# Patient Record
Sex: Male | Born: 1955
Health system: Southern US, Community
[De-identification: ages and names within clinical notes are randomized; demographics above are authoritative.]

## PROBLEM LIST (undated history)

## (undated) DIAGNOSIS — E039 Hypothyroidism, unspecified: Secondary | ICD-10-CM

## (undated) DIAGNOSIS — I499 Cardiac arrhythmia, unspecified: Secondary | ICD-10-CM

## (undated) DIAGNOSIS — R0600 Dyspnea, unspecified: Secondary | ICD-10-CM

## (undated) DIAGNOSIS — C61 Malignant neoplasm of prostate: Secondary | ICD-10-CM

## (undated) HISTORY — DX: Cardiac arrhythmia, unspecified: I49.9

## (undated) HISTORY — PX: LIGAMENT REPAIR: SHX5444

---

## 2000-01-19 ENCOUNTER — Encounter: Admission: RE | Admit: 2000-01-19 | Discharge: 2000-01-19 | Payer: Self-pay | Admitting: Sports Medicine

## 2000-01-22 ENCOUNTER — Encounter: Admission: RE | Admit: 2000-01-22 | Discharge: 2000-01-22 | Payer: Self-pay | Admitting: Family Medicine

## 2006-12-02 ENCOUNTER — Emergency Department (HOSPITAL_COMMUNITY): Admission: EM | Admit: 2006-12-02 | Discharge: 2006-12-02 | Payer: Self-pay | Admitting: Family Medicine

## 2008-07-02 ENCOUNTER — Telehealth: Payer: Self-pay | Admitting: Internal Medicine

## 2010-09-21 ENCOUNTER — Encounter: Payer: Self-pay | Admitting: Emergency Medicine

## 2010-09-30 NOTE — Progress Notes (Signed)
Summary: sleep meds.  Phone Note Call from Patient   Caller: Patient Call For: Dr. Kirtland Bouchard Summary of Call: Pt. wants Rx for sleep called to [facility] [address]......Marland KitchenPain management will not prescribe sleep meds. [phone] Initial call taken by: Lynann Beaver CMA,  July 02, 2008 4:41 PM  Follow-up for Phone Call        Is he our patient? Follow-up by: Gordy Savers  MD,  July 02, 2008 5:24 PM      Appended Document: sleep meds. LMTCB, does not appear to be an active pt, no records found for pt in EMR, no paper chart found either.

## 2014-02-19 ENCOUNTER — Emergency Department (HOSPITAL_COMMUNITY)
Admission: EM | Admit: 2014-02-19 | Discharge: 2014-02-20 | Disposition: A | Discharge status: Home / Self Care | Payer: No Typology Code available for payment source | Attending: Emergency Medicine | Admitting: Emergency Medicine

## 2014-02-19 ENCOUNTER — Encounter (HOSPITAL_COMMUNITY): Payer: Self-pay | Admitting: Emergency Medicine

## 2014-02-19 DIAGNOSIS — S4980XA Other specified injuries of shoulder and upper arm, unspecified arm, initial encounter: Secondary | ICD-10-CM | POA: Insufficient documentation

## 2014-02-19 DIAGNOSIS — S46909A Unspecified injury of unspecified muscle, fascia and tendon at shoulder and upper arm level, unspecified arm, initial encounter: Secondary | ICD-10-CM | POA: Insufficient documentation

## 2014-02-19 DIAGNOSIS — M25511 Pain in right shoulder: Secondary | ICD-10-CM

## 2014-02-19 DIAGNOSIS — Y9389 Activity, other specified: Secondary | ICD-10-CM | POA: Insufficient documentation

## 2014-02-19 DIAGNOSIS — Y92410 Unspecified street and highway as the place of occurrence of the external cause: Secondary | ICD-10-CM | POA: Insufficient documentation

## 2014-02-19 DIAGNOSIS — R52 Pain, unspecified: Secondary | ICD-10-CM | POA: Insufficient documentation

## 2014-02-19 MED ORDER — NAPROXEN 250 MG PO TABS
500.0000 mg | ORAL_TABLET | Freq: Once | ORAL | Status: AC
  Administered 2014-02-20: 500 mg via ORAL
  Filled 2014-02-19: qty 2

## 2014-02-19 MED ORDER — DIAZEPAM 5 MG PO TABS
5.0000 mg | ORAL_TABLET | Freq: Once | ORAL | Status: AC
  Administered 2014-02-20: 5 mg via ORAL
  Filled 2014-02-19: qty 1
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
## 2014-02-20 ENCOUNTER — Emergency Department (HOSPITAL_COMMUNITY): Payer: No Typology Code available for payment source

## 2014-02-20 MED ORDER — DIAZEPAM 5 MG PO TABS: 5.0000 mg | ORAL_TABLET | Freq: Two times a day (BID) | ORAL | Status: DC

## 2014-02-20 MED ORDER — NAPROXEN 500 MG PO TABS: 500.0000 mg | ORAL_TABLET | Freq: Two times a day (BID) | ORAL | Status: DC
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
## 2014-02-20 NOTE — ED Notes (Signed)
Declined W/C at D/C and was escorted to lobby by RN. 

## 2014-02-22 NOTE — ED Provider Notes (Signed)
Medical screening examination/treatment/procedure(s) were performed by non-physician practitioner and as supervising physician I was immediately available for consultation/collaboration.   EKG Interpretation None        Sharyon Cable, MD 02/22/14 [phone]

## 2019-02-19 ENCOUNTER — Other Ambulatory Visit: Payer: Self-pay

## 2019-02-19 ENCOUNTER — Inpatient Hospital Stay (HOSPITAL_COMMUNITY)
Admission: EM | Admit: 2019-02-19 | Discharge: 2019-02-23 | DRG: 029 | Disposition: A | Discharge status: Home / Self Care | Payer: Self-pay | Attending: Neurosurgery | Admitting: Neurosurgery

## 2019-02-19 ENCOUNTER — Inpatient Hospital Stay (HOSPITAL_COMMUNITY): Payer: Self-pay

## 2019-02-19 ENCOUNTER — Emergency Department (HOSPITAL_COMMUNITY): Payer: Self-pay

## 2019-02-19 ENCOUNTER — Encounter (HOSPITAL_COMMUNITY): Payer: Self-pay | Admitting: Emergency Medicine

## 2019-02-19 DIAGNOSIS — R338 Other retention of urine: Secondary | ICD-10-CM | POA: Diagnosis present

## 2019-02-19 DIAGNOSIS — I10 Essential (primary) hypertension: Secondary | ICD-10-CM | POA: Diagnosis present

## 2019-02-19 DIAGNOSIS — Z8042 Family history of malignant neoplasm of prostate: Secondary | ICD-10-CM

## 2019-02-19 DIAGNOSIS — N3289 Other specified disorders of bladder: Secondary | ICD-10-CM | POA: Diagnosis present

## 2019-02-19 DIAGNOSIS — C787 Secondary malignant neoplasm of liver and intrahepatic bile duct: Secondary | ICD-10-CM | POA: Diagnosis present

## 2019-02-19 DIAGNOSIS — R270 Ataxia, unspecified: Secondary | ICD-10-CM | POA: Diagnosis present

## 2019-02-19 DIAGNOSIS — M4802 Spinal stenosis, cervical region: Secondary | ICD-10-CM | POA: Diagnosis present

## 2019-02-19 DIAGNOSIS — R269 Unspecified abnormalities of gait and mobility: Secondary | ICD-10-CM | POA: Diagnosis present

## 2019-02-19 DIAGNOSIS — N189 Chronic kidney disease, unspecified: Secondary | ICD-10-CM

## 2019-02-19 DIAGNOSIS — C7951 Secondary malignant neoplasm of bone: Secondary | ICD-10-CM | POA: Diagnosis present

## 2019-02-19 DIAGNOSIS — N1330 Unspecified hydronephrosis: Secondary | ICD-10-CM | POA: Diagnosis present

## 2019-02-19 DIAGNOSIS — Z1159 Encounter for screening for other viral diseases: Secondary | ICD-10-CM

## 2019-02-19 DIAGNOSIS — D630 Anemia in neoplastic disease: Secondary | ICD-10-CM | POA: Diagnosis present

## 2019-02-19 DIAGNOSIS — Z79899 Other long term (current) drug therapy: Secondary | ICD-10-CM

## 2019-02-19 DIAGNOSIS — C7802 Secondary malignant neoplasm of left lung: Secondary | ICD-10-CM | POA: Diagnosis present

## 2019-02-19 DIAGNOSIS — C61 Malignant neoplasm of prostate: Secondary | ICD-10-CM | POA: Diagnosis present

## 2019-02-19 DIAGNOSIS — D72829 Elevated white blood cell count, unspecified: Secondary | ICD-10-CM | POA: Diagnosis present

## 2019-02-19 DIAGNOSIS — D649 Anemia, unspecified: Secondary | ICD-10-CM | POA: Diagnosis present

## 2019-02-19 DIAGNOSIS — N179 Acute kidney failure, unspecified: Secondary | ICD-10-CM | POA: Diagnosis present

## 2019-02-19 DIAGNOSIS — G893 Neoplasm related pain (acute) (chronic): Secondary | ICD-10-CM | POA: Diagnosis present

## 2019-02-19 DIAGNOSIS — Z419 Encounter for procedure for purposes other than remedying health state, unspecified: Secondary | ICD-10-CM

## 2019-02-19 DIAGNOSIS — G959 Disease of spinal cord, unspecified: Principal | ICD-10-CM | POA: Diagnosis present

## 2019-02-19 HISTORY — DX: Malignant neoplasm of prostate: C61

## 2019-02-19 LAB — DIFFERENTIAL
Abs Immature Granulocytes: 0.02 10*3/uL (ref 0.00–0.07)
Basophils Absolute: 0.1 10*3/uL (ref 0.0–0.1)
Basophils Relative: 1 %
Eosinophils Absolute: 0.4 10*3/uL (ref 0.0–0.5)
Eosinophils Relative: 6 %
Immature Granulocytes: 0 %
Lymphocytes Relative: 22 %
Lymphs Abs: 1.3 10*3/uL (ref 0.7–4.0)
Monocytes Absolute: 0.8 10*3/uL (ref 0.1–1.0)
Monocytes Relative: 13 %
Neutro Abs: 3.4 10*3/uL (ref 1.7–7.7)
Neutrophils Relative %: 58 %

## 2019-02-19 LAB — APTT: aPTT: 31 s (ref 24–36)

## 2019-02-19 LAB — CBC
HCT: 37 % — ABNORMAL LOW (ref 39.0–52.0)
Hemoglobin: 11.7 g/dL — ABNORMAL LOW (ref 13.0–17.0)
MCH: 29.3 pg (ref 26.0–34.0)
MCHC: 31.6 g/dL (ref 30.0–36.0)
MCV: 92.5 fL (ref 80.0–100.0)
Platelets: 222 10*3/uL (ref 150–400)
RBC: 4 MIL/uL — ABNORMAL LOW (ref 4.22–5.81)
RDW: 13.4 % (ref 11.5–15.5)
WBC: 5.9 10*3/uL (ref 4.0–10.5)
nRBC: 0 % (ref 0.0–0.2)

## 2019-02-19 LAB — COMPREHENSIVE METABOLIC PANEL WITH GFR
ALT: 23 U/L (ref 0–44)
AST: 30 U/L (ref 15–41)
Albumin: 3.4 g/dL — ABNORMAL LOW (ref 3.5–5.0)
Alkaline Phosphatase: 455 U/L — ABNORMAL HIGH (ref 38–126)
Anion gap: 9 (ref 5–15)
BUN: 19 mg/dL (ref 8–23)
CO2: 24 mmol/L (ref 22–32)
Calcium: 8.7 mg/dL — ABNORMAL LOW (ref 8.9–10.3)
Chloride: 106 mmol/L (ref 98–111)
Creatinine, Ser: 1.84 mg/dL — ABNORMAL HIGH (ref 0.61–1.24)
GFR calc Af Amer: 45 mL/min — ABNORMAL LOW
GFR calc non Af Amer: 38 mL/min — ABNORMAL LOW
Glucose, Bld: 174 mg/dL — ABNORMAL HIGH (ref 70–99)
Potassium: 3.8 mmol/L (ref 3.5–5.1)
Sodium: 139 mmol/L (ref 135–145)
Total Bilirubin: 0.9 mg/dL (ref 0.3–1.2)
Total Protein: 6.5 g/dL (ref 6.5–8.1)

## 2019-02-19 LAB — SEDIMENTATION RATE: Sed Rate: 32 mm/hr — ABNORMAL HIGH (ref 0–16)

## 2019-02-19 LAB — I-STAT CHEM 8, ED
BUN: 21 mg/dL (ref 8–23)
Calcium, Ion: 1.13 mmol/L — ABNORMAL LOW (ref 1.15–1.40)
Chloride: 106 mmol/L (ref 98–111)
Creatinine, Ser: 1.7 mg/dL — ABNORMAL HIGH (ref 0.61–1.24)
Glucose, Bld: 172 mg/dL — ABNORMAL HIGH (ref 70–99)
HCT: 38 % — ABNORMAL LOW (ref 39.0–52.0)
Hemoglobin: 12.9 g/dL — ABNORMAL LOW (ref 13.0–17.0)
Potassium: 3.8 mmol/L (ref 3.5–5.1)
Sodium: 140 mmol/L (ref 135–145)
TCO2: 27 mmol/L (ref 22–32)

## 2019-02-19 LAB — SARS CORONAVIRUS 2 BY RT PCR (HOSPITAL ORDER, PERFORMED IN ~~LOC~~ HOSPITAL LAB): SARS Coronavirus 2: NEGATIVE

## 2019-02-19 LAB — C-REACTIVE PROTEIN: CRP: 0.9 mg/dL (ref <1.0)

## 2019-02-19 LAB — PSA: Prostatic Specific Antigen: 270 ng/mL — ABNORMAL HIGH (ref 0.00–4.00)

## 2019-02-19 LAB — CBG MONITORING, ED: Glucose-Capillary: 76 mg/dL (ref 70–99)

## 2019-02-19 LAB — PROTIME-INR
INR: 1.2 (ref 0.8–1.2)
Prothrombin Time: 14.9 s (ref 11.4–15.2)

## 2019-02-19 MED ORDER — HYDROCODONE-ACETAMINOPHEN 5-325 MG PO TABS
1.0000 | ORAL_TABLET | ORAL | Status: DC | PRN
  Administered 2019-02-19 – 2019-02-20 (×2): 2 via ORAL
  Filled 2019-02-19: qty 2

## 2019-02-19 MED ORDER — LORAZEPAM 2 MG/ML IJ SOLN
1.0000 mg | Freq: Once | INTRAMUSCULAR | Status: AC
  Administered 2019-02-19: 1 mg via INTRAVENOUS
  Filled 2019-02-19: qty 1

## 2019-02-19 MED ORDER — SODIUM CHLORIDE 0.9 % IV SOLN: 250.0000 mL | INTRAVENOUS | Status: DC | PRN

## 2019-02-19 MED ORDER — ONDANSETRON HCL 4 MG/2ML IJ SOLN: 4.0000 mg | Freq: Four times a day (QID) | INTRAMUSCULAR | Status: DC | PRN

## 2019-02-19 MED ORDER — DEXAMETHASONE SODIUM PHOSPHATE 10 MG/ML IJ SOLN
10.0000 mg | Freq: Four times a day (QID) | INTRAMUSCULAR | Status: DC
  Administered 2019-02-19 – 2019-02-21 (×5): 10 mg via INTRAVENOUS
  Filled 2019-02-19 (×5): qty 1

## 2019-02-19 MED ORDER — SODIUM CHLORIDE 0.9% FLUSH
3.0000 mL | Freq: Two times a day (BID) | INTRAVENOUS | Status: DC
  Administered 2019-02-19: 3 mL via INTRAVENOUS

## 2019-02-19 MED ORDER — ACETAMINOPHEN 650 MG RE SUPP: 650.0000 mg | Freq: Four times a day (QID) | RECTAL | Status: DC | PRN

## 2019-02-19 MED ORDER — SODIUM CHLORIDE 0.9% FLUSH
3.0000 mL | Freq: Once | INTRAVENOUS | Status: AC
  Administered 2019-02-19: 3 mL via INTRAVENOUS

## 2019-02-19 MED ORDER — ACETAMINOPHEN 325 MG PO TABS: 650.0000 mg | ORAL_TABLET | Freq: Four times a day (QID) | ORAL | Status: DC | PRN

## 2019-02-19 MED ORDER — ONDANSETRON HCL 4 MG PO TABS: 4.0000 mg | ORAL_TABLET | Freq: Four times a day (QID) | ORAL | Status: DC | PRN

## 2019-02-19 MED ORDER — SODIUM CHLORIDE 0.9% FLUSH: 3.0000 mL | INTRAVENOUS | Status: DC | PRN

## 2019-02-19 MED ORDER — SODIUM CHLORIDE 0.9 % IV SOLN
INTRAVENOUS | Status: DC
  Administered 2019-02-19 – 2019-02-22 (×4): via INTRAVENOUS

## 2019-02-19 MED ORDER — LORAZEPAM 2 MG/ML IJ SOLN
1.0000 mg | Freq: Once | INTRAMUSCULAR | Status: AC
  Administered 2019-02-19: 10:00:00 1 mg via INTRAVENOUS
  Filled 2019-02-19: qty 1
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
## 2019-02-19 NOTE — ED Triage Notes (Signed)
Ativan 1mg  given per request of Pt. On arrival to Pt bed to give ativan the Pt responded the bar is open fill her up.

## 2019-02-19 NOTE — ED Triage Notes (Signed)
PT reported he needed to pee and sat down on the floor.

## 2019-02-19 NOTE — ED Triage Notes (Signed)
PT found sitting on floor . Pt A/O x4  . Pt assisted into bed.

## 2019-02-19 NOTE — ED Triage Notes (Signed)
PT sleeping soundly and name called multiple times. Pt informed he was going for MRI. Pt reported he needed more medicine for MRI. Pt reminded he was sound asleep and needed his name called for MRI.

## 2019-02-19 NOTE — Progress Notes (Signed)
Attempted to get pt for MRI, pt needs meds for claustrophobia. Rn to call when pt is ready.

## 2019-02-19 NOTE — ED Notes (Signed)
Kelsey RN 3W notified of patient refusing MRI without additional sedation meds. Dr Trenton Gammon paged and patient transported to 3W

## 2019-02-19 NOTE — H&P (Signed)
Chris Morales is an 63 y.o. male.   Chief Complaint: Neck pain and weakness HPI: 63 year old male presents with a 10-day-day history of neck pain.  The symptoms began without any precipitating event.  The patient notes that 3 days ago he began having electrical sensations shooting into his upper extremities left greater than right.  He has had some progressive weakness in his left upper extremity.  He has had difficulty with gait instability.  He has no bowel or bladder dysfunction.  He has no history of malignancy.  He has no other ongoing major medical problems.  History reviewed. No pertinent past medical history.  History reviewed. No pertinent surgical history.  No family history on file. Social History:  reports that he has never smoked. He has never used smokeless tobacco. He reports current alcohol use. He reports that he does not use drugs.  Allergies: No Known Allergies  (Not in a hospital admission)   Results for orders placed or performed during the hospital encounter of 02/19/19 (from the past 48 hour(s))  Protime-INR     Status: None   Collection Time: 02/19/19  7:12 AM  Result Value Ref Range   Prothrombin Time 14.9 11.4 - 15.2 seconds   INR 1.2 0.8 - 1.2    Comment: (NOTE) INR goal varies based on device and disease states. Performed at [facility], [place] [address], [place], [place] [address]   APTT     Status: None   Collection Time: 02/19/19  7:12 AM  Result Value Ref Range   aPTT 31 24 - 36 seconds    Comment: Performed at [facility] [address], [place], [place] [address]  CBC     Status: Abnormal   Collection Time: 02/19/19  7:12 AM  Result Value Ref Range   WBC 5.9 4.0 - 10.5 K/uL   RBC 4.00 (L) 4.22 - 5.81 MIL/uL   Hemoglobin 11.7 (L) 13.0 - 17.0 g/dL   HCT 37.0 (L) 39.0 - 52.0 %   MCV 92.5 80.0 - 100.0 fL   MCH 29.3 26.0 - 34.0 pg   MCHC 31.6 30.0 - 36.0 g/dL   RDW 13.4 11.5 - 15.5 %   Platelets 222 150 - 400 K/uL   nRBC 0.0 0.0 -  0.2 %    Comment: Performed at [facility], [place] [address] [place], [place] [address]  Differential     Status: None   Collection Time: 02/19/19  7:12 AM  Result Value Ref Range   Neutrophils Relative % 58 %   Neutro Abs 3.4 1.7 - 7.7 K/uL   Lymphocytes Relative 22 %   Lymphs Abs 1.3 0.7 - 4.0 K/uL   Monocytes Relative 13 %   Monocytes Absolute 0.8 0.1 - 1.0 K/uL   Eosinophils Relative 6 %   Eosinophils Absolute 0.4 0.0 - 0.5 K/uL   Basophils Relative 1 %   Basophils Absolute 0.1 0.0 - 0.1 K/uL   Immature Granulocytes 0 %   Abs Immature Granulocytes 0.02 0.00 - 0.07 K/uL    Comment: Performed at [facility], [place] [address] [place],  [address]  Comprehensive metabolic panel     Status: Abnormal   Collection Time: 02/19/19  7:12 AM  Result Value Ref Range   Sodium 139 135 - 145 mmol/L   Potassium 3.8 3.5 - 5.1 mmol/L   Chloride 106 98 - 111 mmol/L   CO2 24 22 - 32 mmol/L   Glucose,  Bld 174 (H) 70 - 99 mg/dL   BUN 19 8 - 23 mg/dL   Creatinine, Ser 1.84 (H) 0.61 - 1.24 mg/dL   Calcium 8.7 (L) 8.9 - 10.3 mg/dL   Total Protein 6.5 6.5 - 8.1 g/dL   Albumin 3.4 (L) 3.5 - 5.0 g/dL   AST 30 15 - 41 U/L   ALT 23 0 - 44 U/L   Alkaline Phosphatase 455 (H) 38 - 126 U/L   Total Bilirubin 0.9 0.3 - 1.2 mg/dL   GFR calc non Af Amer 38 (L) >60 mL/min   GFR calc Af Amer 45 (L) >60 mL/min   Anion gap 9 5 - 15    Comment: Performed at [facility] [address], [place], [place] [address]  I-stat chem 8, ED     Status: Abnormal   Collection Time: 02/19/19  7:21 AM  Result Value Ref Range   Sodium 140 135 - 145 mmol/L   Potassium 3.8 3.5 - 5.1 mmol/L   Chloride 106 98 - 111 mmol/L   BUN 21 8 - 23 mg/dL   Creatinine, Ser 1.70 (H) 0.61 - 1.24 mg/dL   Glucose, Bld 172 (H) 70 - 99 mg/dL   Calcium, Ion 1.13 (L) 1.15 - 1.40 mmol/L   TCO2 27 22 - 32 mmol/L   Hemoglobin 12.9 (L) 13.0 - 17.0 g/dL   HCT 38.0 (L) 39.0 - 52.0 %  CBG monitoring, ED     Status: None    Collection Time: 02/19/19 12:06 PM  Result Value Ref Range   Glucose-Capillary 76 70 - 99 mg/dL   Ct Head Wo Contrast  Result Date: 02/19/2019 CLINICAL DATA:  63 year old and possible stroke. EXAM: CT HEAD WITHOUT CONTRAST TECHNIQUE: Contiguous axial images were obtained from the base of the skull through the vertex without intravenous contrast. COMPARISON:  06/18/2014 FINDINGS: Brain: No evidence of acute infarction, hemorrhage, hydrocephalus, extra-axial collection or mass lesion/mass effect. Vascular: No hyperdense vessel or unexpected calcification. Skull: Normal. Negative for fracture or focal lesion. Sinuses/Orbits: Mucosal disease in the right maxillary sinus which is partially imaged. Other: None IMPRESSION: No acute intracranial abnormality. Right maxillary sinus disease. Electronically Signed   By: Markus Daft M.D.   On: 02/19/2019 08:24   Mr Brain Wo Contrast (neuro Protocol)  Result Date: 02/19/2019 CLINICAL DATA:  Focal neuro deficit greater than 6 hours. Suspect stroke. EXAM: MRI HEAD WITHOUT CONTRAST TECHNIQUE: Multiplanar, multiecho pulse sequences of the brain and surrounding structures were obtained without intravenous contrast. COMPARISON:  CT head 02/19/2019 FINDINGS: Brain: Image quality degraded by motion. Negative for acute or chronic infarction. Ventricle size normal. Negative for mass or edema. Negative for hemorrhage or fluid collection. Vascular: Normal arterial flow voids Skull and upper cervical spine: Normal skull. Abnormal bone marrow throughout the C4 vertebral body. C2 and C3 vertebral bodies normal. Sinuses/Orbits: Mucosal edema paranasal sinuses.  Normal orbit Other: None IMPRESSION: Image quality degraded by motion Negative MRI of the brain without contrast Diffusely abnormal bone marrow at C4.  Possible tumor involvement. Electronically Signed   By: Franchot Gallo M.D.   On: 02/19/2019 11:06   Mr Cervical Spine Wo Contrast  Result Date: 02/19/2019 CLINICAL DATA:   Neck pain. Radiculopathy with arm pain and weakness. EXAM: MRI CERVICAL SPINE WITHOUT CONTRAST TECHNIQUE: Multiplanar, multisequence MR imaging of the cervical spine was performed. No intravenous contrast was administered. COMPARISON:  None. FINDINGS: Alignment: Image quality degraded by significant motion. Normal alignment.  Cervical kyphosis. Vertebrae: Negative for  fracture. Diffuse bone marrow abnormality in the cervical spine. The vertebral bodies show low signal on T1 and T2 from C4 through T2 with similar changes in the posterior elements. There is also similar change in the posterior elements but not vertebral body of C3. Cord: Cord evaluation limited by motion however there is possible cord edema throughout the cervical spine from approximately C4 through C6. Posterior Fossa, vertebral arteries, paraspinal tissues: Negative Disc levels: C2-3: Negative C3-4: Mild spinal stenosis. Disc degeneration and diffuse uncinate spurring. Moderate foraminal narrowing bilaterally C4-5: Moderate spinal stenosis. Disc degeneration and spurring. Moderate foraminal stenosis bilaterally C5-6: Disc degeneration and spondylosis. Moderate left foraminal stenosis and mild-to-moderate spinal stenosis C6-7: Disc degeneration and spondylosis. Mild spinal stenosis and mild foraminal stenosis bilaterally C7-T1: Moderate disc degeneration and spurring. Moderate foraminal stenosis bilaterally. Mild spinal stenosis. IMPRESSION: 1. Image quality degraded by significant motion. 2. 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 3. Cord evaluation limited due to motion however there is suggestion of cord edema throughout much of the cervical spine from C4 through C6. This could be due to myelopathy 4. Diffusely abnormal bone marrow throughout the vertebral bodies and posterior elements from C4-T2. While this pattern could be seen due to advanced disc  degeneration, further evaluation for infiltrating neoplastic process such is lymphoma or metastatic disease is warranted. 5. Given the motion degraded study, consider follow-up CT cervical spine. Also, would suggest a follow-up MRI of the cervical spine without with contrast, possibly with sedation, in the near future when the patient is able to hold still. 6. These results were called by telephone at the time of interpretation on 02/19/2019 at 11:20 am to Dr. Charlesetta Shanks , who verbally acknowledged these results. Electronically Signed   By: Franchot Gallo M.D.   On: 02/19/2019 11:22    Pertinent items noted in HPI and remainder of comprehensive ROS otherwise negative.  Blood pressure (!) 151/97, pulse 72, temperature 98.4 F (36.9 C), temperature source Oral, resp. rate 11, height 6\' 1"  (1.854 m), weight 73.9 kg, SpO2 98 %.  The patient is awake and alert.  His affect is somewhat flat and he seems somewhat dissociated from what is going on.  His speech is fluent.  His cranial nerve function is normal bilaterally.  He has significant weakness in both upper extremities left greater than right.  His grip strength is 4-/5 on the left his intrinsic strength is 4-/5 on the left.  He has some proximal weakness involving his deltoid muscle group and biceps muscle group on the left.  He has weakness in both lower extremities with some spasticity.  He has very poor heel shin testing bilaterally.  Sensory examination reveals decreased sensation pinprick and light touch diffusely in both upper extremities around the C5 level.  Reflexes are brisk.  He has Hoffmann's responses in his hands bilaterally.  Toes are upgoing to plantar stimulation.  Examination head ears eyes nose throat is unremarked.  Chest and abdomen are benign appearing.  Extremities are free from injury or deformity. Assessment/Plan Patient has evidence of rather profound cervical myelopathy.  MRI scanning is somewhat motion degraded but the patient  does have evidence of very severe disc degeneration at C3-4, C4-5, C5-6, C6-7 and C7-T1.  There is severe multifactorial stenosis at C3-4, C4-5 and C5-6.  There is an area of high signal in the posterior epidural spaceBehind the body of C5 which is poorly characterized on noncontrasted imaging.  Question possible epidural hematoma versus vascular abnormality versus abscess.  Patient requires further work-up to better characterize what is going on within his cervical spine.  He needs to have an MRI scan with contrast as well as a CT scan of his cervical spine.  We will check inflammatory markers to evaluate for possible epidural abscess/osteomyelitis as there is also signal abnormality within his vertebral bodies.  Mallie Mussel A Savreen Gebhardt 02/19/2019, 1:06 PM

## 2019-02-19 NOTE — ED Triage Notes (Signed)
Pt reports a "crick" in his neck for the last week or more. Pt reports he was moving his head and neck around, lost his balance and caught himself, and then felt a shock and a tingling type pain. Pt reports this pain occurs as well when he is coughing. Pt reports the tingling pain occurs intermittently over his entire body and different times.

## 2019-02-19 NOTE — Progress Notes (Signed)
Patient arrived to 3w. A&O x4. Skin intact with abrasion on back of neck. Tele has been placed. Patient oriented to room with call bell in reach. Patient very unsteady on his feet, bed alarm turned on. Nurse will continue to monitor. [place]

## 2019-02-19 NOTE — ED Provider Notes (Addendum)
[facility] EMERGENCY DEPARTMENT Provider Note   CSN: [id number] Arrival date & time: 02/19/19  9675    History   Chief Complaint No chief complaint on file.   HPI Chris Morales is a 63 y.o. male.     HPI Reports that 1 week ago he woke up and thought he had a crick in his neck on the left side.  The area was somewhat stiff and uncomfortable but at onset he did not have weakness or numbness into his extremities.  He waited several days and then tried some ibuprofen.  About 3 days ago he started to experience a electrical-like sensation that was coming in his arms.  He reports it would happen in both arms but seem to be more so in the left.  Since then he has also begun to have weakness in the left hand.  He reports he cannot grip is strongly.  Also he is noted that there seems to be a slight incoordination or increased awareness of needing to coordinate his gait.  He reports he has been up and walking around without difficulty but it has felt a little bit more incoordinated.  He never had a headache in association with any of these episodes or symptoms.  No blurred vision or double vision.  No periods of confusion.  No periods of difficulty with speech.  No prior history of stroke or spine dysfunction.  No history of spine surgery or known disc herniations.  He reports otherwise he has been well without fevers chills cough or other constitutional type symptoms.  Patient does not smoke.  No drug or alcohol use. History reviewed. No pertinent past medical history.  Patient Active Problem List   Diagnosis Date Noted  . Cervical myelopathy ([place]) 02/19/2019    History reviewed. No pertinent surgical history.      Home Medications    Prior to Admission medications   Medication Sig Start Date End Date Taking? Authorizing Provider  diazepam (VALIUM) 5 MG tablet Take 1 tablet (5 mg total) by mouth 2 (two) times daily. 02/20/14   Cleatrice Burke, PA-C  naproxen (NAPROSYN)  500 MG tablet Take 1 tablet (500 mg total) by mouth 2 (two) times daily with a meal. 02/20/14   Cleatrice Burke, PA-C    Family History No family history on file.  Social History Social History   Tobacco Use  . Smoking status: Never Smoker  . Smokeless tobacco: Never Used  Substance Use Topics  . Alcohol use: Yes  . Drug use: No     Allergies   Patient has no known allergies.   Review of Systems Review of Systems 10 Systems reviewed and are negative for acute change except as noted in the HPI.   Physical Exam Updated Vital Signs BP (!) 151/97   Pulse 72   Temp 98.4 F (36.9 C) (Oral)   Resp 11   Ht 6\' 1"  (1.854 m)   Wt 73.9 kg   SpO2 98%   BMI 21.51 kg/m   Physical Exam Constitutional:      Appearance: Normal appearance. He is well-developed.     Comments: Mental status is clear.  Clinically well in appearance.  Thin but well-nourished and well-developed.  HENT:     Head: Normocephalic and atraumatic.     Nose: Nose normal.     Mouth/Throat:     Mouth: Mucous membranes are moist.     Pharynx: Oropharynx is clear.  Eyes:     Extraocular Movements:  Extraocular movements intact.     Pupils: Pupils are equal, round, and reactive to light.  Neck:     Musculoskeletal: Neck supple.  Cardiovascular:     Rate and Rhythm: Normal rate.     Heart sounds: Normal heart sounds.     Comments: Occasional ectopy.  On monitor consistent with sinus arrhythmia. Pulmonary:     Effort: Pulmonary effort is normal.     Breath sounds: Normal breath sounds.  Abdominal:     General: Bowel sounds are normal. There is no distension.     Palpations: Abdomen is soft.     Tenderness: There is no abdominal tenderness.  Musculoskeletal: Normal range of motion.        General: No swelling or tenderness.     Right lower leg: No edema.     Left lower leg: No edema.  Skin:    General: Skin is warm and dry.  Neurological:     Mental Status: He is alert and oriented to person, place,  and time.     GCS: GCS eye subscore is 4. GCS verbal subscore is 5. GCS motor subscore is 6.     Coordination: Coordination normal.     Comments: Patient has decreased grip strength on the left 3\5 relative to the right 5\5.  Cranial nerves II through XII intact.  Lower extremity strength for elevation and holding against resistance 5\5.  Station intact light touch x4 extremities.  After completion of MRI and results, ambulation attempted.  Patient has severely ataxic gait does not appear steady for any independent ambulation.  Psychiatric:        Mood and Affect: Mood normal.      ED Treatments / Results  Labs (all labs ordered are listed, but only abnormal results are displayed) Labs Reviewed  CBC - Abnormal; Notable for the following components:      Result Value   RBC 4.00 (*)    Hemoglobin 11.7 (*)    HCT 37.0 (*)    All other components within normal limits  COMPREHENSIVE METABOLIC PANEL - Abnormal; Notable for the following components:   Glucose, Bld 174 (*)    Creatinine, Ser 1.84 (*)    Calcium 8.7 (*)    Albumin 3.4 (*)    Alkaline Phosphatase 455 (*)    GFR calc non Af Amer 38 (*)    GFR calc Af Amer 45 (*)    All other components within normal limits  I-STAT CHEM 8, ED - Abnormal; Notable for the following components:   Creatinine, Ser 1.70 (*)    Glucose, Bld 172 (*)    Calcium, Ion 1.13 (*)    Hemoglobin 12.9 (*)    HCT 38.0 (*)    All other components within normal limits  PROTIME-INR  APTT  DIFFERENTIAL  HIV ANTIBODY (ROUTINE TESTING W REFLEX)  SEDIMENTATION RATE  C-REACTIVE PROTEIN  CBG MONITORING, ED  CBG MONITORING, ED    EKG EKG Interpretation  Date/Time:  Sunday February 19 2019 07:07:42 EDT Ventricular Rate:  85 PR Interval:    QRS Duration: 100 QT Interval:  393 QTC Calculation: 462 R Axis:   59 Text Interpretation:  Unknown rhythm, irregular rate Minimal ST elevation, anterior leads Baseline wander in lead(s) V6 sinus arythmia, no ischemic  chnages, no old comparison Confirmed by Charlesetta Shanks [phone]) on 02/19/2019 10:51:13 AM   Radiology Ct Head Wo Contrast  Result Date: 02/19/2019 CLINICAL DATA:  63 year old and possible stroke. EXAM: CT HEAD WITHOUT CONTRAST TECHNIQUE:  Contiguous axial images were obtained from the base of the skull through the vertex without intravenous contrast. COMPARISON:  06/18/2014 FINDINGS: Brain: No evidence of acute infarction, hemorrhage, hydrocephalus, extra-axial collection or mass lesion/mass effect. Vascular: No hyperdense vessel or unexpected calcification. Skull: Normal. Negative for fracture or focal lesion. Sinuses/Orbits: Mucosal disease in the right maxillary sinus which is partially imaged. Other: None IMPRESSION: No acute intracranial abnormality. Right maxillary sinus disease. Electronically Signed   By: Markus Daft M.D.   On: 02/19/2019 08:24   Mr Brain Wo Contrast (neuro Protocol)  Result Date: 02/19/2019 CLINICAL DATA:  Focal neuro deficit greater than 6 hours. Suspect stroke. EXAM: MRI HEAD WITHOUT CONTRAST TECHNIQUE: Multiplanar, multiecho pulse sequences of the brain and surrounding structures were obtained without intravenous contrast. COMPARISON:  CT head 02/19/2019 FINDINGS: Brain: Image quality degraded by motion. Negative for acute or chronic infarction. Ventricle size normal. Negative for mass or edema. Negative for hemorrhage or fluid collection. Vascular: Normal arterial flow voids Skull and upper cervical spine: Normal skull. Abnormal bone marrow throughout the C4 vertebral body. C2 and C3 vertebral bodies normal. Sinuses/Orbits: Mucosal edema paranasal sinuses.  Normal orbit Other: None IMPRESSION: Image quality degraded by motion Negative MRI of the brain without contrast Diffusely abnormal bone marrow at C4.  Possible tumor involvement. Electronically Signed   By: Franchot Gallo M.D.   On: 02/19/2019 11:06   Mr Cervical Spine Wo Contrast  Addendum Date: 02/19/2019   ADDENDUM  REPORT: 02/19/2019 13:07 ADDENDUM: After further review and discussion with Dr. Trenton Gammon, there appears to be a posterior epidural process at the C5 bilaterally. This appears to be contributing to spinal stenosis. This has increased signal on T1 and decreased signal intensity on T2. Possible hemorrhage or abscess. MRI cervical spine without with contrast preferably with sedation would be helpful for further evaluation. Also consider CT cervical spine. Electronically Signed   By: Franchot Gallo M.D.   On: 02/19/2019 13:07   Result Date: 02/19/2019 CLINICAL DATA:  Neck pain. Radiculopathy with arm pain and weakness. EXAM: MRI CERVICAL SPINE WITHOUT CONTRAST TECHNIQUE: Multiplanar, multisequence MR imaging of the cervical spine was performed. No intravenous contrast was administered. COMPARISON:  None. FINDINGS: Alignment: Image quality degraded by significant motion. Normal alignment.  Cervical kyphosis. Vertebrae: Negative for fracture. Diffuse bone marrow abnormality in the cervical spine. The vertebral bodies show low signal on T1 and T2 from C4 through T2 with similar changes in the posterior elements. There is also similar change in the posterior elements but not vertebral body of C3. Cord: Cord evaluation limited by motion however there is possible cord edema throughout the cervical spine from approximately C4 through C6. Posterior Fossa, vertebral arteries, paraspinal tissues: Negative Disc levels: C2-3: Negative C3-4: Mild spinal stenosis. Disc degeneration and diffuse uncinate spurring. Moderate foraminal narrowing bilaterally C4-5: Moderate spinal stenosis. Disc degeneration and spurring. Moderate foraminal stenosis bilaterally C5-6: Disc degeneration and spondylosis. Moderate left foraminal stenosis and mild-to-moderate spinal stenosis C6-7: Disc degeneration and spondylosis. Mild spinal stenosis and mild foraminal stenosis bilaterally C7-T1: Moderate disc degeneration and spurring. Moderate foraminal  stenosis bilaterally. Mild spinal stenosis. IMPRESSION: 1. Image quality degraded by significant motion. 2. 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 3. Cord evaluation limited due to motion however there is suggestion of cord edema throughout much of the cervical spine from C4 through C6. This could be due to myelopathy 4. Diffusely abnormal bone marrow throughout the vertebral bodies and  posterior elements from C4-T2. While this pattern could be seen due to advanced disc degeneration, further evaluation for infiltrating neoplastic process such is lymphoma or metastatic disease is warranted. 5. Given the motion degraded study, consider follow-up CT cervical spine. Also, would suggest a follow-up MRI of the cervical spine without with contrast, possibly with sedation, in the near future when the patient is able to hold still. 6. These results were called by telephone at the time of interpretation on 02/19/2019 at 11:20 am to Dr. Charlesetta Shanks , who verbally acknowledged these results. Electronically Signed: By: Franchot Gallo M.D. On: 02/19/2019 11:22    Procedures Procedures (including critical care time)  Medications Ordered in ED Medications  sodium chloride flush (NS) 0.9 % injection 3 mL (has no administration in time range)  sodium chloride flush (NS) 0.9 % injection 3 mL (has no administration in time range)  sodium chloride flush (NS) 0.9 % injection 3 mL (has no administration in time range)  0.9 %  sodium chloride infusion (has no administration in time range)  acetaminophen (TYLENOL) tablet 650 mg (has no administration in time range)    Or  acetaminophen (TYLENOL) suppository 650 mg (has no administration in time range)  HYDROcodone-acetaminophen (NORCO/VICODIN) 5-325 MG per tablet 1-2 tablet (has no administration in time range)  ondansetron (ZOFRAN) tablet 4 mg (has no administration in time range)     Or  ondansetron (ZOFRAN) injection 4 mg (has no administration in time range)  dexamethasone (DECADRON) injection 10 mg (has no administration in time range)  sodium chloride flush (NS) 0.9 % injection 3 mL (3 mLs Intravenous Given 02/19/19 0828)  LORazepam (ATIVAN) injection 1 mg (1 mg Intravenous Given 02/19/19 0827)  LORazepam (ATIVAN) injection 1 mg (1 mg Intravenous Given 02/19/19 1011)     Initial Impression / Assessment and Plan / ED Course  I have reviewed the triage vital signs and the nursing notes.  Pertinent labs & imaging results that were available during my care of the patient were reviewed by me and considered in my medical decision making (see chart for details).  Clinical Course as of Feb 18 1345  Sun Feb 19, 2019  1212 Consult: Reviewed with Dr. Trenton Gammon neurosurgery will assess in the emergency department.   [MP]  1345 Consult:Dr. Hijazi for medical consultant   [MP]    Clinical Course User Index [MP] Charlesetta Shanks, MD      Patient describes symptoms as being completely new and with onset within the past week.  He is denying weight loss or other incremental symptoms.  Patient is significantly ataxic and MRI shows multiple levels of bone signal abnormality in the cervical spine and per radiology possible cord anomaly and stenosis.  Will consult neurosurgery and plan for admission.  Patient is gait is not stable for independent ambulation.  Dr. Trenton Gammon has evaluated the patient and plans to admit.  Will need further diagnostic evaluation to further clarify etiology of symptoms.  Patient gave history only of acute symptoms as listed in HPI.  He gave a negative review of systems.  At this time however there is significant concern for possible indolent occult conditions.  Dr. Trenton Gammon requests consultation to medical service he will be primary admission.  Final Clinical Impressions(s) / ED Diagnoses   Final diagnoses:  Ataxia  Neurologic gait disorder    ED Discharge  Orders    None       Charlesetta Shanks, MD 02/19/19 1328    Charlesetta Shanks, MD 02/19/19  1346  

## 2019-02-19 NOTE — ED Notes (Signed)
Patient transported to MRI 

## 2019-02-19 NOTE — ED Notes (Signed)
ED TO INPATIENT HANDOFF REPORT  ED Nurse Name and Phone #:   S Name/Age/Gender Chris Morales 63 y.o. male Room/Bed: 037C/037C  Code Status   Code Status: Full Code  Home/SNF/Other Home Patient oriented to: self, place, time and situation Is this baseline? Yes   Triage Complete: Triage complete  Chief Complaint Numbness on left side  Triage Note Pt reports a "crick" in his neck for the last week or more. Pt reports he was moving his head and neck around, lost his balance and caught himself, and then felt a shock and a tingling type pain. Pt reports this pain occurs as well when he is coughing. Pt reports the tingling pain occurs intermittently over his entire body and different times.   PT sleeping soundly and name called multiple times. Pt informed he was going for MRI. Pt reported he needed more medicine for MRI. Pt reminded he was sound asleep and needed his name called for MRI.   Ativan 1mg  given per request of Pt. On arrival to Pt bed to give ativan the Pt responded the bar is open fill her up.  PT found sitting on floor . Pt A/O x4  . Pt assisted into bed.   PT reported he needed to pee and sat down on the floor.   Allergies No Known Allergies  Level of Care/Admitting Diagnosis ED Disposition    ED Disposition Condition [facility]: [place] [100100]  Level of Care: Med-Surg [16]  Covid Evaluation: Screening Protocol (No Symptoms)  Diagnosis: Cervical myelopathy [facility]) [[id number]]  Admitting Physician: [place]  Attending Physician: Earnie Larsson [phone]  Estimated length of stay: 3 - 4 days  Certification:: I certify this patient will need inpatient services for at least 2 midnights  Bed request comments: 3w  PT Class (Do Not Modify): Inpatient [101]  PT Acc Code (Do Not Modify): Private [1]       B Medical/Surgery History History reviewed. No pertinent past medical history. History reviewed. No pertinent  surgical history.   A IV Location/Drains/Wounds Patient Lines/Drains/Airways Status   Active Line/Drains/Airways    Name:   Placement date:   Placement time:   Site:   Days:   Peripheral IV 02/19/19 Left Antecubital   02/19/19    0659    Antecubital   less than 1          Intake/Output Last 24 hours No intake or output data in the 24 hours ending 02/19/19 1608  Labs/Imaging Results for orders placed or performed during the hospital encounter of 02/19/19 (from the past 48 hour(s))  Protime-INR     Status: None   Collection Time: 02/19/19  7:12 AM  Result Value Ref Range   Prothrombin Time 14.9 11.4 - 15.2 seconds   INR 1.2 0.8 - 1.2    Comment: (NOTE) INR goal varies based on device and disease states. Performed at [facility], [place] [address] [place], [place] [address]   APTT     Status: None   Collection Time: 02/19/19  7:12 AM  Result Value Ref Range   aPTT 31 24 - 36 seconds    Comment: Performed at [facility] [address], [place] [address]  CBC     Status: Abnormal   Collection Time: 02/19/19  7:12 AM  Result Value Ref Range   WBC 5.9 4.0 - 10.5 K/uL   RBC 4.00 (L) 4.22 - 5.81 MIL/uL  Hemoglobin 11.7 (L) 13.0 - 17.0 g/dL   HCT 37.0 (L) 39.0 - 52.0 %   MCV 92.5 80.0 - 100.0 fL   MCH 29.3 26.0 - 34.0 pg   MCHC 31.6 30.0 - 36.0 g/dL   RDW 13.4 11.5 - 15.5 %   Platelets 222 150 - 400 K/uL   nRBC 0.0 0.0 - 0.2 %    Comment: Performed at [facility] [address] [place], [place] [address]  Differential     Status: None   Collection Time: 02/19/19  7:12 AM  Result Value Ref Range   Neutrophils Relative % 58 %   Neutro Abs 3.4 1.7 - 7.7 K/uL   Lymphocytes Relative 22 %   Lymphs Abs 1.3 0.7 - 4.0 K/uL   Monocytes Relative 13 %   Monocytes Absolute 0.8 0.1 - 1.0 K/uL   Eosinophils Relative 6 %   Eosinophils Absolute 0.4 0.0 - 0.5 K/uL   Basophils Relative 1 %   Basophils Absolute 0.1 0.0 - 0.1 K/uL   Immature Granulocytes 0  %   Abs Immature Granulocytes 0.02 0.00 - 0.07 K/uL    Comment: Performed at [facility], [place] [address], [place], [place] [address]  Comprehensive metabolic panel     Status: Abnormal   Collection Time: 02/19/19  7:12 AM  Result Value Ref Range   Sodium 139 135 - 145 mmol/L   Potassium 3.8 3.5 - 5.1 mmol/L   Chloride 106 98 - 111 mmol/L   CO2 24 22 - 32 mmol/L   Glucose, Bld 174 (H) 70 - 99 mg/dL   BUN 19 8 - 23 mg/dL   Creatinine, Ser 1.84 (H) 0.61 - 1.24 mg/dL   Calcium 8.7 (L) 8.9 - 10.3 mg/dL   Total Protein 6.5 6.5 - 8.1 g/dL   Albumin 3.4 (L) 3.5 - 5.0 g/dL   AST 30 15 - 41 U/L   ALT 23 0 - 44 U/L   Alkaline Phosphatase 455 (H) 38 - 126 U/L   Total Bilirubin 0.9 0.3 - 1.2 mg/dL   GFR calc non Af Amer 38 (L) >60 mL/min   GFR calc Af Amer 45 (L) >60 mL/min   Anion gap 9 5 - 15    Comment: Performed at [facility] [address] [place], [place] [address]  I-stat chem 8, ED     Status: Abnormal   Collection Time: 02/19/19  7:21 AM  Result Value Ref Range   Sodium 140 135 - 145 mmol/L   Potassium 3.8 3.5 - 5.1 mmol/L   Chloride 106 98 - 111 mmol/L   BUN 21 8 - 23 mg/dL   Creatinine, Ser 1.70 (H) 0.61 - 1.24 mg/dL   Glucose, Bld 172 (H) 70 - 99 mg/dL   Calcium, Ion 1.13 (L) 1.15 - 1.40 mmol/L   TCO2 27 22 - 32 mmol/L   Hemoglobin 12.9 (L) 13.0 - 17.0 g/dL   HCT 38.0 (L) 39.0 - 52.0 %  CBG monitoring, ED     Status: None   Collection Time: 02/19/19 12:06 PM  Result Value Ref Range   Glucose-Capillary 76 70 - 99 mg/dL   Dg Chest 2 View  Result Date: 02/19/2019 CLINICAL DATA:  Chest pain. EXAM: CHEST - 2 VIEW COMPARISON:  None. FINDINGS: The cardiac silhouette, mediastinal and hilar contours are within normal limits. The lungs are clear of acute process. No infiltrates, edema or effusions. Rounded density in the left lower chest or  could reflect a nipple shadow. A follow-up PA chest film with nipple markers may be helpful to exclude a pulmonary nodule. The  bony thorax is intact. IMPRESSION: 1. No acute cardiopulmonary findings. 2. Nodular density in the left lower lobe could reflect a nipple shadow or overlapping bony and vascular markings. A repeat PA chest film with nipple markers may be helpful when able, for further evaluation. Electronically Signed   By: Marijo Sanes M.D.   On: 02/19/2019 14:45   Ct Head Wo Contrast  Result Date: 02/19/2019 CLINICAL DATA:  63 year old and possible stroke. EXAM: CT HEAD WITHOUT CONTRAST TECHNIQUE: Contiguous axial images were obtained from the base of the skull through the vertex without intravenous contrast. COMPARISON:  06/18/2014 FINDINGS: Brain: No evidence of acute infarction, hemorrhage, hydrocephalus, extra-axial collection or mass lesion/mass effect. Vascular: No hyperdense vessel or unexpected calcification. Skull: Normal. Negative for fracture or focal lesion. Sinuses/Orbits: Mucosal disease in the right maxillary sinus which is partially imaged. Other: None IMPRESSION: No acute intracranial abnormality. Right maxillary sinus disease. Electronically Signed   By: Markus Daft M.D.   On: 02/19/2019 08:24   Ct Cervical Spine Wo Contrast  Result Date: 02/19/2019 CLINICAL DATA:  C-spine trauma. Myelopathy.  Abnormal MRI. EXAM: CT CERVICAL SPINE WITHOUT CONTRAST TECHNIQUE: Multidetector CT imaging of the cervical spine was performed without intravenous contrast. Multiplanar CT image reconstructions were also generated. COMPARISON:  MRI cervical spine 02/19/2019 FINDINGS: Alignment: Reversal of the normal cervical lordotic curve. Skull base and vertebrae: Abnormal sclerosis of the cervical vertebrae, C4 through C7, and the visualized thoracic vertebrae, T1 and T2, also involving the facets and other posterior elements, slight remodeling with loss of height particularly at C5 and C6. Concern for metastatic disease. Soft tissues and spinal canal: The spinal canal is poorly visualized on this exam. However, correlating with  MRI, and given the appearance of the vertebral bodies, posterior epidural tumor at C5 is a differential concern. Disc levels: Near complete loss of interspace height from C4 through C7-T1. This results in varying degrees of foraminal narrowing. Upper chest: Negative. Other: None. IMPRESSION: 1. Abnormal sclerosis of the cervical vertebrae, and visualized upper thoracic vertebrae, with slight remodeling and loss of height particularly at C5 and C6. Concern for metastatic disease, specifically prostate cancer in a male. Less common considerations would include transitional cell carcinoma, mucinous adenocarcinomas, or lymphoma. 2. The spinal canal is poorly visualized on this exam, but given the appearance of the vertebral bodies, posterior epidural tumor at C5 is a differential consideration, along with hemorrhage or infection. 3. Consider MRI with contrast for further evaluation, to evaluate the posterior epidural space. As the noncontrast study was considerably degraded, consider premedication with pain medication or anxiolytics. Electronically Signed   By: Staci Righter M.D.   On: 02/19/2019 15:25   Mr Brain Wo Contrast (neuro Protocol)  Result Date: 02/19/2019 CLINICAL DATA:  Focal neuro deficit greater than 6 hours. Suspect stroke. EXAM: MRI HEAD WITHOUT CONTRAST TECHNIQUE: Multiplanar, multiecho pulse sequences of the brain and surrounding structures were obtained without intravenous contrast. COMPARISON:  CT head 02/19/2019 FINDINGS: Brain: Image quality degraded by motion. Negative for acute or chronic infarction. Ventricle size normal. Negative for mass or edema. Negative for hemorrhage or fluid collection. Vascular: Normal arterial flow voids Skull and upper cervical spine: Normal skull. Abnormal bone marrow throughout the C4 vertebral body. C2 and C3 vertebral bodies normal. Sinuses/Orbits: Mucosal edema paranasal sinuses.  Normal orbit Other: None IMPRESSION: Image quality degraded by motion Negative  MRI of the brain without contrast Diffusely abnormal bone marrow at C4.  Possible tumor involvement. Electronically Signed   By: Franchot Gallo M.D.   On: 02/19/2019 11:06   Mr Cervical Spine Wo Contrast  Addendum Date: 02/19/2019   ADDENDUM REPORT: 02/19/2019 13:07 ADDENDUM: After further review and discussion with Dr. Trenton Gammon, there appears to be a posterior epidural process at the C5 bilaterally. This appears to be contributing to spinal stenosis. This has increased signal on T1 and decreased signal intensity on T2. Possible hemorrhage or abscess. MRI cervical spine without with contrast preferably with sedation would be helpful for further evaluation. Also consider CT cervical spine. Electronically Signed   By: Franchot Gallo M.D.   On: 02/19/2019 13:07   Result Date: 02/19/2019 CLINICAL DATA:  Neck pain. Radiculopathy with arm pain and weakness. EXAM: MRI CERVICAL SPINE WITHOUT CONTRAST TECHNIQUE: Multiplanar, multisequence MR imaging of the cervical spine was performed. No intravenous contrast was administered. COMPARISON:  None. FINDINGS: Alignment: Image quality degraded by significant motion. Normal alignment.  Cervical kyphosis. Vertebrae: Negative for fracture. Diffuse bone marrow abnormality in the cervical spine. The vertebral bodies show low signal on T1 and T2 from C4 through T2 with similar changes in the posterior elements. There is also similar change in the posterior elements but not vertebral body of C3. Cord: Cord evaluation limited by motion however there is possible cord edema throughout the cervical spine from approximately C4 through C6. Posterior Fossa, vertebral arteries, paraspinal tissues: Negative Disc levels: C2-3: Negative C3-4: Mild spinal stenosis. Disc degeneration and diffuse uncinate spurring. Moderate foraminal narrowing bilaterally C4-5: Moderate spinal stenosis. Disc degeneration and spurring. Moderate foraminal stenosis bilaterally C5-6: Disc degeneration and spondylosis.  Moderate left foraminal stenosis and mild-to-moderate spinal stenosis C6-7: Disc degeneration and spondylosis. Mild spinal stenosis and mild foraminal stenosis bilaterally C7-T1: Moderate disc degeneration and spurring. Moderate foraminal stenosis bilaterally. Mild spinal stenosis. IMPRESSION: 1. Image quality degraded by significant motion. 2. 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 3. Cord evaluation limited due to motion however there is suggestion of cord edema throughout much of the cervical spine from C4 through C6. This could be due to myelopathy 4. Diffusely abnormal bone marrow throughout the vertebral bodies and posterior elements from C4-T2. While this pattern could be seen due to advanced disc degeneration, further evaluation for infiltrating neoplastic process such is lymphoma or metastatic disease is warranted. 5. Given the motion degraded study, consider follow-up CT cervical spine. Also, would suggest a follow-up MRI of the cervical spine without with contrast, possibly with sedation, in the near future when the patient is able to hold still. 6. These results were called by telephone at the time of interpretation on 02/19/2019 at 11:20 am to Dr. Charlesetta Shanks , who verbally acknowledged these results. Electronically Signed: By: Franchot Gallo M.D. On: 02/19/2019 11:22    Pending Labs Unresulted Labs (From admission, onward)    Start     Ordered   02/19/19 1324  Sedimentation rate  Once,   STAT     02/19/19 1327   02/19/19 1324  C-reactive protein  Once,   STAT     02/19/19 1327   02/19/19 1319  HIV antibody (Routine Testing)  Once,   STAT     02/19/19 1327   Signed and Held  Immunoglobulins, QN, A/E/G/M  Once,   R     Signed and Held   Signed and Held  Protein  electrophoresis, serum  Once,   R     Signed and Held   Signed and Held  Basic metabolic panel  Tomorrow morning,   R     Signed and  Held   Signed and Held  PSA  Once,   R     Signed and Held          Vitals/Pain Today's Vitals   02/19/19 1345 02/19/19 1500 02/19/19 1520 02/19/19 1604  BP: (!) 148/88 (!) 156/102 (!) 151/91 120/68  Pulse: 61 74  (!) 51  Resp:      Temp:      TempSrc:      SpO2: 100% 100%  96%  Weight:      Height:      PainSc:        Isolation Precautions No active isolations  Medications Medications  sodium chloride flush (NS) 0.9 % injection 3 mL (3 mLs Intravenous Not Given 02/19/19 1531)  sodium chloride flush (NS) 0.9 % injection 3 mL (3 mLs Intravenous Not Given 02/19/19 1531)  sodium chloride flush (NS) 0.9 % injection 3 mL (has no administration in time range)  0.9 %  sodium chloride infusion (has no administration in time range)  acetaminophen (TYLENOL) tablet 650 mg (has no administration in time range)    Or  acetaminophen (TYLENOL) suppository 650 mg (has no administration in time range)  HYDROcodone-acetaminophen (NORCO/VICODIN) 5-325 MG per tablet 1-2 tablet (has no administration in time range)  ondansetron (ZOFRAN) tablet 4 mg (has no administration in time range)    Or  ondansetron (ZOFRAN) injection 4 mg (has no administration in time range)  dexamethasone (DECADRON) injection 10 mg (10 mg Intravenous Given 02/19/19 1517)  sodium chloride flush (NS) 0.9 % injection 3 mL (3 mLs Intravenous Given 02/19/19 0828)  LORazepam (ATIVAN) injection 1 mg (1 mg Intravenous Given 02/19/19 0827)  LORazepam (ATIVAN) injection 1 mg (1 mg Intravenous Given 02/19/19 1011)    Mobility walks with person assist High fall risk   Focused Assessments Neuro Assessment Handoff:  Swallow screen pass? Yes    NIH Stroke Scale ( + Modified Stroke Scale Criteria)  Interval: Initial Level of Consciousness (1a.)   : Alert, keenly responsive LOC Questions (1b. )   +: Answers both questions correctly LOC Commands (1c. )   + : Performs both tasks correctly Best Gaze (2. )  +: Normal Visual (3. )   +: No visual loss Facial Palsy (4. )    : Normal symmetrical movements Motor Arm, Left (5a. )   +: Drift Motor Arm, Right (5b. )   +: No drift Motor Leg, Left (6a. )   +: No drift Motor Leg, Right (6b. )   +: No drift Limb Ataxia (7. ): Absent Sensory (8. )   +: Normal, no sensory loss Best Language (9. )   +: No aphasia Dysarthria (10. ): Normal Extinction/Inattention (11.)   +: No Abnormality Modified SS Total  +: 1 Complete NIHSS TOTAL: 1 Last date known well: 02/12/19 Last time known well: 0800 Neuro Assessment: Exceptions to WDL Neuro Checks:   Initial (02/19/19 0709)  Last Documented NIHSS Modified Score: 1 (02/19/19 1601) Has TPA been given? No If patient is a Neuro Trauma and patient is going to OR before floor call report to [facility] [place] nurse: [phone] or [phone]     R Recommendations: See Admitting Provider Note  Report given to:   Additional Notes:

## 2019-02-19 NOTE — H&P (Addendum)
[facility]                                                                                    Patient Demographics  Larnell Granlund, is a 63 y.o. male  CSN: [id number]  MRN: [id number]  DOB - 19-May-1956  Admit Date - 02/19/2019  Outpatient Primary MD for the patient is Patient, No Pcp Per   With no significant past medical history   in for   Upper extremity weakness  HPI  Phuong Moffatt  is a 63 y.o. male, no past medical history presenting today with progressive weakness in his upper extremities left more than right and gait instability , his grips have become weaker.  Patient resides in [place] and works as a Pharmacist, community.  No new headaches, visual problems, speech problems no urinary or stool incontinence.  His history started around 1 week ago after he wake up and felt a crick in his neck with electrical-like sensations coming in his arms. Patient denies any chest pain, fever, chills, nausea or vomiting Work-up in the emergency room revealed profound cervical myelopathy especially at the level of C5 with increased T1 and decrease T2 signal intensity suggestive of possible hemorrhage or abscess. His creatinine was elevated. Patient's only complaint is some dysuria and he thinks he might have some prostate problems.  His CBC was benign Case discussed with Dr. [name] from oncology and Dr. Malen Gauze from neurology.    Review of Systems    In addition to the HPI above, No Fever-chills, No Headache, No changes with Vision or hearing, No problems swallowing food or Liquids, No Chest pain, Cough or Shortness of Breath, No Abdominal pain, No Nausea or Vommitting, Bowel movements are regular, No Blood in stool or Urine, No new skin rashes or bruises, No new joints pains-aches,  No recent weight gain or loss, No polyuria, polydypsia or polyphagia, No significant Mental Stressors.  A full 10 point Review of Systems was done, except as stated above, all other Review of  Systems were negative.   Social History Social History   Tobacco Use  . Smoking status: Never Smoker  . Smokeless tobacco: Never Used  Substance Use Topics  . Alcohol use: Yes     Family History Significant for pancreatic cancer  Prior to Admission medications   Medication Sig Start Date End Date Taking? Authorizing Provider  diazepam (VALIUM) 5 MG tablet Take 1 tablet (5 mg total) by mouth 2 (two) times daily. 02/20/14   Cleatrice Burke, PA-C  naproxen (NAPROSYN) 500 MG tablet Take 1 tablet (500 mg total) by mouth 2 (two) times daily with a meal. 02/20/14   Cleatrice Burke, PA-C    No Known Allergies  Physical Exam  Vitals  Blood pressure (!) 151/97, pulse 72, temperature 98.4 F (36.9 C), temperature source Oral, resp. rate 11, height 6' 1"  (1.854 m), weight 73.9 kg, SpO2 98 %.   General appearance, well-developed male in no acute distress, very pleasant HEENT no jaundice or pallor, no facial deviation oral thrush Neck supple, no neck vein distention Chest clear and resonant  Heart irregular rhythm, normal S1-S2 Abdomen soft, nontender, bowel sounds present Extremities  no clubbing cyanosis or edema Neuro upper extremity weakness noted left more than right            Normal sensations Skin: No rashes or ulcers  Data Review  CBC Recent Labs  Lab 02/19/19 0712 02/19/19 0721  WBC 5.9  --   HGB 11.7* 12.9*  HCT 37.0* 38.0*  PLT 222  --   MCV 92.5  --   MCH 29.3  --   MCHC 31.6  --   RDW 13.4  --   LYMPHSABS 1.3  --   MONOABS 0.8  --   EOSABS 0.4  --   BASOSABS 0.1  --    ------------------------------------------------------------------------------------------------------------------  Chemistries  Recent Labs  Lab 02/19/19 0712 02/19/19 0721  NA 139 140  K 3.8 3.8  CL 106 106  CO2 24  --   GLUCOSE 174* 172*  BUN 19 21  CREATININE 1.84* 1.70*  CALCIUM 8.7*  --   AST 30  --   ALT 23  --   ALKPHOS 455*  --   BILITOT 0.9  --     ------------------------------------------------------------------------------------------------------------------ estimated creatinine clearance is 47.1 mL/min (A) (by C-G formula based on SCr of 1.7 mg/dL (H)). ------------------------------------------------------------------------------------------------------------------ No results for input(s): TSH, T4TOTAL, T3FREE, THYROIDAB in the last 72 hours.  Invalid input(s): FREET3   Coagulation profile Recent Labs  Lab 02/19/19 0712  INR 1.2   ------------------------------------------------------------------------------------------------------------------- No results for input(s): DDIMER in the last 72 hours. -------------------------------------------------------------------------------------------------------------------  Cardiac Enzymes No results for input(s): CKMB, TROPONINI, MYOGLOBIN in the last 168 hours.  Invalid input(s): CK ------------------------------------------------------------------------------------------------------------------ Invalid input(s): POCBNP   ---------------------------------------------------------------------------------------------------------------  Urinalysis No results found for: COLORURINE, APPEARANCEUR, LABSPEC, PHURINE, GLUCOSEU, HGBUR, BILIRUBINUR, KETONESUR, PROTEINUR, UROBILINOGEN, NITRITE, LEUKOCYTESUR  ----------------------------------------------------------------------------------------------------------------   Imaging results:   Ct Head Wo Contrast  Result Date: 02/19/2019 CLINICAL DATA:  62 year old and possible stroke. EXAM: CT HEAD WITHOUT CONTRAST TECHNIQUE: Contiguous axial images were obtained from the base of the skull through the vertex without intravenous contrast. COMPARISON:  06/18/2014 FINDINGS: Brain: No evidence of acute infarction, hemorrhage, hydrocephalus, extra-axial collection or mass lesion/mass effect. Vascular: No hyperdense vessel or unexpected  calcification. Skull: Normal. Negative for fracture or focal lesion. Sinuses/Orbits: Mucosal disease in the right maxillary sinus which is partially imaged. Other: None IMPRESSION: No acute intracranial abnormality. Right maxillary sinus disease. Electronically Signed   By: Markus Daft M.D.   On: 02/19/2019 08:24   Mr Brain Wo Contrast (neuro Protocol)  Result Date: 02/19/2019 CLINICAL DATA:  Focal neuro deficit greater than 6 hours. Suspect stroke. EXAM: MRI HEAD WITHOUT CONTRAST TECHNIQUE: Multiplanar, multiecho pulse sequences of the brain and surrounding structures were obtained without intravenous contrast. COMPARISON:  CT head 02/19/2019 FINDINGS: Brain: Image quality degraded by motion. Negative for acute or chronic infarction. Ventricle size normal. Negative for mass or edema. Negative for hemorrhage or fluid collection. Vascular: Normal arterial flow voids Skull and upper cervical spine: Normal skull. Abnormal bone marrow throughout the C4 vertebral body. C2 and C3 vertebral bodies normal. Sinuses/Orbits: Mucosal edema paranasal sinuses.  Normal orbit Other: None IMPRESSION: Image quality degraded by motion Negative MRI of the brain without contrast Diffusely abnormal bone marrow at C4.  Possible tumor involvement. Electronically Signed   By: Franchot Gallo M.D.   On: 02/19/2019 11:06   Mr Cervical Spine Wo Contrast  Addendum Date: 02/19/2019   ADDENDUM REPORT: 02/19/2019 13:07 ADDENDUM: After further review and discussion with Dr. Trenton Gammon, there appears to be a posterior epidural process at the  C5 bilaterally. This appears to be contributing to spinal stenosis. This has increased signal on T1 and decreased signal intensity on T2. Possible hemorrhage or abscess. MRI cervical spine without with contrast preferably with sedation would be helpful for further evaluation. Also consider CT cervical spine. Electronically Signed   By: Franchot Gallo M.D.   On: 02/19/2019 13:07   Result Date:  02/19/2019 CLINICAL DATA:  Neck pain. Radiculopathy with arm pain and weakness. EXAM: MRI CERVICAL SPINE WITHOUT CONTRAST TECHNIQUE: Multiplanar, multisequence MR imaging of the cervical spine was performed. No intravenous contrast was administered. COMPARISON:  None. FINDINGS: Alignment: Image quality degraded by significant motion. Normal alignment.  Cervical kyphosis. Vertebrae: Negative for fracture. Diffuse bone marrow abnormality in the cervical spine. The vertebral bodies show low signal on T1 and T2 from C4 through T2 with similar changes in the posterior elements. There is also similar change in the posterior elements but not vertebral body of C3. Cord: Cord evaluation limited by motion however there is possible cord edema throughout the cervical spine from approximately C4 through C6. Posterior Fossa, vertebral arteries, paraspinal tissues: Negative Disc levels: C2-3: Negative C3-4: Mild spinal stenosis. Disc degeneration and diffuse uncinate spurring. Moderate foraminal narrowing bilaterally C4-5: Moderate spinal stenosis. Disc degeneration and spurring. Moderate foraminal stenosis bilaterally C5-6: Disc degeneration and spondylosis. Moderate left foraminal stenosis and mild-to-moderate spinal stenosis C6-7: Disc degeneration and spondylosis. Mild spinal stenosis and mild foraminal stenosis bilaterally C7-T1: Moderate disc degeneration and spurring. Moderate foraminal stenosis bilaterally. Mild spinal stenosis. IMPRESSION: 1. Image quality degraded by significant motion. 2. 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 3. Cord evaluation limited due to motion however there is suggestion of cord edema throughout much of the cervical spine from C4 through C6. This could be due to myelopathy 4. Diffusely abnormal bone marrow throughout the vertebral bodies and posterior elements from C4-T2. While this pattern could be seen  due to advanced disc degeneration, further evaluation for infiltrating neoplastic process such is lymphoma or metastatic disease is warranted. 5. Given the motion degraded study, consider follow-up CT cervical spine. Also, would suggest a follow-up MRI of the cervical spine without with contrast, possibly with sedation, in the near future when the patient is able to hold still. 6. These results were called by telephone at the time of interpretation on 02/19/2019 at 11:20 am to Dr. Charlesetta Shanks , who verbally acknowledged these results. Electronically Signed: By: Franchot Gallo M.D. 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  Acute renal failure stage III IV fluids  Plan Discussed case informally with neurology(Dr. Malen Gauze) and oncology(Dr. Osker Mason) Awaiting discussion with Dr. Annette Stable , CT of abdomen and pelvis will be done to rule out multiple myeloma or any other solid tumor Quantitative Ig MRI cervical spine with contrast CT spine We will give IV fluids Consider neurology consult in a.m. Check PSA  DVT Prophylaxis SCDs  AM Labs Ordered, also please review Full Orders    Code Status full  Disposition Plan: Home  Time spent in minutes : 72 minutes  Condition GUARDED  I can be contacted at [phone]  [email or web address] @

## 2019-02-19 NOTE — ED Notes (Signed)
Nurse starting IV and drawing labs. 

## 2019-02-20 ENCOUNTER — Encounter (HOSPITAL_COMMUNITY): Payer: Self-pay

## 2019-02-20 ENCOUNTER — Inpatient Hospital Stay (HOSPITAL_COMMUNITY): Payer: Self-pay | Admitting: Anesthesiology

## 2019-02-20 ENCOUNTER — Inpatient Hospital Stay (HOSPITAL_COMMUNITY): Payer: Self-pay

## 2019-02-20 ENCOUNTER — Encounter (HOSPITAL_COMMUNITY)
Admission: EM | Disposition: A | Discharge status: Home / Self Care | Payer: Self-pay | Source: Home / Self Care | Attending: Neurosurgery

## 2019-02-20 DIAGNOSIS — D649 Anemia, unspecified: Secondary | ICD-10-CM

## 2019-02-20 DIAGNOSIS — C61 Malignant neoplasm of prostate: Secondary | ICD-10-CM | POA: Diagnosis present

## 2019-02-20 DIAGNOSIS — N179 Acute kidney failure, unspecified: Secondary | ICD-10-CM | POA: Diagnosis present

## 2019-02-20 DIAGNOSIS — N1330 Unspecified hydronephrosis: Secondary | ICD-10-CM

## 2019-02-20 HISTORY — PX: LAMINECTOMY: SHX219

## 2019-02-20 LAB — RETICULOCYTES
Immature Retic Fract: 8.2 % (ref 2.3–15.9)
RBC.: 4.4 MIL/uL (ref 4.22–5.81)
Retic Count, Absolute: 57.1 10*3/uL (ref 19.0–186.0)
Retic Ct Pct: 1.3 % (ref 0.4–3.1)

## 2019-02-20 LAB — TYPE AND SCREEN
ABO/RH(D): O POS
Antibody Screen: NEGATIVE

## 2019-02-20 LAB — ABO/RH: ABO/RH(D): O POS

## 2019-02-20 LAB — BASIC METABOLIC PANEL
Anion gap: 10 (ref 5–15)
BUN: 20 mg/dL (ref 8–23)
CO2: 22 mmol/L (ref 22–32)
Calcium: 8.6 mg/dL — ABNORMAL LOW (ref 8.9–10.3)
Chloride: 106 mmol/L (ref 98–111)
Creatinine, Ser: 1.57 mg/dL — ABNORMAL HIGH (ref 0.61–1.24)
GFR calc Af Amer: 54 mL/min — ABNORMAL LOW (ref >60)
GFR calc non Af Amer: 47 mL/min — ABNORMAL LOW (ref >60)
Glucose, Bld: 152 mg/dL — ABNORMAL HIGH (ref 70–99)
Potassium: 4.3 mmol/L (ref 3.5–5.1)
Sodium: 138 mmol/L (ref 135–145)

## 2019-02-20 LAB — IRON AND TIBC
Iron: 60 ug/dL (ref 45–182)
Saturation Ratios: 24 % (ref 17.9–39.5)
TIBC: 248 ug/dL — ABNORMAL LOW (ref 250–450)
UIBC: 188 ug/dL

## 2019-02-20 LAB — FERRITIN: Ferritin: 590 ng/mL — ABNORMAL HIGH (ref 24–336)

## 2019-02-20 LAB — VITAMIN B12: Vitamin B-12: 595 pg/mL (ref 180–914)

## 2019-02-20 LAB — HIV ANTIBODY (ROUTINE TESTING W REFLEX): HIV Screen 4th Generation wRfx: NONREACTIVE

## 2019-02-20 LAB — FOLATE: Folate: 12.2 ng/mL (ref >5.9)

## 2019-02-20 LAB — SURGICAL PCR SCREEN
MRSA, PCR: POSITIVE — AB
Staphylococcus aureus: POSITIVE — AB

## 2019-02-20 SURGERY — CERVICAL LAMINECTOMY FOR TUMOR
Anesthesia: General | Site: Spine Cervical | Laterality: Bilateral

## 2019-02-20 MED ORDER — POLYETHYLENE GLYCOL 3350 17 G PO PACK: 17.0000 g | PACK | Freq: Every day | ORAL | Status: DC | PRN

## 2019-02-20 MED ORDER — FENTANYL CITRATE (PF) 250 MCG/5ML IJ SOLN
INTRAMUSCULAR | Status: DC | PRN
  Administered 2019-02-20: 50 ug via INTRAVENOUS
  Administered 2019-02-20: 100 ug via INTRAVENOUS
  Administered 2019-02-20: 50 ug via INTRAVENOUS

## 2019-02-20 MED ORDER — SODIUM CHLORIDE 0.9 % IV SOLN
INTRAVENOUS | Status: DC | PRN
  Administered 2019-02-20: 500 mL

## 2019-02-20 MED ORDER — ONDANSETRON HCL 4 MG/2ML IJ SOLN
INTRAMUSCULAR | Status: AC
  Filled 2019-02-20: qty 2

## 2019-02-20 MED ORDER — FLEET ENEMA 7-19 GM/118ML RE ENEM: 1.0000 | ENEMA | Freq: Once | RECTAL | Status: DC | PRN

## 2019-02-20 MED ORDER — LIDOCAINE 2% (20 MG/ML) 5 ML SYRINGE
INTRAMUSCULAR | Status: DC | PRN
  Administered 2019-02-20: 100 mg via INTRAVENOUS

## 2019-02-20 MED ORDER — PHENYLEPHRINE 40 MCG/ML (10ML) SYRINGE FOR IV PUSH (FOR BLOOD PRESSURE SUPPORT)
PREFILLED_SYRINGE | INTRAVENOUS | Status: DC | PRN
  Administered 2019-02-20: 120 ug via INTRAVENOUS

## 2019-02-20 MED ORDER — PROMETHAZINE HCL 25 MG/ML IJ SOLN: 6.2500 mg | INTRAMUSCULAR | Status: DC | PRN

## 2019-02-20 MED ORDER — LACTATED RINGERS IV SOLN
INTRAVENOUS | Status: DC
  Administered 2019-02-20: 14:00:00 via INTRAVENOUS

## 2019-02-20 MED ORDER — HYDROMORPHONE HCL 1 MG/ML IJ SOLN: 1.0000 mg | INTRAMUSCULAR | Status: DC | PRN

## 2019-02-20 MED ORDER — LACTATED RINGERS IV SOLN
INTRAVENOUS | Status: DC | PRN
  Administered 2019-02-20: 14:00:00 via INTRAVENOUS

## 2019-02-20 MED ORDER — SUCCINYLCHOLINE CHLORIDE 200 MG/10ML IV SOSY
PREFILLED_SYRINGE | INTRAVENOUS | Status: AC
  Filled 2019-02-20: qty 10

## 2019-02-20 MED ORDER — THROMBIN 5000 UNITS EX SOLR
OROMUCOSAL | Status: DC | PRN
  Administered 2019-02-20: 5 mL via TOPICAL

## 2019-02-20 MED ORDER — SODIUM CHLORIDE 0.9% FLUSH: 3.0000 mL | INTRAVENOUS | Status: DC | PRN

## 2019-02-20 MED ORDER — THROMBIN 20000 UNITS EX SOLR
CUTANEOUS | Status: DC | PRN
  Administered 2019-02-20: 20 mL

## 2019-02-20 MED ORDER — PROPOFOL 10 MG/ML IV BOLUS
INTRAVENOUS | Status: AC
  Filled 2019-02-20: qty 20

## 2019-02-20 MED ORDER — CEFAZOLIN SODIUM-DEXTROSE 1-4 GM/50ML-% IV SOLN
1.0000 g | Freq: Three times a day (TID) | INTRAVENOUS | Status: AC
  Administered 2019-02-21: 1 g via INTRAVENOUS
  Filled 2019-02-20 (×2): qty 50

## 2019-02-20 MED ORDER — ROCURONIUM BROMIDE 10 MG/ML (PF) SYRINGE
PREFILLED_SYRINGE | INTRAVENOUS | Status: DC | PRN
  Administered 2019-02-20 (×3): 50 mg via INTRAVENOUS

## 2019-02-20 MED ORDER — GLYCOPYRROLATE PF 0.2 MG/ML IJ SOSY
PREFILLED_SYRINGE | INTRAMUSCULAR | Status: AC
  Filled 2019-02-20: qty 2

## 2019-02-20 MED ORDER — THROMBIN 20000 UNITS EX SOLR
CUTANEOUS | Status: AC
  Filled 2019-02-20: qty 20000

## 2019-02-20 MED ORDER — HYDROCODONE-ACETAMINOPHEN 5-325 MG PO TABS
ORAL_TABLET | ORAL | Status: AC
  Filled 2019-02-20: qty 2

## 2019-02-20 MED ORDER — PROPOFOL 10 MG/ML IV BOLUS
INTRAVENOUS | Status: DC | PRN
  Administered 2019-02-20: 160 mg via INTRAVENOUS
  Administered 2019-02-20: 40 mg via INTRAVENOUS

## 2019-02-20 MED ORDER — PHENYLEPHRINE HCL (PRESSORS) 10 MG/ML IV SOLN
INTRAVENOUS | Status: AC
  Filled 2019-02-20: qty 1

## 2019-02-20 MED ORDER — GADOBUTROL 1 MMOL/ML IV SOLN
7.0000 mL | Freq: Once | INTRAVENOUS | Status: AC | PRN
  Administered 2019-02-20: 7 mL via INTRAVENOUS

## 2019-02-20 MED ORDER — MIDAZOLAM HCL 2 MG/2ML IJ SOLN
INTRAMUSCULAR | Status: AC
  Filled 2019-02-20: qty 2

## 2019-02-20 MED ORDER — FENTANYL CITRATE (PF) 250 MCG/5ML IJ SOLN
INTRAMUSCULAR | Status: AC
  Filled 2019-02-20: qty 5

## 2019-02-20 MED ORDER — HYDRALAZINE HCL 20 MG/ML IJ SOLN
10.0000 mg | Freq: Four times a day (QID) | INTRAMUSCULAR | Status: DC | PRN
  Administered 2019-02-20: 10 mg via INTRAVENOUS

## 2019-02-20 MED ORDER — LORAZEPAM 2 MG/ML IJ SOLN
1.0000 mg | Freq: Once | INTRAMUSCULAR | Status: DC
  Filled 2019-02-20: qty 1

## 2019-02-20 MED ORDER — HYDROMORPHONE HCL 1 MG/ML IJ SOLN: 0.2500 mg | INTRAMUSCULAR | Status: DC | PRN

## 2019-02-20 MED ORDER — LIDOCAINE 2% (20 MG/ML) 5 ML SYRINGE
INTRAMUSCULAR | Status: AC
  Filled 2019-02-20: qty 5

## 2019-02-20 MED ORDER — BACITRACIN ZINC 500 UNIT/GM EX OINT
TOPICAL_OINTMENT | CUTANEOUS | Status: AC
  Filled 2019-02-20: qty 28.35

## 2019-02-20 MED ORDER — MENTHOL 3 MG MT LOZG: 1.0000 | LOZENGE | OROMUCOSAL | Status: DC | PRN

## 2019-02-20 MED ORDER — BACITRACIN ZINC 500 UNIT/GM EX OINT
TOPICAL_OINTMENT | CUTANEOUS | Status: DC | PRN
  Administered 2019-02-20: 1 via TOPICAL

## 2019-02-20 MED ORDER — SODIUM CHLORIDE 0.9 % IV SOLN: 250.0000 mL | INTRAVENOUS | Status: DC

## 2019-02-20 MED ORDER — DIAZEPAM 5 MG PO TABS: 5.0000 mg | ORAL_TABLET | Freq: Four times a day (QID) | ORAL | Status: DC | PRN

## 2019-02-20 MED ORDER — MIDAZOLAM HCL 5 MG/5ML IJ SOLN
INTRAMUSCULAR | Status: DC | PRN
  Administered 2019-02-20: 2 mg via INTRAVENOUS

## 2019-02-20 MED ORDER — DEXAMETHASONE SODIUM PHOSPHATE 10 MG/ML IJ SOLN
INTRAMUSCULAR | Status: AC
  Filled 2019-02-20: qty 1

## 2019-02-20 MED ORDER — SUGAMMADEX SODIUM 200 MG/2ML IV SOLN
INTRAVENOUS | Status: DC | PRN
  Administered 2019-02-20: 200 mg via INTRAVENOUS
  Administered 2019-02-20: 100 mg via INTRAVENOUS

## 2019-02-20 MED ORDER — SUCCINYLCHOLINE CHLORIDE 200 MG/10ML IV SOSY
PREFILLED_SYRINGE | INTRAVENOUS | Status: DC | PRN
  Administered 2019-02-20: 100 mg via INTRAVENOUS

## 2019-02-20 MED ORDER — SODIUM CHLORIDE 0.9% FLUSH
3.0000 mL | Freq: Two times a day (BID) | INTRAVENOUS | Status: DC
  Administered 2019-02-20 – 2019-02-23 (×5): 3 mL via INTRAVENOUS

## 2019-02-20 MED ORDER — BUPIVACAINE HCL (PF) 0.25 % IJ SOLN
INTRAMUSCULAR | Status: DC | PRN
  Administered 2019-02-20: 20 mL

## 2019-02-20 MED ORDER — ONDANSETRON HCL 4 MG/2ML IJ SOLN
INTRAMUSCULAR | Status: DC | PRN
  Administered 2019-02-20: 4 mg via INTRAVENOUS

## 2019-02-20 MED ORDER — TAMSULOSIN HCL 0.4 MG PO CAPS
0.4000 mg | ORAL_CAPSULE | Freq: Every day | ORAL | Status: DC
  Administered 2019-02-21 – 2019-02-23 (×3): 0.4 mg via ORAL
  Filled 2019-02-20 (×3): qty 1

## 2019-02-20 MED ORDER — HYDRALAZINE HCL 20 MG/ML IJ SOLN
INTRAMUSCULAR | Status: AC
  Filled 2019-02-20: qty 1

## 2019-02-20 MED ORDER — LORAZEPAM 2 MG/ML IJ SOLN
2.0000 mg | Freq: Once | INTRAMUSCULAR | Status: AC
  Administered 2019-02-20: 2 mg via INTRAVENOUS
  Filled 2019-02-20: qty 1

## 2019-02-20 MED ORDER — OXYCODONE HCL 5 MG PO TABS: 10.0000 mg | ORAL_TABLET | ORAL | Status: DC | PRN

## 2019-02-20 MED ORDER — PHENOL 1.4 % MT LIQD: 1.0000 | OROMUCOSAL | Status: DC | PRN

## 2019-02-20 MED ORDER — BISACODYL 10 MG RE SUPP: 10.0000 mg | Freq: Every day | RECTAL | Status: DC | PRN

## 2019-02-20 MED ORDER — THROMBIN (RECOMBINANT) 20000 UNITS EX SOLR
CUTANEOUS | Status: AC
  Filled 2019-02-20: qty 20000

## 2019-02-20 MED ORDER — ROCURONIUM BROMIDE 10 MG/ML (PF) SYRINGE
PREFILLED_SYRINGE | INTRAVENOUS | Status: AC
  Filled 2019-02-20: qty 10

## 2019-02-20 MED ORDER — BUPIVACAINE HCL (PF) 0.25 % IJ SOLN
INTRAMUSCULAR | Status: AC
  Filled 2019-02-20: qty 30

## 2019-02-20 MED ORDER — THROMBIN 5000 UNITS EX SOLR
CUTANEOUS | Status: AC
  Filled 2019-02-20: qty 5000

## 2019-02-20 MED ORDER — CEFAZOLIN SODIUM-DEXTROSE 2-4 GM/100ML-% IV SOLN
2.0000 g | INTRAVENOUS | Status: AC
  Administered 2019-02-20: 2 g via INTRAVENOUS
  Filled 2019-02-20 (×2): qty 100

## 2019-02-20 MED ORDER — SODIUM CHLORIDE 0.9 % IV SOLN
INTRAVENOUS | Status: DC | PRN
  Administered 2019-02-20: 40 ug/min via INTRAVENOUS

## 2019-02-20 SURGICAL SUPPLY — 67 items
ADH SKN CLS APL DERMABOND .7 (GAUZE/BANDAGES/DRESSINGS) ×1
APL SKNCLS STERI-STRIP NONHPOA (GAUZE/BANDAGES/DRESSINGS) ×1
BAG DECANTER FOR FLEXI CONT (MISCELLANEOUS) ×2 IMPLANT
BENZOIN TINCTURE PRP APPL 2/3 (GAUZE/BANDAGES/DRESSINGS) ×2 IMPLANT
BLADE CLIPPER SURG (BLADE) IMPLANT
BLADE SURG 11 STRL SS (BLADE) IMPLANT
BUR CUTTER 7.0 ROUND (BURR) ×1 IMPLANT
BUR MATCHSTICK NEURO 3.0 LAGG (BURR) ×1 IMPLANT
CANISTER SUCT 3000ML PPV (MISCELLANEOUS) ×2 IMPLANT
CARTRIDGE OIL MAESTRO DRILL (MISCELLANEOUS) ×1 IMPLANT
COVER WAND RF STERILE (DRAPES) ×2 IMPLANT
DERMABOND ADVANCED (GAUZE/BANDAGES/DRESSINGS) ×1
DERMABOND ADVANCED .7 DNX12 (GAUZE/BANDAGES/DRESSINGS) ×1 IMPLANT
DIFFUSER DRILL AIR PNEUMATIC (MISCELLANEOUS) ×2 IMPLANT
DRAPE LAPAROTOMY 100X72 PEDS (DRAPES) IMPLANT
DRAPE LAPAROTOMY 100X72X124 (DRAPES) ×2 IMPLANT
DRAPE MICROSCOPE LEICA (MISCELLANEOUS) ×2 IMPLANT
DRAPE ORTHO SPLIT 77X108 STRL (DRAPES)
DRAPE POUCH INSTRU U-SHP 10X18 (DRAPES) ×2 IMPLANT
DRAPE SURG 17X23 STRL (DRAPES) ×4 IMPLANT
DRAPE SURG ORHT 6 SPLT 77X108 (DRAPES) IMPLANT
DRSG OPSITE POSTOP 4X6 (GAUZE/BANDAGES/DRESSINGS) ×1 IMPLANT
ELECT REM PT RETURN 9FT ADLT (ELECTROSURGICAL) ×2
ELECTRODE REM PT RTRN 9FT ADLT (ELECTROSURGICAL) ×1 IMPLANT
EVACUATOR 1/8 PVC DRAIN (DRAIN) ×1 IMPLANT
GAUZE 4X4 16PLY RFD (DISPOSABLE) IMPLANT
GAUZE SPONGE 4X4 12PLY STRL (GAUZE/BANDAGES/DRESSINGS) ×2 IMPLANT
GLOVE ECLIPSE 9.0 STRL (GLOVE) ×2 IMPLANT
GLOVE EXAM NITRILE XL STR (GLOVE) IMPLANT
GOWN STRL REUS W/ TWL LRG LVL3 (GOWN DISPOSABLE) IMPLANT
GOWN STRL REUS W/ TWL XL LVL3 (GOWN DISPOSABLE) IMPLANT
GOWN STRL REUS W/TWL 2XL LVL3 (GOWN DISPOSABLE) IMPLANT
GOWN STRL REUS W/TWL LRG LVL3 (GOWN DISPOSABLE)
GOWN STRL REUS W/TWL XL LVL3 (GOWN DISPOSABLE)
HEMOSTAT POWDER SURGIFOAM 1G (HEMOSTASIS) ×1 IMPLANT
HEMOSTAT SURGICEL 2X14 (HEMOSTASIS) IMPLANT
KIT BASIN OR (CUSTOM PROCEDURE TRAY) ×2 IMPLANT
KIT TURNOVER KIT B (KITS) ×2 IMPLANT
NDL HYPO 25X1 1.5 SAFETY (NEEDLE) ×1 IMPLANT
NDL SPNL 20GX3.5 QUINCKE YW (NEEDLE) IMPLANT
NEEDLE HYPO 22GX1.5 SAFETY (NEEDLE) ×1 IMPLANT
NEEDLE HYPO 25X1 1.5 SAFETY (NEEDLE) ×2 IMPLANT
NEEDLE SPNL 20GX3.5 QUINCKE YW (NEEDLE) IMPLANT
NS IRRIG 1000ML POUR BTL (IV SOLUTION) ×2 IMPLANT
OIL CARTRIDGE MAESTRO DRILL (MISCELLANEOUS) ×2
PACK LAMINECTOMY NEURO (CUSTOM PROCEDURE TRAY) ×2 IMPLANT
PATTIES SURGICAL .5 X.5 (GAUZE/BANDAGES/DRESSINGS) IMPLANT
PATTIES SURGICAL .5 X3 (DISPOSABLE) ×1 IMPLANT
RUBBERBAND STERILE (MISCELLANEOUS) ×4 IMPLANT
SPONGE LAP 4X18 RFD (DISPOSABLE) IMPLANT
SPONGE SURGIFOAM ABS GEL 100 (HEMOSTASIS) ×1 IMPLANT
STAPLER VISISTAT 35W (STAPLE) ×2 IMPLANT
STRIP CLOSURE SKIN 1/2X4 (GAUZE/BANDAGES/DRESSINGS) ×2 IMPLANT
SUT BONE WAX W31G (SUTURE) IMPLANT
SUT ETHILON 4 0 PS 2 18 (SUTURE) ×2 IMPLANT
SUT NURALON 4 0 TR CR/8 (SUTURE) IMPLANT
SUT PROLENE 6 0 BV (SUTURE) IMPLANT
SUT VIC AB 0 CT1 18XCR BRD8 (SUTURE) IMPLANT
SUT VIC AB 0 CT1 8-18 (SUTURE)
SUT VIC AB 2-0 CT1 18 (SUTURE) ×2 IMPLANT
SUT VIC AB 3-0 SH 8-18 (SUTURE) IMPLANT
SUT VICRYL 4-0 PS2 18IN ABS (SUTURE) IMPLANT
TOWEL GREEN STERILE (TOWEL DISPOSABLE) ×2 IMPLANT
TOWEL GREEN STERILE FF (TOWEL DISPOSABLE) ×2 IMPLANT
TRAY FOLEY MTR SLVR 16FR STAT (SET/KITS/TRAYS/PACK) IMPLANT
TUBE CONNECTING 12X1/4 (SUCTIONS) IMPLANT
WATER STERILE IRR 1000ML POUR (IV SOLUTION) ×2 IMPLANT

## 2019-02-20 NOTE — Progress Notes (Signed)
Patient off the floor for surgery.

## 2019-02-20 NOTE — Progress Notes (Addendum)
Patient off the floor for test. 

## 2019-02-20 NOTE — Progress Notes (Signed)
[facility]                                                                              Patient Demographics  Chris Morales, is a 63 y.o. male, DOB - Jan 09, 1956, FGH:[id number]  Admit date - 02/19/2019   Admitting Physician Chris Larsson, MD  Outpatient Primary MD for the patient is Chris Morales  Outpatient specialists:   LOS - 1  days   Medical records reviewed and are as summarized below:    No chief complaint on file.      Brief summary   Patient is a 63 year old male with no significant past medical history who resides in [place] and works as a Pharmacist, community resented with progressive weakness in his upper extremity left more than right, electrical sensations in his arms.  Patient reported that his symptoms started a week ago after he woke up and felt a "crick" in his cervical spine, felt a difficulty with gait.  No bowel or bladder dysfunction although he felt that he had increased urinary urgency, frequency and felt it could be due to his prostate. COVID-19 test negative  Assessment & Plan    Principal Problem:   Cervical myelopathy (Chris Morales) with progressive weakness in his arms, tingling, left worse than right -CT cervical spine showed abnormal sclerosis in the cervical vertebrae and upper thoracic vertebrae particularly at C5-C6, concern for metastatic disease specially prostate CA, lymphoma or other carcinomas.  Given appearance of the vertebral bodies, posterior epidural tumor at C5 is also differential with hemorrhage or infection. -MRI C-spine pending, started on IV Decadron, discussed with Dr. Annette Morales this morning -Continue IV Decadron   Active Problems:   Prostate CA Chris Morales): New diagnosis -PSA 270, Morales patient history of prostate cancer in his brother.  Patient was having symptoms of urinary frequency and urgency and attributed probably due to possibly having BPH -Given patient is going for MRI of the C-spine, will also obtain thoracic and lumbar  spine to rule out metastatic disease in the spine -Discussed with urology, Chris Morales, will follow-up today, will likely need Firmagon (androgen blocker) versus orchiectomy    AKI (acute kidney injury) ([place]) -Unclear baseline creatinine -Renal ultrasound shows bilateral hydronephrosis, distended bladder, will place Foley catheter -Continue IV fluid hydration    Anemia: Likely due to underlying malignancy, normocytic -Obtain anemia panel, - Morales patient no hematuria, hematochezia or melena.  Hyperglycemia -No known history of diabetes mellitus -Fasting glucose 152, obtain hemoglobin A1c  Hypertension -Elevated BP readings, patient reports no prior history of hypertension -BP improving, will continue IV hydralazine as needed with parameters   Code Status: Full CODE STATUS DVT Prophylaxis:  SCD's Family Communication: Discussed in detail with the patient, all imaging results, lab results explained to the patient    Disposition Plan: Needs extensive inpatient work-up for acute cervical myelopathy, new diagnosis of prostate CA, acute kidney injury with hydronephrosis  Time Spent in minutes   45 minutes  Procedures:  None  Consultants:   Neurosurgery Urology, Chris Morales  Antimicrobials:   Anti-infectives (From admission, onward)   None  Medications  Scheduled Meds:  dexamethasone  10 mg Intravenous Q6H   LORazepam  1 mg Intravenous Once   LORazepam  2 mg Intravenous Once   Continuous Infusions:  sodium chloride 75 mL/hr at 02/19/19 2048   PRN Meds:.acetaminophen **OR** acetaminophen, HYDROcodone-acetaminophen, ondansetron **OR** ondansetron (ZOFRAN) IV      Subjective:   [name] was seen and examined today.  Weakness in the upper extremities still persisting, left worse than right, feels tingling in the arms.  Patient denies dizziness, chest pain, shortness of breath, abdominal pain, N/V/D/C.  No fevers.  BP readings elevated  overnight  Objective:   Vitals:   02/19/19 1932 02/19/19 2336 02/20/19 0347 02/20/19 0851  BP: (!) 174/94 (!) 131/91 (!) 178/96 136/70  Pulse: 79 65 87 78  Resp: 15 14 15 16   Temp: (!) 97.4 F (36.3 C) 98.1 F (36.7 C) 97.9 F (36.6 C) 98.3 F (36.8 C)  TempSrc: Oral Oral Oral Axillary  SpO2: 99% 100% 100% 100%  Weight:      Height:        Intake/Output Summary (Last 24 hours) at 02/20/2019 1028 Last data filed at 02/20/2019 0556 Gross Morales 24 hour  Intake 814.14 ml  Output 750 ml  Net 64.14 ml     Wt Readings from Last 3 Encounters:  02/19/19 73.9 kg     Exam  General: Alert and oriented x 3, NAD, pleasant and cooperative  Eyes:   HEENT:  Atraumatic, normocephalic, normal oropharynx  Cardiovascular: S1 S2 auscultated, Regular rate and rhythm.  Respiratory: Clear to auscultation bilaterally, no wheezing, rales or rhonchi  Gastrointestinal: Soft, nontender, nondistended, + bowel sounds  Ext: no pedal edema bilaterally  Neuro: B/l upper extremity weakness, left grip worse than right, B/L LE 5/5  Musculoskeletal: No digital cyanosis, clubbing  Skin: No rashes  Psych: Normal affect and demeanor, alert and oriented x3    Data Reviewed:  I have personally reviewed following labs and imaging studies  Micro Results Recent Results (from the past 240 hour(s))  SARS Coronavirus 2 (CEPHEID - Performed in [facility]), Hosp Order     Status: None   Collection Time: 02/19/19  4:21 PM   Specimen: Nasopharyngeal Swab  Result Value Ref Range Status   SARS Coronavirus 2 NEGATIVE NEGATIVE Final    Comment: (NOTE) If result is NEGATIVE SARS-CoV-2 target nucleic acids are NOT DETECTED. The SARS-CoV-2 RNA is generally detectable in upper and lower  respiratory specimens during the acute phase of infection. The lowest  concentration of SARS-CoV-2 viral copies this assay can detect is 250  copies / mL. A negative result does not preclude SARS-CoV-2  infection  and should not be used as the sole basis for treatment or other  patient management decisions.  A negative result may occur with  improper specimen collection / handling, submission of specimen other  than nasopharyngeal swab, presence of viral mutation(s) within the  areas targeted by this assay, and inadequate number of viral copies  (<250 copies / mL). A negative result must be combined with clinical  observations, patient history, and epidemiological information. If result is POSITIVE SARS-CoV-2 target nucleic acids are DETECTED. The SARS-CoV-2 RNA is generally detectable in upper and lower  respiratory specimens dur ing the acute phase of infection.  Positive  results are indicative of active infection with SARS-CoV-2.  Clinical  correlation with patient history and other diagnostic information is  necessary to determine patient infection status.  Positive results do  not rule out bacterial infection or co-infection with other viruses. If result is PRESUMPTIVE POSTIVE SARS-CoV-2 nucleic acids MAY BE PRESENT.   A presumptive positive result was obtained on the submitted specimen  and confirmed on repeat testing.  While 2019 novel coronavirus  (SARS-CoV-2) nucleic acids may be present in the submitted sample  additional confirmatory testing may be necessary for epidemiological  and / or clinical management purposes  to differentiate between  SARS-CoV-2 and other Sarbecovirus currently known to infect humans.  If clinically indicated additional testing with an alternate test  methodology [phone]) is advised. The SARS-CoV-2 RNA is generally  detectable in upper and lower respiratory sp ecimens during the acute  phase of infection. The expected result is Negative. Fact Sheet for Patients:  StrictlyIdeas.no Fact Sheet for Healthcare Providers: BankingDealers.co.za This test is not yet approved or cleared by the [place]  FDA and has been authorized for detection and/or diagnosis of SARS-CoV-2 by FDA under an Emergency Use Authorization (EUA).  This EUA will remain in effect (meaning this test can be used) for the duration of the COVID-19 declaration under Section 564(b)(1) of the Act, 21 U.S.C. section 360bbb-3(b)(1), unless the authorization is terminated or revoked sooner. Performed at [facility], [place] [address], [place], [place] [address]     Radiology Reports Ct Abdomen Pelvis Wo Contrast  Result Date: 02/19/2019 CLINICAL DATA:  Sclerotic lesions in cervical spine. Evaluate for multiple myeloma or solid organ malignancy. EXAM: CT ABDOMEN AND PELVIS WITHOUT CONTRAST TECHNIQUE: Multidetector CT imaging of the abdomen and pelvis was performed following the standard protocol without IV contrast. COMPARISON:  None. FINDINGS: Lower chest: 7 mm subpleural pulmonary nodule left lower lobe, image 6 series 4. No acute airspace disease. No pleural fluid. No focal airspace disease. No pleural fluid. Hepatobiliary: Evaluation for focal lesion is limited in the absence of IV contrast. Allowing for this, no focal lesion is visualized. Multiple calcified granuloma. Gallbladder physiologically distended, no calcified stone. No biliary dilatation. Pancreas: Evaluation is limited by absence of IV contrast and paucity of intra-abdominal fat. No evidence of focal pancreatic abnormality. No ductal dilatation or inflammation. Spleen: Spleen is normal to small in size spanning 8.1 cm. Multiple calcified granuloma. Adrenals/Urinary Tract: No evidence of adrenal nodule, adrenal glands poorly visualized. Bilateral hydroureteronephrosis. Ureters are dilated to the bladder insertion. No obvious renal mass on noncontrast exam. The urinary bladder is distended measuring 11.2 x 9.8 x 15.1 cm (volume = 870 cm^3). Mild bladder wall thickening. No obvious bladder mass on noncontrast exam. Stomach/Bowel: Administered enteric contrast reaches  the mid distal small bowel. No obstruction. Stomach physiologically distended without obvious gastric mass. No bowel wall thickening or inflammatory change. Normal appendix. Moderate stool burden throughout the colon. No obvious colonic mass. No colonic wall thickening. Vascular/Lymphatic: Suspected retroperitoneal adenopathy extending from the level of the left renal vein to the iliac bifurcation. Largest node in the left periaortic station measuring 19 mm short axis, image 28 series 3. Lack of IV contrast and paucity of intra-abdominal fat limits assessment. There is left internal and external iliac adenopathy, largest node in the left internal iliac chain measuring 2.3 cm, image 64. Right external iliac/pelvic sidewall adenopathy at 2.2 cm, slightly increased nodal density. Small bilateral inguinal nodes. Reproductive: Prostate gland spans 4.4 x 4.9 cm. Soft tissue prominence of the right seminal vesicle. Other: Minimal fat in the left inguinal canal. No ascites or free air. Musculoskeletal: Sclerotic lesions throughout the included thoracic and lumbar spine. Lesions in  every lumbar vertebral body, largest involving L5. Sclerotic lesion and right posterior tenth rib. Multiple sclerotic lesions in the pelvis. IMPRESSION: 1. Findings suspicious for primary prostate malignancy. Bilateral pelvic and retroperitoneal adenopathy. Multifocal sclerotic osseous lesions. Prominent prostate gland with distended urinary bladder and bilateral hydronephrosis. 2. A 7 mm left lower lobe pulmonary nodule is nonspecific, but metastatic disease is considered. 3. Sequela of prior granulomatous disease with splenic and hepatic calcifications. Electronically Signed   By: Keith Rake M.D.   On: 02/19/2019 22:53   Dg Chest 2 View  Result Date: 02/19/2019 CLINICAL DATA:  Chest pain. EXAM: CHEST - 2 VIEW COMPARISON:  None. FINDINGS: The cardiac silhouette, mediastinal and hilar contours are within normal limits. The lungs are  clear of acute process. No infiltrates, edema or effusions. Rounded density in the left lower chest or could reflect a nipple shadow. A follow-up PA chest film with nipple markers may be helpful to exclude a pulmonary nodule. The bony thorax is intact. IMPRESSION: 1. No acute cardiopulmonary findings. 2. Nodular density in the left lower lobe could reflect a nipple shadow or overlapping bony and vascular markings. A repeat PA chest film with nipple markers may be helpful when able, for further evaluation. Electronically Signed   By: Marijo Sanes M.D.   On: 02/19/2019 14:45   Ct Head Wo Contrast  Result Date: 02/19/2019 CLINICAL DATA:  63 year old and possible stroke. EXAM: CT HEAD WITHOUT CONTRAST TECHNIQUE: Contiguous axial images were obtained from the base of the skull through the vertex without intravenous contrast. COMPARISON:  06/18/2014 FINDINGS: Brain: No evidence of acute infarction, hemorrhage, hydrocephalus, extra-axial collection or mass lesion/mass effect. Vascular: No hyperdense vessel or unexpected calcification. Skull: Normal. Negative for fracture or focal lesion. Sinuses/Orbits: Mucosal disease in the right maxillary sinus which is partially imaged. Other: None IMPRESSION: No acute intracranial abnormality. Right maxillary sinus disease. Electronically Signed   By: Markus Daft M.D.   On: 02/19/2019 08:24   Ct Cervical Spine Wo Contrast  Result Date: 02/19/2019 CLINICAL DATA:  C-spine trauma. Myelopathy.  Abnormal MRI. EXAM: CT CERVICAL SPINE WITHOUT CONTRAST TECHNIQUE: Multidetector CT imaging of the cervical spine was performed without intravenous contrast. Multiplanar CT image reconstructions were also generated. COMPARISON:  MRI cervical spine 02/19/2019 FINDINGS: Alignment: Reversal of the normal cervical lordotic curve. Skull base and vertebrae: Abnormal sclerosis of the cervical vertebrae, C4 through C7, and the visualized thoracic vertebrae, T1 and T2, also involving the facets and  other posterior elements, slight remodeling with loss of height particularly at C5 and C6. Concern for metastatic disease. Soft tissues and spinal canal: The spinal canal is poorly visualized on this exam. However, correlating with MRI, and given the appearance of the vertebral bodies, posterior epidural tumor at C5 is a differential concern. Disc levels: Near complete loss of interspace height from C4 through C7-T1. This results in varying degrees of foraminal narrowing. Upper chest: Negative. Other: None. IMPRESSION: 1. Abnormal sclerosis of the cervical vertebrae, and visualized upper thoracic vertebrae, with slight remodeling and loss of height particularly at C5 and C6. Concern for metastatic disease, specifically prostate cancer in a male. Less common considerations would include transitional cell carcinoma, mucinous adenocarcinomas, or lymphoma. 2. The spinal canal is poorly visualized on this exam, but given the appearance of the vertebral bodies, posterior epidural tumor at C5 is a differential consideration, along with hemorrhage or infection. 3. Consider MRI with contrast for further evaluation, to evaluate the posterior epidural space. As the noncontrast study was considerably  degraded, consider premedication with pain medication or anxiolytics. Electronically Signed   By: Staci Righter M.D.   On: 02/19/2019 15:25   Mr Brain Wo Contrast (neuro Protocol)  Result Date: 02/19/2019 CLINICAL DATA:  Focal neuro deficit greater than 6 hours. Suspect stroke. EXAM: MRI HEAD WITHOUT CONTRAST TECHNIQUE: Multiplanar, multiecho pulse sequences of the brain and surrounding structures were obtained without intravenous contrast. COMPARISON:  CT head 02/19/2019 FINDINGS: Brain: Image quality degraded by motion. Negative for acute or chronic infarction. Ventricle size normal. Negative for mass or edema. Negative for hemorrhage or fluid collection. Vascular: Normal arterial flow voids Skull and upper cervical spine:  Normal skull. Abnormal bone marrow throughout the C4 vertebral body. C2 and C3 vertebral bodies normal. Sinuses/Orbits: Mucosal edema paranasal sinuses.  Normal orbit Other: None IMPRESSION: Image quality degraded by motion Negative MRI of the brain without contrast Diffusely abnormal bone marrow at C4.  Possible tumor involvement. Electronically Signed   By: Franchot Gallo M.D.   On: 02/19/2019 11:06   Mr Cervical Spine Wo Contrast  Addendum Date: 02/19/2019   ADDENDUM REPORT: 02/19/2019 13:07 ADDENDUM: After further review and discussion with Dr. Trenton Gammon, there appears to be a posterior epidural process at the C5 bilaterally. This appears to be contributing to spinal stenosis. This has increased signal on T1 and decreased signal intensity on T2. Possible hemorrhage or abscess. MRI cervical spine without with contrast preferably with sedation would be helpful for further evaluation. Also consider CT cervical spine. Electronically Signed   By: Franchot Gallo M.D.   On: 02/19/2019 13:07   Result Date: 02/19/2019 CLINICAL DATA:  Neck pain. Radiculopathy with arm pain and weakness. EXAM: MRI CERVICAL SPINE WITHOUT CONTRAST TECHNIQUE: Multiplanar, multisequence MR imaging of the cervical spine was performed. No intravenous contrast was administered. COMPARISON:  None. FINDINGS: Alignment: Image quality degraded by significant motion. Normal alignment.  Cervical kyphosis. Vertebrae: Negative for fracture. Diffuse bone marrow abnormality in the cervical spine. The vertebral bodies show low signal on T1 and T2 from C4 through T2 with similar changes in the posterior elements. There is also similar change in the posterior elements but not vertebral body of C3. Cord: Cord evaluation limited by motion however there is possible cord edema throughout the cervical spine from approximately C4 through C6. Posterior Fossa, vertebral arteries, paraspinal tissues: Negative Disc levels: C2-3: Negative C3-4: Mild spinal stenosis.  Disc degeneration and diffuse uncinate spurring. Moderate foraminal narrowing bilaterally C4-5: Moderate spinal stenosis. Disc degeneration and spurring. Moderate foraminal stenosis bilaterally C5-6: Disc degeneration and spondylosis. Moderate left foraminal stenosis and mild-to-moderate spinal stenosis C6-7: Disc degeneration and spondylosis. Mild spinal stenosis and mild foraminal stenosis bilaterally C7-T1: Moderate disc degeneration and spurring. Moderate foraminal stenosis bilaterally. Mild spinal stenosis. IMPRESSION: 1. Image quality degraded by significant motion. 2. 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 3. Cord evaluation limited due to motion however there is suggestion of cord edema throughout much of the cervical spine from C4 through C6. This could be due to myelopathy 4. Diffusely abnormal bone marrow throughout the vertebral bodies and posterior elements from C4-T2. While this pattern could be seen due to advanced disc degeneration, further evaluation for infiltrating neoplastic process such is lymphoma or metastatic disease is warranted. 5. Given the motion degraded study, consider follow-up CT cervical spine. Also, would suggest a follow-up MRI of the cervical spine without with contrast, possibly with sedation, in the near future when the patient is able  to hold still. 6. These results were called by telephone at the time of interpretation on 02/19/2019 at 11:20 am to Dr. Charlesetta Shanks , who verbally acknowledged these results. Electronically Signed: By: Franchot Gallo M.D. On: 02/19/2019 11:22   US Renal  Result Date: 02/20/2019 CLINICAL DATA:  63 year old male with hydronephrosis. EXAM: RENAL / URINARY TRACT ULTRASOUND COMPLETE COMPARISON:  CT Abdomen and Pelvis 02/19/2019. FINDINGS: Right Kidney: Renal measurements: 11.6 x 4.9 x 5.9 centimeters = volume: 175 mL. Moderate hydronephrosis appears  Morales since yesterday. Right hydroureter measures 10 millimeters (image 17). The right kidney appears mildly echogenic on image 2. No right renal mass. Left Kidney: Renal measurements: 10.6 x 6.7 x 5.4 centimeters = volume: 202 mL. Moderate hydronephrosis is Morales since yesterday. Left hydroureter partially visible on image 30. Left renal echogenicity appears similar to that on the right. No left renal mass. Bladder: Dilated with evidence of small bladder diverticula (image 36) and trabeculated appearance of the bladder. Estimated bladder volume 751 milliliters. Unchanged postvoid bladder volume. The right ureteral jet is detected with Doppler. There is a small 9-10 millimeter polypoid but nonvascular lesion located dependently within the bladder on images 42 and 43. Other findings: Irregular contour of the prostate which measures 7.3 x 4.8 x 4.8 centimeters (image 44). IMPRESSION: 1. Dilated urinary bladder (751 mL) with no postvoid emptying and Morales moderate bilateral hydronephrosis and hydroureter since yesterday. 2. Small 10 mm bladder polyp or dependent tumefactive debris (image 42). Small bladder diverticula also noted. Electronically Signed   By: Genevie Ann M.D.   On: 02/20/2019 09:51    Lab Data:  CBC: Recent Labs  Lab 02/19/19 0712 02/19/19 0721  WBC 5.9  --   NEUTROABS 3.4  --   HGB 11.7* 12.9*  HCT 37.0* 38.0*  MCV 92.5  --   PLT 222  --    Basic Metabolic Panel: Recent Labs  Lab 02/19/19 0712 02/19/19 0721 02/20/19 0657  NA 139 140 138  K 3.8 3.8 4.3  CL 106 106 106  CO2 24  --  22  GLUCOSE 174* 172* 152*  BUN 19 21 20   CREATININE 1.84* 1.70* 1.57*  CALCIUM 8.7*  --  8.6*   GFR: Estimated Creatinine Clearance: 51 mL/min (A) (by C-G formula based on SCr of 1.57 mg/dL (H)). Liver Function Tests: Recent Labs  Lab 02/19/19 0712  AST 30  ALT 23  ALKPHOS 455*  BILITOT 0.9  PROT 6.5  ALBUMIN 3.4*   No results for input(s): LIPASE, AMYLASE in the last 168 hours. No  results for input(s): AMMONIA in the last 168 hours. Coagulation Profile: Recent Labs  Lab 02/19/19 0712  INR 1.2   Cardiac Enzymes: No results for input(s): CKTOTAL, CKMB, CKMBINDEX, TROPONINI in the last 168 hours. BNP (last 3 results) No results for input(s): PROBNP in the last 8760 hours. HbA1C: No results for input(s): HGBA1C in the last 72 hours. CBG: Recent Labs  Lab 02/19/19 1206  GLUCAP 76   Lipid Profile: No results for input(s): CHOL, HDL, LDLCALC, TRIG, CHOLHDL, LDLDIRECT in the last 72 hours. Thyroid Function Tests: No results for input(s): TSH, T4TOTAL, FREET4, T3FREE, THYROIDAB in the last 72 hours. Anemia Panel: No results for input(s): VITAMINB12, FOLATE, FERRITIN, TIBC, IRON, RETICCTPCT in the last 72 hours. Urine analysis: No results found for: COLORURINE, APPEARANCEUR, LABSPEC, PHURINE, GLUCOSEU, HGBUR, BILIRUBINUR, KETONESUR, PROTEINUR, UROBILINOGEN, NITRITE, LEUKOCYTESUR   Luna Audia M.D. [facility] 02/20/2019, 10:28 AM  Pager: [phone] Between 7am to 7pm -  call Pager - [phone]  After 7pm go to [email or web address] - password [email or web address]  Call night coverage person covering after 7pm

## 2019-02-20 NOTE — Brief Op Note (Signed)
02/20/2019  4:04 PM  PATIENT:  Chris Morales  63 y.o. male  PRE-OPERATIVE DIAGNOSIS:  Cervical epidural tumor with myelopathy  POST-OPERATIVE DIAGNOSIS:  Cervical epidural tumor with myelopathy  PROCEDURE:  Procedure(s) with comments: CERVICAL LAMINECTOMY FOR TUMOR (Bilateral) - CERVICAL LAMINECTOMY FOR TUMOR  SURGEON:  Surgeon(s) and Role:    Earnie Larsson, MD - Primary  PHYSICIAN ASSISTANT:   ASSISTANTSMearl Latin   ANESTHESIA:   general  EBL:  75 mL   BLOOD ADMINISTERED:none  DRAINS: (med) Hemovact drain(s) in the epidural space with  Suction Open   LOCAL MEDICATIONS USED:  MARCAINE     SPECIMEN:  Source of Specimen:  C5 epidural space and Excision  DISPOSITION OF SPECIMEN:  PATHOLOGY  COUNTS:  YES  TOURNIQUET:  * No tourniquets in log *  DICTATION: .Dragon Dictation  PLAN OF CARE: Admit to inpatient   PATIENT DISPOSITION:  PACU - hemodynamically stable.   Delay start of Pharmacological VTE agent (>24hrs) due to surgical blood loss or risk of bleeding: yes

## 2019-02-20 NOTE — Progress Notes (Signed)
Patient with widely metastatic prostate carcinoma.  Continues to have bilateral upper extremity sensory loss and weakness with bilateral lower extremity spastic weakness.  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  I discussed situation with the patient.  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  I discussed the possibility moving forward with emergent C4, C5, C6 laminectomy and resection of epidural tumor.  I discussed the risks involved with surgery including but not limited to the risk of anesthesia, bleeding, infection, CSF leak, nerve root injury, spinal cord injury, later instability, continued pain and non-benefit.  I have also discussed the need for further treatment whether it be hormonal and/or radiation treatment.  The patient has been given the opportunity ask questions.  He appears to understand.  He wishes to proceed with surgery.

## 2019-02-20 NOTE — Consult Note (Signed)
Subjective: CC: Left arm weakness.  Hx: Chris Morales is a 63 yo AAM who I was asked to see in consultation by Dr. Charlesetta Shanks for metastatic prostate cancer.  Chris Morales was admitted with left arm weakness and was found to have widely metastatic cancer with a PSA of 270 and a family history of prostate cancer in his brother.   He has been seen by Dr. 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   The CT showed extensive bone and lymph node disease and bladder distention with bilateral hydro.   The patient reports some progressive LUTS with a reduced stream over the last few months but has otherwise been well until he felt a crick in his neck prior to this admission with progressive weakness in his left arm.   He ran a marathon in March and has no significant prior medical history.   ROS:  Review of Systems  Constitutional: Positive for weight loss (he has lost to his goal weight over the last few months).  Genitourinary: Positive for frequency.  Neurological: Positive for focal weakness.  All other systems reviewed and are negative.   No Known Allergies  History reviewed. No pertinent past medical history.  History reviewed. No pertinent surgical history.  Social History   Socioeconomic History  . Marital status: Divorced    Spouse name: Not on file  . Number of children: Not on file  . Years of education: Not on file  . Highest education level: Not on file  Occupational History  . Not on file  Social Needs  . Financial resource strain: Not on file  . Food insecurity    Worry: Not on file    Inability: Not on file  . Transportation needs    Medical: Not on file    Non-medical: Not on file  Tobacco Use  . Smoking status: Never Smoker  . Smokeless tobacco: Never Used  Substance and Sexual Activity  . Alcohol use: Yes  . Drug use: No  . Sexual activity: Not Currently  Lifestyle  . Physical activity    Days per week: Not on file    Minutes per  session: Not on file  . Stress: Not on file  Relationships  . Social Herbalist on phone: Not on file    Gets together: Not on file    Attends religious service: Not on file    Active member of club or organization: Not on file    Attends meetings of clubs or organizations: Not on file    Relationship status: Not on file  . Intimate partner violence    Fear of current or ex partner: Not on file    Emotionally abused: Not on file    Physically abused: Not on file    Forced sexual activity: Not on file  Other Topics Concern  . Not on file  Social History Narrative  . Not on file    History reviewed. No pertinent family history.  Anti-infectives: Anti-infectives (From admission, onward)   Start     Dose/Rate Route Frequency Ordered Stop   02/20/19 1515  bacitracin 50,000 Units in sodium chloride 0.9 % 500 mL irrigation  Status:  Discontinued       As needed 02/20/19 1515 02/20/19 1623   02/20/19 1300  ceFAZolin (ANCEF) IVPB 2g/100 mL premix     2 g 200 mL/hr over 30 Minutes Intravenous 30 min pre-op 02/20/19 1247 02/20/19 1452  Current Facility-Administered Medications  Medication Dose Route Frequency Provider Last Rate Last Dose  . 0.9 %  sodium chloride infusion   Intravenous Continuous Earnie Larsson, MD 75 mL/hr at 02/20/19 1638    . [MAR Hold] acetaminophen (TYLENOL) tablet 650 mg  650 mg Oral Q6H PRN Earnie Larsson, MD       Or  . Doug Sou Hold] acetaminophen (TYLENOL) suppository 650 mg  650 mg Rectal Q6H PRN Earnie Larsson, MD      . Doug Sou Hold] dexamethasone (DECADRON) injection 10 mg  10 mg Intravenous Q6H Earnie Larsson, MD   10 mg at 02/20/19 0544  . hydrALAZINE (APRESOLINE) 20 MG/ML injection           . [MAR Hold] hydrALAZINE (APRESOLINE) injection 10 mg  10 mg Intravenous Q6H PRN Earnie Larsson, MD   10 mg at 02/20/19 1637  . [MAR Hold] HYDROcodone-acetaminophen (NORCO/VICODIN) 5-325 MG per tablet 1-2 tablet  1-2 tablet Oral Q4H PRN Earnie Larsson, MD   2 tablet at  02/20/19 1654  . HYDROcodone-acetaminophen (NORCO/VICODIN) 5-325 MG per tablet           . HYDROmorphone (DILAUDID) injection 0.25-0.5 mg  0.25-0.5 mg Intravenous Q5 min PRN Myrtie Soman, MD      . lactated ringers infusion   Intravenous Continuous Annye Asa, MD      . Doug Sou Hold] LORazepam (ATIVAN) injection 1 mg  1 mg Intravenous Once Lovey Newcomer T, NP      . Doug Sou Hold] ondansetron [facility]) tablet 4 mg  4 mg Oral Q6H PRN Earnie Larsson, MD       Or  . Doug Sou Hold] ondansetron [facility]) injection 4 mg  4 mg Intravenous Q6H PRN Earnie Larsson, MD      . promethazine (PHENERGAN) injection 6.25-12.5 mg  6.25-12.5 mg Intravenous Q15 min PRN Myrtie Soman, MD      . Doug Sou Hold] tamsulosin [facility]) capsule 0.4 mg  0.4 mg Oral Daily Rai, Ripudeep K, MD         Objective: Vital signs in last 24 hours: Temp:  [97.4 F (36.3 C)-98.3 F (36.8 C)] 97.6 F (36.4 C) (06/22 1618) Pulse Rate:  [65-92] 92 (06/22 1647) Resp:  [14-21] 20 (06/22 1647) BP: (131-178)/(75-106) 135/75 (06/22 1647) SpO2:  [99 %-100 %] 100 % (06/22 1647) Arterial Line BP: (177-195)/(76-95) 177/76 (06/22 1647)  Intake/Output from previous day: 06/21 0701 - 06/22 0700 In: 814.1 [P.O.:240; I.V.:574.1] Out: 750 [Urine:750] Intake/Output this shift: Total I/O In: 1000 [I.V.:1000] Out: 1275 [Urine:1200; Blood:75]   Physical Exam Vitals signs reviewed.  Constitutional:      Appearance: Normal appearance. He is normal weight.  HENT:     Head: Normocephalic and atraumatic.  Neck:     Musculoskeletal: Normal range of motion and neck supple.  Cardiovascular:     Rate and Rhythm: Normal rate and regular rhythm.     Heart sounds: Normal heart sounds.  Pulmonary:     Effort: Pulmonary effort is normal. No respiratory distress.     Breath sounds: Normal breath sounds.  Abdominal:     General: Abdomen is flat. Bowel sounds are normal.     Palpations: Abdomen is soft. There is mass (suprapubic consistent with a distended  bladder. ).  Genitourinary:    Penis: Normal.      Scrotum/Testes: Normal.     Comments: A/P normal with NST.  No rectal mass. Prostate is 2+ firm with multiple nodules and possible induration of the right SV. Musculoskeletal:  General: No swelling or tenderness.     Comments: FROM except in the left arm which is very weak.   Neurological:     Mental Status: He is alert and oriented to person, place, and time.     Motor: Weakness (LUE) present.  Psychiatric:        Mood and Affect: Mood normal.        Behavior: Behavior normal.     Lab Results:  Recent Labs    02/19/19 0712 02/19/19 0721  WBC 5.9  --   HGB 11.7* 12.9*  HCT 37.0* 38.0*  PLT 222  --    BMET Recent Labs    02/19/19 0712 02/19/19 0721 02/20/19 0657  NA 139 140 138  K 3.8 3.8 4.3  CL 106 106 106  CO2 24  --  22  GLUCOSE 174* 172* 152*  BUN 19 21 20   CREATININE 1.84* 1.70* 1.57*  CALCIUM 8.7*  --  8.6*   PT/INR Recent Labs    02/19/19 0712  LABPROT 14.9  INR 1.2   ABG No results for input(s): PHART, HCO3 in the last 72 hours.  Invalid input(s): PCO2, PO2  Studies/Results: Ct Abdomen Pelvis Wo Contrast  Result Date: 02/19/2019 CLINICAL DATA:  Sclerotic lesions in cervical spine. Evaluate for multiple myeloma or solid organ malignancy. EXAM: CT ABDOMEN AND PELVIS WITHOUT CONTRAST TECHNIQUE: Multidetector CT imaging of the abdomen and pelvis was performed following the standard protocol without IV contrast. COMPARISON:  None. FINDINGS: Lower chest: 7 mm subpleural pulmonary nodule left lower lobe, image 6 series 4. No acute airspace disease. No pleural fluid. No focal airspace disease. No pleural fluid. Hepatobiliary: Evaluation for focal lesion is limited in the absence of IV contrast. Allowing for this, no focal lesion is visualized. Multiple calcified granuloma. Gallbladder physiologically distended, no calcified stone. No biliary dilatation. Pancreas: Evaluation is limited by absence of IV  contrast and paucity of intra-abdominal fat. No evidence of focal pancreatic abnormality. No ductal dilatation or inflammation. Spleen: Spleen is normal to small in size spanning 8.1 cm. Multiple calcified granuloma. Adrenals/Urinary Tract: No evidence of adrenal nodule, adrenal glands poorly visualized. Bilateral hydroureteronephrosis. Ureters are dilated to the bladder insertion. No obvious renal mass on noncontrast exam. The urinary bladder is distended measuring 11.2 x 9.8 x 15.1 cm (volume = 870 cm^3). Mild bladder wall thickening. No obvious bladder mass on noncontrast exam. Stomach/Bowel: Administered enteric contrast reaches the mid distal small bowel. No obstruction. Stomach physiologically distended without obvious gastric mass. No bowel wall thickening or inflammatory change. Normal appendix. Moderate stool burden throughout the colon. No obvious colonic mass. No colonic wall thickening. Vascular/Lymphatic: Suspected retroperitoneal adenopathy extending from the level of the left renal vein to the iliac bifurcation. Largest node in the left periaortic station measuring 19 mm short axis, image 28 series 3. Lack of IV contrast and paucity of intra-abdominal fat limits assessment. There is left internal and external iliac adenopathy, largest node in the left internal iliac chain measuring 2.3 cm, image 64. Right external iliac/pelvic sidewall adenopathy at 2.2 cm, slightly increased nodal density. Small bilateral inguinal nodes. Reproductive: Prostate gland spans 4.4 x 4.9 cm. Soft tissue prominence of the right seminal vesicle. Other: Minimal fat in the left inguinal canal. No ascites or free air. Musculoskeletal: Sclerotic lesions throughout the included thoracic and lumbar spine. Lesions in every lumbar vertebral body, largest involving L5. Sclerotic lesion and right posterior tenth rib. Multiple sclerotic lesions in the pelvis. IMPRESSION: 1. Findings suspicious for  primary prostate malignancy. Bilateral  pelvic and retroperitoneal adenopathy. Multifocal sclerotic osseous lesions. Prominent prostate gland with distended urinary bladder and bilateral hydronephrosis. 2. A 7 mm left lower lobe pulmonary nodule is nonspecific, but metastatic disease is considered. 3. Sequela of prior granulomatous disease with splenic and hepatic calcifications. Electronically Signed   By: Keith Rake M.D.   On: 02/19/2019 22:53   Dg Chest 2 View  Result Date: 02/19/2019 CLINICAL DATA:  Chest pain. EXAM: CHEST - 2 VIEW COMPARISON:  None. FINDINGS: The cardiac silhouette, mediastinal and hilar contours are within normal limits. The lungs are clear of acute process. No infiltrates, edema or effusions. Rounded density in the left lower chest or could reflect a nipple shadow. A follow-up PA chest film with nipple markers may be helpful to exclude a pulmonary nodule. The bony thorax is intact. IMPRESSION: 1. No acute cardiopulmonary findings. 2. Nodular density in the left lower lobe could reflect a nipple shadow or overlapping bony and vascular markings. A repeat PA chest film with nipple markers may be helpful when able, for further evaluation. Electronically Signed   By: Marijo Sanes M.D.   On: 02/19/2019 14:45   Dg Cervical Spine 1 View  Result Date: 02/20/2019 CLINICAL DATA:  Epidural tumor. EXAM: DG CERVICAL SPINE - 1 VIEW COMPARISON:  Cervical MRI dated 02/20/2019 FINDINGS: Lateral view of the cervical spine demonstrates an instrument at the C4-5 level posteriorly. This is the level of the previously demonstrated epidural tumor. Note is made of abnormal sclerosis and patchy lucency in the vertebra at C4, C5, C6, and C7 consistent with the tumor demonstrated on the prior exam. IMPRESSION: Localization instrument at C4-5. Electronically Signed   By: Lorriane Shire M.D.   On: 02/20/2019 16:51   Ct Head Wo Contrast  Result Date: 02/19/2019 CLINICAL DATA:  63 year old and possible stroke. EXAM: CT HEAD WITHOUT CONTRAST  TECHNIQUE: Contiguous axial images were obtained from the base of the skull through the vertex without intravenous contrast. COMPARISON:  06/18/2014 FINDINGS: Brain: No evidence of acute infarction, hemorrhage, hydrocephalus, extra-axial collection or mass lesion/mass effect. Vascular: No hyperdense vessel or unexpected calcification. Skull: Normal. Negative for fracture or focal lesion. Sinuses/Orbits: Mucosal disease in the right maxillary sinus which is partially imaged. Other: None IMPRESSION: No acute intracranial abnormality. Right maxillary sinus disease. Electronically Signed   By: Markus Daft M.D.   On: 02/19/2019 08:24   Ct Cervical Spine Wo Contrast  Result Date: 02/19/2019 CLINICAL DATA:  C-spine trauma. Myelopathy.  Abnormal MRI. EXAM: CT CERVICAL SPINE WITHOUT CONTRAST TECHNIQUE: Multidetector CT imaging of the cervical spine was performed without intravenous contrast. Multiplanar CT image reconstructions were also generated. COMPARISON:  MRI cervical spine 02/19/2019 FINDINGS: Alignment: Reversal of the normal cervical lordotic curve. Skull base and vertebrae: Abnormal sclerosis of the cervical vertebrae, C4 through C7, and the visualized thoracic vertebrae, T1 and T2, also involving the facets and other posterior elements, slight remodeling with loss of height particularly at C5 and C6. Concern for metastatic disease. Soft tissues and spinal canal: The spinal canal is poorly visualized on this exam. However, correlating with MRI, and given the appearance of the vertebral bodies, posterior epidural tumor at C5 is a differential concern. Disc levels: Near complete loss of interspace height from C4 through C7-T1. This results in varying degrees of foraminal narrowing. Upper chest: Negative. Other: None. IMPRESSION: 1. Abnormal sclerosis of the cervical vertebrae, and visualized upper thoracic vertebrae, with slight remodeling and loss of height particularly at C5 and  C6. Concern for metastatic  disease, specifically prostate cancer in a male. Less common considerations would include transitional cell carcinoma, mucinous adenocarcinomas, or lymphoma. 2. The spinal canal is poorly visualized on this exam, but given the appearance of the vertebral bodies, posterior epidural tumor at C5 is a differential consideration, along with hemorrhage or infection. 3. Consider MRI with contrast for further evaluation, to evaluate the posterior epidural space. As the noncontrast study was considerably degraded, consider premedication with pain medication or anxiolytics. Electronically Signed   By: Staci Righter M.D.   On: 02/19/2019 15:25   Mr Brain Wo Contrast (neuro Protocol)  Result Date: 02/19/2019 CLINICAL DATA:  Focal neuro deficit greater than 6 hours. Suspect stroke. EXAM: MRI HEAD WITHOUT CONTRAST TECHNIQUE: Multiplanar, multiecho pulse sequences of the brain and surrounding structures were obtained without intravenous contrast. COMPARISON:  CT head 02/19/2019 FINDINGS: Brain: Image quality degraded by motion. Negative for acute or chronic infarction. Ventricle size normal. Negative for mass or edema. Negative for hemorrhage or fluid collection. Vascular: Normal arterial flow voids Skull and upper cervical spine: Normal skull. Abnormal bone marrow throughout the C4 vertebral body. C2 and C3 vertebral bodies normal. Sinuses/Orbits: Mucosal edema paranasal sinuses.  Normal orbit Other: None IMPRESSION: Image quality degraded by motion Negative MRI of the brain without contrast Diffusely abnormal bone marrow at C4.  Possible tumor involvement. Electronically Signed   By: Franchot Gallo M.D.   On: 02/19/2019 11:06   Mr Cervical Spine Wo Contrast  Addendum Date: 02/19/2019   ADDENDUM REPORT: 02/19/2019 13:07 ADDENDUM: After further review and discussion with Dr. Trenton Gammon, there appears to be a posterior epidural process at the C5 bilaterally. This appears to be contributing to spinal stenosis. This has increased  signal on T1 and decreased signal intensity on T2. Possible hemorrhage or abscess. MRI cervical spine without with contrast preferably with sedation would be helpful for further evaluation. Also consider CT cervical spine. Electronically Signed   By: Franchot Gallo M.D.   On: 02/19/2019 13:07   Result Date: 02/19/2019 CLINICAL DATA:  Neck pain. Radiculopathy with arm pain and weakness. EXAM: MRI CERVICAL SPINE WITHOUT CONTRAST TECHNIQUE: Multiplanar, multisequence MR imaging of the cervical spine was performed. No intravenous contrast was administered. COMPARISON:  None. FINDINGS: Alignment: Image quality degraded by significant motion. Normal alignment.  Cervical kyphosis. Vertebrae: Negative for fracture. Diffuse bone marrow abnormality in the cervical spine. The vertebral bodies show low signal on T1 and T2 from C4 through T2 with similar changes in the posterior elements. There is also similar change in the posterior elements but not vertebral body of C3. Cord: Cord evaluation limited by motion however there is possible cord edema throughout the cervical spine from approximately C4 through C6. Posterior Fossa, vertebral arteries, paraspinal tissues: Negative Disc levels: C2-3: Negative C3-4: Mild spinal stenosis. Disc degeneration and diffuse uncinate spurring. Moderate foraminal narrowing bilaterally C4-5: Moderate spinal stenosis. Disc degeneration and spurring. Moderate foraminal stenosis bilaterally C5-6: Disc degeneration and spondylosis. Moderate left foraminal stenosis and mild-to-moderate spinal stenosis C6-7: Disc degeneration and spondylosis. Mild spinal stenosis and mild foraminal stenosis bilaterally C7-T1: Moderate disc degeneration and spurring. Moderate foraminal stenosis bilaterally. Mild spinal stenosis. IMPRESSION: 1. Image quality degraded by significant motion. 2. Extensive disc degeneration and spondylosis throughout the cervical spine causing spinal and foraminal stenosis at multiple  levels. 3. Cord evaluation limited due to motion however there is suggestion of cord edema throughout much of the cervical spine from C4 through C6. This could be due to  myelopathy 4. Diffusely abnormal bone marrow throughout the vertebral bodies and posterior elements from C4-T2. While this pattern could be seen due to advanced disc degeneration, further evaluation for infiltrating neoplastic process such is lymphoma or metastatic disease is warranted. 5. Given the motion degraded study, consider follow-up CT cervical spine. Also, would suggest a follow-up MRI of the cervical spine without with contrast, possibly with sedation, in the near future when the patient is able to hold still. 6. These results were called by telephone at the time of interpretation on 02/19/2019 at 11:20 am to Dr. Charlesetta Shanks , who verbally acknowledged these results. Electronically Signed: By: Franchot Gallo M.D. On: 02/19/2019 11:22   Mr Cervical Spine W Contrast  Result Date: 02/20/2019 CLINICAL DATA:  Myelopathy symptoms. EXAM: MRI CERVICAL SPINE WITH CONTRAST TECHNIQUE: Multiplanar, multisequence MR imaging of the cervical spine was performed following the administration of intravenous contrast. COMPARISON:  Noncontrast MRI the cervical spine 02/19/2019. MRI thoracic and lumbar spine described separately. FINDINGS: Alignment: Straightening of the normal cervical lordosis. Vertebrae: Diffusely abnormal C4 through C7 cervical vertebrae, including posterior elements consistent with metastatic disease. Cord: Significant cord compression maximal at C5, primarily due to bulky epidural tumor. There is a focal deposit in the midline, eccentric to the LEFT, cross-section 8 x 9 x 12 mm. The mass demonstrates T1 shortening precontrast, along with moderate enhancement after administration of gadolinium. Lateral extension of tumor inside the lamina toward the LEFT foramen. Significant narrowing the spinal canal, as narrow as 4 mm. Abnormal  dural enhancement, primarily dorsal extends above and below the focal deposit. Posterior Fossa, vertebral arteries, paraspinal tissues: Unremarkable posterior fossa. Disc levels: Unchanged from yesterday's CT. IMPRESSION: Widespread osseous metastatic disease C4 through C7. Additional metastatic disease described separately. Enhancing epidural tumor maximal at the C5 vertebral segment dorsally, eccentric to the LEFT. Significant spinal stenosis and cord compression. 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 Precontrast T1 shortening raises the question of coexistent recent hemorrhage. Electronically Signed   By: Staci Righter M.D.   On: 02/20/2019 12:47   Mr Thoracic Spine W Wo Contrast  Result Date: 02/20/2019 CLINICAL DATA:  Continued surveillance of suspected metastatic disease. Myelopathy. Neck pain. EXAM: MRI THORACIC WITHOUT AND WITH CONTRAST TECHNIQUE: Multiplanar and multiecho pulse sequences of the thoracic spine were obtained without and with intravenous contrast. CONTRAST:  Gadavist 7 mL. COMPARISON:  MRI cervical spine and lumbar spine reported separately. FINDINGS: MRI THORACIC SPINE FINDINGS Alignment:  Physiologic. Vertebrae: Widespread metastatic disease throughout all visualized thoracic vertebrae, with the greatest involvement in T1, T2, T3, T4, T7, and T11. No pathologic compression fracture. Cord:  Normal signal and morphology.  No epidural tumor. Paraspinal and other soft tissues: No significant paravertebral tumor. Disc levels: No compressive disc protrusion or spinal stenosis. IMPRESSION: Widespread metastatic disease throughout all visualized thoracic vertebrae. No pathologic compression fracture. No epidural tumor. Electronically Signed   By: Staci Righter M.D.   On: 02/20/2019 12:54   Mr Lumbar Spine W Wo Contrast  Result Date: 02/20/2019 CLINICAL DATA:  Continued surveillance suspected prostate cancer with metastatic disease. EXAM: MRI LUMBAR  SPINE WITHOUT AND WITH CONTRAST TECHNIQUE: Multiplanar and multiecho pulse sequences of the lumbar spine were obtained without and with intravenous contrast. CONTRAST:  Gadavist 7 mL. COMPARISON:  MRI cervical and thoracic spine reported separately. Correlate with CT abdomen and pelvis from 02/19/2019. FINDINGS: Segmentation:  Standard. Alignment:  Anatomic Vertebrae: Widespread metastatic disease throughout all visualized lumbar vertebrae. No pathologic fracture.  Abnormal foci of signal abnormality representing metastatic disease involve the sacrum and both iliac bones. Conus medullaris and cauda equina: Conus extends to the L1 level. Conus and cauda equina appear normal. No epidural tumor. Paraspinal and other soft tissues: Marked bladder distension due to outlet obstruction, prostatomegaly. BILATERAL hydronephrosis. Retroperitoneal adenopathy. Disc levels: No compressive disc protrusion or spinal stenosis. IMPRESSION: Widespread metastatic disease throughout all visualized lumbar vertebrae. No pathologic fracture. No epidural tumor. Prostatomegaly, BILATERAL hydronephrosis, and retroperitoneal adenopathy, sequelae of prostate cancer with regional metastases. Electronically Signed   By: Staci Righter M.D.   On: 02/20/2019 13:02   US Renal  Result Date: 02/20/2019 CLINICAL DATA:  63 year old male with hydronephrosis. EXAM: RENAL / URINARY TRACT ULTRASOUND COMPLETE COMPARISON:  CT Abdomen and Pelvis 02/19/2019. FINDINGS: Right Kidney: Renal measurements: 11.6 x 4.9 x 5.9 centimeters = volume: 175 mL. Moderate hydronephrosis appears stable since yesterday. Right hydroureter measures 10 millimeters (image 17). The right kidney appears mildly echogenic on image 2. No right renal mass. Left Kidney: Renal measurements: 10.6 x 6.7 x 5.4 centimeters = volume: 202 mL. Moderate hydronephrosis is stable since yesterday. Left hydroureter partially visible on image 30. Left renal echogenicity appears similar to that on the  right. No left renal mass. Bladder: Dilated with evidence of small bladder diverticula (image 36) and trabeculated appearance of the bladder. Estimated bladder volume 751 milliliters. Unchanged postvoid bladder volume. The right ureteral jet is detected with Doppler. There is a small 9-10 millimeter polypoid but nonvascular lesion located dependently within the bladder on images 42 and 43. Other findings: Irregular contour of the prostate which measures 7.3 x 4.8 x 4.8 centimeters (image 44). IMPRESSION: 1. Dilated urinary bladder (751 mL) with no postvoid emptying and stable moderate bilateral hydronephrosis and hydroureter since yesterday. 2. Small 10 mm bladder polyp or dependent tumefactive debris (image 42). Small bladder diverticula also noted. Electronically Signed   By: Genevie Ann M.D.   On: 02/20/2019 09:51   I discussed his case with Dr. Johnney Killian and Dr. Annette Stable.  I have reviewed his CT imaging and reports and his MRI reports.  I have reviewed his labs.   Assessment: 1. 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   He is going to need ADT ASAP and I discussed orchiectomy and Firmagon.  He had questions about sperm preservation but we don't have local access for that and his disease state doesn't warrant delaying therapy.  Also his life expectancy will be severely impacted by this diagnosis.  I will discuss this with him again and at a minimum will recommend initiating firmagon in Hospital.  2. Cervical cord compression:  He is to have a decompression today.  3.  Urinary retention with hydro and AKI is more consistent with BOO and opposed to a neurogenic cause at this point.   I have recommended a foley catheter as initial therapy.    CC: Dr. Charlesetta Shanks, Dr. Merton Border and Dr. Earnie Larsson.     Irine Seal 02/20/2019 [phone]

## 2019-02-20 NOTE — Anesthesia Postprocedure Evaluation (Signed)
Anesthesia Post Note  Patient: Chris Morales  Procedure(s) Performed: CERVICAL LAMINECTOMY FOR TUMOR (Bilateral Spine Cervical)     Patient location during evaluation: PACU Anesthesia Type: General Level of consciousness: awake and alert Pain management: pain level controlled Vital Signs Assessment: post-procedure vital signs reviewed and stable Respiratory status: spontaneous breathing, nonlabored ventilation, respiratory function stable and patient connected to nasal cannula oxygen Cardiovascular status: blood pressure returned to baseline and stable Postop Assessment: no apparent nausea or vomiting Anesthetic complications: no    Last Vitals:  Vitals:   02/20/19 1631 02/20/19 1647  BP: (!) 166/106 135/75  Pulse: 84 92  Resp: 15 20  Temp:    SpO2: 100% 100%    Last Pain:  Vitals:   02/20/19 1645  TempSrc:   PainSc: 4                  [name]

## 2019-02-20 NOTE — Op Note (Signed)
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  Asst. Surgeon: Reinaldo Meeker, NP  Anesthesia: General  Indication: 63 year old male with progressive cervical myelopathy and recently discovered widespread metastatic prostate cancer.  Work-up demonstrates evidence of severe cervical stenosis with complicated by a posterior epidural mass consistent with metastatic tumor at the C5 level.  Patient presents now for cervical laminectomy and resection of epidural tumor.  There is no obvious instability at this level and I do not feel that fusion is necessary for treatment of his problem at this point.  Operative note: After induction anesthesia, patient position prone onto bolsters with his head fixed in a neutral head position using a [place] pin headrest.  Patient's posterior cervical region was prepped and draped sterilely.  Incision made overlying C4-5 and 6.  Dissection performed bilaterally.  Retractor placed.  X-ray taken.  Level confirmed.  Decompressive laminectomy then performed at C4, C5, C6 by removing the spinous process and lamina of all 3 levels utilizing [place] rongeurs Kerrison Rogers and Surveyor, mining.  Ligamentum flavum was resected although in many places it was tightly adherent to the dura and the tumor.  Utilizing micro dissection within the spinal canal the tumor was carefully dissected free from the underlying dura.  Gross total visual resection was achieved.  There is no evidence of injury to thecal sac or nerve roots.  Hemostasis was achieved with the bipolar cautery.  Tumor was sent to pathology for final analysis.  A medium Hemovac drain was left in the epidural space.  Wounds and closed in layers with  Vicryl sutures.  Steri-Strips and sterile dressing were applied.  No apparent complications.  Patient tolerated the procedure well and he returns to the recovery room postop.

## 2019-02-20 NOTE — Anesthesia Procedure Notes (Addendum)
Procedure Name: Intubation Date/Time: 02/20/2019 2:27 PM Performed by: Marsa Aris, CRNA Pre-anesthesia Checklist: Patient identified, Emergency Drugs available, Suction available and Patient being monitored Patient Re-evaluated:Patient Re-evaluated prior to induction Oxygen Delivery Method: Circle System Utilized Preoxygenation: Pre-oxygenation with 100% oxygen Induction Type: IV induction Ventilation: Mask ventilation without difficulty Laryngoscope Size: Glidescope and 4 Grade View: Grade I Tube type: Oral Tube size: 7.5 mm Number of attempts: 1 Airway Equipment and Method: Stylet and Oral airway Placement Confirmation: ETT inserted through vocal cords under direct vision,  positive ETCO2 and breath sounds checked- equal and bilateral Secured at: 22 cm Tube secured with: Tape Dental Injury: Teeth and Oropharynx as per pre-operative assessment  Comments: No change in dentition from pre-procedure. C-spine neutrality maintained throughout procedure.

## 2019-02-20 NOTE — Progress Notes (Signed)
Patient arrived the unit From PACU alert and oriented X 4 Denies pain Posterior Cervical surgical incision honeycomb dressing in place, Soft collar in place Hemovac drain in place. Weak left hand grips Bed in the lowest position with bed alarm set. Call light in reach Nurse will continue to monitor

## 2019-02-20 NOTE — Anesthesia Preprocedure Evaluation (Signed)
Anesthesia Evaluation  Patient identified by MRN, date of birth, ID band Patient awake  General Assessment Comment:Patient with widely metastatic prostate carcinoma.  Continues to have bilateral upper extremity sensory loss and weakness with bilateral lower extremity spastic weakness.  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   Reviewed: Allergy & Precautions, NPO status , Patient's Chart, lab work & pertinent test results  Airway Mallampati: II  TM Distance: >3 FB Neck ROM: Limited    Dental no notable dental hx.    Pulmonary neg pulmonary ROS,    Pulmonary exam normal breath sounds clear to auscultation       Cardiovascular negative cardio ROS Normal cardiovascular exam Rhythm:Regular Rate:Normal     Neuro/Psych negative neurological ROS  negative psych ROS   GI/Hepatic negative GI ROS, Neg liver ROS,   Endo/Other  negative endocrine ROS  Renal/GU Renal InsufficiencyRenal disease  negative genitourinary   Musculoskeletal negative musculoskeletal ROS (+)   Abdominal   Peds negative pediatric ROS (+)  Hematology  (+) anemia ,   Anesthesia Other Findings   Reproductive/Obstetrics negative OB ROS                             Anesthesia Physical Anesthesia Plan  ASA: IV  Anesthesia Plan: General   Post-op Pain Management:    Induction: Intravenous  PONV Risk Score and Plan: 2 and Ondansetron, Dexamethasone and Treatment may vary due to age or medical condition  Airway Management Planned: Oral ETT and Video Laryngoscope Planned  Additional Equipment:   Intra-op Plan:   Post-operative Plan: Possible Post-op intubation/ventilation  Informed Consent: I have reviewed the patients History and Physical, chart, labs and discussed the procedure  including the risks, benefits and alternatives for the proposed anesthesia with the patient or authorized representative who has indicated his/her understanding and acceptance.     Dental advisory given  Plan Discussed with: CRNA and Surgeon  Anesthesia Plan Comments:         Anesthesia Quick Evaluation

## 2019-02-20 NOTE — Progress Notes (Signed)
Patient back from MRI alert and Oriented X 4.Nurse gave   2mg  of Ativan prior to MRI, pt. still a little but arousable. Nurse will continue to monitor.

## 2019-02-20 NOTE — Sepsis Progress Note (Signed)
Pt refused 1 mg of Ativan, he insisted he must have 2mg , I paged the physician on call, physician on call,  did not change the order. Pt insisted that he will not go for MRI unless he has 2mg  of Ativan.

## 2019-02-20 NOTE — Transfer of Care (Signed)
Immediate Anesthesia Transfer of Care Note  Patient: Chris Morales  Procedure(s) Performed: CERVICAL LAMINECTOMY FOR TUMOR (Bilateral Spine Cervical)  Patient Location: PACU  Anesthesia Type:General  Level of Consciousness: lethargic  Airway & Oxygen Therapy: Patient Spontanous Breathing  Post-op Assessment: Report given to RN  Post vital signs: Reviewed and stable  Last Vitals:  Vitals Value Taken Time  BP 162/103 02/20/19 1618  Temp    Pulse 85 02/20/19 1619  Resp 22 02/20/19 1619  SpO2 100 % 02/20/19 1619  Vitals shown include unvalidated device data.  Last Pain:  Vitals:   02/20/19 1251  TempSrc: Oral  PainSc:          Complications: No apparent anesthesia complications

## 2019-02-20 NOTE — Progress Notes (Signed)
Orthopedic Tech Progress Note Patient Details:  Chris Morales [birth year] [id number]  Ortho Devices Type of Ortho Device: Soft collar Ortho Device/Splint Interventions: Application   Post Interventions Patient Tolerated: Well   [place] Pink 02/20/2019, 5:22 PM

## 2019-02-21 ENCOUNTER — Inpatient Hospital Stay
Admit: 2019-02-21 | Discharge: 2019-02-21 | Disposition: A | Discharge status: Home / Self Care | Payer: 59 | Attending: Radiation Oncology | Admitting: Radiation Oncology

## 2019-02-21 ENCOUNTER — Encounter (HOSPITAL_COMMUNITY): Payer: Self-pay | Admitting: Neurosurgery

## 2019-02-21 ENCOUNTER — Other Ambulatory Visit: Payer: Self-pay | Admitting: Radiation Therapy

## 2019-02-21 ENCOUNTER — Ambulatory Visit
Admit: 2019-02-21 | Discharge: 2019-02-21 | Disposition: A | Discharge status: Home / Self Care | Payer: MEDICAID | Attending: Radiation Oncology | Admitting: Radiation Oncology

## 2019-02-21 DIAGNOSIS — N1339 Other hydronephrosis: Secondary | ICD-10-CM

## 2019-02-21 DIAGNOSIS — C61 Malignant neoplasm of prostate: Secondary | ICD-10-CM

## 2019-02-21 LAB — HEMOGLOBIN A1C
Hgb A1c MFr Bld: 5.2 % (ref 4.8–5.6)
Mean Plasma Glucose: 103 mg/dL

## 2019-02-21 LAB — CBC
HCT: 37.6 % — ABNORMAL LOW (ref 39.0–52.0)
Hemoglobin: 12.1 g/dL — ABNORMAL LOW (ref 13.0–17.0)
MCH: 28.7 pg (ref 26.0–34.0)
MCHC: 32.2 g/dL (ref 30.0–36.0)
MCV: 89.3 fL (ref 80.0–100.0)
Platelets: 258 10*3/uL (ref 150–400)
RBC: 4.21 MIL/uL — ABNORMAL LOW (ref 4.22–5.81)
RDW: 13.6 % (ref 11.5–15.5)
WBC: 14.8 10*3/uL — ABNORMAL HIGH (ref 4.0–10.5)
nRBC: 0 % (ref 0.0–0.2)

## 2019-02-21 LAB — BASIC METABOLIC PANEL
Anion gap: 8 (ref 5–15)
BUN: 22 mg/dL (ref 8–23)
CO2: 22 mmol/L (ref 22–32)
Calcium: 8.3 mg/dL — ABNORMAL LOW (ref 8.9–10.3)
Chloride: 107 mmol/L (ref 98–111)
Creatinine, Ser: 1.52 mg/dL — ABNORMAL HIGH (ref 0.61–1.24)
GFR calc Af Amer: 56 mL/min — ABNORMAL LOW (ref >60)
GFR calc non Af Amer: 48 mL/min — ABNORMAL LOW (ref >60)
Glucose, Bld: 177 mg/dL — ABNORMAL HIGH (ref 70–99)
Potassium: 4.7 mmol/L (ref 3.5–5.1)
Sodium: 137 mmol/L (ref 135–145)

## 2019-02-21 MED ORDER — METHYLPREDNISOLONE 4 MG PO TBPK
8.0000 mg | ORAL_TABLET | Freq: Every evening | ORAL | Status: AC
  Administered 2019-02-22: 8 mg via ORAL

## 2019-02-21 MED ORDER — METHYLPREDNISOLONE 4 MG PO TBPK
8.0000 mg | ORAL_TABLET | Freq: Every morning | ORAL | Status: AC
  Administered 2019-02-21: 8 mg via ORAL
  Filled 2019-02-21: qty 21

## 2019-02-21 MED ORDER — MUPIROCIN 2 % EX OINT
1.0000 "application " | TOPICAL_OINTMENT | Freq: Two times a day (BID) | CUTANEOUS | Status: DC
  Administered 2019-02-21 – 2019-02-23 (×5): 1 via NASAL
  Filled 2019-02-21: qty 22

## 2019-02-21 MED ORDER — METHYLPREDNISOLONE 4 MG PO TBPK
4.0000 mg | ORAL_TABLET | ORAL | Status: AC
  Administered 2019-02-21: 4 mg via ORAL

## 2019-02-21 MED ORDER — METHYLPREDNISOLONE 4 MG PO TBPK: 4.0000 mg | ORAL_TABLET | Freq: Three times a day (TID) | ORAL | Status: DC

## 2019-02-21 MED ORDER — METHYLPREDNISOLONE 4 MG PO TBPK: 8.0000 mg | ORAL_TABLET | Freq: Every evening | ORAL | Status: DC

## 2019-02-21 MED ORDER — METHYLPREDNISOLONE 4 MG PO TBPK
8.0000 mg | ORAL_TABLET | Freq: Every evening | ORAL | Status: AC
  Administered 2019-02-21: 23:00:00 8 mg via ORAL
  Filled 2019-02-21: qty 21

## 2019-02-21 MED ORDER — METHYLPREDNISOLONE 4 MG PO TBPK
4.0000 mg | ORAL_TABLET | Freq: Three times a day (TID) | ORAL | Status: AC
  Administered 2019-02-22 (×3): 4 mg via ORAL

## 2019-02-21 MED ORDER — METHYLPREDNISOLONE 4 MG PO TBPK
8.0000 mg | ORAL_TABLET | Freq: Every morning | ORAL | Status: DC
  Filled 2019-02-21 (×2): qty 21

## 2019-02-21 MED ORDER — METHYLPREDNISOLONE 4 MG PO TBPK: 4.0000 mg | ORAL_TABLET | ORAL | Status: DC

## 2019-02-21 MED ORDER — METHYLPREDNISOLONE 4 MG PO TBPK: 4.0000 mg | ORAL_TABLET | Freq: Four times a day (QID) | ORAL | Status: DC

## 2019-02-21 MED ORDER — METHYLPREDNISOLONE 4 MG PO TBPK
4.0000 mg | ORAL_TABLET | Freq: Four times a day (QID) | ORAL | Status: DC
  Administered 2019-02-23: 4 mg via ORAL

## 2019-02-21 MED ORDER — DEGARELIX ACETATE(240 MG DOSE) 120 MG/VIAL ~~LOC~~ SOLR
240.0000 mg | Freq: Once | SUBCUTANEOUS | Status: AC
  Administered 2019-02-21: 240 mg via SUBCUTANEOUS
  Filled 2019-02-21: qty 6

## 2019-02-21 NOTE — Progress Notes (Signed)
Inpatient Rehabilitation Admissions Coordinator  Inpatient rehab consult received. Therapy evals and outpatient therapy recommended at this time. Not in need of an inpt rehab at this time.  Danne Baxter, RN, MSN Rehab Admissions Coordinator [phone] 02/21/2019 2:46 PM

## 2019-02-21 NOTE — Progress Notes (Signed)
1 Day Post-Op  Subjective: Chris Morales is doing well s/p cervical decompression for the metastatic prostate cancer and has recovered function in the LUE.  He has a foley and his Cr is falling with bladder drainage.   He has no other complaints at this time.   A biopsy was obtained at the time of surgery.  ROS:  Review of Systems  All other systems reviewed and are negative.   Anti-infectives: Anti-infectives (From admission, onward)   Start     Dose/Rate Route Frequency Ordered Stop   02/20/19 1845  ceFAZolin (ANCEF) IVPB 1 g/50 mL premix     1 g 100 mL/hr over 30 Minutes Intravenous Every 8 hours 02/20/19 1844 02/21/19 1044   02/20/19 1515  bacitracin 50,000 Units in sodium chloride 0.9 % 500 mL irrigation  Status:  Discontinued       As needed 02/20/19 1515 02/20/19 1623   02/20/19 1300  ceFAZolin (ANCEF) IVPB 2g/100 mL premix     2 g 200 mL/hr over 30 Minutes Intravenous 30 min pre-op 02/20/19 1247 02/20/19 1452      Current Facility-Administered Medications  Medication Dose Route Frequency Provider Last Rate Last Dose  . 0.9 %  sodium chloride infusion   Intravenous Continuous Earnie Larsson, MD 75 mL/hr at 02/21/19 0600    . 0.9 %  sodium chloride infusion  250 mL Intravenous Continuous Earnie Larsson, MD      . acetaminophen (TYLENOL) tablet 650 mg  650 mg Oral Q6H PRN Earnie Larsson, MD       Or  . acetaminophen (TYLENOL) suppository 650 mg  650 mg Rectal Q6H PRN Earnie Larsson, MD      . bisacodyl (DULCOLAX) suppository 10 mg  10 mg Rectal Daily PRN Earnie Larsson, MD      . ceFAZolin (ANCEF) IVPB 1 g/50 mL premix  1 g Intravenous Dolores Hoose, MD   Stopped at 02/21/19 0206  . degarelix [facility]) injection 240 mg  240 mg Subcutaneous Once Irine Seal, MD      . dexamethasone (DECADRON) injection 10 mg  10 mg Intravenous Q6H Earnie Larsson, MD   10 mg at 02/21/19 0608  . diazepam (VALIUM) tablet 5-10 mg  5-10 mg Oral Q6H PRN Earnie Larsson, MD      . hydrALAZINE (APRESOLINE) injection 10 mg  10 mg  Intravenous Q6H PRN Earnie Larsson, MD   10 mg at 02/20/19 1637  . HYDROcodone-acetaminophen (NORCO/VICODIN) 5-325 MG per tablet 1-2 tablet  1-2 tablet Oral Q4H PRN Earnie Larsson, MD   2 tablet at 02/20/19 1654  . HYDROmorphone (DILAUDID) injection 1 mg  1 mg Intravenous Q2H PRN Earnie Larsson, MD      . lactated ringers infusion   Intravenous Continuous Earnie Larsson, MD      . LORazepam (ATIVAN) injection 1 mg  1 mg Intravenous Once Earnie Larsson, MD      . menthol-cetylpyridinium (CEPACOL) lozenge 3 mg  1 lozenge Oral PRN Earnie Larsson, MD       Or  . phenol (CHLORASEPTIC) mouth spray 1 spray  1 spray Mouth/Throat PRN Earnie Larsson, MD      . ondansetron [facility]) tablet 4 mg  4 mg Oral Q6H PRN Earnie Larsson, MD       Or  . ondansetron (ZOFRAN) injection 4 mg  4 mg Intravenous Q6H PRN Earnie Larsson, MD      . oxyCODONE (Oxy IR/ROXICODONE) immediate release tablet 10 mg  10 mg Oral Q3H PRN Earnie Larsson, MD      .  polyethylene glycol (MIRALAX / GLYCOLAX) packet 17 g  17 g Oral Daily PRN Earnie Larsson, MD      . sodium chloride flush (NS) 0.9 % injection 3 mL  3 mL Intravenous Q12H Earnie Larsson, MD   3 mL at 02/20/19 2204  . sodium chloride flush (NS) 0.9 % injection 3 mL  3 mL Intravenous PRN Earnie Larsson, MD      . sodium phosphate (FLEET) 7-19 GM/118ML enema 1 enema  1 enema Rectal Once PRN Earnie Larsson, MD      . tamsulosin (FLOMAX) capsule 0.4 mg  0.4 mg Oral Daily Pool, Mallie Mussel, MD         Objective: Vital signs in last 24 hours: Temp:  [97.6 F (36.4 C)-98.1 F (36.7 C)] 98.1 F (36.7 C) (06/23 0347) Pulse Rate:  [69-101] 89 (06/23 0347) Resp:  [15-21] 18 (06/23 0347) BP: (126-166)/(75-106) 131/76 (06/23 0347) SpO2:  [100 %] 100 % (06/23 0347) Arterial Line BP: (177-195)/(76-95) 177/76 (06/22 1647)  Intake/Output from previous day: 06/22 0701 - 06/23 0700 In: 3353.3 [P.O.:920; I.V.:2343; IV Piggyback:90.3] Out: 3515 [Urine:3300; Drains:140; Blood:75] Intake/Output this shift: No intake/output data  recorded.   Physical Exam Vitals signs reviewed.  Constitutional:      Appearance: Normal appearance.  Genitourinary:    Comments: Urine slightly bloody which is not unusual after foley placement for retention.  Neurological:     General: No focal deficit present.     Mental Status: He is alert and oriented to person, place, and time.     Lab Results:  Recent Labs    02/19/19 0712 02/19/19 0721 02/21/19 0539  WBC 5.9  --  14.8*  HGB 11.7* 12.9* 12.1*  HCT 37.0* 38.0* 37.6*  PLT 222  --  258   BMET Recent Labs    02/20/19 0657 02/21/19 0539  NA 138 137  K 4.3 4.7  CL 106 107  CO2 22 22  GLUCOSE 152* 177*  BUN 20 22  CREATININE 1.57* 1.52*  CALCIUM 8.6* 8.3*   PT/INR Recent Labs    02/19/19 0712  LABPROT 14.9  INR 1.2   ABG No results for input(s): PHART, HCO3 in the last 72 hours.  Invalid input(s): PCO2, PO2  Studies/Results: Ct Abdomen Pelvis Wo Contrast  Result Date: 02/19/2019 CLINICAL DATA:  Sclerotic lesions in cervical spine. Evaluate for multiple myeloma or solid organ malignancy. EXAM: CT ABDOMEN AND PELVIS WITHOUT CONTRAST TECHNIQUE: Multidetector CT imaging of the abdomen and pelvis was performed following the standard protocol without IV contrast. COMPARISON:  None. FINDINGS: Lower chest: 7 mm subpleural pulmonary nodule left lower lobe, image 6 series 4. No acute airspace disease. No pleural fluid. No focal airspace disease. No pleural fluid. Hepatobiliary: Evaluation for focal lesion is limited in the absence of IV contrast. Allowing for this, no focal lesion is visualized. Multiple calcified granuloma. Gallbladder physiologically distended, no calcified stone. No biliary dilatation. Pancreas: Evaluation is limited by absence of IV contrast and paucity of intra-abdominal fat. No evidence of focal pancreatic abnormality. No ductal dilatation or inflammation. Spleen: Spleen is normal to small in size spanning 8.1 cm. Multiple calcified granuloma.  Adrenals/Urinary Tract: No evidence of adrenal nodule, adrenal glands poorly visualized. Bilateral hydroureteronephrosis. Ureters are dilated to the bladder insertion. No obvious renal mass on noncontrast exam. The urinary bladder is distended measuring 11.2 x 9.8 x 15.1 cm (volume = 870 cm^3). Mild bladder wall thickening. No obvious bladder mass on noncontrast exam. Stomach/Bowel: Administered enteric contrast reaches the  mid distal small bowel. No obstruction. Stomach physiologically distended without obvious gastric mass. No bowel wall thickening or inflammatory change. Normal appendix. Moderate stool burden throughout the colon. No obvious colonic mass. No colonic wall thickening. Vascular/Lymphatic: Suspected retroperitoneal adenopathy extending from the level of the left renal vein to the iliac bifurcation. Largest node in the left periaortic station measuring 19 mm short axis, image 28 series 3. Lack of IV contrast and paucity of intra-abdominal fat limits assessment. There is left internal and external iliac adenopathy, largest node in the left internal iliac chain measuring 2.3 cm, image 64. Right external iliac/pelvic sidewall adenopathy at 2.2 cm, slightly increased nodal density. Small bilateral inguinal nodes. Reproductive: Prostate gland spans 4.4 x 4.9 cm. Soft tissue prominence of the right seminal vesicle. Other: Minimal fat in the left inguinal canal. No ascites or free air. Musculoskeletal: Sclerotic lesions throughout the included thoracic and lumbar spine. Lesions in every lumbar vertebral body, largest involving L5. Sclerotic lesion and right posterior tenth rib. Multiple sclerotic lesions in the pelvis. IMPRESSION: 1. Findings suspicious for primary prostate malignancy. Bilateral pelvic and retroperitoneal adenopathy. Multifocal sclerotic osseous lesions. Prominent prostate gland with distended urinary bladder and bilateral hydronephrosis. 2. A 7 mm left lower lobe pulmonary nodule is  nonspecific, but metastatic disease is considered. 3. Sequela of prior granulomatous disease with splenic and hepatic calcifications. Electronically Signed   By: Keith Rake M.D.   On: 02/19/2019 22:53   Dg Chest 2 View  Result Date: 02/19/2019 CLINICAL DATA:  Chest pain. EXAM: CHEST - 2 VIEW COMPARISON:  None. FINDINGS: The cardiac silhouette, mediastinal and hilar contours are within normal limits. The lungs are clear of acute process. No infiltrates, edema or effusions. Rounded density in the left lower chest or could reflect a nipple shadow. A follow-up PA chest film with nipple markers may be helpful to exclude a pulmonary nodule. The bony thorax is intact. IMPRESSION: 1. No acute cardiopulmonary findings. 2. Nodular density in the left lower lobe could reflect a nipple shadow or overlapping bony and vascular markings. A repeat PA chest film with nipple markers may be helpful when able, for further evaluation. Electronically Signed   By: Marijo Sanes M.D.   On: 02/19/2019 14:45   Dg Cervical Spine 1 View  Result Date: 02/20/2019 CLINICAL DATA:  Epidural tumor. EXAM: DG CERVICAL SPINE - 1 VIEW COMPARISON:  Cervical MRI dated 02/20/2019 FINDINGS: Lateral view of the cervical spine demonstrates an instrument at the C4-5 level posteriorly. This is the level of the previously demonstrated epidural tumor. Note is made of abnormal sclerosis and patchy lucency in the vertebra at C4, C5, C6, and C7 consistent with the tumor demonstrated on the prior exam. IMPRESSION: Localization instrument at C4-5. Electronically Signed   By: Lorriane Shire M.D.   On: 02/20/2019 16:51   Ct Cervical Spine Wo Contrast  Result Date: 02/19/2019 CLINICAL DATA:  C-spine trauma. Myelopathy.  Abnormal MRI. EXAM: CT CERVICAL SPINE WITHOUT CONTRAST TECHNIQUE: Multidetector CT imaging of the cervical spine was performed without intravenous contrast. Multiplanar CT image reconstructions were also generated. COMPARISON:  MRI  cervical spine 02/19/2019 FINDINGS: Alignment: Reversal of the normal cervical lordotic curve. Skull base and vertebrae: Abnormal sclerosis of the cervical vertebrae, C4 through C7, and the visualized thoracic vertebrae, T1 and T2, also involving the facets and other posterior elements, slight remodeling with loss of height particularly at C5 and C6. Concern for metastatic disease. Soft tissues and spinal canal: The spinal canal is poorly visualized  on this exam. However, correlating with MRI, and given the appearance of the vertebral bodies, posterior epidural tumor at C5 is a differential concern. Disc levels: Near complete loss of interspace height from C4 through C7-T1. This results in varying degrees of foraminal narrowing. Upper chest: Negative. Other: None. IMPRESSION: 1. Abnormal sclerosis of the cervical vertebrae, and visualized upper thoracic vertebrae, with slight remodeling and loss of height particularly at C5 and C6. Concern for metastatic disease, specifically prostate cancer in a male. Less common considerations would include transitional cell carcinoma, mucinous adenocarcinomas, or lymphoma. 2. The spinal canal is poorly visualized on this exam, but given the appearance of the vertebral bodies, posterior epidural tumor at C5 is a differential consideration, along with hemorrhage or infection. 3. Consider MRI with contrast for further evaluation, to evaluate the posterior epidural space. As the noncontrast study was considerably degraded, consider premedication with pain medication or anxiolytics. Electronically Signed   By: Staci Righter M.D.   On: 02/19/2019 15:25   Mr Brain Wo Contrast (neuro Protocol)  Result Date: 02/19/2019 CLINICAL DATA:  Focal neuro deficit greater than 6 hours. Suspect stroke. EXAM: MRI HEAD WITHOUT CONTRAST TECHNIQUE: Multiplanar, multiecho pulse sequences of the brain and surrounding structures were obtained without intravenous contrast. COMPARISON:  CT head  02/19/2019 FINDINGS: Brain: Image quality degraded by motion. Negative for acute or chronic infarction. Ventricle size normal. Negative for mass or edema. Negative for hemorrhage or fluid collection. Vascular: Normal arterial flow voids Skull and upper cervical spine: Normal skull. Abnormal bone marrow throughout the C4 vertebral body. C2 and C3 vertebral bodies normal. Sinuses/Orbits: Mucosal edema paranasal sinuses.  Normal orbit Other: None IMPRESSION: Image quality degraded by motion Negative MRI of the brain without contrast Diffusely abnormal bone marrow at C4.  Possible tumor involvement. Electronically Signed   By: Franchot Gallo M.D.   On: 02/19/2019 11:06   Mr Cervical Spine Wo Contrast  Addendum Date: 02/19/2019   ADDENDUM REPORT: 02/19/2019 13:07 ADDENDUM: After further review and discussion with Dr. Trenton Gammon, there appears to be a posterior epidural process at the C5 bilaterally. This appears to be contributing to spinal stenosis. This has increased signal on T1 and decreased signal intensity on T2. Possible hemorrhage or abscess. MRI cervical spine without with contrast preferably with sedation would be helpful for further evaluation. Also consider CT cervical spine. Electronically Signed   By: Franchot Gallo M.D.   On: 02/19/2019 13:07   Result Date: 02/19/2019 CLINICAL DATA:  Neck pain. Radiculopathy with arm pain and weakness. EXAM: MRI CERVICAL SPINE WITHOUT CONTRAST TECHNIQUE: Multiplanar, multisequence MR imaging of the cervical spine was performed. No intravenous contrast was administered. COMPARISON:  None. FINDINGS: Alignment: Image quality degraded by significant motion. Normal alignment.  Cervical kyphosis. Vertebrae: Negative for fracture. Diffuse bone marrow abnormality in the cervical spine. The vertebral bodies show low signal on T1 and T2 from C4 through T2 with similar changes in the posterior elements. There is also similar change in the posterior elements but not vertebral body of  C3. Cord: Cord evaluation limited by motion however there is possible cord edema throughout the cervical spine from approximately C4 through C6. Posterior Fossa, vertebral arteries, paraspinal tissues: Negative Disc levels: C2-3: Negative C3-4: Mild spinal stenosis. Disc degeneration and diffuse uncinate spurring. Moderate foraminal narrowing bilaterally C4-5: Moderate spinal stenosis. Disc degeneration and spurring. Moderate foraminal stenosis bilaterally C5-6: Disc degeneration and spondylosis. Moderate left foraminal stenosis and mild-to-moderate spinal stenosis C6-7: Disc degeneration and spondylosis. Mild spinal stenosis and  mild foraminal stenosis bilaterally C7-T1: Moderate disc degeneration and spurring. Moderate foraminal stenosis bilaterally. Mild spinal stenosis. IMPRESSION: 1. Image quality degraded by significant motion. 2. 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 3. Cord evaluation limited due to motion however there is suggestion of cord edema throughout much of the cervical spine from C4 through C6. This could be due to myelopathy 4. Diffusely abnormal bone marrow throughout the vertebral bodies and posterior elements from C4-T2. While this pattern could be seen due to advanced disc degeneration, further evaluation for infiltrating neoplastic process such is lymphoma or metastatic disease is warranted. 5. Given the motion degraded study, consider follow-up CT cervical spine. Also, would suggest a follow-up MRI of the cervical spine without with contrast, possibly with sedation, in the near future when the patient is able to hold still. 6. These results were called by telephone at the time of interpretation on 02/19/2019 at 11:20 am to Dr. Charlesetta Shanks , who verbally acknowledged these results. Electronically Signed: By: Franchot Gallo M.D. On: 02/19/2019 11:22   Mr Cervical Spine W Contrast  Result Date:  02/20/2019 CLINICAL DATA:  Myelopathy symptoms. EXAM: MRI CERVICAL SPINE WITH CONTRAST TECHNIQUE: Multiplanar, multisequence MR imaging of the cervical spine was performed following the administration of intravenous contrast. COMPARISON:  Noncontrast MRI the cervical spine 02/19/2019. MRI thoracic and lumbar spine described separately. FINDINGS: Alignment: Straightening of the normal cervical lordosis. Vertebrae: Diffusely abnormal C4 through C7 cervical vertebrae, including posterior elements consistent with metastatic disease. Cord: Significant cord compression maximal at C5, primarily due to bulky epidural tumor. There is a focal deposit in the midline, eccentric to the LEFT, cross-section 8 x 9 x 12 mm. The mass demonstrates T1 shortening precontrast, along with moderate enhancement after administration of gadolinium. Lateral extension of tumor inside the lamina toward the LEFT foramen. Significant narrowing the spinal canal, as narrow as 4 mm. Abnormal dural enhancement, primarily dorsal extends above and below the focal deposit. Posterior Fossa, vertebral arteries, paraspinal tissues: Unremarkable posterior fossa. Disc levels: Unchanged from yesterday's CT. IMPRESSION: Widespread osseous metastatic disease C4 through C7. Additional metastatic disease described separately. Enhancing epidural tumor maximal at the C5 vertebral segment dorsally, eccentric to the LEFT. Significant spinal stenosis and cord compression. 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 Precontrast T1 shortening raises the question of coexistent recent hemorrhage. Electronically Signed   By: Staci Righter M.D.   On: 02/20/2019 12:47   Mr Thoracic Spine W Wo Contrast  Result Date: 02/20/2019 CLINICAL DATA:  Continued surveillance of suspected metastatic disease. Myelopathy. Neck pain. EXAM: MRI THORACIC WITHOUT AND WITH CONTRAST TECHNIQUE: Multiplanar and multiecho pulse sequences of the thoracic  spine were obtained without and with intravenous contrast. CONTRAST:  Gadavist 7 mL. COMPARISON:  MRI cervical spine and lumbar spine reported separately. FINDINGS: MRI THORACIC SPINE FINDINGS Alignment:  Physiologic. Vertebrae: Widespread metastatic disease throughout all visualized thoracic vertebrae, with the greatest involvement in T1, T2, T3, T4, T7, and T11. No pathologic compression fracture. Cord:  Normal signal and morphology.  No epidural tumor. Paraspinal and other soft tissues: No significant paravertebral tumor. Disc levels: No compressive disc protrusion or spinal stenosis. IMPRESSION: Widespread metastatic disease throughout all visualized thoracic vertebrae. No pathologic compression fracture. No epidural tumor. Electronically Signed   By: Staci Righter M.D.   On: 02/20/2019 12:54   Mr Lumbar Spine W Wo Contrast  Result Date: 02/20/2019 CLINICAL DATA:  Continued surveillance  suspected prostate cancer with metastatic disease. EXAM: MRI LUMBAR SPINE WITHOUT AND WITH CONTRAST TECHNIQUE: Multiplanar and multiecho pulse sequences of the lumbar spine were obtained without and with intravenous contrast. CONTRAST:  Gadavist 7 mL. COMPARISON:  MRI cervical and thoracic spine reported separately. Correlate with CT abdomen and pelvis from 02/19/2019. FINDINGS: Segmentation:  Standard. Alignment:  Anatomic Vertebrae: Widespread metastatic disease throughout all visualized lumbar vertebrae. No pathologic fracture. Abnormal foci of signal abnormality representing metastatic disease involve the sacrum and both iliac bones. Conus medullaris and cauda equina: Conus extends to the L1 level. Conus and cauda equina appear normal. No epidural tumor. Paraspinal and other soft tissues: Marked bladder distension due to outlet obstruction, prostatomegaly. BILATERAL hydronephrosis. Retroperitoneal adenopathy. Disc levels: No compressive disc protrusion or spinal stenosis. IMPRESSION: Widespread metastatic disease  throughout all visualized lumbar vertebrae. No pathologic fracture. No epidural tumor. Prostatomegaly, BILATERAL hydronephrosis, and retroperitoneal adenopathy, sequelae of prostate cancer with regional metastases. Electronically Signed   By: Staci Righter M.D.   On: 02/20/2019 13:02   US Renal  Result Date: 02/20/2019 CLINICAL DATA:  63 year old male with hydronephrosis. EXAM: RENAL / URINARY TRACT ULTRASOUND COMPLETE COMPARISON:  CT Abdomen and Pelvis 02/19/2019. FINDINGS: Right Kidney: Renal measurements: 11.6 x 4.9 x 5.9 centimeters = volume: 175 mL. Moderate hydronephrosis appears stable since yesterday. Right hydroureter measures 10 millimeters (image 17). The right kidney appears mildly echogenic on image 2. No right renal mass. Left Kidney: Renal measurements: 10.6 x 6.7 x 5.4 centimeters = volume: 202 mL. Moderate hydronephrosis is stable since yesterday. Left hydroureter partially visible on image 30. Left renal echogenicity appears similar to that on the right. No left renal mass. Bladder: Dilated with evidence of small bladder diverticula (image 36) and trabeculated appearance of the bladder. Estimated bladder volume 751 milliliters. Unchanged postvoid bladder volume. The right ureteral jet is detected with Doppler. There is a small 9-10 millimeter polypoid but nonvascular lesion located dependently within the bladder on images 42 and 43. Other findings: Irregular contour of the prostate which measures 7.3 x 4.8 x 4.8 centimeters (image 44). IMPRESSION: 1. Dilated urinary bladder (751 mL) with no postvoid emptying and stable moderate bilateral hydronephrosis and hydroureter since yesterday. 2. Small 10 mm bladder polyp or dependent tumefactive debris (image 42). Small bladder diverticula also noted. Electronically Signed   By: Genevie Ann M.D.   On: 02/20/2019 09:51    Labs reviewed.    Assessment and Plan: Widely metastatic prostate cancer with cervical spine compression now improving post  decompression.  Urinary retention with hydro and AKI improving after foley placement.  He had questions about sperm banking prior to ADT but I have not found a way to facilitate that for him at this time and after he has reviewed additional information on his condition he is not concerned about that anymore.  I have discussed the options for ADT including orchiectomy, Firmagon and Lupron and reviewed the side effects in detail.   He is agreeable to initiation of Mills Koller which I ordered for today.     He will eventually need a consultation with a urologic oncologist to discuss chemo vs androgen receptor blockade or adrenal androgen synthesis inhibition as an addition to primary ADT.   He will also need to be considered for a bone targeting agent such as Zometa or Xgeva.   He will be returning to [place] and can arrange further evaluation and management there.  I did explain that the ADT may sufficiently relieve the outlet obstruction  but it could take some time and he may need a TURP.   He will need the foley until he is able to arrange urologic f/u in [place].        LOS: 2 days    Irine Seal 02/21/2019 [phone]

## 2019-02-21 NOTE — Progress Notes (Signed)
Physical medicine rehab consult requested chart reviewed.  Patient with surgery just last evening C4-5-6 laminectomy resection of epidural metastatic tumor.  Await therapy evaluations and follow-up with appropriate rehab recommendations

## 2019-02-21 NOTE — Progress Notes (Signed)
Providing Compassionate, Quality Care - Together   Subjective: Patient reports no issues overnight. He reports no pain and feels the strength has fully returned to his hands. He is sitting up in bed upon assessment.  Objective: Vital signs in last 24 hours: Temp:  [97.6 F (36.4 C)-98.3 F (36.8 C)] 98.1 F (36.7 C) (06/23 0347) Pulse Rate:  [69-101] 89 (06/23 0347) Resp:  [15-21] 18 (06/23 0347) BP: (126-166)/(75-106) 131/76 (06/23 0347) SpO2:  [100 %] 100 % (06/23 0347) Arterial Line BP: (177-195)/(76-95) 177/76 (06/22 1647)  Intake/Output from previous day: 06/22 0701 - 06/23 0700 In: 3353.3 [P.O.:920; I.V.:2343; IV Piggyback:90.3] Out: 3515 [Urine:3300; Drains:140; Blood:75] Intake/Output this shift: No intake/output data recorded.  Alert and oriented x 4 PERRLA MAE, Strength 5/5 BUE, BLE CN grossly intact Posterior cervical incision, dry and intact; honeycomb dressing and Hemovac drain in place  Lab Results: Recent Labs    02/19/19 0712 02/19/19 0721 02/21/19 0539  WBC 5.9  --  14.8*  HGB 11.7* 12.9* 12.1*  HCT 37.0* 38.0* 37.6*  PLT 222  --  258   BMET Recent Labs    02/20/19 0657 02/21/19 0539  NA 138 137  K 4.3 4.7  CL 106 107  CO2 22 22  GLUCOSE 152* 177*  BUN 20 22  CREATININE 1.57* 1.52*  CALCIUM 8.6* 8.3*    Studies/Results: Ct Abdomen Pelvis Wo Contrast  Result Date: 02/19/2019 CLINICAL DATA:  Sclerotic lesions in cervical spine. Evaluate for multiple myeloma or solid organ malignancy. EXAM: CT ABDOMEN AND PELVIS WITHOUT CONTRAST TECHNIQUE: Multidetector CT imaging of the abdomen and pelvis was performed following the standard protocol without IV contrast. COMPARISON:  None. FINDINGS: Lower chest: 7 mm subpleural pulmonary nodule left lower lobe, image 6 series 4. No acute airspace disease. No pleural fluid. No focal airspace disease. No pleural fluid. Hepatobiliary: Evaluation for focal lesion is limited in the absence of IV contrast.  Allowing for this, no focal lesion is visualized. Multiple calcified granuloma. Gallbladder physiologically distended, no calcified stone. No biliary dilatation. Pancreas: Evaluation is limited by absence of IV contrast and paucity of intra-abdominal fat. No evidence of focal pancreatic abnormality. No ductal dilatation or inflammation. Spleen: Spleen is normal to small in size spanning 8.1 cm. Multiple calcified granuloma. Adrenals/Urinary Tract: No evidence of adrenal nodule, adrenal glands poorly visualized. Bilateral hydroureteronephrosis. Ureters are dilated to the bladder insertion. No obvious renal mass on noncontrast exam. The urinary bladder is distended measuring 11.2 x 9.8 x 15.1 cm (volume = 870 cm^3). Mild bladder wall thickening. No obvious bladder mass on noncontrast exam. Stomach/Bowel: Administered enteric contrast reaches the mid distal small bowel. No obstruction. Stomach physiologically distended without obvious gastric mass. No bowel wall thickening or inflammatory change. Normal appendix. Moderate stool burden throughout the colon. No obvious colonic mass. No colonic wall thickening. Vascular/Lymphatic: Suspected retroperitoneal adenopathy extending from the level of the left renal vein to the iliac bifurcation. Largest node in the left periaortic station measuring 19 mm short axis, image 28 series 3. Lack of IV contrast and paucity of intra-abdominal fat limits assessment. There is left internal and external iliac adenopathy, largest node in the left internal iliac chain measuring 2.3 cm, image 64. Right external iliac/pelvic sidewall adenopathy at 2.2 cm, slightly increased nodal density. Small bilateral inguinal nodes. Reproductive: Prostate gland spans 4.4 x 4.9 cm. Soft tissue prominence of the right seminal vesicle. Other: Minimal fat in the left inguinal canal. No ascites or free air. Musculoskeletal: Sclerotic lesions throughout  the included thoracic and lumbar spine. Lesions in every  lumbar vertebral body, largest involving L5. Sclerotic lesion and right posterior tenth rib. Multiple sclerotic lesions in the pelvis. IMPRESSION: 1. Findings suspicious for primary prostate malignancy. Bilateral pelvic and retroperitoneal adenopathy. Multifocal sclerotic osseous lesions. Prominent prostate gland with distended urinary bladder and bilateral hydronephrosis. 2. A 7 mm left lower lobe pulmonary nodule is nonspecific, but metastatic disease is considered. 3. Sequela of prior granulomatous disease with splenic and hepatic calcifications. Electronically Signed   By: Keith Rake M.D.   On: 02/19/2019 22:53   Dg Chest 2 View  Result Date: 02/19/2019 CLINICAL DATA:  Chest pain. EXAM: CHEST - 2 VIEW COMPARISON:  None. FINDINGS: The cardiac silhouette, mediastinal and hilar contours are within normal limits. The lungs are clear of acute process. No infiltrates, edema or effusions. Rounded density in the left lower chest or could reflect a nipple shadow. A follow-up PA chest film with nipple markers may be helpful to exclude a pulmonary nodule. The bony thorax is intact. IMPRESSION: 1. No acute cardiopulmonary findings. 2. Nodular density in the left lower lobe could reflect a nipple shadow or overlapping bony and vascular markings. A repeat PA chest film with nipple markers may be helpful when able, for further evaluation. Electronically Signed   By: Marijo Sanes M.D.   On: 02/19/2019 14:45   Dg Cervical Spine 1 View  Result Date: 02/20/2019 CLINICAL DATA:  Epidural tumor. EXAM: DG CERVICAL SPINE - 1 VIEW COMPARISON:  Cervical MRI dated 02/20/2019 FINDINGS: Lateral view of the cervical spine demonstrates an instrument at the C4-5 level posteriorly. This is the level of the previously demonstrated epidural tumor. Note is made of abnormal sclerosis and patchy lucency in the vertebra at C4, C5, C6, and C7 consistent with the tumor demonstrated on the prior exam. IMPRESSION: Localization instrument  at C4-5. Electronically Signed   By: Lorriane Shire M.D.   On: 02/20/2019 16:51   Ct Cervical Spine Wo Contrast  Result Date: 02/19/2019 CLINICAL DATA:  C-spine trauma. Myelopathy.  Abnormal MRI. EXAM: CT CERVICAL SPINE WITHOUT CONTRAST TECHNIQUE: Multidetector CT imaging of the cervical spine was performed without intravenous contrast. Multiplanar CT image reconstructions were also generated. COMPARISON:  MRI cervical spine 02/19/2019 FINDINGS: Alignment: Reversal of the normal cervical lordotic curve. Skull base and vertebrae: Abnormal sclerosis of the cervical vertebrae, C4 through C7, and the visualized thoracic vertebrae, T1 and T2, also involving the facets and other posterior elements, slight remodeling with loss of height particularly at C5 and C6. Concern for metastatic disease. Soft tissues and spinal canal: The spinal canal is poorly visualized on this exam. However, correlating with MRI, and given the appearance of the vertebral bodies, posterior epidural tumor at C5 is a differential concern. Disc levels: Near complete loss of interspace height from C4 through C7-T1. This results in varying degrees of foraminal narrowing. Upper chest: Negative. Other: None. IMPRESSION: 1. Abnormal sclerosis of the cervical vertebrae, and visualized upper thoracic vertebrae, with slight remodeling and loss of height particularly at C5 and C6. Concern for metastatic disease, specifically prostate cancer in a male. Less common considerations would include transitional cell carcinoma, mucinous adenocarcinomas, or lymphoma. 2. The spinal canal is poorly visualized on this exam, but given the appearance of the vertebral bodies, posterior epidural tumor at C5 is a differential consideration, along with hemorrhage or infection. 3. Consider MRI with contrast for further evaluation, to evaluate the posterior epidural space. As the noncontrast study was considerably degraded, consider  premedication with pain medication or  anxiolytics. Electronically Signed   By: Staci Righter M.D.   On: 02/19/2019 15:25   Mr Brain Wo Contrast (neuro Protocol)  Result Date: 02/19/2019 CLINICAL DATA:  Focal neuro deficit greater than 6 hours. Suspect stroke. EXAM: MRI HEAD WITHOUT CONTRAST TECHNIQUE: Multiplanar, multiecho pulse sequences of the brain and surrounding structures were obtained without intravenous contrast. COMPARISON:  CT head 02/19/2019 FINDINGS: Brain: Image quality degraded by motion. Negative for acute or chronic infarction. Ventricle size normal. Negative for mass or edema. Negative for hemorrhage or fluid collection. Vascular: Normal arterial flow voids Skull and upper cervical spine: Normal skull. Abnormal bone marrow throughout the C4 vertebral body. C2 and C3 vertebral bodies normal. Sinuses/Orbits: Mucosal edema paranasal sinuses.  Normal orbit Other: None IMPRESSION: Image quality degraded by motion Negative MRI of the brain without contrast Diffusely abnormal bone marrow at C4.  Possible tumor involvement. Electronically Signed   By: Franchot Gallo M.D.   On: 02/19/2019 11:06   Mr Cervical Spine Wo Contrast  Addendum Date: 02/19/2019   ADDENDUM REPORT: 02/19/2019 13:07 ADDENDUM: After further review and discussion with Dr. Trenton Gammon, there appears to be a posterior epidural process at the C5 bilaterally. This appears to be contributing to spinal stenosis. This has increased signal on T1 and decreased signal intensity on T2. Possible hemorrhage or abscess. MRI cervical spine without with contrast preferably with sedation would be helpful for further evaluation. Also consider CT cervical spine. Electronically Signed   By: Franchot Gallo M.D.   On: 02/19/2019 13:07   Result Date: 02/19/2019 CLINICAL DATA:  Neck pain. Radiculopathy with arm pain and weakness. EXAM: MRI CERVICAL SPINE WITHOUT CONTRAST TECHNIQUE: Multiplanar, multisequence MR imaging of the cervical spine was performed. No intravenous contrast was  administered. COMPARISON:  None. FINDINGS: Alignment: Image quality degraded by significant motion. Normal alignment.  Cervical kyphosis. Vertebrae: Negative for fracture. Diffuse bone marrow abnormality in the cervical spine. The vertebral bodies show low signal on T1 and T2 from C4 through T2 with similar changes in the posterior elements. There is also similar change in the posterior elements but not vertebral body of C3. Cord: Cord evaluation limited by motion however there is possible cord edema throughout the cervical spine from approximately C4 through C6. Posterior Fossa, vertebral arteries, paraspinal tissues: Negative Disc levels: C2-3: Negative C3-4: Mild spinal stenosis. Disc degeneration and diffuse uncinate spurring. Moderate foraminal narrowing bilaterally C4-5: Moderate spinal stenosis. Disc degeneration and spurring. Moderate foraminal stenosis bilaterally C5-6: Disc degeneration and spondylosis. Moderate left foraminal stenosis and mild-to-moderate spinal stenosis C6-7: Disc degeneration and spondylosis. Mild spinal stenosis and mild foraminal stenosis bilaterally C7-T1: Moderate disc degeneration and spurring. Moderate foraminal stenosis bilaterally. Mild spinal stenosis. IMPRESSION: 1. Image quality degraded by significant motion. 2. 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 3. Cord evaluation limited due to motion however there is suggestion of cord edema throughout much of the cervical spine from C4 through C6. This could be due to myelopathy 4. Diffusely abnormal bone marrow throughout the vertebral bodies and posterior elements from C4-T2. While this pattern could be seen due to advanced disc degeneration, further evaluation for infiltrating neoplastic process such is lymphoma or metastatic disease is warranted. 5. Given the motion degraded study, consider follow-up CT cervical spine. Also, would suggest  a follow-up MRI of the cervical spine without with contrast, possibly with sedation, in the near future when the patient is able to  hold still. 6. These results were called by telephone at the time of interpretation on 02/19/2019 at 11:20 am to Dr. Charlesetta Shanks , who verbally acknowledged these results. Electronically Signed: By: Franchot Gallo M.D. On: 02/19/2019 11:22   Mr Cervical Spine W Contrast  Result Date: 02/20/2019 CLINICAL DATA:  Myelopathy symptoms. EXAM: MRI CERVICAL SPINE WITH CONTRAST TECHNIQUE: Multiplanar, multisequence MR imaging of the cervical spine was performed following the administration of intravenous contrast. COMPARISON:  Noncontrast MRI the cervical spine 02/19/2019. MRI thoracic and lumbar spine described separately. FINDINGS: Alignment: Straightening of the normal cervical lordosis. Vertebrae: Diffusely abnormal C4 through C7 cervical vertebrae, including posterior elements consistent with metastatic disease. Cord: Significant cord compression maximal at C5, primarily due to bulky epidural tumor. There is a focal deposit in the midline, eccentric to the LEFT, cross-section 8 x 9 x 12 mm. The mass demonstrates T1 shortening precontrast, along with moderate enhancement after administration of gadolinium. Lateral extension of tumor inside the lamina toward the LEFT foramen. Significant narrowing the spinal canal, as narrow as 4 mm. Abnormal dural enhancement, primarily dorsal extends above and below the focal deposit. Posterior Fossa, vertebral arteries, paraspinal tissues: Unremarkable posterior fossa. Disc levels: Unchanged from yesterday's CT. IMPRESSION: Widespread osseous metastatic disease C4 through C7. Additional metastatic disease described separately. Enhancing epidural tumor maximal at the C5 vertebral segment dorsally, eccentric to the LEFT. Significant spinal stenosis and cord compression. 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  Precontrast T1 shortening raises the question of coexistent recent hemorrhage. Electronically Signed   By: Staci Righter M.D.   On: 02/20/2019 12:47   Mr Thoracic Spine W Wo Contrast  Result Date: 02/20/2019 CLINICAL DATA:  Continued surveillance of suspected metastatic disease. Myelopathy. Neck pain. EXAM: MRI THORACIC WITHOUT AND WITH CONTRAST TECHNIQUE: Multiplanar and multiecho pulse sequences of the thoracic spine were obtained without and with intravenous contrast. CONTRAST:  Gadavist 7 mL. COMPARISON:  MRI cervical spine and lumbar spine reported separately. FINDINGS: MRI THORACIC SPINE FINDINGS Alignment:  Physiologic. Vertebrae: Widespread metastatic disease throughout all visualized thoracic vertebrae, with the greatest involvement in T1, T2, T3, T4, T7, and T11. No pathologic compression fracture. Cord:  Normal signal and morphology.  No epidural tumor. Paraspinal and other soft tissues: No significant paravertebral tumor. Disc levels: No compressive disc protrusion or spinal stenosis. IMPRESSION: Widespread metastatic disease throughout all visualized thoracic vertebrae. No pathologic compression fracture. No epidural tumor. Electronically Signed   By: Staci Righter M.D.   On: 02/20/2019 12:54   Mr Lumbar Spine W Wo Contrast  Result Date: 02/20/2019 CLINICAL DATA:  Continued surveillance suspected prostate cancer with metastatic disease. EXAM: MRI LUMBAR SPINE WITHOUT AND WITH CONTRAST TECHNIQUE: Multiplanar and multiecho pulse sequences of the lumbar spine were obtained without and with intravenous contrast. CONTRAST:  Gadavist 7 mL. COMPARISON:  MRI cervical and thoracic spine reported separately. Correlate with CT abdomen and pelvis from 02/19/2019. FINDINGS: Segmentation:  Standard. Alignment:  Anatomic Vertebrae: Widespread metastatic disease throughout all visualized lumbar vertebrae. No pathologic fracture. Abnormal foci of signal abnormality representing metastatic disease involve the  sacrum and both iliac bones. Conus medullaris and cauda equina: Conus extends to the L1 level. Conus and cauda equina appear normal. No epidural tumor. Paraspinal and other soft tissues: Marked bladder distension due to outlet obstruction, prostatomegaly. BILATERAL hydronephrosis. Retroperitoneal adenopathy. Disc levels: No compressive disc protrusion or spinal stenosis. IMPRESSION: Widespread metastatic disease throughout all visualized lumbar vertebrae. No pathologic fracture. No epidural tumor. Prostatomegaly,  BILATERAL hydronephrosis, and retroperitoneal adenopathy, sequelae of prostate cancer with regional metastases. Electronically Signed   By: Staci Righter M.D.   On: 02/20/2019 13:02   US Renal  Result Date: 02/20/2019 CLINICAL DATA:  63 year old male with hydronephrosis. EXAM: RENAL / URINARY TRACT ULTRASOUND COMPLETE COMPARISON:  CT Abdomen and Pelvis 02/19/2019. FINDINGS: Right Kidney: Renal measurements: 11.6 x 4.9 x 5.9 centimeters = volume: 175 mL. Moderate hydronephrosis appears stable since yesterday. Right hydroureter measures 10 millimeters (image 17). The right kidney appears mildly echogenic on image 2. No right renal mass. Left Kidney: Renal measurements: 10.6 x 6.7 x 5.4 centimeters = volume: 202 mL. Moderate hydronephrosis is stable since yesterday. Left hydroureter partially visible on image 30. Left renal echogenicity appears similar to that on the right. No left renal mass. Bladder: Dilated with evidence of small bladder diverticula (image 36) and trabeculated appearance of the bladder. Estimated bladder volume 751 milliliters. Unchanged postvoid bladder volume. The right ureteral jet is detected with Doppler. There is a small 9-10 millimeter polypoid but nonvascular lesion located dependently within the bladder on images 42 and 43. Other findings: Irregular contour of the prostate which measures 7.3 x 4.8 x 4.8 centimeters (image 44). IMPRESSION: 1. Dilated urinary bladder (751 mL)  with no postvoid emptying and stable moderate bilateral hydronephrosis and hydroureter since yesterday. 2. Small 10 mm bladder polyp or dependent tumefactive debris (image 42). Small bladder diverticula also noted. Electronically Signed   By: Genevie Ann M.D.   On: 02/20/2019 09:51    Assessment/Plan: Patient is one day status post C4, C5, and C6 laminectomy and resection of epidural metastatic tumor. He is doing very well post operatively. Hemovac drain with 140 mL sanguinous drainage overnight. Spoke with Dr. Sondra Come of radiation oncology, who will discuss typical plan of care for post operative radiation with patient.   LOS: 2 days    -Pathology pending -Mobilize with therapies -Radiation treatment would not begin until at least 2 weeks post op   Viona Gilmore, DNP, AGNP-C Nurse Practitioner  [facility] [place]. [address], [place], [place] [address] P: [phone]     F: [phone]  02/21/2019, 8:29 AM

## 2019-02-21 NOTE — Progress Notes (Signed)
[facility]                                                                              Patient Demographics  Chris Morales, is a 63 y.o. male, DOB - [birth year], KZS:[id number]  Admit date - 02/19/2019   Admitting Physician Earnie Larsson, MD  Outpatient Primary MD for the patient is patient visiting from [facility]:   LOS - 2  days   Medical records reviewed and are as summarized below:    No chief complaint on file.      Brief summary   Patient is a 63 year old male with no significant past medical history who resides in [place] and works as a Pharmacist, community resented with progressive weakness in his upper extremity left more than right, electrical sensations in his arms.  Patient reported that his symptoms started a week ago after he woke up and felt a "crick" in his cervical spine, felt a difficulty with gait.  No bowel or bladder dysfunction although he felt that he had increased urinary urgency, frequency and felt it could be due to his prostate. COVID-19 test negative  Assessment & Plan    Principal Problem:   Cervical myelopathy ([place]) with progressive weakness in his arms, tingling, left worse than right secondary to cervical spine compression from metastasis -CT cervical spine showed abnormal sclerosis in the cervical vertebrae and upper thoracic vertebrae particularly at C5-C6, concern for metastatic disease specially prostate CA, lymphoma or other carcinomas.  Given appearance of the vertebral bodies, posterior epidural tumor at C5 is also differential with hemorrhage or infection. 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  Patient was having symptoms of urinary frequency  and urgency and attributed probably due to possibly having BPH -MRI of the cervical, thoracic, lumbar spine showed widespread osseous metastatic disease C4-C7, enhancing epidural tumor at C5.  Widespread metastatic disease throughout all visualized thoracic and lumbar vertebrae -Urology following, seen by Dr. Jeffie Pollock, started on Firmagon today, he is not particularly interested in orchiectomy at this time.  Follow urology recommendations while inpatient, will need further management in [place].     AKI (acute kidney injury) ([place]) -Unclear baseline creatinine, 1.7 at the time of admission -Renal ultrasound shows bilateral hydronephrosis, distended bladder.   - Foley catheter placed  -Continue IV fluid hydration, creatinine improving    Anemia: Likely due to underlying malignancy, normocytic -Anemia panel shows likely anemia of chronic disease - per patient no hematuria, hematochezia or melena.  Hyperglycemia -No known history of diabetes mellitus -Hemoglobin A1c 5.2  Hypertension -Elevated BP readings on admit, patient reports no prior history of hypertension -BP improving   Code Status: Full CODE STATUS DVT Prophylaxis:  SCD's Family Communication: Discussed in detail with the patient, all imaging results, lab results explained to the patient    Disposition Plan: Postop day #1, remains inpatient  Time Spent in minutes  45 minutes  Procedures:  C4, C5, C6 laminectomy and resection of epidural metastatic tumor, microdiscectomy   Consultants:   Neurosurgery Urology, Dr. Jeffie Pollock  Antimicrobials:   Anti-infectives (From admission, onward)   Start     Dose/Rate Route Frequency Ordered Stop   02/20/19 1845  ceFAZolin (ANCEF) IVPB 1 g/50 mL premix     1 g 100 mL/hr over 30 Minutes Intravenous Every 8 hours 02/20/19 1844 02/21/19 1044   02/20/19 1515  bacitracin 50,000 Units in sodium chloride 0.9 % 500 mL irrigation  Status:  Discontinued       As needed 02/20/19 1515 02/20/19 1623     02/20/19 1300  ceFAZolin (ANCEF) IVPB 2g/100 mL premix     2 g 200 mL/hr over 30 Minutes Intravenous 30 min pre-op 02/20/19 1247 02/20/19 1452         Medications  Scheduled Meds:  methylPREDNISolone  4 mg Oral PC lunch   methylPREDNISolone  4 mg Oral PC supper   [START ON 02/22/2019] methylPREDNISolone  4 mg Oral 3 x daily with food   [START ON 02/23/2019] methylPREDNISolone  4 mg Oral 4X daily taper   methylPREDNISolone  8 mg Oral AC breakfast   methylPREDNISolone  8 mg Oral Nightly   [START ON 02/22/2019] methylPREDNISolone  8 mg Oral Nightly   sodium chloride flush  3 mL Intravenous Q12H   tamsulosin  0.4 mg Oral Daily   Continuous Infusions:  sodium chloride 75 mL/hr at 02/21/19 0600   sodium chloride     lactated ringers     PRN Meds:.acetaminophen **OR** acetaminophen, bisacodyl, diazepam, hydrALAZINE, HYDROcodone-acetaminophen, HYDROmorphone (DILAUDID) injection, menthol-cetylpyridinium **OR** phenol, ondansetron **OR** ondansetron (ZOFRAN) IV, oxyCODONE, polyethylene glycol, sodium chloride flush, sodium phosphate      Subjective:   Chris Morales was seen and examined today.  States weakness in the upper extremities much improved after the laminectomy.  Currently no tingling sensation.  Cervical collar on.  Denies anydizziness, chest pain, shortness of breath, abdominal pain, N/V/D/C.  No fevers.    Objective:   Vitals:   02/20/19 2317 02/21/19 0347 02/21/19 0957 02/21/19 1207  BP: 133/81 131/76 (!) 143/101 (!) 161/97  Pulse: 69 89 77 84  Resp: _0 Temp: 98 F (36.7 C) 98.1 F (36.7 C) 98.2 F (36.8 C) 98.3 F (36.8 C)  TempSrc: Oral Oral Oral Oral  SpO2: 100% 100% 99% 100%  Weight:      Height:        Intake/Output Summary (Last 24 hours) at 02/21/2019 1239 Last data filed at 02/21/2019 0600 Gross per 24 hour  Intake 3353.33 ml  Output 3515 ml  Net -161.67 ml     Wt Readings from Last 3 Encounters:  02/19/19 73.9 kg     Physical Exam  General: Alert and oriented x 3, NAD  Eyes:   HEENT:  Atraumatic, normocephalic  Cardiovascular: S1 S2 clear,  RRR. No pedal edema b/l  Respiratory: CTAB, no wheezing, rales or rhonchi  Gastrointestinal: Soft, nontender, nondistended, NBS  Ext: no pedal edema bilaterally  Neuro: no new deficits, LUE weakness significantly improved  Musculoskeletal: No cyanosis, clubbing  Skin: No rashes  Psych: Normal affect and demeanor, alert and oriented x3    Data Reviewed:  I have personally reviewed following labs and imaging studies  Micro Results Recent Results (from the past 240 hour(s))  SARS Coronavirus 2 (CEPHEID - Performed in [facility] lab), Hosp Order     Status: None  Collection Time: 02/19/19  4:21 PM   Specimen: Nasopharyngeal Swab  Result Value Ref Range Status   SARS Coronavirus 2 NEGATIVE NEGATIVE Final    Comment: (NOTE) If result is NEGATIVE SARS-CoV-2 target nucleic acids are NOT DETECTED. The SARS-CoV-2 RNA is generally detectable in upper and lower  respiratory specimens during the acute phase of infection. The lowest  concentration of SARS-CoV-2 viral copies this assay can detect is 250  copies / mL. A negative result does not preclude SARS-CoV-2 infection  and should not be used as the sole basis for treatment or other  patient management decisions.  A negative result may occur with  improper specimen collection / handling, submission of specimen other  than nasopharyngeal swab, presence of viral mutation(s) within the  areas targeted by this assay, and inadequate number of viral copies  (<250 copies / mL). A negative result must be combined with clinical  observations, patient history, and epidemiological information. If result is POSITIVE SARS-CoV-2 target nucleic acids are DETECTED. The SARS-CoV-2 RNA is generally detectable in upper and lower  respiratory specimens dur ing the acute phase of infection.  Positive   results are indicative of active infection with SARS-CoV-2.  Clinical  correlation with patient history and other diagnostic information is  necessary to determine patient infection status.  Positive results do  not rule out bacterial infection or co-infection with other viruses. If result is PRESUMPTIVE POSTIVE SARS-CoV-2 nucleic acids MAY BE PRESENT.   A presumptive positive result was obtained on the submitted specimen  and confirmed on repeat testing.  While 2019 novel coronavirus  (SARS-CoV-2) nucleic acids may be present in the submitted sample  additional confirmatory testing may be necessary for epidemiological  and / or clinical management purposes  to differentiate between  SARS-CoV-2 and other Sarbecovirus currently known to infect humans.  If clinically indicated additional testing with an alternate test  methodology [id number]) is advised. The SARS-CoV-2 RNA is generally  detectable in upper and lower respiratory sp ecimens during the acute  phase of infection. The expected result is Negative. Fact Sheet for Patients:  StrictlyIdeas.no Fact Sheet for Healthcare Providers: BankingDealers.co.za This test is not yet approved or cleared by the [place] FDA and has been authorized for detection and/or diagnosis of SARS-CoV-2 by FDA under an Emergency Use Authorization (EUA).  This EUA will remain in effect (meaning this test can be used) for the duration of the COVID-19 declaration under Section 564(b)(1) of the Act, 21 U.S.C. section 360bbb-3(b)(1), unless the authorization is terminated or revoked sooner. Performed at [facility], [place] [address], [place], [place] [address]   Surgical pcr screen     Status: Abnormal   Collection Time: 02/20/19  1:06 PM   Specimen: Nasal Mucosa; Nasal Swab  Result Value Ref Range Status   MRSA, PCR POSITIVE (A) NEGATIVE Final    Comment: RESULT CALLED TO, READ BACK BY AND VERIFIED  WITH: Cleotis Nipper RN 14:45 02/20/19 ([email or web address])    Staphylococcus aureus POSITIVE (A) NEGATIVE Final    Comment: (NOTE) The Xpert SA Assay (FDA approved for NASAL specimens in patients 107 years of age and older), is one component of a comprehensive surveillance program. It is not intended to diagnose infection nor to guide or monitor treatment. Performed at [facility], [place] [address], [place], [place] [address]     Radiology Reports Ct Abdomen Pelvis Wo Contrast  Result Date: 02/19/2019 CLINICAL DATA:  Sclerotic lesions in cervical spine. Evaluate for multiple  myeloma or solid organ malignancy. EXAM: CT ABDOMEN AND PELVIS WITHOUT CONTRAST TECHNIQUE: Multidetector CT imaging of the abdomen and pelvis was performed following the standard protocol without IV contrast. COMPARISON:  None. FINDINGS: Lower chest: 7 mm subpleural pulmonary nodule left lower lobe, image 6 series 4. No acute airspace disease. No pleural fluid. No focal airspace disease. No pleural fluid. Hepatobiliary: Evaluation for focal lesion is limited in the absence of IV contrast. Allowing for this, no focal lesion is visualized. Multiple calcified granuloma. Gallbladder physiologically distended, no calcified stone. No biliary dilatation. Pancreas: Evaluation is limited by absence of IV contrast and paucity of intra-abdominal fat. No evidence of focal pancreatic abnormality. No ductal dilatation or inflammation. Spleen: Spleen is normal to small in size spanning 8.1 cm. Multiple calcified granuloma. Adrenals/Urinary Tract: No evidence of adrenal nodule, adrenal glands poorly visualized. Bilateral hydroureteronephrosis. Ureters are dilated to the bladder insertion. No obvious renal mass on noncontrast exam. The urinary bladder is distended measuring 11.2 x 9.8 x 15.1 cm (volume = 870 cm^3). Mild bladder wall thickening. No obvious bladder mass on noncontrast exam. Stomach/Bowel: Administered enteric contrast reaches the mid distal  small bowel. No obstruction. Stomach physiologically distended without obvious gastric mass. No bowel wall thickening or inflammatory change. Normal appendix. Moderate stool burden throughout the colon. No obvious colonic mass. No colonic wall thickening. Vascular/Lymphatic: Suspected retroperitoneal adenopathy extending from the level of the left renal vein to the iliac bifurcation. Largest node in the left periaortic station measuring 19 mm short axis, image 28 series 3. Lack of IV contrast and paucity of intra-abdominal fat limits assessment. There is left internal and external iliac adenopathy, largest node in the left internal iliac chain measuring 2.3 cm, image 64. Right external iliac/pelvic sidewall adenopathy at 2.2 cm, slightly increased nodal density. Small bilateral inguinal nodes. Reproductive: Prostate gland spans 4.4 x 4.9 cm. Soft tissue prominence of the right seminal vesicle. Other: Minimal fat in the left inguinal canal. No ascites or free air. Musculoskeletal: Sclerotic lesions throughout the included thoracic and lumbar spine. Lesions in every lumbar vertebral body, largest involving L5. Sclerotic lesion and right posterior tenth rib. Multiple sclerotic lesions in the pelvis. IMPRESSION: 1. Findings suspicious for primary prostate malignancy. Bilateral pelvic and retroperitoneal adenopathy. Multifocal sclerotic osseous lesions. Prominent prostate gland with distended urinary bladder and bilateral hydronephrosis. 2. A 7 mm left lower lobe pulmonary nodule is nonspecific, but metastatic disease is considered. 3. Sequela of prior granulomatous disease with splenic and hepatic calcifications. Electronically Signed   By: Keith Rake M.D.   On: 02/19/2019 22:53   Dg Chest 2 View  Result Date: 02/19/2019 CLINICAL DATA:  Chest pain. EXAM: CHEST - 2 VIEW COMPARISON:  None. FINDINGS: The cardiac silhouette, mediastinal and hilar contours are within normal limits. The lungs are clear of acute  process. No infiltrates, edema or effusions. Rounded density in the left lower chest or could reflect a nipple shadow. A follow-up PA chest film with nipple markers may be helpful to exclude a pulmonary nodule. The bony thorax is intact. IMPRESSION: 1. No acute cardiopulmonary findings. 2. Nodular density in the left lower lobe could reflect a nipple shadow or overlapping bony and vascular markings. A repeat PA chest film with nipple markers may be helpful when able, for further evaluation. Electronically Signed   By: Marijo Sanes M.D.   On: 02/19/2019 14:45   Dg Cervical Spine 1 View  Result Date: 02/20/2019 CLINICAL DATA:  Epidural tumor. EXAM: DG CERVICAL SPINE - 1  VIEW COMPARISON:  Cervical MRI dated 02/20/2019 FINDINGS: Lateral view of the cervical spine demonstrates an instrument at the C4-5 level posteriorly. This is the level of the previously demonstrated epidural tumor. Note is made of abnormal sclerosis and patchy lucency in the vertebra at C4, C5, C6, and C7 consistent with the tumor demonstrated on the prior exam. IMPRESSION: Localization instrument at C4-5. Electronically Signed   By: Lorriane Shire M.D.   On: 02/20/2019 16:51   Ct Head Wo Contrast  Result Date: 02/19/2019 CLINICAL DATA:  63 year old and possible stroke. EXAM: CT HEAD WITHOUT CONTRAST TECHNIQUE: Contiguous axial images were obtained from the base of the skull through the vertex without intravenous contrast. COMPARISON:  06/18/2014 FINDINGS: Brain: No evidence of acute infarction, hemorrhage, hydrocephalus, extra-axial collection or mass lesion/mass effect. Vascular: No hyperdense vessel or unexpected calcification. Skull: Normal. Negative for fracture or focal lesion. Sinuses/Orbits: Mucosal disease in the right maxillary sinus which is partially imaged. Other: None IMPRESSION: No acute intracranial abnormality. Right maxillary sinus disease. Electronically Signed   By: Markus Daft M.D.   On: 02/19/2019 08:24   Ct Cervical  Spine Wo Contrast  Result Date: 02/19/2019 CLINICAL DATA:  C-spine trauma. Myelopathy.  Abnormal MRI. EXAM: CT CERVICAL SPINE WITHOUT CONTRAST TECHNIQUE: Multidetector CT imaging of the cervical spine was performed without intravenous contrast. Multiplanar CT image reconstructions were also generated. COMPARISON:  MRI cervical spine 02/19/2019 FINDINGS: Alignment: Reversal of the normal cervical lordotic curve. Skull base and vertebrae: Abnormal sclerosis of the cervical vertebrae, C4 through C7, and the visualized thoracic vertebrae, T1 and T2, also involving the facets and other posterior elements, slight remodeling with loss of height particularly at C5 and C6. Concern for metastatic disease. Soft tissues and spinal canal: The spinal canal is poorly visualized on this exam. However, correlating with MRI, and given the appearance of the vertebral bodies, posterior epidural tumor at C5 is a differential concern. Disc levels: Near complete loss of interspace height from C4 through C7-T1. This results in varying degrees of foraminal narrowing. Upper chest: Negative. Other: None. IMPRESSION: 1. Abnormal sclerosis of the cervical vertebrae, and visualized upper thoracic vertebrae, with slight remodeling and loss of height particularly at C5 and C6. Concern for metastatic disease, specifically prostate cancer in a male. Less common considerations would include transitional cell carcinoma, mucinous adenocarcinomas, or lymphoma. 2. The spinal canal is poorly visualized on this exam, but given the appearance of the vertebral bodies, posterior epidural tumor at C5 is a differential consideration, along with hemorrhage or infection. 3. Consider MRI with contrast for further evaluation, to evaluate the posterior epidural space. As the noncontrast study was considerably degraded, consider premedication with pain medication or anxiolytics. Electronically Signed   By: Staci Righter M.D.   On: 02/19/2019 15:25   Mr Brain Wo  Contrast (neuro Protocol)  Result Date: 02/19/2019 CLINICAL DATA:  Focal neuro deficit greater than 6 hours. Suspect stroke. EXAM: MRI HEAD WITHOUT CONTRAST TECHNIQUE: Multiplanar, multiecho pulse sequences of the brain and surrounding structures were obtained without intravenous contrast. COMPARISON:  CT head 02/19/2019 FINDINGS: Brain: Image quality degraded by motion. Negative for acute or chronic infarction. Ventricle size normal. Negative for mass or edema. Negative for hemorrhage or fluid collection. Vascular: Normal arterial flow voids Skull and upper cervical spine: Normal skull. Abnormal bone marrow throughout the C4 vertebral body. C2 and C3 vertebral bodies normal. Sinuses/Orbits: Mucosal edema paranasal sinuses.  Normal orbit Other: None IMPRESSION: Image quality degraded by motion Negative MRI of the brain  without contrast Diffusely abnormal bone marrow at C4.  Possible tumor involvement. Electronically Signed   By: Franchot Gallo M.D.   On: 02/19/2019 11:06   Mr Cervical Spine Wo Contrast  Addendum Date: 02/19/2019   ADDENDUM REPORT: 02/19/2019 13:07 ADDENDUM: After further review and discussion with Dr. Trenton Gammon, there appears to be a posterior epidural process at the C5 bilaterally. This appears to be contributing to spinal stenosis. This has increased signal on T1 and decreased signal intensity on T2. Possible hemorrhage or abscess. MRI cervical spine without with contrast preferably with sedation would be helpful for further evaluation. Also consider CT cervical spine. Electronically Signed   By: Franchot Gallo M.D.   On: 02/19/2019 13:07   Result Date: 02/19/2019 CLINICAL DATA:  Neck pain. Radiculopathy with arm pain and weakness. EXAM: MRI CERVICAL SPINE WITHOUT CONTRAST TECHNIQUE: Multiplanar, multisequence MR imaging of the cervical spine was performed. No intravenous contrast was administered. COMPARISON:  None. FINDINGS: Alignment: Image quality degraded by significant motion. Normal  alignment.  Cervical kyphosis. Vertebrae: Negative for fracture. Diffuse bone marrow abnormality in the cervical spine. The vertebral bodies show low signal on T1 and T2 from C4 through T2 with similar changes in the posterior elements. There is also similar change in the posterior elements but not vertebral body of C3. Cord: Cord evaluation limited by motion however there is possible cord edema throughout the cervical spine from approximately C4 through C6. Posterior Fossa, vertebral arteries, paraspinal tissues: Negative Disc levels: C2-3: Negative C3-4: Mild spinal stenosis. Disc degeneration and diffuse uncinate spurring. Moderate foraminal narrowing bilaterally C4-5: Moderate spinal stenosis. Disc degeneration and spurring. Moderate foraminal stenosis bilaterally C5-6: Disc degeneration and spondylosis. Moderate left foraminal stenosis and mild-to-moderate spinal stenosis C6-7: Disc degeneration and spondylosis. Mild spinal stenosis and mild foraminal stenosis bilaterally C7-T1: Moderate disc degeneration and spurring. Moderate foraminal stenosis bilaterally. Mild spinal stenosis. IMPRESSION: 1. Image quality degraded by significant motion. 2. 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 3. Cord evaluation limited due to motion however there is suggestion of cord edema throughout much of the cervical spine from C4 through C6. This could be due to myelopathy 4. Diffusely abnormal bone marrow throughout the vertebral bodies and posterior elements from C4-T2. While this pattern could be seen due to advanced disc degeneration, further evaluation for infiltrating neoplastic process such is lymphoma or metastatic disease is warranted. 5. Given the motion degraded study, consider follow-up CT cervical spine. Also, would suggest a follow-up MRI of the cervical spine without with contrast, possibly with sedation, in the near future  when the patient is able to hold still. 6. These results were called by telephone at the time of interpretation on 02/19/2019 at 11:20 am to Dr. Charlesetta Shanks , who verbally acknowledged these results. Electronically Signed: By: Franchot Gallo M.D. On: 02/19/2019 11:22   Mr Cervical Spine W Contrast  Result Date: 02/20/2019 CLINICAL DATA:  Myelopathy symptoms. EXAM: MRI CERVICAL SPINE WITH CONTRAST TECHNIQUE: Multiplanar, multisequence MR imaging of the cervical spine was performed following the administration of intravenous contrast. COMPARISON:  Noncontrast MRI the cervical spine 02/19/2019. MRI thoracic and lumbar spine described separately. FINDINGS: Alignment: Straightening of the normal cervical lordosis. Vertebrae: Diffusely abnormal C4 through C7 cervical vertebrae, including posterior elements consistent with metastatic disease. Cord: Significant cord compression maximal at C5, primarily due to bulky epidural tumor. There is a focal deposit in the midline, eccentric to the LEFT, cross-section 8 x 9  x 12 mm. The mass demonstrates T1 shortening precontrast, along with moderate enhancement after administration of gadolinium. Lateral extension of tumor inside the lamina toward the LEFT foramen. Significant narrowing the spinal canal, as narrow as 4 mm. Abnormal dural enhancement, primarily dorsal extends above and below the focal deposit. Posterior Fossa, vertebral arteries, paraspinal tissues: Unremarkable posterior fossa. Disc levels: Unchanged from yesterday's CT. IMPRESSION: Widespread osseous metastatic disease C4 through C7. Additional metastatic disease described separately. Enhancing epidural tumor maximal at the C5 vertebral segment dorsally, eccentric to the LEFT. Significant spinal stenosis and cord compression. 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 Precontrast T1 shortening raises the question of coexistent recent hemorrhage. Electronically Signed   By:  Staci Righter M.D.   On: 02/20/2019 12:47   Mr Thoracic Spine W Wo Contrast  Result Date: 02/20/2019 CLINICAL DATA:  Continued surveillance of suspected metastatic disease. Myelopathy. Neck pain. EXAM: MRI THORACIC WITHOUT AND WITH CONTRAST TECHNIQUE: Multiplanar and multiecho pulse sequences of the thoracic spine were obtained without and with intravenous contrast. CONTRAST:  Gadavist 7 mL. COMPARISON:  MRI cervical spine and lumbar spine reported separately. FINDINGS: MRI THORACIC SPINE FINDINGS Alignment:  Physiologic. Vertebrae: Widespread metastatic disease throughout all visualized thoracic vertebrae, with the greatest involvement in T1, T2, T3, T4, T7, and T11. No pathologic compression fracture. Cord:  Normal signal and morphology.  No epidural tumor. Paraspinal and other soft tissues: No significant paravertebral tumor. Disc levels: No compressive disc protrusion or spinal stenosis. IMPRESSION: Widespread metastatic disease throughout all visualized thoracic vertebrae. No pathologic compression fracture. No epidural tumor. Electronically Signed   By: Staci Righter M.D.   On: 02/20/2019 12:54   Mr Lumbar Spine W Wo Contrast  Result Date: 02/20/2019 CLINICAL DATA:  Continued surveillance suspected prostate cancer with metastatic disease. EXAM: MRI LUMBAR SPINE WITHOUT AND WITH CONTRAST TECHNIQUE: Multiplanar and multiecho pulse sequences of the lumbar spine were obtained without and with intravenous contrast. CONTRAST:  Gadavist 7 mL. COMPARISON:  MRI cervical and thoracic spine reported separately. Correlate with CT abdomen and pelvis from 02/19/2019. FINDINGS: Segmentation:  Standard. Alignment:  Anatomic Vertebrae: Widespread metastatic disease throughout all visualized lumbar vertebrae. No pathologic fracture. Abnormal foci of signal abnormality representing metastatic disease involve the sacrum and both iliac bones. Conus medullaris and cauda equina: Conus extends to the L1 level. Conus and cauda  equina appear normal. No epidural tumor. Paraspinal and other soft tissues: Marked bladder distension due to outlet obstruction, prostatomegaly. BILATERAL hydronephrosis. Retroperitoneal adenopathy. Disc levels: No compressive disc protrusion or spinal stenosis. IMPRESSION: Widespread metastatic disease throughout all visualized lumbar vertebrae. No pathologic fracture. No epidural tumor. Prostatomegaly, BILATERAL hydronephrosis, and retroperitoneal adenopathy, sequelae of prostate cancer with regional metastases. Electronically Signed   By: Staci Righter M.D.   On: 02/20/2019 13:02   US Renal  Result Date: 02/20/2019 CLINICAL DATA:  63 year old male with hydronephrosis. EXAM: RENAL / URINARY TRACT ULTRASOUND COMPLETE COMPARISON:  CT Abdomen and Pelvis 02/19/2019. FINDINGS: Right Kidney: Renal measurements: 11.6 x 4.9 x 5.9 centimeters = volume: 175 mL. Moderate hydronephrosis appears stable since yesterday. Right hydroureter measures 10 millimeters (image 17). The right kidney appears mildly echogenic on image 2. No right renal mass. Left Kidney: Renal measurements: 10.6 x 6.7 x 5.4 centimeters = volume: 202 mL. Moderate hydronephrosis is stable since yesterday. Left hydroureter partially visible on image 30. Left renal echogenicity appears similar to that on the right. No left renal mass. Bladder: Dilated with evidence of small bladder diverticula (  image 36) and trabeculated appearance of the bladder. Estimated bladder volume 751 milliliters. Unchanged postvoid bladder volume. The right ureteral jet is detected with Doppler. There is a small 9-10 millimeter polypoid but nonvascular lesion located dependently within the bladder on images 42 and 43. Other findings: Irregular contour of the prostate which measures 7.3 x 4.8 x 4.8 centimeters (image 44). IMPRESSION: 1. Dilated urinary bladder (751 mL) with no postvoid emptying and stable moderate bilateral hydronephrosis and hydroureter since yesterday. 2. Small  10 mm bladder polyp or dependent tumefactive debris (image 42). Small bladder diverticula also noted. Electronically Signed   By: Genevie Ann M.D.   On: 02/20/2019 09:51    Lab Data:  CBC: Recent Labs  Lab 02/19/19 0712 02/19/19 0721 02/21/19 0539  WBC 5.9  --  14.8*  NEUTROABS 3.4  --   --   HGB 11.7* 12.9* 12.1*  HCT 37.0* 38.0* 37.6*  MCV 92.5  --  89.3  PLT 222  --  553   Basic Metabolic Panel: Recent Labs  Lab 02/19/19 0712 02/19/19 0721 02/20/19 0657 02/21/19 0539  NA 139 140 138 137  K 3.8 3.8 4.3 4.7  CL 106 106 106 107  CO2 24  --  22 22  GLUCOSE 174* 172* 152* 177*  BUN _0 CREATININE 1.84* 1.70* 1.57* 1.52*  CALCIUM 8.7*  --  8.6* 8.3*   GFR: Estimated Creatinine Clearance: 52.7 mL/min (A) (by C-G formula based on SCr of 1.52 mg/dL (H)). Liver Function Tests: Recent Labs  Lab 02/19/19 0712  AST 30  ALT 23  ALKPHOS 455*  BILITOT 0.9  PROT 6.5  ALBUMIN 3.4*   No results for input(s): LIPASE, AMYLASE in the last 168 hours. No results for input(s): AMMONIA in the last 168 hours. Coagulation Profile: Recent Labs  Lab 02/19/19 0712  INR 1.2   Cardiac Enzymes: No results for input(s): CKTOTAL, CKMB, CKMBINDEX, TROPONINI in the last 168 hours. BNP (last 3 results) No results for input(s): PROBNP in the last 8760 hours. HbA1C: Recent Labs    02/20/19 1238  HGBA1C 5.2   CBG: Recent Labs  Lab 02/19/19 1206  GLUCAP 76   Lipid Profile: No results for input(s): CHOL, HDL, LDLCALC, TRIG, CHOLHDL, LDLDIRECT in the last 72 hours. Thyroid Function Tests: No results for input(s): TSH, T4TOTAL, FREET4, T3FREE, THYROIDAB in the last 72 hours. Anemia Panel: Recent Labs    02/20/19 1238  VITAMINB12 595  FOLATE 12.2  FERRITIN 590*  TIBC 248*  IRON 60  RETICCTPCT 1.3   Urine analysis: No results found for: COLORURINE, APPEARANCEUR, LABSPEC, PHURINE, GLUCOSEU, HGBUR, BILIRUBINUR, KETONESUR, PROTEINUR, UROBILINOGEN, NITRITE,  LEUKOCYTESUR   Skyylar Kopf M.D. [facility] 02/21/2019, 12:39 PM  Pager: [phone] Between 7am to 7pm - call Pager - [phone]  After 7pm go to [email or web address] - password [email or web address]  Call night coverage person covering after 7pm

## 2019-02-21 NOTE — TOC Initial Note (Signed)
Transition of Care [facility]) - Initial/Assessment Note    Patient Details  Name: Chris Morales MRN: [id number] Date of Birth: Apr 18, 1956  [facility] CM/SW Contact:    Pollie Friar, RN Phone Number: 02/21/2019, 2:47 PM  Clinical Narrative:                 Pt's primary residence is in [place]. He is here visiting family and property he has in [place]. He plans on returning to [place] after a short stay in [place]. Pt states he has family here that can check in on him while he is here. Pt prefers to do any rehab in [place] and asks that he just get the orders at d/c and he will arrange once back in [place]. Pt without insurance and no PCP. He states once back in [place] he will arrange to get insurance set up and find a PCP.  TOC following for d/c needs.   Expected Discharge Plan: OP Rehab Barriers to Discharge: Continued Medical Work up, Inadequate or no insurance   Patient Goals and CMS Choice     Choice offered to / list presented to : Patient  Expected Discharge Plan and Services Expected Discharge Plan: OP Rehab   Discharge Planning Services: CM Consult                                          Prior Living Arrangements/Services   Lives with:: Self Patient language and need for interpreter reviewed:: Yes(no needs) Do you feel safe going back to the place where you live?: Yes        Care giver support system in place?: No (comment)   Criminal Activity/Legal Involvement Pertinent to Current Situation/Hospitalization: No - Comment as needed  Activities of Daily Living Home Assistive Devices/Equipment: Contact lenses ADL Screening (condition at time of admission) Patient's cognitive ability adequate to safely complete daily activities?: Yes Is the patient deaf or have difficulty hearing?: No Does the patient have difficulty seeing, even when wearing glasses/contacts?: No Does the patient have difficulty concentrating, remembering, or making decisions?: No Patient able to  express need for assistance with ADLs?: Yes Does the patient have difficulty dressing or bathing?: No Independently performs ADLs?: Yes (appropriate for developmental age) Communication: Independent Dressing (OT): Independent Grooming: Independent Feeding: Independent Bathing: Independent Toileting: Independent In/Out Bed: Independent Walks in Home: Independent Does the patient have difficulty walking or climbing stairs?: No Weakness of Legs: None Weakness of Arms/Hands: None  Permission Sought/Granted                  Emotional Assessment Appearance:: Appears stated age Attitude/Demeanor/Rapport: Engaged Affect (typically observed): Accepting, Pleasant Orientation: : Oriented to Self, Oriented to Place, Oriented to  Time, Oriented to Situation   Psych Involvement: No (comment)  Admission diagnosis:  Ataxia [R27.0] Cervical myelopathy (HCC) [G95.9] 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 AKI (acute kidney injury) ([place]) 02/20/2019  . Anemia 02/20/2019  . Prostate CA ([place]) 02/20/2019  . Cervical myelopathy ([place]) 02/19/2019   PCP:  Patient, No Pcp Per Pharmacy:   [name] [place] [place] [address] [place] [address] Phone: [phone] Fax: [phone]     Social Determinants of Health ([place]) Interventions    Readmission Risk Interventions No flowsheet data found.

## 2019-02-21 NOTE — Progress Notes (Signed)
Radiation oncology was consulted regarding this patient's case and the potential for postoperative radiation in the management of his newly diagnosed, diffusely metastatic prostate cancer, presenting on 02/19/19 with severe neck pain and upper extremity weakness secondary to cord compression at C5.  Dr. Tammi Klippel and I have reviewed the patient's case and imaging and plan to present this at the multidisciplinary brain and spine conference on 02/22/2019 to formalize treatment recommendations.  At present, we would anticipate offering a 10-day course of conventional radiotherapy to the cervical spine at the levels of his recent decompression/laminectomies from C4-C6.  He is 1 day postop currently, therefore we would not anticipate beginning any radiation until at least 2 weeks postoperatively to allow for surgical healing.  I reached out to the patient today to inform him of our plans as above and he shared with me that he plans to return to [place], where he lives, at discharge, hopefully by Friday, 02/24/2019 and that he would prefer to have any radiation treatment performed in [place]. He was started on [place] for ADT today at the recommendation of Dr. Jeffie Pollock and plans to follow-up with a urologist at [place] for further disease management and requested recommendation for radiation oncologist in that area as well.  I shared that we will discuss this further in conference tomorrow and I will be back in touch with him following our conference to share treatment options/recommendations.  He was quite grateful for the phone call and is comfortable and in agreement with the stated plan.  A more formal consult note will follow tomorrow.  Nicholos Johns, MMS, PA-C [facility] [place]: [phone]  Fax: [phone]

## 2019-02-21 NOTE — Progress Notes (Signed)
Orthopedic Tech Progress Note Patient Details:  Chris Morales [birth year] [id number]  Ortho Devices Type of Ortho Device: Soft collar Ortho Device/Splint Interventions: Application   Post Interventions Patient Tolerated: Well   [place] Pink 02/21/2019, 2:14 PM

## 2019-02-21 NOTE — Evaluation (Signed)
Physical Therapy Evaluation Patient Details Name: Chris Morales MRN: [id number] DOB: July 26, 1956 Today's Date: 02/21/2019   History of Present Illness  Patient was having progressive weakness over the past few weeks running into his arms and also effecting his gait. 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 He had a c3,4,5 laminectomy and tumor remaoval on 02/20/2019. He reports imprvoed use of hands especially the left hand. PMH No PMH on file. patient is from [place]  Patient reports improved use of his hands but continues to have some weakness and functional weakness of his left leg. He required assist for balance while ambulating. He improved the more he walked. He may benefit from use of a single point cane. If his balance and weakness persists he may benefit from outpatient rehab. Acute therapy will work with  Him with a cane tomorrow if he stays in the hospital.     Follow Up Recommendations Outpatient PT(if balance issues persist )    Equipment Recommendations  Cane    Recommendations for Other Services       Precautions / Restrictions Precautions Precautions: Cervical Precaution Booklet Issued: Yes (comment) Precaution Comments: verbally discussed precautions Required Braces or Orthoses: Cervical Brace Restrictions Weight Bearing Restrictions: No      Mobility  Bed Mobility Overal bed mobility: Independent             General bed mobility comments: found sitting at the edge of the bed. He reports he sat up without difficulty. Talked to patient about log role if needed.   Transfers Overall transfer level: Needs assistance Equipment used: None Transfers: Sit to/from Stand Sit to Stand: Min guard;Min assist         General transfer comment: min a at times for standing balance. Left leg weakness noted   Ambulation/Gait Ambulation/Gait assistance: Min guard;Min assist Gait Distance (Feet): 100 Feet Assistive device: IV Pole Gait  Pattern/deviations: Decreased stance time - left;Step-through pattern Gait velocity: decreased   General Gait Details: decreased stability on the left side. Frewently needed assistance for balance on the left.   Stairs            Wheelchair Mobility    Modified Rankin (Stroke Patients Only)       Balance Overall balance assessment: Needs assistance Sitting-balance support: No upper extremity supported;Feet supported Sitting balance-Leahy Scale: Good     Standing balance support: No upper extremity supported Standing balance-Leahy Scale: Poor Standing balance comment: Patien required assist to correct balance on 2 occasions                              Pertinent Vitals/Pain Pain Assessment: Faces Faces Pain Scale: Hurts a little bit Pain Location: neck  Pain Descriptors / Indicators: Aching;Grimacing Pain Intervention(s): Limited activity within patient's tolerance;Monitored during session    Home Living Family/patient expects to be discharged to:: Private residence Living Arrangements: Alone Available Help at Discharge: Friend(s);Available PRN/intermittently Type of Home: House       Home Layout: One level Home Equipment: None      Prior Function Level of Independence: Independent         Comments: works a a Engineer, production   Dominant Hand: Right    Extremity/Trunk Assessment   Upper Extremity Assessment Upper Extremity Assessment: Defer to OT evaluation    Lower Extremity Assessment Lower Extremity Assessment: LLE deficits/detail LLE Deficits /  Details: left flexion 3+/5 left abduction4/5     Cervical / Trunk Assessment Cervical / Trunk Assessment: Other exceptions(wearing C-collar )  Communication   Communication: No difficulties  Cognition Arousal/Alertness: Awake/alert Behavior During Therapy: WFL for tasks assessed/performed Overall Cognitive Status: Within Functional Limits for tasks assessed                                         General Comments      Exercises     Assessment/Plan    PT Assessment Patient needs continued PT services  PT Problem List Decreased strength;Decreased balance;Decreased mobility;Decreased coordination;Decreased knowledge of use of DME       PT Treatment Interventions DME instruction;Gait training;Functional mobility training;Therapeutic activities;Balance training;Therapeutic exercise;Neuromuscular re-education    PT Goals (Current goals can be found in the Care Plan section)  Acute Rehab PT Goals Patient Stated Goal: to go home PT Goal Formulation: With patient Time For Goal Achievement: 03/07/19 Potential to Achieve Goals: Good    Frequency Min 3X/week   Barriers to discharge        Co-evaluation               AM-PAC PT "6 Clicks" Mobility  Outcome Measure Help needed turning from your back to your side while in a flat bed without using bedrails?: None Help needed moving from lying on your back to sitting on the side of a flat bed without using bedrails?: None Help needed moving to and from a bed to a chair (including a wheelchair)?: A Little Help needed standing up from a chair using your arms (e.g., wheelchair or bedside chair)?: A Little Help needed to walk in hospital room?: A Little Help needed climbing 3-5 steps with a railing? : A Lot 6 Click Score: 19    End of Session Equipment Utilized During Treatment: Gait belt;Cervical collar Activity Tolerance: Patient tolerated treatment well Patient left: in chair;with call bell/phone within reach Nurse Communication: Mobility status PT Visit Diagnosis: Unsteadiness on feet (R26.81);Other abnormalities of gait and mobility (R26.89);Pain Pain - part of body: (cervical spine )    Time: 0174-9449 PT Time Calculation (min) (ACUTE ONLY): 37 min   Charges:   PT Evaluation $PT Eval Moderate Complexity: 1 Mod          Carney Living  PT DPT  02/21/2019, 1:00  PM

## 2019-02-21 NOTE — Evaluation (Signed)
Occupational Therapy Evaluation Patient Details Name: Chris Morales MRN: [id number] DOB: 1955/09/25 Today's Date: 02/21/2019    History of Present Illness Patient was having progressive weakness over the past few weeks running into his arms and also effecting his gait. 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 He had a c3,4,5 laminectomy and tumor remaoval on 02/20/2019. He reports imprvoed use of hands especially the left hand. PMH No PMH on file. patient is from [place] PTA: living at home alone; home is [place], but pt drove down to [place] to see rental properties. Pt currently performing ADL functional mobility with no AD. Pt performing ADL with modified independence and figure four technique for LB ADL tasks. Pt performing ADL at sink with 2 cups to avoid bending. Pt education provided for cervical precautions and pt agreeable. Pt able to tolerate mobility and ADL with no focal deficits. OT signing off.       Follow Up Recommendations  No OT follow up    Equipment Recommendations  None recommended by OT    Recommendations for Other Services       Precautions / Restrictions Precautions Precautions: Cervical Precaution Booklet Issued: Yes (comment) Precaution Comments: verbally discussed precautions Required Braces or Orthoses: Cervical Brace Restrictions Weight Bearing Restrictions: No      Mobility Bed Mobility Overal bed mobility: Independent                Transfers Overall transfer level: Needs assistance Equipment used: None Transfers: Sit to/from Stand Sit to Stand: Supervision         General transfer comment: superivsionA for safety    Balance Overall balance assessment: Needs assistance Sitting-balance support: No upper extremity supported;Feet supported Sitting balance-Leahy Scale: Good     Standing balance support: No upper extremity supported Standing balance-Leahy Scale: Fair Standing balance comment: no  physical assist for standing balance                           ADL either performed or assessed with clinical judgement   ADL Overall ADL's : At baseline                                       General ADL Comments: pt able to perform UB/LB ADL with independence and figure 4 technique with no difficulty. Pt donning/doffing soft collar with independence.     Vision Baseline Vision/History: No visual deficits Vision Assessment?: No apparent visual deficits     Perception     Praxis      Pertinent Vitals/Pain Pain Assessment: No/denies pain Pain Intervention(s): Monitored during session     Hand Dominance Right   Extremity/Trunk Assessment Upper Extremity Assessment Upper Extremity Assessment: Overall WFL for tasks assessed   Lower Extremity Assessment Lower Extremity Assessment: Overall WFL for tasks assessed   Cervical / Trunk Assessment Cervical / Trunk Assessment: Other exceptions Cervical / Trunk Exceptions: s/p Cervical sx   Communication Communication Communication: No difficulties   Cognition Arousal/Alertness: Awake/alert Behavior During Therapy: WFL for tasks assessed/performed Overall Cognitive Status: Within Functional Limits for tasks assessed                                     General Comments  Exercises     Shoulder Instructions      Home Living Family/patient expects to be discharged to:: Private residence Living Arrangements: Alone Available Help at Discharge: Friend(s);Available PRN/intermittently Type of Home: House       Home Layout: One level     Bathroom Shower/Tub: Occupational psychologist: Standard     Home Equipment: None          Prior Functioning/Environment Level of Independence: Independent                 OT Problem List:        OT Treatment/Interventions:      OT Goals(Current goals can be found in the care plan section) Acute Rehab OT  Goals Patient Stated Goal: to go home  OT Frequency:     Barriers to D/C:            Co-evaluation              AM-PAC OT "6 Clicks" Daily Activity     Outcome Measure Help from another person eating meals?: None Help from another person taking care of personal grooming?: None Help from another person toileting, which includes using toliet, bedpan, or urinal?: None Help from another person bathing (including washing, rinsing, drying)?: None Help from another person to put on and taking off regular upper body clothing?: None Help from another person to put on and taking off regular lower body clothing?: None 6 Click Score: 24   End of Session Equipment Utilized During Treatment: Cervical collar Nurse Communication: Mobility status  Activity Tolerance: Patient tolerated treatment well Patient left: in chair;with call bell/phone within reach  OT Visit Diagnosis: Unsteadiness on feet (R26.81);Muscle weakness (generalized) (M62.81)                Time: 8412-8208 OT Time Calculation (min): 24 min Charges:  OT General Charges $OT Visit: 1 Visit OT Evaluation $OT Eval Moderate Complexity: 1 Mod OT Treatments $Self Care/Home Management : 8-22 mins  Ebony Hail Harold Hedge) Marsa Aris OTR/L Acute Rehabilitation Services Pager: [phone] Office: [place] 02/21/2019, 4:15 PM

## 2019-02-22 ENCOUNTER — Ambulatory Visit
Admit: 2019-02-22 | Discharge: 2019-02-22 | Disposition: A | Discharge status: Home / Self Care | Payer: MEDICAID | Attending: Radiation Oncology | Admitting: Radiation Oncology

## 2019-02-22 DIAGNOSIS — C7951 Secondary malignant neoplasm of bone: Secondary | ICD-10-CM | POA: Insufficient documentation

## 2019-02-22 DIAGNOSIS — C61 Malignant neoplasm of prostate: Secondary | ICD-10-CM | POA: Insufficient documentation

## 2019-02-22 LAB — PROTEIN ELECTROPHORESIS, SERUM
A/G Ratio: 1.3 (ref 0.7–1.7)
Albumin ELP: 3.5 g/dL (ref 2.9–4.4)
Alpha-1-Globulin: 0.2 g/dL (ref 0.0–0.4)
Alpha-2-Globulin: 0.8 g/dL (ref 0.4–1.0)
Beta Globulin: 0.9 g/dL (ref 0.7–1.3)
Gamma Globulin: 0.9 g/dL (ref 0.4–1.8)
Globulin, Total: 2.8 g/dL (ref 2.2–3.9)
Total Protein ELP: 6.3 g/dL (ref 6.0–8.5)

## 2019-02-22 LAB — BASIC METABOLIC PANEL
Anion gap: 8 (ref 5–15)
BUN: 29 mg/dL — ABNORMAL HIGH (ref 8–23)
CO2: 25 mmol/L (ref 22–32)
Calcium: 8.5 mg/dL — ABNORMAL LOW (ref 8.9–10.3)
Chloride: 108 mmol/L (ref 98–111)
Creatinine, Ser: 1.54 mg/dL — ABNORMAL HIGH (ref 0.61–1.24)
GFR calc Af Amer: 55 mL/min — ABNORMAL LOW (ref >60)
GFR calc non Af Amer: 48 mL/min — ABNORMAL LOW (ref >60)
Glucose, Bld: 113 mg/dL — ABNORMAL HIGH (ref 70–99)
Potassium: 4.4 mmol/L (ref 3.5–5.1)
Sodium: 141 mmol/L (ref 135–145)

## 2019-02-22 LAB — CBC
HCT: 32.7 % — ABNORMAL LOW (ref 39.0–52.0)
Hemoglobin: 10.7 g/dL — ABNORMAL LOW (ref 13.0–17.0)
MCH: 28.9 pg (ref 26.0–34.0)
MCHC: 32.7 g/dL (ref 30.0–36.0)
MCV: 88.4 fL (ref 80.0–100.0)
Platelets: 207 10*3/uL (ref 150–400)
RBC: 3.7 MIL/uL — ABNORMAL LOW (ref 4.22–5.81)
RDW: 13.6 % (ref 11.5–15.5)
WBC: 20.4 10*3/uL — ABNORMAL HIGH (ref 4.0–10.5)
nRBC: 0 % (ref 0.0–0.2)

## 2019-02-22 MED ORDER — CHLORPROMAZINE HCL 25 MG/ML IJ SOLN
25.0000 mg | Freq: Once | INTRAMUSCULAR | Status: DC
  Filled 2019-02-22: qty 1

## 2019-02-22 NOTE — Progress Notes (Signed)
Physical Therapy Treatment Patient Details Name: Chris Morales MRN: [id number] DOB: 1955-09-16 Today's Date: 02/22/2019    History of Present Illness Patient was having progressive weakness over the past few weeks running into his arms and also effecting his gait. 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 He had a c3,4,5 laminectomy and tumor remaoval on 02/20/2019. He reports imprvoed use of hands especially the left hand. PMH No PMH on file. patient is from [place]    Patient seen for mobility progression. Pt continues to demonstrate impaired balance and will continue to benefit from further skilled PT services to maximize independence and safety with mobility.    Follow Up Recommendations  Outpatient PT     Equipment Recommendations  Other (comment)(SPC)    Recommendations for Other Services       Precautions / Restrictions Precautions Precautions: Cervical Precaution Comments: verbally discussed precautions Required Braces or Orthoses: Cervical Brace    Mobility  Bed Mobility               General bed mobility comments: pt OOB in chair upon arrival  Transfers Overall transfer level: Needs assistance Equipment used: None Transfers: Sit to/from Stand Sit to Stand: Min guard         General transfer comment: min guard for safety  Ambulation/Gait Ambulation/Gait assistance: Min guard Gait Distance (Feet): 200 Feet   Gait Pattern/deviations: Step-through pattern;Drifts right/left Gait velocity: decreased   General Gait Details: pt denies need for UE support beginning of session; pt with unsteady gait and requires min guard for safety   Stairs Stairs: Yes Stairs assistance: Min assist;Min guard Stair Management: No rails;One rail Right Number of Stairs: (2 steps X 2 trials) General stair comments: cues for safety; min guard to ascend and min A to descend with use of single UE support    Wheelchair Mobility    Modified Rankin  (Stroke Patients Only)       Balance Overall balance assessment: Needs assistance Sitting-balance support: No upper extremity supported;Feet supported Sitting balance-Leahy Scale: Good     Standing balance support: No upper extremity supported Standing balance-Leahy Scale: Fair                              Cognition Arousal/Alertness: Awake/alert Behavior During Therapy: WFL for tasks assessed/performed Overall Cognitive Status: Impaired/Different from baseline Area of Impairment: Safety/judgement                         Safety/Judgement: Decreased awareness of deficits     General Comments: pt with decreased awareness of deficits beginning of session however realizing limitations after mobilizing       Exercises      General Comments        Pertinent Vitals/Pain Pain Assessment: No/denies pain Pain Intervention(s): Monitored during session    Home Living                      Prior Function            PT Goals (current goals can now be found in the care plan section) Acute Rehab PT Goals Patient Stated Goal: to go home Progress towards PT goals: Progressing toward goals    Frequency    Min 3X/week      PT Plan Current plan remains appropriate    Co-evaluation  AM-PAC PT "6 Clicks" Mobility   Outcome Measure  Help needed turning from your back to your side while in a flat bed without using bedrails?: None Help needed moving from lying on your back to sitting on the side of a flat bed without using bedrails?: None Help needed moving to and from a bed to a chair (including a wheelchair)?: None Help needed standing up from a chair using your arms (e.g., wheelchair or bedside chair)?: A Little Help needed to walk in hospital room?: A Little Help needed climbing 3-5 steps with a railing? : A Little 6 Click Score: 21    End of Session Equipment Utilized During Treatment: Gait belt;Cervical  collar Activity Tolerance: Patient tolerated treatment well Patient left: in chair;with call bell/phone within reach Nurse Communication: Mobility status PT Visit Diagnosis: Unsteadiness on feet (R26.81);Other abnormalities of gait and mobility (R26.89);Pain Pain - part of body: (cervical spine )     Time: 1638-4665 PT Time Calculation (min) (ACUTE ONLY): 12 min  Charges:  $Gait Training: 8-22 mins                     Earney Navy, PTA Acute Rehabilitation Services Pager: [phone] Office: [phone]     Darliss Cheney 02/22/2019, 9:38 AM

## 2019-02-22 NOTE — Consult Note (Signed)
Radiation Oncology         [phone] ________________________________  Initial inpatient Consultation- Conducted via telephone due to current COVID-19 concerns for limiting patient exposure  Name: Chris Morales MRN: [id number]  Date of Service: 02/19/2019 DOB: [birth year]  HO:[id number], No Pcp Per  No ref. provider found   REFERRING PHYSICIAN: Dr. Earnie Larsson  DIAGNOSIS: 63 y.o. 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    ICD-10-CM   1. Ataxia  R27.0   2. Neurologic gait disorder  R26.9   3. Cervical myelopathy (HCC)  G95.9 DG Chest 2 View    DG Chest 2 View  4. Hydronephrosis  N13.30 US RENAL    US RENAL  5. Surgery, elective  Z41.9 DG Cervical Spine 1 View    DG Cervical Spine 1 View    CANCELED: DG Cervical Spine 2-3 Views    CANCELED: DG Cervical Spine 2-3 Views    HISTORY OF PRESENT ILLNESS: Chris Morales is a 63 y.o. male seen at the request of Dr. Annette Stable.  The patient is originally from [place] and has been in [place] more recently visiting.  He reports developing what he thought was a crick in his neck with some discomfort and stiffness approximately 1 week ago.  Approximately 3 days prior to his admission he began experiencing electrical-like sensations intermittently in the upper extremities, L>R.  This progressed into weakness and loss of grip strength in the left hand associated with some discoordination which prompted emergent evaluation through the ER.  He denied any difficulty with speech, headaches, history of stroke or spine dysfunction or prior malignancies.  CT imaging of the cervical spine on admission showed multiple levels of bone signal abnormality with concern for cord anomaly and stenosis.  A PSA on admission was noted to be severely elevated at 270. He was started on IV Decadron and neurosurgery was consulted for evaluation. He was seen by Dr. Earnie Larsson who recommended further evaluation with MRI imaging of the spine.  MRI of the C, T and L-spine were completed on 02/20/2019 which revealed widespread osseous metastatic disease C4-C7 with enhancing epidural tumor maximal at the C5 vertebral segment dorsally, eccentric to the left.  Significant spinal stenosis and cord compression was noted and findings felt most consistent with epidural spread of metastatic prostate cancer.  Additionally, there was diffuse metastatic disease seen throughout all visualized thoracic and lumbar vertebrae, but without epidural tumor involvement or pathologic compression fractures.  Given his continued bilateral upper extremity sensory loss and weakness with MRI evidence of epidural tumor involvement resulting in cord compression at the level of C5, the patient was taken for emergent C4, C5, C6 laminectomy and surgical decompression of epidural tumor with Dr. Trenton Gammon on 02/20/2019.  His surgery went well and he has had significant improvement in the upper extremity weakness and sensation.  Final pathology confirmed metastatic adenocarcinoma consistent with primary prostate malignancy.  He has continued to struggle with acute urinary retention, secondary to his prostate malignancy, and was consulted with Dr. Jeffie Pollock from urology who recommended placement of indwelling Foley catheter as well as initiation of ADT for management of his newly diagnosed metastatic prostate cancer.    Radiation oncology was consulted regarding this patient's case and the potential for postoperative radiation in the management of his disease.  PREVIOUS RADIATION THERAPY: No  PAST MEDICAL HISTORY:  Past Medical History:  Diagnosis Date   Prostate  [facility])       PAST SURGICAL HISTORY: Past Surgical History:  Procedure Laterality Date   LAMINECTOMY Bilateral 02/20/2019   Procedure: CERVICAL LAMINECTOMY FOR TUMOR;  Surgeon: Earnie Larsson, MD;  Location: [place];  Service: Neurosurgery;  Laterality:  Bilateral;  CERVICAL LAMINECTOMY FOR TUMOR   LIGAMENT REPAIR Left     FAMILY HISTORY: History reviewed. No pertinent family history.  SOCIAL HISTORY:  Social History   Socioeconomic History   Marital status: Divorced    Spouse name: Not on file   Number of children: Not on file   Years of education: Not on file   Highest education level: Not on file  Occupational History   Not on file  Social Needs   Financial resource strain: Not on file   Food insecurity    Worry: Not on file    Inability: Not on file   Transportation needs    Medical: Not on file    Non-medical: Not on file  Tobacco Use   Smoking status: Never Smoker   Smokeless tobacco: Never Used  Substance and Sexual Activity   Alcohol use: Yes   Drug use: No   Sexual activity: Not Currently  Lifestyle   Physical activity    Days per week: Not on file    Minutes per session: Not on file   Stress: Not on file  Relationships   Social connections    Talks on phone: Not on file    Gets together: Not on file    Attends religious service: Not on file    Active member of club or organization: Not on file    Attends meetings of clubs or organizations: Not on file    Relationship status: Not on file   Intimate partner violence    Fear of current or ex partner: Not on file    Emotionally abused: Not on file    Physically abused: Not on file    Forced sexual activity: Not on file  Other Topics Concern   Not on file  Social History Narrative   Not on file    ALLERGIES: Patient has no known allergies.  MEDICATIONS:  Current Facility-Administered Medications  Medication Dose Route Frequency Provider Last Rate Last Dose   0.9 %  sodium chloride infusion   Intravenous Continuous Earnie Larsson, MD 75 mL/hr at 02/22/19 0714     0.9 %  sodium chloride infusion  250 mL Intravenous Continuous Earnie Larsson, MD       acetaminophen (TYLENOL) tablet 650 mg  650 mg Oral Q6H PRN Earnie Larsson, MD       Or     acetaminophen (TYLENOL) suppository 650 mg  650 mg Rectal Q6H PRN Earnie Larsson, MD       bisacodyl (DULCOLAX) suppository 10 mg  10 mg Rectal Daily PRN Earnie Larsson, MD       diazepam (VALIUM) tablet 5-10 mg  5-10 mg Oral Q6H PRN Earnie Larsson, MD       hydrALAZINE (APRESOLINE) injection 10 mg  10 mg Intravenous Q6H PRN Earnie Larsson, MD   10 mg at 02/20/19 1637   HYDROcodone-acetaminophen (NORCO/VICODIN) 5-325 MG per tablet 1-2 tablet  1-2 tablet Oral Q4H PRN Earnie Larsson, MD   2 tablet at 02/20/19 1654   HYDROmorphone (DILAUDID) injection 1 mg  1 mg Intravenous Q2H PRN Earnie Larsson, MD       lactated ringers infusion   Intravenous Continuous Earnie Larsson, MD       menthol-cetylpyridinium (  CEPACOL) lozenge 3 mg  1 lozenge Oral PRN Earnie Larsson, MD       Or   phenol (CHLORASEPTIC) mouth spray 1 spray  1 spray Mouth/Throat PRN Earnie Larsson, MD       methylPREDNISolone (MEDROL DOSEPAK) tablet 4 mg  4 mg Oral 3 x daily with food Earnie Larsson, MD   4 mg at 02/22/19 0841   [START ON 02/23/2019] methylPREDNISolone (MEDROL DOSEPAK) tablet 4 mg  4 mg Oral 4X daily taper Earnie Larsson, MD       methylPREDNISolone (MEDROL DOSEPAK) tablet 8 mg  8 mg Oral Nightly Pool, Mallie Mussel, MD       mupirocin ointment (BACTROBAN) 2 % 1 application  1 application Nasal BID Rai, Ripudeep K, MD   1 application at 80/16/55 0842   ondansetron (ZOFRAN) tablet 4 mg  4 mg Oral Q6H PRN Earnie Larsson, MD       Or   ondansetron [facility]) injection 4 mg  4 mg Intravenous Q6H PRN Earnie Larsson, MD       oxyCODONE (Oxy IR/ROXICODONE) immediate release tablet 10 mg  10 mg Oral Q3H PRN Earnie Larsson, MD       polyethylene glycol (MIRALAX / GLYCOLAX) packet 17 g  17 g Oral Daily PRN Earnie Larsson, MD       sodium chloride flush (NS) 0.9 % injection 3 mL  3 mL Intravenous Q12H Earnie Larsson, MD   3 mL at 02/22/19 0843   sodium chloride flush (NS) 0.9 % injection 3 mL  3 mL Intravenous PRN Earnie Larsson, MD       sodium phosphate (FLEET) 7-19  GM/118ML enema 1 enema  1 enema Rectal Once PRN Earnie Larsson, MD       tamsulosin [facility]) capsule 0.4 mg  0.4 mg Oral Daily Earnie Larsson, MD   0.4 mg at 02/22/19 3748    REVIEW OF SYSTEMS:  On review of systems, the patient reports that he is doing well overall.  He has had significant improvement in the upper extremity weakness and sensation postoperatively and currently on a Medrol Dosepak for steroid taper.Marland Kitchen He denies any chest pain, shortness of breath, cough, fevers, chills, night sweats, unintended weight changes.  He reports that he has lost down to his goal weight over the past year which was intentional.  He denies any bowel disturbances but has required indwelling Foley catheter secondary to acute urinary retention.  He currently denies abdominal pain, nausea or vomiting.  He denies any new musculoskeletal or joint aches or pains. A complete review of systems is obtained and is otherwise negative.  PHYSICAL EXAM:  Wt Readings from Last 3 Encounters:  02/19/19 163 lb (73.9 kg)   Temp Readings from Last 3 Encounters:  02/22/19 98.8 F (37.1 C) (Oral)  02/19/14 98.6 F (37 C) (Oral)   BP Readings from Last 3 Encounters:  02/22/19 (!) 142/93  02/19/14 130/87   Pulse Readings from Last 3 Encounters:  02/22/19 68  02/19/14 72   Pain Assessment Pain Score: 0-No pain/10  Unable to assess due to telephone visit format.  KPS = 90  100 - Normal; no complaints; no evidence of disease. 90   - Able to carry on normal activity; minor signs or symptoms of disease. 80   - Normal activity with effort; some signs or symptoms of disease. 32   - Cares for self; unable to carry on normal activity or to do active work. 60   - Requires occasional assistance, but  is able to care for most of his personal needs. 50   - Requires considerable assistance and frequent medical care. 37   - Disabled; requires special care and assistance. 62   - Severely disabled; hospital admission is indicated  although death not imminent. 62   - Very sick; hospital admission necessary; active supportive treatment necessary. 10   - Moribund; fatal processes progressing rapidly. 0     - Dead  Karnofsky DA, Abelmann [place], Craver LS and Burchenal [facility] [phone]) The use of the nitrogen mustards in the palliative treatment of carcinoma: with particular reference to bronchogenic carcinoma Cancer 1 634-56  LABORATORY DATA:  Lab Results  Component Value Date   WBC 20.4 (H) 02/22/2019   HGB 10.7 (L) 02/22/2019   HCT 32.7 (L) 02/22/2019   MCV 88.4 02/22/2019   PLT 207 02/22/2019   Lab Results  Component Value Date   NA 141 02/22/2019   K 4.4 02/22/2019   CL 108 02/22/2019   CO2 25 02/22/2019   Lab Results  Component Value Date   ALT 23 02/19/2019   AST 30 02/19/2019   ALKPHOS 455 (H) 02/19/2019   BILITOT 0.9 02/19/2019     RADIOGRAPHY: Ct Abdomen Pelvis Wo Contrast  Result Date: 02/19/2019 CLINICAL DATA:  Sclerotic lesions in cervical spine. Evaluate for multiple myeloma or solid organ malignancy. EXAM: CT ABDOMEN AND PELVIS WITHOUT CONTRAST TECHNIQUE: Multidetector CT imaging of the abdomen and pelvis was performed following the standard protocol without IV contrast. COMPARISON:  None. FINDINGS: Lower chest: 7 mm subpleural pulmonary nodule left lower lobe, image 6 series 4. No acute airspace disease. No pleural fluid. No focal airspace disease. No pleural fluid. Hepatobiliary: Evaluation for focal lesion is limited in the absence of IV contrast. Allowing for this, no focal lesion is visualized. Multiple calcified granuloma. Gallbladder physiologically distended, no calcified stone. No biliary dilatation. Pancreas: Evaluation is limited by absence of IV contrast and paucity of intra-abdominal fat. No evidence of focal pancreatic abnormality. No ductal dilatation or inflammation. Spleen: Spleen is normal to small in size spanning 8.1 cm. Multiple calcified granuloma. Adrenals/Urinary Tract: No evidence of  adrenal nodule, adrenal glands poorly visualized. Bilateral hydroureteronephrosis. Ureters are dilated to the bladder insertion. No obvious renal mass on noncontrast exam. The urinary bladder is distended measuring 11.2 x 9.8 x 15.1 cm (volume = 870 cm^3). Mild bladder wall thickening. No obvious bladder mass on noncontrast exam. Stomach/Bowel: Administered enteric contrast reaches the mid distal small bowel. No obstruction. Stomach physiologically distended without obvious gastric mass. No bowel wall thickening or inflammatory change. Normal appendix. Moderate stool burden throughout the colon. No obvious colonic mass. No colonic wall thickening. Vascular/Lymphatic: Suspected retroperitoneal adenopathy extending from the level of the left renal vein to the iliac bifurcation. Largest node in the left periaortic station measuring 19 mm short axis, image 28 series 3. Lack of IV contrast and paucity of intra-abdominal fat limits assessment. There is left internal and external iliac adenopathy, largest node in the left internal iliac chain measuring 2.3 cm, image 64. Right external iliac/pelvic sidewall adenopathy at 2.2 cm, slightly increased nodal density. Small bilateral inguinal nodes. Reproductive: Prostate gland spans 4.4 x 4.9 cm. Soft tissue prominence of the right seminal vesicle. Other: Minimal fat in the left inguinal canal. No ascites or free air. Musculoskeletal: Sclerotic lesions throughout the included thoracic and lumbar spine. Lesions in every lumbar vertebral body, largest involving L5. Sclerotic lesion and right posterior tenth rib. Multiple sclerotic lesions in the pelvis.  IMPRESSION: 1. Findings suspicious for primary prostate malignancy. Bilateral pelvic and retroperitoneal adenopathy. Multifocal sclerotic osseous lesions. Prominent prostate gland with distended urinary bladder and bilateral hydronephrosis. 2. A 7 mm left lower lobe pulmonary nodule is nonspecific, but metastatic disease is  considered. 3. Sequela of prior granulomatous disease with splenic and hepatic calcifications. Electronically Signed   By: Keith Rake M.D.   On: 02/19/2019 22:53   Dg Chest 2 View  Result Date: 02/19/2019 CLINICAL DATA:  Chest pain. EXAM: CHEST - 2 VIEW COMPARISON:  None. FINDINGS: The cardiac silhouette, mediastinal and hilar contours are within normal limits. The lungs are clear of acute process. No infiltrates, edema or effusions. Rounded density in the left lower chest or could reflect a nipple shadow. A follow-up PA chest film with nipple markers may be helpful to exclude a pulmonary nodule. The bony thorax is intact. IMPRESSION: 1. No acute cardiopulmonary findings. 2. Nodular density in the left lower lobe could reflect a nipple shadow or overlapping bony and vascular markings. A repeat PA chest film with nipple markers may be helpful when able, for further evaluation. Electronically Signed   By: Marijo Sanes M.D.   On: 02/19/2019 14:45   Dg Cervical Spine 1 View  Result Date: 02/20/2019 CLINICAL DATA:  Epidural tumor. EXAM: DG CERVICAL SPINE - 1 VIEW COMPARISON:  Cervical MRI dated 02/20/2019 FINDINGS: Lateral view of the cervical spine demonstrates an instrument at the C4-5 level posteriorly. This is the level of the previously demonstrated epidural tumor. Note is made of abnormal sclerosis and patchy lucency in the vertebra at C4, C5, C6, and C7 consistent with the tumor demonstrated on the prior exam. IMPRESSION: Localization instrument at C4-5. Electronically Signed   By: Lorriane Shire M.D.   On: 02/20/2019 16:51   Ct Head Wo Contrast  Result Date: 02/19/2019 CLINICAL DATA:  63 year old and possible stroke. EXAM: CT HEAD WITHOUT CONTRAST TECHNIQUE: Contiguous axial images were obtained from the base of the skull through the vertex without intravenous contrast. COMPARISON:  06/18/2014 FINDINGS: Brain: No evidence of acute infarction, hemorrhage, hydrocephalus, extra-axial collection or  mass lesion/mass effect. Vascular: No hyperdense vessel or unexpected calcification. Skull: Normal. Negative for fracture or focal lesion. Sinuses/Orbits: Mucosal disease in the right maxillary sinus which is partially imaged. Other: None IMPRESSION: No acute intracranial abnormality. Right maxillary sinus disease. Electronically Signed   By: Markus Daft M.D.   On: 02/19/2019 08:24   Ct Cervical Spine Wo Contrast  Result Date: 02/19/2019 CLINICAL DATA:  C-spine trauma. Myelopathy.  Abnormal MRI. EXAM: CT CERVICAL SPINE WITHOUT CONTRAST TECHNIQUE: Multidetector CT imaging of the cervical spine was performed without intravenous contrast. Multiplanar CT image reconstructions were also generated. COMPARISON:  MRI cervical spine 02/19/2019 FINDINGS: Alignment: Reversal of the normal cervical lordotic curve. Skull base and vertebrae: Abnormal sclerosis of the cervical vertebrae, C4 through C7, and the visualized thoracic vertebrae, T1 and T2, also involving the facets and other posterior elements, slight remodeling with loss of height particularly at C5 and C6. Concern for metastatic disease. Soft tissues and spinal canal: The spinal canal is poorly visualized on this exam. However, correlating with MRI, and given the appearance of the vertebral bodies, posterior epidural tumor at C5 is a differential concern. Disc levels: Near complete loss of interspace height from C4 through C7-T1. This results in varying degrees of foraminal narrowing. Upper chest: Negative. Other: None. IMPRESSION: 1. Abnormal sclerosis of the cervical vertebrae, and visualized upper thoracic vertebrae, with slight remodeling and loss of  height particularly at C5 and C6. Concern for metastatic disease, specifically prostate cancer in a male. Less common considerations would include transitional cell carcinoma, mucinous adenocarcinomas, or lymphoma. 2. The spinal canal is poorly visualized on this exam, but given the appearance of the vertebral  bodies, posterior epidural tumor at C5 is a differential consideration, along with hemorrhage or infection. 3. Consider MRI with contrast for further evaluation, to evaluate the posterior epidural space. As the noncontrast study was considerably degraded, consider premedication with pain medication or anxiolytics. Electronically Signed   By: Staci Righter M.D.   On: 02/19/2019 15:25   Mr Brain Wo Contrast (neuro Protocol)  Result Date: 02/19/2019 CLINICAL DATA:  Focal neuro deficit greater than 6 hours. Suspect stroke. EXAM: MRI HEAD WITHOUT CONTRAST TECHNIQUE: Multiplanar, multiecho pulse sequences of the brain and surrounding structures were obtained without intravenous contrast. COMPARISON:  CT head 02/19/2019 FINDINGS: Brain: Image quality degraded by motion. Negative for acute or chronic infarction. Ventricle size normal. Negative for mass or edema. Negative for hemorrhage or fluid collection. Vascular: Normal arterial flow voids Skull and upper cervical spine: Normal skull. Abnormal bone marrow throughout the C4 vertebral body. C2 and C3 vertebral bodies normal. Sinuses/Orbits: Mucosal edema paranasal sinuses.  Normal orbit Other: None IMPRESSION: Image quality degraded by motion Negative MRI of the brain without contrast Diffusely abnormal bone marrow at C4.  Possible tumor involvement. Electronically Signed   By: Franchot Gallo M.D.   On: 02/19/2019 11:06   Mr Cervical Spine Wo Contrast  Addendum Date: 02/19/2019   ADDENDUM REPORT: 02/19/2019 13:07 ADDENDUM: After further review and discussion with Dr. Trenton Gammon, there appears to be a posterior epidural process at the C5 bilaterally. This appears to be contributing to spinal stenosis. This has increased signal on T1 and decreased signal intensity on T2. Possible hemorrhage or abscess. MRI cervical spine without with contrast preferably with sedation would be helpful for further evaluation. Also consider CT cervical spine. Electronically Signed   By:  Franchot Gallo M.D.   On: 02/19/2019 13:07   Result Date: 02/19/2019 CLINICAL DATA:  Neck pain. Radiculopathy with arm pain and weakness. EXAM: MRI CERVICAL SPINE WITHOUT CONTRAST TECHNIQUE: Multiplanar, multisequence MR imaging of the cervical spine was performed. No intravenous contrast was administered. COMPARISON:  None. FINDINGS: Alignment: Image quality degraded by significant motion. Normal alignment.  Cervical kyphosis. Vertebrae: Negative for fracture. Diffuse bone marrow abnormality in the cervical spine. The vertebral bodies show low signal on T1 and T2 from C4 through T2 with similar changes in the posterior elements. There is also similar change in the posterior elements but not vertebral body of C3. Cord: Cord evaluation limited by motion however there is possible cord edema throughout the cervical spine from approximately C4 through C6. Posterior Fossa, vertebral arteries, paraspinal tissues: Negative Disc levels: C2-3: Negative C3-4: Mild spinal stenosis. Disc degeneration and diffuse uncinate spurring. Moderate foraminal narrowing bilaterally C4-5: Moderate spinal stenosis. Disc degeneration and spurring. Moderate foraminal stenosis bilaterally C5-6: Disc degeneration and spondylosis. Moderate left foraminal stenosis and mild-to-moderate spinal stenosis C6-7: Disc degeneration and spondylosis. Mild spinal stenosis and mild foraminal stenosis bilaterally C7-T1: Moderate disc degeneration and spurring. Moderate foraminal stenosis bilaterally. Mild spinal stenosis. IMPRESSION: 1. Image quality degraded by significant motion. 2. 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 3. Cord evaluation limited due to motion however there is suggestion of cord edema throughout much of the cervical spine from C4 through C6. This  could be due to myelopathy 4. Diffusely abnormal bone marrow throughout the vertebral bodies and  posterior elements from C4-T2. While this pattern could be seen due to advanced disc degeneration, further evaluation for infiltrating neoplastic process such is lymphoma or metastatic disease is warranted. 5. Given the motion degraded study, consider follow-up CT cervical spine. Also, would suggest a follow-up MRI of the cervical spine without with contrast, possibly with sedation, in the near future when the patient is able to hold still. 6. These results were called by telephone at the time of interpretation on 02/19/2019 at 11:20 am to Dr. Charlesetta Shanks , who verbally acknowledged these results. Electronically Signed: By: Franchot Gallo M.D. On: 02/19/2019 11:22   Mr Cervical Spine W Contrast  Result Date: 02/20/2019 CLINICAL DATA:  Myelopathy symptoms. EXAM: MRI CERVICAL SPINE WITH CONTRAST TECHNIQUE: Multiplanar, multisequence MR imaging of the cervical spine was performed following the administration of intravenous contrast. COMPARISON:  Noncontrast MRI the cervical spine 02/19/2019. MRI thoracic and lumbar spine described separately. FINDINGS: Alignment: Straightening of the normal cervical lordosis. Vertebrae: Diffusely abnormal C4 through C7 cervical vertebrae, including posterior elements consistent with metastatic disease. Cord: Significant cord compression maximal at C5, primarily due to bulky epidural tumor. There is a focal deposit in the midline, eccentric to the LEFT, cross-section 8 x 9 x 12 mm. The mass demonstrates T1 shortening precontrast, along with moderate enhancement after administration of gadolinium. Lateral extension of tumor inside the lamina toward the LEFT foramen. Significant narrowing the spinal canal, as narrow as 4 mm. Abnormal dural enhancement, primarily dorsal extends above and below the focal deposit. Posterior Fossa, vertebral arteries, paraspinal tissues: Unremarkable posterior fossa. Disc levels: Unchanged from yesterday's CT. IMPRESSION: Widespread osseous metastatic  disease C4 through C7. Additional metastatic disease described separately. Enhancing epidural tumor maximal at the C5 vertebral segment dorsally, eccentric to the LEFT. Significant spinal stenosis and cord compression. 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 Precontrast T1 shortening raises the question of coexistent recent hemorrhage. Electronically Signed   By: Staci Righter M.D.   On: 02/20/2019 12:47   Mr Thoracic Spine W Wo Contrast  Result Date: 02/20/2019 CLINICAL DATA:  Continued surveillance of suspected metastatic disease. Myelopathy. Neck pain. EXAM: MRI THORACIC WITHOUT AND WITH CONTRAST TECHNIQUE: Multiplanar and multiecho pulse sequences of the thoracic spine were obtained without and with intravenous contrast. CONTRAST:  Gadavist 7 mL. COMPARISON:  MRI cervical spine and lumbar spine reported separately. FINDINGS: MRI THORACIC SPINE FINDINGS Alignment:  Physiologic. Vertebrae: Widespread metastatic disease throughout all visualized thoracic vertebrae, with the greatest involvement in T1, T2, T3, T4, T7, and T11. No pathologic compression fracture. Cord:  Normal signal and morphology.  No epidural tumor. Paraspinal and other soft tissues: No significant paravertebral tumor. Disc levels: No compressive disc protrusion or spinal stenosis. IMPRESSION: Widespread metastatic disease throughout all visualized thoracic vertebrae. No pathologic compression fracture. No epidural tumor. Electronically Signed   By: Staci Righter M.D.   On: 02/20/2019 12:54   Mr Lumbar Spine W Wo Contrast  Result Date: 02/20/2019 CLINICAL DATA:  Continued surveillance suspected prostate cancer with metastatic disease. EXAM: MRI LUMBAR SPINE WITHOUT AND WITH CONTRAST TECHNIQUE: Multiplanar and multiecho pulse sequences of the lumbar spine were obtained without and with intravenous contrast. CONTRAST:  Gadavist 7 mL. COMPARISON:  MRI cervical and thoracic spine reported separately.  Correlate with CT abdomen and pelvis from 02/19/2019. FINDINGS: Segmentation:  Standard. Alignment:  Anatomic Vertebrae: Widespread metastatic disease throughout all visualized  lumbar vertebrae. No pathologic fracture. Abnormal foci of signal abnormality representing metastatic disease involve the sacrum and both iliac bones. Conus medullaris and cauda equina: Conus extends to the L1 level. Conus and cauda equina appear normal. No epidural tumor. Paraspinal and other soft tissues: Marked bladder distension due to outlet obstruction, prostatomegaly. BILATERAL hydronephrosis. Retroperitoneal adenopathy. Disc levels: No compressive disc protrusion or spinal stenosis. IMPRESSION: Widespread metastatic disease throughout all visualized lumbar vertebrae. No pathologic fracture. No epidural tumor. Prostatomegaly, BILATERAL hydronephrosis, and retroperitoneal adenopathy, sequelae of prostate cancer with regional metastases. Electronically Signed   By: Staci Righter M.D.   On: 02/20/2019 13:02   US Renal  Result Date: 02/20/2019 CLINICAL DATA:  63 year old male with hydronephrosis. EXAM: RENAL / URINARY TRACT ULTRASOUND COMPLETE COMPARISON:  CT Abdomen and Pelvis 02/19/2019. FINDINGS: Right Kidney: Renal measurements: 11.6 x 4.9 x 5.9 centimeters = volume: 175 mL. Moderate hydronephrosis appears stable since yesterday. Right hydroureter measures 10 millimeters (image 17). The right kidney appears mildly echogenic on image 2. No right renal mass. Left Kidney: Renal measurements: 10.6 x 6.7 x 5.4 centimeters = volume: 202 mL. Moderate hydronephrosis is stable since yesterday. Left hydroureter partially visible on image 30. Left renal echogenicity appears similar to that on the right. No left renal mass. Bladder: Dilated with evidence of small bladder diverticula (image 36) and trabeculated appearance of the bladder. Estimated bladder volume 751 milliliters. Unchanged postvoid bladder volume. The right ureteral jet is  detected with Doppler. There is a small 9-10 millimeter polypoid but nonvascular lesion located dependently within the bladder on images 42 and 43. Other findings: Irregular contour of the prostate which measures 7.3 x 4.8 x 4.8 centimeters (image 44). IMPRESSION: 1. Dilated urinary bladder (751 mL) with no postvoid emptying and stable moderate bilateral hydronephrosis and hydroureter since yesterday. 2. Small 10 mm bladder polyp or dependent tumefactive debris (image 42). Small bladder diverticula also noted. Electronically Signed   By: Genevie Ann M.D.   On: 02/20/2019 09:51      IMPRESSION/PLAN:  This visit was conducted via telephone with the patient's permission in light of the current COVID-19 pandemic. 65. 63 y.o. 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 Dr. Tammi Klippel and I have reviewed the patient's case and imaging and presented this information at the multidisciplinary brain and spine conference this morning.  Consensus recommendation is for a 10-day course of postoperative adjuvant conventional radiotherapy to the cervical spine at the levels of his recent decompression/laminectomies from C4-C6.  He is currently only 2 days postop currently and we would not recommend beginning any radiation until at least 2 weeks postoperatively to allow for surgical healing.  The patient reports that he plans to return to [place], where he lives, at discharge, hopefully by Friday, 02/24/2019 and that he would prefer to have any radiation treatment performed in [place]. He was started on Firmagon for ADT on 02/20/19 at the recommendation of Dr. Jeffie Pollock and plans to follow-up with a urologist at [facility] (Dr. Jeffie Pollock will facilitate referral) for further disease management and has requested recommendation for a radiation oncologist in that area as well. I have placed an  online referral to Dr. Elisabeth Most, in radiation oncology at [facility] (confirmation # [id number]) and would request that social work be involved to help coordinate this consult visit prior to his discharge home.  He will need copies of  all of his hospital records from this admission including consult and progress notes, op note, pathology, labs and copies of his imaging on disc for him to take with him for continued care in [place].  Given current concerns for patient exposure during the COVID-19 pandemic, this encounter was conducted via telephone.  The patient has given verbal consent for this type of encounter. The attendants for this meeting include Artie Takayama PA-C, and patient, Chris Morales. During the encounter, Brandee Markin PA-C was located at [facility] Radiation Oncology Department.  Patient, Chris Morales was located at [facility], 3W, room 38. In a visit lasting 70 minutes, greater than 50% of that time was spent in telephone conversaion, discussing his case and coordinating his care.   Nicholos Johns, PA-C    Tyler Pita, MD  [facility] Direct Dial: [phone]   Fax: [phone] [email or web address]   Skype   LinkedIn

## 2019-02-22 NOTE — Progress Notes (Signed)
Overall doing well.  Patient with significant urinary retention.  Pain well controlled.  Upper and lower extremity symptoms remain much improved.  Wound clean and dry.  Drain output low.  Awake and alert.  Oriented and appropriate.  Motor and sensory function intact.  Wound healing well.  Replace Foley catheter.  Mobilize more today.  Likely discharge home tomorrow.

## 2019-02-22 NOTE — Progress Notes (Signed)
[facility]                                                                              Patient Demographics  Chris Morales, is a 63 y.o. male, DOB - [birth year], RSW:[id number]  Admit date - 02/19/2019   Admitting Physician Earnie Larsson, MD  Outpatient Primary MD for the patient is patient visiting from [place] specialists:   LOS - 3  days   Medical records reviewed and are as summarized below:      Brief summary   Patient is a 63 year old male with no significant past medical history who resides in [place] and works as a Pharmacist, community resented with progressive weakness in his upper extremity left more than right, electrical sensations in his arms.  Patient reported that his symptoms started a week ago after he woke up and felt a "crick" in his cervical spine, felt a difficulty with gait.  No bowel or bladder dysfunction although he felt that he had increased urinary urgency, frequency and felt it could be due to his prostate. COVID-19 test negative  Assessment & Plan   Cervical myelopathy (HCC) with progressive weakness in his arms, tingling, left worse than right secondary to cervical spine compression from metastasis: CT c spine showed abnormal sclerosis in the cervical vertebrae and upper thoracic vertebrae particularly at C5-C6, concern for metastatic disease specially prostate CA, lymphoma or other carcinomas.  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  Was on decadron iv 6/21 and now switched to medrol dose pack per primay.drain in place in back. Pain controlled.  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  Patient was having symptoms of urinary frequency and urgency and attributed probably due to possibly having BPH -MRI of the cervical, thoracic, lumbar spine showed widespread osseous metastatic disease C4-C7, enhancing epidural tumor at C5.  Widespread metastatic disease throughout all visualized thoracic and lumbar vertebrae -Urology following, seen by Dr. Jeffie Pollock, started on Firmagon today, he is not particularly interested in orchiectomy at this time.  Follow urology recommendations while inpatient, will need further management in [place].  Leukocytosis : WBC trending up however patient is afebrile denies respiratory symptoms flank pain or any urinary symptoms besides retention.  Back incision looks clean dry intact.  Suspect this is secondary to his IV Decadron.  Continue to monitor CBC ordered for morning.  Is being switched to Medrol Dosepak. Recent Labs  Lab 02/19/19 0712 02/21/19 0539 02/22/19 0447  WBC 5.9 14.8* 20.4*    Urine retention : Continue in and out.  If he has significant retention may need Foley catheter, but he is not interested in catheter, will defer to urology. Cont flomax.  AKI: Unclear baseline creatinine, 1.7 at the time of admission. Renal ultrasound shows bilateral hydronephrosis, distended bladder. Foley catheter placed  and now on inad and out cath.  Suspect multifactorial in the setting of obstructive etiology from prostate cancer.  Seen by urology. -Continue IV fluid hydration, creatinine improving and stable at 1.5 Recent Labs  Lab 02/19/19 0712 02/19/19 0721 02/20/19 0657 02/21/19 0539 02/22/19 0447  BUN 19 21 20 22  29*  CREATININE 1.84* 1.70* 1.57* 1.52* 1.54*    Anemia: Likely due to underlying malignancy, normocytic: Hemoglobin is stable.  Monitor. Recent Labs  Lab 02/19/19 0712 02/19/19 0721 02/21/19 0539 02/22/19 0447  HGB 11.7* 12.9* 12.1* 10.7*  HCT 37.0* 38.0* 37.6* 32.7*   Hyperglycemia, A1c 5.2. REACTIVE  Hypertension:  Elevated BP readings on admit, patient reports no prior history of hypertension. BP stable now not on meds.    Code Status: Full CODE STATUS DVT Prophylaxis:  SCD's Family Communication/Dispo: Discussed plan of care with the patient from hospitalist standpoint.  Discussed with the nursing staff.  It seems patient will likely be discharged home tomorrow.  Monitor CBC in the morning.  Procedures:  C4, C5, C6 laminectomy and resection of epidural metastatic tumor, microdiscectomy  Consultants:   Neurosurgery Urology, Dr. Jeffie Pollock  Antimicrobials:   Anti-infectives (From admission, onward)   Start     Dose/Rate Route Frequency Ordered Stop   02/20/19 1845  ceFAZolin (ANCEF) IVPB 1 g/50 mL premix     1 g 100 mL/hr over 30 Minutes Intravenous Every 8 hours 02/20/19 1844 02/21/19 1044   02/20/19 1515  bacitracin 50,000 Units in sodium chloride 0.9 % 500 mL irrigation  Status:  Discontinued       As needed 02/20/19 1515 02/20/19 1623   02/20/19 1300  ceFAZolin (ANCEF) IVPB 2g/100 mL premix     2 g 200 mL/hr over 30 Minutes Intravenous 30 min pre-op 02/20/19 1247 02/20/19 1452         Medications  Scheduled Meds:  methylPREDNISolone  4 mg Oral 3 x daily with food   [START ON 02/23/2019] methylPREDNISolone  4 mg Oral 4X daily taper   methylPREDNISolone  8 mg Oral Nightly   mupirocin ointment  1 application Nasal BID   sodium chloride flush  3 mL Intravenous Q12H   tamsulosin  0.4 mg Oral Daily   Continuous Infusions:  sodium chloride 75 mL/hr at 02/22/19 0714   sodium chloride     lactated ringers     PRN Meds:.acetaminophen **OR** acetaminophen, bisacodyl, diazepam, hydrALAZINE, HYDROcodone-acetaminophen, HYDROmorphone (DILAUDID) injection, menthol-cetylpyridinium **OR** phenol, ondansetron **OR** ondansetron (ZOFRAN) IV, oxyCODONE, polyethylene glycol, sodium chloride flush, sodium phosphate      Subjective:   Patient resting comfortably on the bedside chair.   Denies nausea vomiting fever chills cough dysuria flank pain.  Having urine retention.  Objective:   Vitals:   02/21/19 1921 02/21/19 2310 02/22/19 0300 02/22/19 0739  BP: (!) 150/87 (!) 170/100 (!) 149/88 (!) 142/93  Pulse: 88 75 64 68  Resp: 18 18 18 18   Temp: 98.6 F (37 C) 98.1 F (36.7 C) 98 F (36.7 C) 98.8 F (37.1 C)  TempSrc: Oral Oral Oral Oral  SpO2: 100% 99% 99% 100%  Weight:      Height:        Intake/Output Summary (Last 24 hours) at 02/22/2019 1028 Last data filed at 02/22/2019 0800 Gross per 24 hour  Intake 70 ml  Output 2800 ml  Net -2730 ml     Wt Readings from Last 3 Encounters:  02/19/19 73.9 kg    Physical Exam General exam: Calm, comfortable, not in acute  distress, older for age, average built.  HEENT:Oral mucosa moist, Ear/Nose WNL grossly, dentition normal. Respiratory system: Bilateral equal air entry, no crackles and wheezing, no use of accessory muscle, non tender on palpation. Cardiovascular system: regular rate and rhythm, S1 & S2 heard, No JVD/murmurs. Gastrointestinal system: Suprapubic distention +.  abdomen soft, non-tender, non-distended, BS +. No hepatosplenomegaly palpable. Nervous System:Alert, awake and oriented at baseline. Able to move UE and LE, sensation intact. Extremities: No edema, distal peripheral pulses palpable.  Skin: No rashes,no icterus. MSK: Normal muscle bulk,tone, power Posterior neck incision is clean dry intact with drain in place  Data Reviewed:  I have personally reviewed following labs and imaging studies  Micro Results Recent Results (from the past 240 hour(s))  SARS Coronavirus 2 (CEPHEID - Performed in [place] hospital lab), Hosp Order     Status: None   Collection Time: 02/19/19  4:21 PM   Specimen: Nasopharyngeal Swab  Result Value Ref Range Status   SARS Coronavirus 2 NEGATIVE NEGATIVE Final    Comment: (NOTE) If result is NEGATIVE SARS-CoV-2 target nucleic acids are NOT DETECTED. The  SARS-CoV-2 RNA is generally detectable in upper and lower  respiratory specimens during the acute phase of infection. The lowest  concentration of SARS-CoV-2 viral copies this assay can detect is 250  copies / mL. A negative result does not preclude SARS-CoV-2 infection  and should not be used as the sole basis for treatment or other  patient management decisions.  A negative result may occur with  improper specimen collection / handling, submission of specimen other  than nasopharyngeal swab, presence of viral mutation(s) within the  areas targeted by this assay, and inadequate number of viral copies  (<250 copies / mL). A negative result must be combined with clinical  observations, patient history, and epidemiological information. If result is POSITIVE SARS-CoV-2 target nucleic acids are DETECTED. The SARS-CoV-2 RNA is generally detectable in upper and lower  respiratory specimens dur ing the acute phase of infection.  Positive  results are indicative of active infection with SARS-CoV-2.  Clinical  correlation with patient history and other diagnostic information is  necessary to determine patient infection status.  Positive results do  not rule out bacterial infection or co-infection with other viruses. If result is PRESUMPTIVE POSTIVE SARS-CoV-2 nucleic acids MAY BE PRESENT.   A presumptive positive result was obtained on the submitted specimen  and confirmed on repeat testing.  While 2019 novel coronavirus  (SARS-CoV-2) nucleic acids may be present in the submitted sample  additional confirmatory testing may be necessary for epidemiological  and / or clinical management purposes  to differentiate between  SARS-CoV-2 and other Sarbecovirus currently known to infect humans.  If clinically indicated additional testing with an alternate test  methodology [phone]) is advised. The SARS-CoV-2 RNA is generally  detectable in upper and lower respiratory sp ecimens during the acute    phase of infection. The expected result is Negative. Fact Sheet for Patients:  StrictlyIdeas.no Fact Sheet for Healthcare Providers: BankingDealers.co.za This test is not yet approved or cleared by the [place] FDA and has been authorized for detection and/or diagnosis of SARS-CoV-2 by FDA under an Emergency Use Authorization (EUA).  This EUA will remain in effect (meaning this test can be used) for the duration of the COVID-19 declaration under Section 564(b)(1) of the Act, 21 U.S.C. section 360bbb-3(b)(1), unless the authorization is terminated or revoked sooner. Performed at [facility], [place] [address], [place], [place] [address]  Surgical pcr screen     Status: Abnormal   Collection Time: 02/20/19  1:06 PM   Specimen: Nasal Mucosa; Nasal Swab  Result Value Ref Range Status   MRSA, PCR POSITIVE (A) NEGATIVE Final    Comment: RESULT CALLED TO, READ BACK BY AND VERIFIED WITH: Cleotis Nipper RN 14:45 02/20/19 ([email or web address])    Staphylococcus aureus POSITIVE (A) NEGATIVE Final    Comment: (NOTE) The Xpert SA Assay (FDA approved for NASAL specimens in patients 63 years of age and older), is one component of a comprehensive surveillance program. It is not intended to diagnose infection nor to guide or monitor treatment. Performed at [facility], [place] [address] [place], [place] [address]     Radiology Reports Ct Abdomen Pelvis Wo Contrast  Result Date: 02/19/2019 CLINICAL DATA:  Sclerotic lesions in cervical spine. Evaluate for multiple myeloma or solid organ malignancy. EXAM: CT ABDOMEN AND PELVIS WITHOUT CONTRAST TECHNIQUE: Multidetector CT imaging of the abdomen and pelvis was performed following the standard protocol without IV contrast. COMPARISON:  None. FINDINGS: Lower chest: 7 mm subpleural pulmonary nodule left lower lobe, image 6 series 4. No acute airspace disease. No pleural fluid. No focal airspace disease. No  pleural fluid. Hepatobiliary: Evaluation for focal lesion is limited in the absence of IV contrast. Allowing for this, no focal lesion is visualized. Multiple calcified granuloma. Gallbladder physiologically distended, no calcified stone. No biliary dilatation. Pancreas: Evaluation is limited by absence of IV contrast and paucity of intra-abdominal fat. No evidence of focal pancreatic abnormality. No ductal dilatation or inflammation. Spleen: Spleen is normal to small in size spanning 8.1 cm. Multiple calcified granuloma. Adrenals/Urinary Tract: No evidence of adrenal nodule, adrenal glands poorly visualized. Bilateral hydroureteronephrosis. Ureters are dilated to the bladder insertion. No obvious renal mass on noncontrast exam. The urinary bladder is distended measuring 11.2 x 9.8 x 15.1 cm (volume = 870 cm^3). Mild bladder wall thickening. No obvious bladder mass on noncontrast exam. Stomach/Bowel: Administered enteric contrast reaches the mid distal small bowel. No obstruction. Stomach physiologically distended without obvious gastric mass. No bowel wall thickening or inflammatory change. Normal appendix. Moderate stool burden throughout the colon. No obvious colonic mass. No colonic wall thickening. Vascular/Lymphatic: Suspected retroperitoneal adenopathy extending from the level of the left renal vein to the iliac bifurcation. Largest node in the left periaortic station measuring 19 mm short axis, image 28 series 3. Lack of IV contrast and paucity of intra-abdominal fat limits assessment. There is left internal and external iliac adenopathy, largest node in the left internal iliac chain measuring 2.3 cm, image 64. Right external iliac/pelvic sidewall adenopathy at 2.2 cm, slightly increased nodal density. Small bilateral inguinal nodes. Reproductive: Prostate gland spans 4.4 x 4.9 cm. Soft tissue prominence of the right seminal vesicle. Other: Minimal fat in the left inguinal canal. No ascites or free air.  Musculoskeletal: Sclerotic lesions throughout the included thoracic and lumbar spine. Lesions in every lumbar vertebral body, largest involving L5. Sclerotic lesion and right posterior tenth rib. Multiple sclerotic lesions in the pelvis. IMPRESSION: 1. Findings suspicious for primary prostate malignancy. Bilateral pelvic and retroperitoneal adenopathy. Multifocal sclerotic osseous lesions. Prominent prostate gland with distended urinary bladder and bilateral hydronephrosis. 2. A 7 mm left lower lobe pulmonary nodule is nonspecific, but metastatic disease is considered. 3. Sequela of prior granulomatous disease with splenic and hepatic calcifications. Electronically Signed   By: Keith Rake M.D.   On: 02/19/2019 22:53   Dg Chest 2 View  Result Date: 02/19/2019  CLINICAL DATA:  Chest pain. EXAM: CHEST - 2 VIEW COMPARISON:  None. FINDINGS: The cardiac silhouette, mediastinal and hilar contours are within normal limits. The lungs are clear of acute process. No infiltrates, edema or effusions. Rounded density in the left lower chest or could reflect a nipple shadow. A follow-up PA chest film with nipple markers may be helpful to exclude a pulmonary nodule. The bony thorax is intact. IMPRESSION: 1. No acute cardiopulmonary findings. 2. Nodular density in the left lower lobe could reflect a nipple shadow or overlapping bony and vascular markings. A repeat PA chest film with nipple markers may be helpful when able, for further evaluation. Electronically Signed   By: Marijo Sanes M.D.   On: 02/19/2019 14:45   Dg Cervical Spine 1 View  Result Date: 02/20/2019 CLINICAL DATA:  Epidural tumor. EXAM: DG CERVICAL SPINE - 1 VIEW COMPARISON:  Cervical MRI dated 02/20/2019 FINDINGS: Lateral view of the cervical spine demonstrates an instrument at the C4-5 level posteriorly. This is the level of the previously demonstrated epidural tumor. Note is made of abnormal sclerosis and patchy lucency in the vertebra at C4, C5, C6,  and C7 consistent with the tumor demonstrated on the prior exam. IMPRESSION: Localization instrument at C4-5. Electronically Signed   By: Lorriane Shire M.D.   On: 02/20/2019 16:51   Ct Head Wo Contrast  Result Date: 02/19/2019 CLINICAL DATA:  63 year old and possible stroke. EXAM: CT HEAD WITHOUT CONTRAST TECHNIQUE: Contiguous axial images were obtained from the base of the skull through the vertex without intravenous contrast. COMPARISON:  06/18/2014 FINDINGS: Brain: No evidence of acute infarction, hemorrhage, hydrocephalus, extra-axial collection or mass lesion/mass effect. Vascular: No hyperdense vessel or unexpected calcification. Skull: Normal. Negative for fracture or focal lesion. Sinuses/Orbits: Mucosal disease in the right maxillary sinus which is partially imaged. Other: None IMPRESSION: No acute intracranial abnormality. Right maxillary sinus disease. Electronically Signed   By: Markus Daft M.D.   On: 02/19/2019 08:24   Ct Cervical Spine Wo Contrast  Result Date: 02/19/2019 CLINICAL DATA:  C-spine trauma. Myelopathy.  Abnormal MRI. EXAM: CT CERVICAL SPINE WITHOUT CONTRAST TECHNIQUE: Multidetector CT imaging of the cervical spine was performed without intravenous contrast. Multiplanar CT image reconstructions were also generated. COMPARISON:  MRI cervical spine 02/19/2019 FINDINGS: Alignment: Reversal of the normal cervical lordotic curve. Skull base and vertebrae: Abnormal sclerosis of the cervical vertebrae, C4 through C7, and the visualized thoracic vertebrae, T1 and T2, also involving the facets and other posterior elements, slight remodeling with loss of height particularly at C5 and C6. Concern for metastatic disease. Soft tissues and spinal canal: The spinal canal is poorly visualized on this exam. However, correlating with MRI, and given the appearance of the vertebral bodies, posterior epidural tumor at C5 is a differential concern. Disc levels: Near complete loss of interspace height  from C4 through C7-T1. This results in varying degrees of foraminal narrowing. Upper chest: Negative. Other: None. IMPRESSION: 1. Abnormal sclerosis of the cervical vertebrae, and visualized upper thoracic vertebrae, with slight remodeling and loss of height particularly at C5 and C6. Concern for metastatic disease, specifically prostate cancer in a male. Less common considerations would include transitional cell carcinoma, mucinous adenocarcinomas, or lymphoma. 2. The spinal canal is poorly visualized on this exam, but given the appearance of the vertebral bodies, posterior epidural tumor at C5 is a differential consideration, along with hemorrhage or infection. 3. Consider MRI with contrast for further evaluation, to evaluate the posterior epidural space. As the noncontrast study  was considerably degraded, consider premedication with pain medication or anxiolytics. Electronically Signed   By: Staci Righter M.D.   On: 02/19/2019 15:25   Mr Brain Wo Contrast (neuro Protocol)  Result Date: 02/19/2019 CLINICAL DATA:  Focal neuro deficit greater than 6 hours. Suspect stroke. EXAM: MRI HEAD WITHOUT CONTRAST TECHNIQUE: Multiplanar, multiecho pulse sequences of the brain and surrounding structures were obtained without intravenous contrast. COMPARISON:  CT head 02/19/2019 FINDINGS: Brain: Image quality degraded by motion. Negative for acute or chronic infarction. Ventricle size normal. Negative for mass or edema. Negative for hemorrhage or fluid collection. Vascular: Normal arterial flow voids Skull and upper cervical spine: Normal skull. Abnormal bone marrow throughout the C4 vertebral body. C2 and C3 vertebral bodies normal. Sinuses/Orbits: Mucosal edema paranasal sinuses.  Normal orbit Other: None IMPRESSION: Image quality degraded by motion Negative MRI of the brain without contrast Diffusely abnormal bone marrow at C4.  Possible tumor involvement. Electronically Signed   By: Franchot Gallo M.D.   On: 02/19/2019  11:06   Mr Cervical Spine Wo Contrast  Addendum Date: 02/19/2019   ADDENDUM REPORT: 02/19/2019 13:07 ADDENDUM: After further review and discussion with Dr. Trenton Gammon, there appears to be a posterior epidural process at the C5 bilaterally. This appears to be contributing to spinal stenosis. This has increased signal on T1 and decreased signal intensity on T2. Possible hemorrhage or abscess. MRI cervical spine without with contrast preferably with sedation would be helpful for further evaluation. Also consider CT cervical spine. Electronically Signed   By: Franchot Gallo M.D.   On: 02/19/2019 13:07   Result Date: 02/19/2019 CLINICAL DATA:  Neck pain. Radiculopathy with arm pain and weakness. EXAM: MRI CERVICAL SPINE WITHOUT CONTRAST TECHNIQUE: Multiplanar, multisequence MR imaging of the cervical spine was performed. No intravenous contrast was administered. COMPARISON:  None. FINDINGS: Alignment: Image quality degraded by significant motion. Normal alignment.  Cervical kyphosis. Vertebrae: Negative for fracture. Diffuse bone marrow abnormality in the cervical spine. The vertebral bodies show low signal on T1 and T2 from C4 through T2 with similar changes in the posterior elements. There is also similar change in the posterior elements but not vertebral body of C3. Cord: Cord evaluation limited by motion however there is possible cord edema throughout the cervical spine from approximately C4 through C6. Posterior Fossa, vertebral arteries, paraspinal tissues: Negative Disc levels: C2-3: Negative C3-4: Mild spinal stenosis. Disc degeneration and diffuse uncinate spurring. Moderate foraminal narrowing bilaterally C4-5: Moderate spinal stenosis. Disc degeneration and spurring. Moderate foraminal stenosis bilaterally C5-6: Disc degeneration and spondylosis. Moderate left foraminal stenosis and mild-to-moderate spinal stenosis C6-7: Disc degeneration and spondylosis. Mild spinal stenosis and mild foraminal stenosis  bilaterally C7-T1: Moderate disc degeneration and spurring. Moderate foraminal stenosis bilaterally. Mild spinal stenosis. IMPRESSION: 1. Image quality degraded by significant motion. 2. 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 3. Cord evaluation limited due to motion however there is suggestion of cord edema throughout much of the cervical spine from C4 through C6. This could be due to myelopathy 4. Diffusely abnormal bone marrow throughout the vertebral bodies and posterior elements from C4-T2. While this pattern could be seen due to advanced disc degeneration, further evaluation for infiltrating neoplastic process such is lymphoma or metastatic disease is warranted. 5. Given the motion degraded study, consider follow-up CT cervical spine. Also, would suggest a follow-up MRI of the cervical spine without with contrast, possibly with sedation, in the near future when the patient  is able to hold still. 6. These results were called by telephone at the time of interpretation on 02/19/2019 at 11:20 am to Dr. Charlesetta Shanks , who verbally acknowledged these results. Electronically Signed: By: Franchot Gallo M.D. On: 02/19/2019 11:22   Mr Cervical Spine W Contrast  Result Date: 02/20/2019 CLINICAL DATA:  Myelopathy symptoms. EXAM: MRI CERVICAL SPINE WITH CONTRAST TECHNIQUE: Multiplanar, multisequence MR imaging of the cervical spine was performed following the administration of intravenous contrast. COMPARISON:  Noncontrast MRI the cervical spine 02/19/2019. MRI thoracic and lumbar spine described separately. FINDINGS: Alignment: Straightening of the normal cervical lordosis. Vertebrae: Diffusely abnormal C4 through C7 cervical vertebrae, including posterior elements consistent with metastatic disease. Cord: Significant cord compression maximal at C5, primarily due to bulky epidural tumor. There is a focal deposit in the  midline, eccentric to the LEFT, cross-section 8 x 9 x 12 mm. The mass demonstrates T1 shortening precontrast, along with moderate enhancement after administration of gadolinium. Lateral extension of tumor inside the lamina toward the LEFT foramen. Significant narrowing the spinal canal, as narrow as 4 mm. Abnormal dural enhancement, primarily dorsal extends above and below the focal deposit. Posterior Fossa, vertebral arteries, paraspinal tissues: Unremarkable posterior fossa. Disc levels: Unchanged from yesterday's CT. IMPRESSION: Widespread osseous metastatic disease C4 through C7. Additional metastatic disease described separately. Enhancing epidural tumor maximal at the C5 vertebral segment dorsally, eccentric to the LEFT. Significant spinal stenosis and cord compression. 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 Precontrast T1 shortening raises the question of coexistent recent hemorrhage. Electronically Signed   By: Staci Righter M.D.   On: 02/20/2019 12:47   Mr Thoracic Spine W Wo Contrast  Result Date: 02/20/2019 CLINICAL DATA:  Continued surveillance of suspected metastatic disease. Myelopathy. Neck pain. EXAM: MRI THORACIC WITHOUT AND WITH CONTRAST TECHNIQUE: Multiplanar and multiecho pulse sequences of the thoracic spine were obtained without and with intravenous contrast. CONTRAST:  Gadavist 7 mL. COMPARISON:  MRI cervical spine and lumbar spine reported separately. FINDINGS: MRI THORACIC SPINE FINDINGS Alignment:  Physiologic. Vertebrae: Widespread metastatic disease throughout all visualized thoracic vertebrae, with the greatest involvement in T1, T2, T3, T4, T7, and T11. No pathologic compression fracture. Cord:  Normal signal and morphology.  No epidural tumor. Paraspinal and other soft tissues: No significant paravertebral tumor. Disc levels: No compressive disc protrusion or spinal stenosis. IMPRESSION: Widespread metastatic disease throughout all  visualized thoracic vertebrae. No pathologic compression fracture. No epidural tumor. Electronically Signed   By: Staci Righter M.D.   On: 02/20/2019 12:54   Mr Lumbar Spine W Wo Contrast  Result Date: 02/20/2019 CLINICAL DATA:  Continued surveillance suspected prostate cancer with metastatic disease. EXAM: MRI LUMBAR SPINE WITHOUT AND WITH CONTRAST TECHNIQUE: Multiplanar and multiecho pulse sequences of the lumbar spine were obtained without and with intravenous contrast. CONTRAST:  Gadavist 7 mL. COMPARISON:  MRI cervical and thoracic spine reported separately. Correlate with CT abdomen and pelvis from 02/19/2019. FINDINGS: Segmentation:  Standard. Alignment:  Anatomic Vertebrae: Widespread metastatic disease throughout all visualized lumbar vertebrae. No pathologic fracture. Abnormal foci of signal abnormality representing metastatic disease involve the sacrum and both iliac bones. Conus medullaris and cauda equina: Conus extends to the L1 level. Conus and cauda equina appear normal. No epidural tumor. Paraspinal and other soft tissues: Marked bladder distension due to outlet obstruction, prostatomegaly. BILATERAL hydronephrosis. Retroperitoneal adenopathy. Disc levels: No compressive disc protrusion or spinal stenosis. IMPRESSION: Widespread metastatic disease throughout all visualized lumbar vertebrae. No pathologic fracture. No  epidural tumor. Prostatomegaly, BILATERAL hydronephrosis, and retroperitoneal adenopathy, sequelae of prostate cancer with regional metastases. Electronically Signed   By: Staci Righter M.D.   On: 02/20/2019 13:02   US Renal  Result Date: 02/20/2019 CLINICAL DATA:  63 year old male with hydronephrosis. EXAM: RENAL / URINARY TRACT ULTRASOUND COMPLETE COMPARISON:  CT Abdomen and Pelvis 02/19/2019. FINDINGS: Right Kidney: Renal measurements: 11.6 x 4.9 x 5.9 centimeters = volume: 175 mL. Moderate hydronephrosis appears stable since yesterday. Right hydroureter measures 10  millimeters (image 17). The right kidney appears mildly echogenic on image 2. No right renal mass. Left Kidney: Renal measurements: 10.6 x 6.7 x 5.4 centimeters = volume: 202 mL. Moderate hydronephrosis is stable since yesterday. Left hydroureter partially visible on image 30. Left renal echogenicity appears similar to that on the right. No left renal mass. Bladder: Dilated with evidence of small bladder diverticula (image 36) and trabeculated appearance of the bladder. Estimated bladder volume 751 milliliters. Unchanged postvoid bladder volume. The right ureteral jet is detected with Doppler. There is a small 9-10 millimeter polypoid but nonvascular lesion located dependently within the bladder on images 42 and 43. Other findings: Irregular contour of the prostate which measures 7.3 x 4.8 x 4.8 centimeters (image 44). IMPRESSION: 1. Dilated urinary bladder (751 mL) with no postvoid emptying and stable moderate bilateral hydronephrosis and hydroureter since yesterday. 2. Small 10 mm bladder polyp or dependent tumefactive debris (image 42). Small bladder diverticula also noted. Electronically Signed   By: Genevie Ann M.D.   On: 02/20/2019 09:51    Lab Data:  CBC: Recent Labs  Lab 02/19/19 0712 02/19/19 0721 02/21/19 0539 02/22/19 0447  WBC 5.9  --  14.8* 20.4*  NEUTROABS 3.4  --   --   --   HGB 11.7* 12.9* 12.1* 10.7*  HCT 37.0* 38.0* 37.6* 32.7*  MCV 92.5  --  89.3 88.4  PLT 222  --  258 412   Basic Metabolic Panel: Recent Labs  Lab 02/19/19 0712 02/19/19 0721 02/20/19 0657 02/21/19 0539 02/22/19 0447  NA 139 140 138 137 141  K 3.8 3.8 4.3 4.7 4.4  CL 106 106 106 107 108  CO2 24  --  22 22 25   GLUCOSE 174* 172* 152* 177* 113*  BUN 19 21 20 22  29*  CREATININE 1.84* 1.70* 1.57* 1.52* 1.54*  CALCIUM 8.7*  --  8.6* 8.3* 8.5*   GFR: Estimated Creatinine Clearance: 52 mL/min (A) (by C-G formula based on SCr of 1.54 mg/dL (H)). Liver Function Tests: Recent Labs  Lab 02/19/19 0712  AST  30  ALT 23  ALKPHOS 455*  BILITOT 0.9  PROT 6.5  ALBUMIN 3.4*   No results for input(s): LIPASE, AMYLASE in the last 168 hours. No results for input(s): AMMONIA in the last 168 hours. Coagulation Profile: Recent Labs  Lab 02/19/19 0712  INR 1.2   Cardiac Enzymes: No results for input(s): CKTOTAL, CKMB, CKMBINDEX, TROPONINI in the last 168 hours. BNP (last 3 results) No results for input(s): PROBNP in the last 8760 hours. HbA1C: Recent Labs    02/20/19 1238  HGBA1C 5.2   CBG: Recent Labs  Lab 02/19/19 1206  GLUCAP 76   Lipid Profile: No results for input(s): CHOL, HDL, LDLCALC, TRIG, CHOLHDL, LDLDIRECT in the last 72 hours. Thyroid Function Tests: No results for input(s): TSH, T4TOTAL, FREET4, T3FREE, THYROIDAB in the last 72 hours. Anemia Panel: Recent Labs    02/20/19 1238  VITAMINB12 595  FOLATE 12.2  FERRITIN 590*  TIBC 248*  IRON 60  RETICCTPCT 1.3   Urine analysis: No results found for: COLORURINE, APPEARANCEUR, LABSPEC, PHURINE, GLUCOSEU, HGBUR, BILIRUBINUR, KETONESUR, PROTEINUR, UROBILINOGEN, NITRITE, LEUKOCYTESUR   Antonieta Pert M.D. [facility] 02/22/2019, 10:28 AM  Pager: [phone] Between 7am to 7pm - call Pager - [phone]  After 7pm go to [email or web address] - password [email or web address]  Call night coverage person covering after 7pm

## 2019-02-22 NOTE — Progress Notes (Signed)
Foley cath placed for urinary retention per MD order

## 2019-02-23 LAB — IMMUNOGLOBULINS A/E/G/M, SERUM
IgA: 355 mg/dL (ref 61–437)
IgE (Immunoglobulin E), Serum: 1125 IU/mL — ABNORMAL HIGH (ref 6–495)
IgG (Immunoglobin G), Serum: 1236 mg/dL (ref 603–1613)
IgM (Immunoglobulin M), Srm: 48 mg/dL (ref 20–172)

## 2019-02-23 LAB — BASIC METABOLIC PANEL
Anion gap: 7 (ref 5–15)
BUN: 19 mg/dL (ref 8–23)
CO2: 26 mmol/L (ref 22–32)
Calcium: 8.3 mg/dL — ABNORMAL LOW (ref 8.9–10.3)
Chloride: 107 mmol/L (ref 98–111)
Creatinine, Ser: 1.34 mg/dL — ABNORMAL HIGH (ref 0.61–1.24)
GFR calc Af Amer: >60 mL/min (ref >60)
GFR calc non Af Amer: 56 mL/min — ABNORMAL LOW (ref >60)
Glucose, Bld: 108 mg/dL — ABNORMAL HIGH (ref 70–99)
Potassium: 3.9 mmol/L (ref 3.5–5.1)
Sodium: 140 mmol/L (ref 135–145)

## 2019-02-23 LAB — CBC
HCT: 32.7 % — ABNORMAL LOW (ref 39.0–52.0)
Hemoglobin: 10.7 g/dL — ABNORMAL LOW (ref 13.0–17.0)
MCH: 28.8 pg (ref 26.0–34.0)
MCHC: 32.7 g/dL (ref 30.0–36.0)
MCV: 88.1 fL (ref 80.0–100.0)
Platelets: 192 10*3/uL (ref 150–400)
RBC: 3.71 MIL/uL — ABNORMAL LOW (ref 4.22–5.81)
RDW: 13.4 % (ref 11.5–15.5)
WBC: 14.7 10*3/uL — ABNORMAL HIGH (ref 4.0–10.5)
nRBC: 0 % (ref 0.0–0.2)

## 2019-02-23 MED ORDER — HYDROCODONE-ACETAMINOPHEN 5-325 MG PO TABS: 1.0000 | ORAL_TABLET | ORAL | 0 refills | Status: DC | PRN

## 2019-02-23 MED ORDER — CHLORHEXIDINE GLUCONATE CLOTH 2 % EX PADS
6.0000 | MEDICATED_PAD | Freq: Every day | CUTANEOUS | Status: DC
  Administered 2019-02-23: 6 via TOPICAL

## 2019-02-23 MED ORDER — TAMSULOSIN HCL 0.4 MG PO CAPS: 0.4000 mg | ORAL_CAPSULE | Freq: Every day | ORAL | 2 refills | Status: DC

## 2019-02-23 MED FILL — TAMSULOSIN HCL 0.4 MG CAP: 0.4 | 30 days supply | Qty: 30 | Fill #0

## 2019-02-23 NOTE — TOC Transition Note (Signed)
Transition of Care [facility]) - CM/SW Discharge Note   Patient Details  Name: Chris Morales MRN: [id number] Date of Birth: 1956-08-19  [facility] CM/SW Contact:  Pollie Friar, RN Phone Number: 02/23/2019, 11:03 AM   Clinical Narrative:    Pt to f/u with MD in [place] and will see about outpatient therapy after f/u. Pt to find urologist, PCP in [place] upon return. Pt has transportation home.    Final next level of care: Home/Self Care Barriers to Discharge: Inadequate or no insurance   Patient Goals and CMS Choice     Choice offered to / list presented to : Patient  Discharge Placement                       Discharge Plan and Services   Discharge Planning Services: CM Consult                                 Social Determinants of Health (SDOH) Interventions     Readmission Risk Interventions No flowsheet data found.

## 2019-02-23 NOTE — Discharge Summary (Signed)
Physician Discharge Summary  Patient ID: Chris Morales MRN: [id number] DOB/AGE: [birth year] 63 y.o.  Admit date: 02/19/2019 Discharge date: 02/23/2019  Admission Diagnoses:  Discharge Diagnoses:  Principal Problem:   Cervical myelopathy ([place]) Active Problems:   AKI (acute kidney injury) ([place])   Anemia   Prostate CA [facility])   Discharged Condition: good  Hospital Course: Patient admitted to the hospital for evaluation and treatment of rapidly progressing cervical myelopathy.  Work-up demonstrated evidence of significant metastatic disease throughout his cervical and multiple areas of his thoracic spine.  Patient found to have significant metastatic disease in his cervical epidural space dorsally at the C5 level.  Patient was taken urgently to the operating room for C4, C5, C6 laminectomy and resection of epidural tumor.  Postoperative the patient has done quite well.  His preoperative neck pain and upper extremity numbness weakness and incoordination are all much improved.  He is now standing and walking without difficulty.  Patient's underlying condition was more thoroughly evaluated also upon admission to the hospital.  He was found to have a markedly elevated prostatic specific antigen level (270).  CT scanning of his chest and abdomen demonstrate metastatic disease to his lung and liver.  Also demonstrates severe changes within the prostate gland and pelvis.  Patient also noted to have significant hydronephrosis.  A urinary catheter was placed for treatment of urinary obstruction.  His kidney function improved dramatically with external drainage.  The patient did undergo a voiding trial while in the hospital and was unable to fully empty his bladder.  The patient's plan for indwelling Foley catheter for the next 8 weeks.  The patient is undergone discussions with Dr. Jeffie Pollock of the urology department with regard to treatment options.  He is decided to move forward with any testosterone drug  therapy.  Dr. Jeffie Pollock is arranging this.  He will obtain further follow-up in [place] where he lives.  The patient will require postoperative radiation to his cervical spine.  This should be done at least 2 weeks following his surgery.  This will also be done in [place] where he was.   Consults:   Significant Diagnostic Studies:   Treatments:   Discharge Exam: Blood pressure (!) 157/87, pulse 79, temperature 98.1 F (36.7 C), temperature source Oral, resp. rate 12, height 6\' 1"  (1.854 m), weight 73.9 kg, SpO2 100 %. The patient is awake and alert.  He is oriented and appropriate.  His speech is fluent.  His judgment insight are intact.  Cranial nerve function normal bilaterally.  Motor examination of his extremities 5/5.  Sensory examination normal.  Wound clean and dry.  Chest and abdomen benign.  Extremities free from injury or deformity.  Disposition: Discharge disposition: 01-Home or Self Care       Discharge Instructions    Urinary leg bag   Complete by: As directed      Allergies as of 02/23/2019   No Known Allergies     Medication List    TAKE these medications   HYDROcodone-acetaminophen 5-325 MG tablet Commonly known as: NORCO/VICODIN Take 1-2 tablets by mouth every 4 (four) hours as needed for moderate pain.   tamsulosin 0.4 MG Caps capsule Commonly known as: FLOMAX Take 1 capsule (0.4 mg total) by mouth daily. Start taking on: February 24, 2019        Signed: Charlie Pitter 02/23/2019, 9:49 AM

## 2019-02-23 NOTE — Discharge Instructions (Signed)

## 2019-02-23 NOTE — Progress Notes (Signed)
[facility]                                                                              Patient Demographics  Chris Morales, is a 63 y.o. male, DOB - November 07, 1955, KDX:[id number]  Admit date - 02/19/2019   Admitting Physician Earnie Larsson, MD  Outpatient Primary MD for the patient is patient visiting from [place] specialists:   LOS - 4  days   Medical records reviewed and are as summarized below:      Brief summary   Patient is a 63 year old male with no significant past medical history who resides in [place] and works as a Pharmacist, community resented with progressive weakness in his upper extremity left more than right, electrical sensations in his arms.  Patient reported that his symptoms started a week ago after he woke up and felt a "crick" in his cervical spine, felt a difficulty with gait.  No bowel or bladder dysfunction although he felt that he had increased urinary urgency, frequency and felt it could be due to his prostate. COVID-19 test negative. Patient is doing well, pain is controlled.  He had leukocytosis which is downtrending.  He is afebrile.  Patient need to have Foley catheter aplced.  Assessment & Plan   Cervical myelopathy ([place]) with progressive weakness in his arms, tingling, left worse than right secondary to cervical spine compression from metastasis: CT c spine showed abnormal sclerosis in the cervical vertebrae and upper thoracic vertebrae particularly at C5-C6, concern for metastatic disease specially prostate CA, lymphoma or other carcinomas.  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  Was on decadron iv 6/21 and now switched to medrol dose pack per primay. Pain controlled.  He is being discharged per primary  team.  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  Patient was having symptoms of urinary frequency and urgency and attributed probably due to possibly having BPH -MRI of the cervical, thoracic, lumbar spine showed widespread osseous metastatic disease C4-C7, enhancing epidural tumor at C5.  Widespread metastatic disease throughout all visualized thoracic and lumbar vertebrae -Urology following, seen by Dr. Windell Moulding is not particularly interested in orchiectomy at this time.  Follow urology recommendations while inpatient, will need further management in [place]. Per Dr Irine Seal Urology-" will  need a consultation with a urologic oncologist to discuss chemo vs androgen receptor blockade or adrenal androgen synthesis inhibition as an addition to primary ADT. He will also need to be considered for a bone targeting agent such as Zometa or Xgeva. He will be returning to [place] and can arrange further evaluation and management there.   I have given him the name of a Urologist, Bluford Main MD at [facility], to contact as an option for f/u."   Leukocytosis : WBC trending up however patient is afebrile denies respiratory symptoms flank pain  or any urinary symptoms besides retention.  Back incision looks clean dry intact and received perioperative Ancef.  Suspect this is secondary to his IV Decadron.  Overall no major temperature spike.  Leukocytosis downtrending.  He will follow-up with PCP repeat CBC in a week.  Recent Labs  Lab 02/19/19 0712 02/21/19 0539 02/22/19 0447 02/23/19 0434  WBC 5.9 14.8* 20.4* 14.7*    Urine retention : Now Foley is placed.  He will continue with Foley upon discharge.  He will follow-up with urology and urology name/number at [facility] has been provided by the urologist here  AKI: Unclear baseline creatinine, 1.7 at the time of admission. Renal ultrasound shows bilateral  hydronephrosis, distended bladder. Foley catheter placed and having UOP well. suspect multifactorial in the setting of obstructive etiology from prostate cancer.  Seen by urology. creatinine improved  to 1.34.  Follow-up outpatient Recent Labs  Lab 02/19/19 0721 02/20/19 0657 02/21/19 0539 02/22/19 0447 02/23/19 0434  BUN 21 20 22  29* 19  CREATININE 1.70* 1.57* 1.52* 1.54* 1.34*    Anemia: Likely due to underlying malignancy, normocytic: Hemoglobin is stable. Recent Labs  Lab 02/19/19 0712 02/19/19 0721 02/21/19 0539 02/22/19 0447 02/23/19 0434  HGB 11.7* 12.9* 12.1* 10.7* 10.7*  HCT 37.0* 38.0* 37.6* 32.7* 32.7*   Hyperglycemia, A1c 5.2. REACTIVE  Hypertension: Elevated BP readings on admit, patient reports no prior history of hypertension. BP stable now not on meds.    Code Status: Full CODE STATUS DVT Prophylaxis:  SCD's Family Communication/Dispo: Discussed plan of care with the patient.  He is being discharged home by neurosurgery today and he will follow-up with urolology in [place].  Advised him to repeat CBC BMP in 5 to 7 days.   Procedures:  C4, C5, C6 laminectomy and resection of epidural metastatic tumor, microdiscectomy  Consultants:   Neurosurgery Urology, Dr. Jeffie Pollock  Antimicrobials:   Anti-infectives (From admission, onward)   Start     Dose/Rate Route Frequency Ordered Stop   02/20/19 1845  ceFAZolin (ANCEF) IVPB 1 g/50 mL premix     1 g 100 mL/hr over 30 Minutes Intravenous Every 8 hours 02/20/19 1844 02/21/19 1044   02/20/19 1515  bacitracin 50,000 Units in sodium chloride 0.9 % 500 mL irrigation  Status:  Discontinued       As needed 02/20/19 1515 02/20/19 1623   02/20/19 1300  ceFAZolin (ANCEF) IVPB 2g/100 mL premix     2 g 200 mL/hr over 30 Minutes Intravenous 30 min pre-op 02/20/19 1247 02/20/19 1452         Medications  Scheduled Meds:  Chlorhexidine Gluconate Cloth  6 each Topical Q0600   chlorproMAZINE (THORAZINE) injection  25 mg  Intramuscular Once   methylPREDNISolone  4 mg Oral 4X daily taper   mupirocin ointment  1 application Nasal BID   sodium chloride flush  3 mL Intravenous Q12H   tamsulosin  0.4 mg Oral Daily   Continuous Infusions:  sodium chloride 75 mL/hr at 02/22/19 0714   sodium chloride     lactated ringers     PRN Meds:.acetaminophen **OR** acetaminophen, bisacodyl, diazepam, hydrALAZINE, HYDROcodone-acetaminophen, HYDROmorphone (DILAUDID) injection, menthol-cetylpyridinium **OR** phenol, ondansetron **OR** ondansetron (ZOFRAN) IV, oxyCODONE, polyethylene glycol, sodium chloride flush, sodium phosphate    Subjective:  Resting comfortably in the bedside chair.  Has a Foley catheter placed and having good urine output.  No fever nausea vomiting.  No pain or drainage from the back. Objective:   Vitals:   02/22/19 1931 02/22/19 2311 02/23/19  0305 02/23/19 0753  BP: (!) 143/85 138/86 138/79 (!) 157/87  Pulse: 86 79 86 79  Resp: 18 17 17 12   Temp: 100 F (37.8 C) 99.2 F (37.3 C) 99.1 F (37.3 C) 98.1 F (36.7 C)  TempSrc: Oral Oral Oral Oral  SpO2: 100% 100% 100% 100%  Weight:      Height:        Intake/Output Summary (Last 24 hours) at 02/23/2019 1023 Last data filed at 02/22/2019 2300 Gross per 24 hour  Intake 960 ml  Output 1725 ml  Net -765 ml     Wt Readings from Last 3 Encounters:  02/19/19 73.9 kg    Physical Exam General exam: Calm, comfortable, NAD HEENT:Oral mucosa moist, Ear/Nose WNL grossly, dentition normal. Respiratory system: Bilateral equal air entry, no crackles and wheezing, no use of accessory muscle, non tender on palpation. Cardiovascular system: regular rate and rhythm, S1 & S2 heard, No JVD/murmurs. Gastrointestinal system: Suprapubic distention +.  abdomen soft, non-tender, non-distended, BS +. No hepatosplenomegaly palpable. Nervous System:Alert, awake and oriented at baseline. Able to move UE and LE, sensation intact. Extremities: No edema, distal  peripheral pulses palpable.  Skin: No rashes,no icterus. MSK: Normal muscle bulk,tone, power Posterior neck incision is clean dry intact  Data Reviewed:  I have personally reviewed following labs and imaging studies  Micro Results Recent Results (from the past 240 hour(s))  SARS Coronavirus 2 (CEPHEID - Performed in [place] hospital lab), Hosp Order     Status: None   Collection Time: 02/19/19  4:21 PM   Specimen: Nasopharyngeal Swab  Result Value Ref Range Status   SARS Coronavirus 2 NEGATIVE NEGATIVE Final    Comment: (NOTE) If result is NEGATIVE SARS-CoV-2 target nucleic acids are NOT DETECTED. The SARS-CoV-2 RNA is generally detectable in upper and lower  respiratory specimens during the acute phase of infection. The lowest  concentration of SARS-CoV-2 viral copies this assay can detect is 250  copies / mL. A negative result does not preclude SARS-CoV-2 infection  and should not be used as the sole basis for treatment or other  patient management decisions.  A negative result may occur with  improper specimen collection / handling, submission of specimen other  than nasopharyngeal swab, presence of viral mutation(s) within the  areas targeted by this assay, and inadequate number of viral copies  (<250 copies / mL). A negative result must be combined with clinical  observations, patient history, and epidemiological information. If result is POSITIVE SARS-CoV-2 target nucleic acids are DETECTED. The SARS-CoV-2 RNA is generally detectable in upper and lower  respiratory specimens dur ing the acute phase of infection.  Positive  results are indicative of active infection with SARS-CoV-2.  Clinical  correlation with patient history and other diagnostic information is  necessary to determine patient infection status.  Positive results do  not rule out bacterial infection or co-infection with other viruses. If result is PRESUMPTIVE POSTIVE SARS-CoV-2 nucleic acids MAY BE  PRESENT.   A presumptive positive result was obtained on the submitted specimen  and confirmed on repeat testing.  While 2019 novel coronavirus  (SARS-CoV-2) nucleic acids may be present in the submitted sample  additional confirmatory testing may be necessary for epidemiological  and / or clinical management purposes  to differentiate between  SARS-CoV-2 and other Sarbecovirus currently known to infect humans.  If clinically indicated additional testing with an alternate test  methodology [phone]) is advised. The SARS-CoV-2 RNA is generally  detectable in upper and  lower respiratory sp ecimens during the acute  phase of infection. The expected result is Negative. Fact Sheet for Patients:  StrictlyIdeas.no Fact Sheet for Healthcare Providers: BankingDealers.co.za This test is not yet approved or cleared by the [place] FDA and has been authorized for detection and/or diagnosis of SARS-CoV-2 by FDA under an Emergency Use Authorization (EUA).  This EUA will remain in effect (meaning this test can be used) for the duration of the COVID-19 declaration under Section 564(b)(1) of the Act, 21 U.S.C. section 360bbb-3(b)(1), unless the authorization is terminated or revoked sooner. Performed at [facility], [address], [place], [place] [address]   Surgical pcr screen     Status: Abnormal   Collection Time: 02/20/19  1:06 PM   Specimen: Nasal Mucosa; Nasal Swab  Result Value Ref Range Status   MRSA, PCR POSITIVE (A) NEGATIVE Final    Comment: RESULT CALLED TO, READ BACK BY AND VERIFIED WITH: Cleotis Nipper RN 14:45 02/20/19 ([email or web address])    Staphylococcus aureus POSITIVE (A) NEGATIVE Final    Comment: (NOTE) The Xpert SA Assay (FDA approved for NASAL specimens in patients 89 years of age and older), is one component of a comprehensive surveillance program. It is not intended to diagnose infection nor to guide or monitor  treatment. Performed at [facility], [place] [address], [place], [place] [address]     Radiology Reports Ct Abdomen Pelvis Wo Contrast  Result Date: 02/19/2019 CLINICAL DATA:  Sclerotic lesions in cervical spine. Evaluate for multiple myeloma or solid organ malignancy. EXAM: CT ABDOMEN AND PELVIS WITHOUT CONTRAST TECHNIQUE: Multidetector CT imaging of the abdomen and pelvis was performed following the standard protocol without IV contrast. COMPARISON:  None. FINDINGS: Lower chest: 7 mm subpleural pulmonary nodule left lower lobe, image 6 series 4. No acute airspace disease. No pleural fluid. No focal airspace disease. No pleural fluid. Hepatobiliary: Evaluation for focal lesion is limited in the absence of IV contrast. Allowing for this, no focal lesion is visualized. Multiple calcified granuloma. Gallbladder physiologically distended, no calcified stone. No biliary dilatation. Pancreas: Evaluation is limited by absence of IV contrast and paucity of intra-abdominal fat. No evidence of focal pancreatic abnormality. No ductal dilatation or inflammation. Spleen: Spleen is normal to small in size spanning 8.1 cm. Multiple calcified granuloma. Adrenals/Urinary Tract: No evidence of adrenal nodule, adrenal glands poorly visualized. Bilateral hydroureteronephrosis. Ureters are dilated to the bladder insertion. No obvious renal mass on noncontrast exam. The urinary bladder is distended measuring 11.2 x 9.8 x 15.1 cm (volume = 870 cm^3). Mild bladder wall thickening. No obvious bladder mass on noncontrast exam. Stomach/Bowel: Administered enteric contrast reaches the mid distal small bowel. No obstruction. Stomach physiologically distended without obvious gastric mass. No bowel wall thickening or inflammatory change. Normal appendix. Moderate stool burden throughout the colon. No obvious colonic mass. No colonic wall thickening. Vascular/Lymphatic: Suspected retroperitoneal adenopathy extending from the level of  the left renal vein to the iliac bifurcation. Largest node in the left periaortic station measuring 19 mm short axis, image 28 series 3. Lack of IV contrast and paucity of intra-abdominal fat limits assessment. There is left internal and external iliac adenopathy, largest node in the left internal iliac chain measuring 2.3 cm, image 64. Right external iliac/pelvic sidewall adenopathy at 2.2 cm, slightly increased nodal density. Small bilateral inguinal nodes. Reproductive: Prostate gland spans 4.4 x 4.9 cm. Soft tissue prominence of the right seminal vesicle. Other: Minimal fat in the left inguinal canal. No ascites or  free air. Musculoskeletal: Sclerotic lesions throughout the included thoracic and lumbar spine. Lesions in every lumbar vertebral body, largest involving L5. Sclerotic lesion and right posterior tenth rib. Multiple sclerotic lesions in the pelvis. IMPRESSION: 1. Findings suspicious for primary prostate malignancy. Bilateral pelvic and retroperitoneal adenopathy. Multifocal sclerotic osseous lesions. Prominent prostate gland with distended urinary bladder and bilateral hydronephrosis. 2. A 7 mm left lower lobe pulmonary nodule is nonspecific, but metastatic disease is considered. 3. Sequela of prior granulomatous disease with splenic and hepatic calcifications. Electronically Signed   By: Keith Rake M.D.   On: 02/19/2019 22:53   Dg Chest 2 View  Result Date: 02/19/2019 CLINICAL DATA:  Chest pain. EXAM: CHEST - 2 VIEW COMPARISON:  None. FINDINGS: The cardiac silhouette, mediastinal and hilar contours are within normal limits. The lungs are clear of acute process. No infiltrates, edema or effusions. Rounded density in the left lower chest or could reflect a nipple shadow. A follow-up PA chest film with nipple markers may be helpful to exclude a pulmonary nodule. The bony thorax is intact. IMPRESSION: 1. No acute cardiopulmonary findings. 2. Nodular density in the left lower lobe could reflect a  nipple shadow or overlapping bony and vascular markings. A repeat PA chest film with nipple markers may be helpful when able, for further evaluation. Electronically Signed   By: Marijo Sanes M.D.   On: 02/19/2019 14:45   Dg Cervical Spine 1 View  Result Date: 02/20/2019 CLINICAL DATA:  Epidural tumor. EXAM: DG CERVICAL SPINE - 1 VIEW COMPARISON:  Cervical MRI dated 02/20/2019 FINDINGS: Lateral view of the cervical spine demonstrates an instrument at the C4-5 level posteriorly. This is the level of the previously demonstrated epidural tumor. Note is made of abnormal sclerosis and patchy lucency in the vertebra at C4, C5, C6, and C7 consistent with the tumor demonstrated on the prior exam. IMPRESSION: Localization instrument at C4-5. Electronically Signed   By: Lorriane Shire M.D.   On: 02/20/2019 16:51   Ct Head Wo Contrast  Result Date: 02/19/2019 CLINICAL DATA:  63 year old and possible stroke. EXAM: CT HEAD WITHOUT CONTRAST TECHNIQUE: Contiguous axial images were obtained from the base of the skull through the vertex without intravenous contrast. COMPARISON:  06/18/2014 FINDINGS: Brain: No evidence of acute infarction, hemorrhage, hydrocephalus, extra-axial collection or mass lesion/mass effect. Vascular: No hyperdense vessel or unexpected calcification. Skull: Normal. Negative for fracture or focal lesion. Sinuses/Orbits: Mucosal disease in the right maxillary sinus which is partially imaged. Other: None IMPRESSION: No acute intracranial abnormality. Right maxillary sinus disease. Electronically Signed   By: Markus Daft M.D.   On: 02/19/2019 08:24   Ct Cervical Spine Wo Contrast  Result Date: 02/19/2019 CLINICAL DATA:  C-spine trauma. Myelopathy.  Abnormal MRI. EXAM: CT CERVICAL SPINE WITHOUT CONTRAST TECHNIQUE: Multidetector CT imaging of the cervical spine was performed without intravenous contrast. Multiplanar CT image reconstructions were also generated. COMPARISON:  MRI cervical spine 02/19/2019  FINDINGS: Alignment: Reversal of the normal cervical lordotic curve. Skull base and vertebrae: Abnormal sclerosis of the cervical vertebrae, C4 through C7, and the visualized thoracic vertebrae, T1 and T2, also involving the facets and other posterior elements, slight remodeling with loss of height particularly at C5 and C6. Concern for metastatic disease. Soft tissues and spinal canal: The spinal canal is poorly visualized on this exam. However, correlating with MRI, and given the appearance of the vertebral bodies, posterior epidural tumor at C5 is a differential concern. Disc levels: Near complete loss of interspace height from C4  through C7-T1. This results in varying degrees of foraminal narrowing. Upper chest: Negative. Other: None. IMPRESSION: 1. Abnormal sclerosis of the cervical vertebrae, and visualized upper thoracic vertebrae, with slight remodeling and loss of height particularly at C5 and C6. Concern for metastatic disease, specifically prostate cancer in a male. Less common considerations would include transitional cell carcinoma, mucinous adenocarcinomas, or lymphoma. 2. The spinal canal is poorly visualized on this exam, but given the appearance of the vertebral bodies, posterior epidural tumor at C5 is a differential consideration, along with hemorrhage or infection. 3. Consider MRI with contrast for further evaluation, to evaluate the posterior epidural space. As the noncontrast study was considerably degraded, consider premedication with pain medication or anxiolytics. Electronically Signed   By: Staci Righter M.D.   On: 02/19/2019 15:25   Mr Brain Wo Contrast (neuro Protocol)  Result Date: 02/19/2019 CLINICAL DATA:  Focal neuro deficit greater than 6 hours. Suspect stroke. EXAM: MRI HEAD WITHOUT CONTRAST TECHNIQUE: Multiplanar, multiecho pulse sequences of the brain and surrounding structures were obtained without intravenous contrast. COMPARISON:  CT head 02/19/2019 FINDINGS: Brain: Image  quality degraded by motion. Negative for acute or chronic infarction. Ventricle size normal. Negative for mass or edema. Negative for hemorrhage or fluid collection. Vascular: Normal arterial flow voids Skull and upper cervical spine: Normal skull. Abnormal bone marrow throughout the C4 vertebral body. C2 and C3 vertebral bodies normal. Sinuses/Orbits: Mucosal edema paranasal sinuses.  Normal orbit Other: None IMPRESSION: Image quality degraded by motion Negative MRI of the brain without contrast Diffusely abnormal bone marrow at C4.  Possible tumor involvement. Electronically Signed   By: Franchot Gallo M.D.   On: 02/19/2019 11:06   Mr Cervical Spine Wo Contrast  Addendum Date: 02/19/2019   ADDENDUM REPORT: 02/19/2019 13:07 ADDENDUM: After further review and discussion with Dr. Trenton Gammon, there appears to be a posterior epidural process at the C5 bilaterally. This appears to be contributing to spinal stenosis. This has increased signal on T1 and decreased signal intensity on T2. Possible hemorrhage or abscess. MRI cervical spine without with contrast preferably with sedation would be helpful for further evaluation. Also consider CT cervical spine. Electronically Signed   By: Franchot Gallo M.D.   On: 02/19/2019 13:07   Result Date: 02/19/2019 CLINICAL DATA:  Neck pain. Radiculopathy with arm pain and weakness. EXAM: MRI CERVICAL SPINE WITHOUT CONTRAST TECHNIQUE: Multiplanar, multisequence MR imaging of the cervical spine was performed. No intravenous contrast was administered. COMPARISON:  None. FINDINGS: Alignment: Image quality degraded by significant motion. Normal alignment.  Cervical kyphosis. Vertebrae: Negative for fracture. Diffuse bone marrow abnormality in the cervical spine. The vertebral bodies show low signal on T1 and T2 from C4 through T2 with similar changes in the posterior elements. There is also similar change in the posterior elements but not vertebral body of C3. Cord: Cord evaluation limited  by motion however there is possible cord edema throughout the cervical spine from approximately C4 through C6. Posterior Fossa, vertebral arteries, paraspinal tissues: Negative Disc levels: C2-3: Negative C3-4: Mild spinal stenosis. Disc degeneration and diffuse uncinate spurring. Moderate foraminal narrowing bilaterally C4-5: Moderate spinal stenosis. Disc degeneration and spurring. Moderate foraminal stenosis bilaterally C5-6: Disc degeneration and spondylosis. Moderate left foraminal stenosis and mild-to-moderate spinal stenosis C6-7: Disc degeneration and spondylosis. Mild spinal stenosis and mild foraminal stenosis bilaterally C7-T1: Moderate disc degeneration and spurring. Moderate foraminal stenosis bilaterally. Mild spinal stenosis. IMPRESSION: 1. Image quality degraded by significant motion. 2. Extensive disc degeneration and spondylosis throughout the cervical  spine causing spinal and foraminal stenosis at multiple levels. 3. Cord evaluation limited due to motion however there is suggestion of cord edema throughout much of the cervical spine from C4 through C6. This could be due to myelopathy 4. Diffusely abnormal bone marrow throughout the vertebral bodies and posterior elements from C4-T2. While this pattern could be seen due to advanced disc degeneration, further evaluation for infiltrating neoplastic process such is lymphoma or metastatic disease is warranted. 5. Given the motion degraded study, consider follow-up CT cervical spine. Also, would suggest a follow-up MRI of the cervical spine without with contrast, possibly with sedation, in the near future when the patient is able to hold still. 6. These results were called by telephone at the time of interpretation on 02/19/2019 at 11:20 am to Dr. Charlesetta Shanks , who verbally acknowledged these results. Electronically Signed: By: Franchot Gallo M.D. On: 02/19/2019 11:22   Mr Cervical Spine W Contrast  Result Date: 02/20/2019 CLINICAL DATA:   Myelopathy symptoms. EXAM: MRI CERVICAL SPINE WITH CONTRAST TECHNIQUE: Multiplanar, multisequence MR imaging of the cervical spine was performed following the administration of intravenous contrast. COMPARISON:  Noncontrast MRI the cervical spine 02/19/2019. MRI thoracic and lumbar spine described separately. FINDINGS: Alignment: Straightening of the normal cervical lordosis. Vertebrae: Diffusely abnormal C4 through C7 cervical vertebrae, including posterior elements consistent with metastatic disease. Cord: Significant cord compression maximal at C5, primarily due to bulky epidural tumor. There is a focal deposit in the midline, eccentric to the LEFT, cross-section 8 x 9 x 12 mm. The mass demonstrates T1 shortening precontrast, along with moderate enhancement after administration of gadolinium. Lateral extension of tumor inside the lamina toward the LEFT foramen. Significant narrowing the spinal canal, as narrow as 4 mm. Abnormal dural enhancement, primarily dorsal extends above and below the focal deposit. Posterior Fossa, vertebral arteries, paraspinal tissues: Unremarkable posterior fossa. Disc levels: Unchanged from yesterday's CT. IMPRESSION: Widespread osseous metastatic disease C4 through C7. Additional metastatic disease described separately. Enhancing epidural tumor maximal at the C5 vertebral segment dorsally, eccentric to the LEFT. Significant spinal stenosis and cord compression. 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 Precontrast T1 shortening raises the question of coexistent recent hemorrhage. Electronically Signed   By: Staci Righter M.D.   On: 02/20/2019 12:47   Mr Thoracic Spine W Wo Contrast  Result Date: 02/20/2019 CLINICAL DATA:  Continued surveillance of suspected metastatic disease. Myelopathy. Neck pain. EXAM: MRI THORACIC WITHOUT AND WITH CONTRAST TECHNIQUE: Multiplanar and multiecho pulse sequences of the thoracic spine were obtained without and  with intravenous contrast. CONTRAST:  Gadavist 7 mL. COMPARISON:  MRI cervical spine and lumbar spine reported separately. FINDINGS: MRI THORACIC SPINE FINDINGS Alignment:  Physiologic. Vertebrae: Widespread metastatic disease throughout all visualized thoracic vertebrae, with the greatest involvement in T1, T2, T3, T4, T7, and T11. No pathologic compression fracture. Cord:  Normal signal and morphology.  No epidural tumor. Paraspinal and other soft tissues: No significant paravertebral tumor. Disc levels: No compressive disc protrusion or spinal stenosis. IMPRESSION: Widespread metastatic disease throughout all visualized thoracic vertebrae. No pathologic compression fracture. No epidural tumor. Electronically Signed   By: Staci Righter M.D.   On: 02/20/2019 12:54   Mr Lumbar Spine W Wo Contrast  Result Date: 02/20/2019 CLINICAL DATA:  Continued surveillance suspected prostate cancer with metastatic disease. EXAM: MRI LUMBAR SPINE WITHOUT AND WITH CONTRAST TECHNIQUE: Multiplanar and multiecho pulse sequences of the lumbar spine were obtained without and with intravenous contrast. CONTRAST:  ERDEYCXK  7 mL. COMPARISON:  MRI cervical and thoracic spine reported separately. Correlate with CT abdomen and pelvis from 02/19/2019. FINDINGS: Segmentation:  Standard. Alignment:  Anatomic Vertebrae: Widespread metastatic disease throughout all visualized lumbar vertebrae. No pathologic fracture. Abnormal foci of signal abnormality representing metastatic disease involve the sacrum and both iliac bones. Conus medullaris and cauda equina: Conus extends to the L1 level. Conus and cauda equina appear normal. No epidural tumor. Paraspinal and other soft tissues: Marked bladder distension due to outlet obstruction, prostatomegaly. BILATERAL hydronephrosis. Retroperitoneal adenopathy. Disc levels: No compressive disc protrusion or spinal stenosis. IMPRESSION: Widespread metastatic disease throughout all visualized lumbar  vertebrae. No pathologic fracture. No epidural tumor. Prostatomegaly, BILATERAL hydronephrosis, and retroperitoneal adenopathy, sequelae of prostate cancer with regional metastases. Electronically Signed   By: Staci Righter M.D.   On: 02/20/2019 13:02   US Renal  Result Date: 02/20/2019 CLINICAL DATA:  63 year old male with hydronephrosis. EXAM: RENAL / URINARY TRACT ULTRASOUND COMPLETE COMPARISON:  CT Abdomen and Pelvis 02/19/2019. FINDINGS: Right Kidney: Renal measurements: 11.6 x 4.9 x 5.9 centimeters = volume: 175 mL. Moderate hydronephrosis appears stable since yesterday. Right hydroureter measures 10 millimeters (image 17). The right kidney appears mildly echogenic on image 2. No right renal mass. Left Kidney: Renal measurements: 10.6 x 6.7 x 5.4 centimeters = volume: 202 mL. Moderate hydronephrosis is stable since yesterday. Left hydroureter partially visible on image 30. Left renal echogenicity appears similar to that on the right. No left renal mass. Bladder: Dilated with evidence of small bladder diverticula (image 36) and trabeculated appearance of the bladder. Estimated bladder volume 751 milliliters. Unchanged postvoid bladder volume. The right ureteral jet is detected with Doppler. There is a small 9-10 millimeter polypoid but nonvascular lesion located dependently within the bladder on images 42 and 43. Other findings: Irregular contour of the prostate which measures 7.3 x 4.8 x 4.8 centimeters (image 44). IMPRESSION: 1. Dilated urinary bladder (751 mL) with no postvoid emptying and stable moderate bilateral hydronephrosis and hydroureter since yesterday. 2. Small 10 mm bladder polyp or dependent tumefactive debris (image 42). Small bladder diverticula also noted. Electronically Signed   By: Genevie Ann M.D.   On: 02/20/2019 09:51    Lab Data:  CBC: Recent Labs  Lab 02/19/19 0712 02/19/19 0721 02/21/19 0539 02/22/19 0447 02/23/19 0434  WBC 5.9  --  14.8* 20.4* 14.7*  NEUTROABS 3.4  --    --   --   --   HGB 11.7* 12.9* 12.1* 10.7* 10.7*  HCT 37.0* 38.0* 37.6* 32.7* 32.7*  MCV 92.5  --  89.3 88.4 88.1  PLT 222  --  258 207 583   Basic Metabolic Panel: Recent Labs  Lab 02/19/19 0712 02/19/19 0721 02/20/19 0657 02/21/19 0539 02/22/19 0447 02/23/19 0434  NA 139 140 138 137 141 140  K 3.8 3.8 4.3 4.7 4.4 3.9  CL 106 106 106 107 108 107  CO2 24  --  22 22 25 26   GLUCOSE 174* 172* 152* 177* 113* 108*  BUN 19 21 20 22  29* 19  CREATININE 1.84* 1.70* 1.57* 1.52* 1.54* 1.34*  CALCIUM 8.7*  --  8.6* 8.3* 8.5* 8.3*   GFR: Estimated Creatinine Clearance: 59.7 mL/min (A) (by C-G formula based on SCr of 1.34 mg/dL (H)). Liver Function Tests: Recent Labs  Lab 02/19/19 0712  AST 30  ALT 23  ALKPHOS 455*  BILITOT 0.9  PROT 6.5  ALBUMIN 3.4*   No results for input(s): LIPASE, AMYLASE in the last 168 hours.  No results for input(s): AMMONIA in the last 168 hours. Coagulation Profile: Recent Labs  Lab 02/19/19 0712  INR 1.2   Cardiac Enzymes: No results for input(s): CKTOTAL, CKMB, CKMBINDEX, TROPONINI in the last 168 hours. BNP (last 3 results) No results for input(s): PROBNP in the last 8760 hours. HbA1C: Recent Labs    02/20/19 1238  HGBA1C 5.2   CBG: Recent Labs  Lab 02/19/19 1206  GLUCAP 76   Lipid Profile: No results for input(s): CHOL, HDL, LDLCALC, TRIG, CHOLHDL, LDLDIRECT in the last 72 hours. Thyroid Function Tests: No results for input(s): TSH, T4TOTAL, FREET4, T3FREE, THYROIDAB in the last 72 hours. Anemia Panel: Recent Labs    02/20/19 1238  VITAMINB12 595  FOLATE 12.2  FERRITIN 590*  TIBC 248*  IRON 60  RETICCTPCT 1.3   Urine analysis: No results found for: COLORURINE, APPEARANCEUR, LABSPEC, PHURINE, GLUCOSEU, HGBUR, BILIRUBINUR, KETONESUR, PROTEINUR, UROBILINOGEN, NITRITE, Amedeo Gory M.D. [facility] 02/23/2019, 10:23 AM  Pager: [phone] Between 7am to 7pm - call Pager - [phone]  After 7pm go to  [email or web address] - password [email or web address]  Call night coverage person covering after 7pm

## 2019-02-23 NOTE — Progress Notes (Signed)
3 Days Post-Op  Subjective: Chris Morales is doing well without complaints.   He had firmagon yesterday and reports minimal site discomfort.  He is tolerating the foley well.  Cr has continued to fall.  ROS:  Review of Systems  Constitutional: Negative for fever.  Gastrointestinal: Negative for abdominal pain.    Anti-infectives: Anti-infectives (From admission, onward)   Start     Dose/Rate Route Frequency Ordered Stop   02/20/19 1845  ceFAZolin (ANCEF) IVPB 1 g/50 mL premix     1 g 100 mL/hr over 30 Minutes Intravenous Every 8 hours 02/20/19 1844 02/21/19 1044   02/20/19 1515  bacitracin 50,000 Units in sodium chloride 0.9 % 500 mL irrigation  Status:  Discontinued       As needed 02/20/19 1515 02/20/19 1623   02/20/19 1300  ceFAZolin (ANCEF) IVPB 2g/100 mL premix     2 g 200 mL/hr over 30 Minutes Intravenous 30 min pre-op 02/20/19 1247 02/20/19 1452      Current Facility-Administered Medications  Medication Dose Route Frequency Provider Last Rate Last Dose  . 0.9 %  sodium chloride infusion   Intravenous Continuous Earnie Larsson, MD 75 mL/hr at 02/22/19 0714    . 0.9 %  sodium chloride infusion  250 mL Intravenous Continuous Earnie Larsson, MD      . acetaminophen (TYLENOL) tablet 650 mg  650 mg Oral Q6H PRN Earnie Larsson, MD       Or  . acetaminophen (TYLENOL) suppository 650 mg  650 mg Rectal Q6H PRN Earnie Larsson, MD      . bisacodyl (DULCOLAX) suppository 10 mg  10 mg Rectal Daily PRN Earnie Larsson, MD      . Chlorhexidine Gluconate Cloth 2 % PADS 6 each  6 each Topical [id number] Irine Seal, MD   6 each at 02/23/19 0617  . chlorproMAZINE (THORAZINE) injection 25 mg  25 mg Intramuscular Once Gardiner Barefoot, NP   Stopped at 02/23/19 0056  . diazepam (VALIUM) tablet 5-10 mg  5-10 mg Oral Q6H PRN Earnie Larsson, MD      . hydrALAZINE (APRESOLINE) injection 10 mg  10 mg Intravenous Q6H PRN Earnie Larsson, MD   10 mg at 02/20/19 1637  . HYDROcodone-acetaminophen (NORCO/VICODIN) 5-325 MG per tablet 1-2  tablet  1-2 tablet Oral Q4H PRN Earnie Larsson, MD   2 tablet at 02/20/19 1654  . HYDROmorphone (DILAUDID) injection 1 mg  1 mg Intravenous Q2H PRN Earnie Larsson, MD      . lactated ringers infusion   Intravenous Continuous Earnie Larsson, MD      . menthol-cetylpyridinium (CEPACOL) lozenge 3 mg  1 lozenge Oral PRN Earnie Larsson, MD       Or  . phenol (CHLORASEPTIC) mouth spray 1 spray  1 spray Mouth/Throat PRN Earnie Larsson, MD      . methylPREDNISolone (MEDROL DOSEPAK) tablet 4 mg  4 mg Oral 4X daily taper Earnie Larsson, MD      . mupirocin ointment (BACTROBAN) 2 % 1 application  1 application Nasal BID Rai, Vernelle Emerald, MD   1 application at 33/29/51 2305  . ondansetron (ZOFRAN) tablet 4 mg  4 mg Oral Q6H PRN Earnie Larsson, MD       Or  . ondansetron [facility]) injection 4 mg  4 mg Intravenous Q6H PRN Earnie Larsson, MD      . oxyCODONE (Oxy IR/ROXICODONE) immediate release tablet 10 mg  10 mg Oral Q3H PRN Earnie Larsson, MD      . polyethylene glycol (  MIRALAX / GLYCOLAX) packet 17 g  17 g Oral Daily PRN Earnie Larsson, MD      . sodium chloride flush (NS) 0.9 % injection 3 mL  3 mL Intravenous Q12H Earnie Larsson, MD   3 mL at 02/22/19 2313  . sodium chloride flush (NS) 0.9 % injection 3 mL  3 mL Intravenous PRN Earnie Larsson, MD      . sodium phosphate (FLEET) 7-19 GM/118ML enema 1 enema  1 enema Rectal Once PRN Earnie Larsson, MD      . tamsulosin [facility]) capsule 0.4 mg  0.4 mg Oral Daily Earnie Larsson, MD   0.4 mg at 02/22/19 0841     Objective: Vital signs in last 24 hours: Temp:  [99.1 F (37.3 C)-100 F (37.8 C)] 99.1 F (37.3 C) (06/25 0305) Pulse Rate:  [79-86] 86 (06/25 0305) Resp:  [17-18] 17 (06/25 0305) BP: (138-158)/(79-86) 138/79 (06/25 0305) SpO2:  [100 %] 100 % (06/25 0305)  Intake/Output from previous day: 06/24 0701 - 06/25 0700 In: 960 [P.O.:960] Out: 1975 [Urine:1900; Drains:75] Intake/Output this shift: No intake/output data recorded.   Physical Exam Vitals signs reviewed.   Constitutional:      Appearance: Normal appearance.  Neurological:     Mental Status: He is alert.     Lab Results:  Recent Labs    02/22/19 0447 02/23/19 0434  WBC 20.4* 14.7*  HGB 10.7* 10.7*  HCT 32.7* 32.7*  PLT 207 192   BMET Recent Labs    02/22/19 0447 02/23/19 0434  NA 141 140  K 4.4 3.9  CL 108 107  CO2 25 26  GLUCOSE 113* 108*  BUN 29* 19  CREATININE 1.54* 1.34*  CALCIUM 8.5* 8.3*   PT/INR No results for input(s): LABPROT, INR in the last 72 hours. ABG No results for input(s): PHART, HCO3 in the last 72 hours.  Invalid input(s): PCO2, PO2  Studies/Results: No results found.   Assessment and Plan: Widely metastatic prostate cancer with cervical spine compression now improving post decompression.  Urinary retention with hydro and AKI improving after foley placement.  He will need to keep the catheter until he is seen by urology in [place].   He will  need a consultation with a urologic oncologist to discuss chemo vs androgen receptor blockade or adrenal androgen synthesis inhibition as an addition to primary ADT.   He will also need to be considered for a bone targeting agent such as Zometa or Xgeva.   He will be returning to [place] and can arrange further evaluation and management there.   I have given him the name of a Urologist, Bluford Main MD at [facility], to contact as an option for f/u.        LOS: 4 days    Irine Seal 02/23/2019 [phone]

## 2019-02-23 NOTE — Progress Notes (Signed)
Pt d/c home to self care. Pt is alert and oriented with no New concerns. Pt is on room air. D/c instructions done with teach back, pt verbalize understanding. Pt will be transported out if facility by a friend

## 2019-02-23 NOTE — Plan of Care (Signed)
Pt met goals of care on this admission

## 2019-02-28 ENCOUNTER — Ambulatory Visit: Payer: MEDICAID | Admitting: Radiation Oncology

## 2019-02-28 ENCOUNTER — Institutional Professional Consult (permissible substitution): Payer: Self-pay | Admitting: Urology

## 2020-06-24 ENCOUNTER — Inpatient Hospital Stay (HOSPITAL_COMMUNITY)
Admission: EM | Admit: 2020-06-24 | Discharge: 2020-07-05 | DRG: 291 | Disposition: A | Discharge status: Rehabilitation Facility | Payer: PRIVATE HEALTH INSURANCE | Attending: Internal Medicine | Admitting: Internal Medicine

## 2020-06-24 ENCOUNTER — Emergency Department (HOSPITAL_COMMUNITY): Payer: PRIVATE HEALTH INSURANCE

## 2020-06-24 ENCOUNTER — Encounter (HOSPITAL_COMMUNITY): Payer: Self-pay | Admitting: Emergency Medicine

## 2020-06-24 ENCOUNTER — Other Ambulatory Visit: Payer: Self-pay

## 2020-06-24 DIAGNOSIS — I5033 Acute on chronic diastolic (congestive) heart failure: Secondary | ICD-10-CM | POA: Diagnosis not present

## 2020-06-24 DIAGNOSIS — R64 Cachexia: Secondary | ICD-10-CM | POA: Diagnosis present

## 2020-06-24 DIAGNOSIS — D630 Anemia in neoplastic disease: Secondary | ICD-10-CM | POA: Diagnosis present

## 2020-06-24 DIAGNOSIS — I4891 Unspecified atrial fibrillation: Secondary | ICD-10-CM | POA: Diagnosis present

## 2020-06-24 DIAGNOSIS — I4821 Permanent atrial fibrillation: Secondary | ICD-10-CM | POA: Diagnosis present

## 2020-06-24 DIAGNOSIS — M4802 Spinal stenosis, cervical region: Secondary | ICD-10-CM | POA: Diagnosis present

## 2020-06-24 DIAGNOSIS — R911 Solitary pulmonary nodule: Secondary | ICD-10-CM | POA: Diagnosis present

## 2020-06-24 DIAGNOSIS — K761 Chronic passive congestion of liver: Secondary | ICD-10-CM | POA: Diagnosis present

## 2020-06-24 DIAGNOSIS — J918 Pleural effusion in other conditions classified elsewhere: Secondary | ICD-10-CM | POA: Diagnosis present

## 2020-06-24 DIAGNOSIS — M62522 Muscle wasting and atrophy, not elsewhere classified, left upper arm: Secondary | ICD-10-CM | POA: Diagnosis present

## 2020-06-24 DIAGNOSIS — Z681 Body mass index (BMI) 19 or less, adult: Secondary | ICD-10-CM

## 2020-06-24 DIAGNOSIS — I482 Chronic atrial fibrillation, unspecified: Secondary | ICD-10-CM | POA: Diagnosis not present

## 2020-06-24 DIAGNOSIS — Z515 Encounter for palliative care: Secondary | ICD-10-CM

## 2020-06-24 DIAGNOSIS — R0602 Shortness of breath: Secondary | ICD-10-CM

## 2020-06-24 DIAGNOSIS — Z7901 Long term (current) use of anticoagulants: Secondary | ICD-10-CM

## 2020-06-24 DIAGNOSIS — Z79899 Other long term (current) drug therapy: Secondary | ICD-10-CM

## 2020-06-24 DIAGNOSIS — D649 Anemia, unspecified: Secondary | ICD-10-CM | POA: Diagnosis present

## 2020-06-24 DIAGNOSIS — N138 Other obstructive and reflux uropathy: Secondary | ICD-10-CM | POA: Diagnosis present

## 2020-06-24 DIAGNOSIS — M6281 Muscle weakness (generalized): Secondary | ICD-10-CM | POA: Diagnosis present

## 2020-06-24 DIAGNOSIS — C7932 Secondary malignant neoplasm of cerebral meninges: Secondary | ICD-10-CM | POA: Diagnosis present

## 2020-06-24 DIAGNOSIS — Z79890 Hormone replacement therapy (postmenopausal): Secondary | ICD-10-CM

## 2020-06-24 DIAGNOSIS — R531 Weakness: Secondary | ICD-10-CM

## 2020-06-24 DIAGNOSIS — Z789 Other specified health status: Secondary | ICD-10-CM

## 2020-06-24 DIAGNOSIS — Z9889 Other specified postprocedural states: Secondary | ICD-10-CM

## 2020-06-24 DIAGNOSIS — J90 Pleural effusion, not elsewhere classified: Secondary | ICD-10-CM | POA: Diagnosis present

## 2020-06-24 DIAGNOSIS — Z8042 Family history of malignant neoplasm of prostate: Secondary | ICD-10-CM

## 2020-06-24 DIAGNOSIS — Z7189 Other specified counseling: Secondary | ICD-10-CM

## 2020-06-24 DIAGNOSIS — C61 Malignant neoplasm of prostate: Secondary | ICD-10-CM | POA: Diagnosis present

## 2020-06-24 DIAGNOSIS — R932 Abnormal findings on diagnostic imaging of liver and biliary tract: Secondary | ICD-10-CM | POA: Diagnosis present

## 2020-06-24 DIAGNOSIS — E039 Hypothyroidism, unspecified: Secondary | ICD-10-CM | POA: Diagnosis present

## 2020-06-24 DIAGNOSIS — I820 Budd-Chiari syndrome: Secondary | ICD-10-CM

## 2020-06-24 DIAGNOSIS — D329 Benign neoplasm of meninges, unspecified: Secondary | ICD-10-CM | POA: Diagnosis present

## 2020-06-24 DIAGNOSIS — R7989 Other specified abnormal findings of blood chemistry: Secondary | ICD-10-CM | POA: Diagnosis present

## 2020-06-24 DIAGNOSIS — Z20822 Contact with and (suspected) exposure to covid-19: Secondary | ICD-10-CM | POA: Diagnosis present

## 2020-06-24 DIAGNOSIS — E0781 Sick-euthyroid syndrome: Secondary | ICD-10-CM | POA: Diagnosis present

## 2020-06-24 DIAGNOSIS — Z803 Family history of malignant neoplasm of breast: Secondary | ICD-10-CM

## 2020-06-24 DIAGNOSIS — I959 Hypotension, unspecified: Secondary | ICD-10-CM | POA: Diagnosis present

## 2020-06-24 DIAGNOSIS — J9601 Acute respiratory failure with hypoxia: Secondary | ICD-10-CM | POA: Diagnosis present

## 2020-06-24 DIAGNOSIS — C7951 Secondary malignant neoplasm of bone: Secondary | ICD-10-CM

## 2020-06-24 DIAGNOSIS — Z978 Presence of other specified devices: Secondary | ICD-10-CM

## 2020-06-24 LAB — COMPREHENSIVE METABOLIC PANEL
ALT: 29 U/L (ref 0–44)
AST: 49 U/L — ABNORMAL HIGH (ref 15–41)
Albumin: 1.9 g/dL — ABNORMAL LOW (ref 3.5–5.0)
Alkaline Phosphatase: 2755 U/L — ABNORMAL HIGH (ref 38–126)
Anion gap: 8 (ref 5–15)
BUN: 9 mg/dL (ref 8–23)
CO2: 25 mmol/L (ref 22–32)
Calcium: 7.5 mg/dL — ABNORMAL LOW (ref 8.9–10.3)
Chloride: 107 mmol/L (ref 98–111)
Creatinine, Ser: 0.61 mg/dL (ref 0.61–1.24)
GFR, Estimated: >60 mL/min (ref >60)
Glucose, Bld: 153 mg/dL — ABNORMAL HIGH (ref 70–99)
Potassium: 3.9 mmol/L (ref 3.5–5.1)
Sodium: 140 mmol/L (ref 135–145)
Total Bilirubin: 0.7 mg/dL (ref 0.3–1.2)
Total Protein: 5.6 g/dL — ABNORMAL LOW (ref 6.5–8.1)

## 2020-06-24 LAB — CBC
HCT: 31.4 % — ABNORMAL LOW (ref 39.0–52.0)
Hemoglobin: 9 g/dL — ABNORMAL LOW (ref 13.0–17.0)
MCH: 26.9 pg (ref 26.0–34.0)
MCHC: 28.7 g/dL — ABNORMAL LOW (ref 30.0–36.0)
MCV: 94 fL (ref 80.0–100.0)
Platelets: 232 10*3/uL (ref 150–400)
RBC: 3.34 MIL/uL — ABNORMAL LOW (ref 4.22–5.81)
RDW: 23.2 % — ABNORMAL HIGH (ref 11.5–15.5)
WBC: 8.4 10*3/uL (ref 4.0–10.5)
nRBC: 3.7 % — ABNORMAL HIGH (ref 0.0–0.2)

## 2020-06-24 LAB — URINALYSIS, ROUTINE W REFLEX MICROSCOPIC
Bacteria, UA: NONE SEEN
Bilirubin Urine: NEGATIVE
Glucose, UA: NEGATIVE mg/dL
Ketones, ur: NEGATIVE mg/dL
Leukocytes,Ua: NEGATIVE
Nitrite: NEGATIVE
Protein, ur: 30 mg/dL — AB
Specific Gravity, Urine: 1.026 (ref 1.005–1.030)
pH: 5 (ref 5.0–8.0)

## 2020-06-24 LAB — D-DIMER, QUANTITATIVE: D-Dimer, Quant: 19.33 ug/mL-FEU — ABNORMAL HIGH (ref 0.00–0.50)

## 2020-06-24 LAB — BRAIN NATRIURETIC PEPTIDE: B Natriuretic Peptide: 281.3 pg/mL — ABNORMAL HIGH (ref 0.0–100.0)

## 2020-06-24 NOTE — ED Notes (Signed)
Pt has preexisting indwelling foley catheter, noted amber urine, urine culture added. Pt requesting foley bag to replace leg bag for urine collection.

## 2020-06-24 NOTE — ED Notes (Signed)
IV removed from pt pervious visit to [place] earlier today pt reports leaving AMA, went for similar complaint.

## 2020-06-24 NOTE — ED Provider Notes (Signed)
[facility] EMERGENCY DEPARTMENT Provider Note   CSN: [id number] Arrival date & time: 06/24/20  1738     History Chief Complaint  Patient presents with  . Tachycardia    Chris Morales is a 64 y.o. male.  The history is provided by the patient.  Illness Location:  Tachycardia in setting of recent illness Severity:  Moderate Onset quality:  Gradual Duration:  1 week Timing:  Constant Progression:  Unable to specify Chronicity:  New Context:  Pt left ICU (admit for SOB s/p intubation) in [place] today to fly here for treatment, known metastatic prostate cancer  Associated symptoms: fatigue   Associated symptoms: no chest pain, no congestion, no cough, no fever, no headaches, no nausea, no shortness of breath, no sore throat and no vomiting        Past Medical History:  Diagnosis Date  . Prostate CA [facility])     Patient Active Problem List   Diagnosis Date Noted  . Malignant neoplasm of prostate metastatic to bone ([place]) 02/22/2019  . 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 AKI (acute kidney injury) ([place]) 02/20/2019  . Anemia 02/20/2019  . Prostate CA ([place]) 02/20/2019  . Cervical myelopathy ([place]) 02/19/2019    Past Surgical History:  Procedure Laterality Date  . LAMINECTOMY Bilateral 02/20/2019   Procedure: CERVICAL LAMINECTOMY FOR TUMOR;  Surgeon: Earnie Larsson, MD;  Location: [facility];  Service: Neurosurgery;  Laterality: Bilateral;  CERVICAL LAMINECTOMY FOR TUMOR  . LIGAMENT REPAIR Left        History reviewed. No pertinent family history.  Social History   Tobacco Use  . Smoking status: Never Smoker  . Smokeless tobacco: Never Used  Vaping Use  . Vaping Use: Never used  Substance Use Topics  . Alcohol use: Yes  . Drug use: No    Home Medications Prior to Admission medications   Medication Sig Start Date End Date Taking? Authorizing Provider  apixaban (ELIQUIS) 5 MG TABS tablet Take 5 mg by mouth 2 (two) times daily.   Yes [provider]  bicalutamide (CASODEX) 50 MG tablet Take 50 mg by mouth daily.   Yes [provider]  digoxin (LANOXIN) 0.25 MG tablet Take 0.25 mg by mouth daily.   Yes [provider]  docusate sodium (COLACE) 100 MG capsule Take 100 mg by mouth 2 (two) times daily as needed (constipation).   Yes [provider]  folic acid (FOLVITE) 1 MG tablet Take 1 mg by mouth daily.   Yes [provider]  levothyroxine (SYNTHROID) 25 MCG tablet Take 25 mcg by mouth daily before breakfast.   Yes [provider]  metoprolol succinate (TOPROL-XL) 50 MG 24 hr tablet Take 100 mg by mouth in the morning and at bedtime. Take with or immediately following a meal.   Yes [provider]  pantoprazole (PROTONIX) 40 MG tablet Take 40 mg by mouth daily.   Yes [provider]  senna (SENOKOT) 8.6 MG TABS tablet Take 1 tablet by mouth 2 (two) times daily as needed (constipation).   Yes [provider]  HYDROcodone-acetaminophen (NORCO/VICODIN) 5-325 MG tablet Take 1-2 tablets by mouth every 4 (four) hours as needed for moderate pain. Patient not taking: Reported on 06/24/2020 02/23/19   Earnie Larsson, MD  tamsulosin (FLOMAX) 0.4 MG CAPS capsule Take 1 capsule (0.4 mg total) by mouth daily. Patient not taking: Reported on 06/24/2020 02/24/19   Earnie Larsson, MD    Allergies    Patient has  no known allergies.  Review of Systems   Review of Systems  Constitutional: Positive for fatigue. Negative for chills and fever.  HENT: Negative for congestion and sore throat.   Respiratory: Negative for cough and shortness of breath.   Cardiovascular: Positive for leg swelling. Negative for chest pain.  Gastrointestinal: Negative for nausea and vomiting.  Genitourinary:       Foley in place  Musculoskeletal: Positive for back pain.  Neurological: Positive for weakness. Negative for headaches.  All other systems reviewed and are negative.   Physical  Exam Updated Vital Signs BP 108/78   Pulse (!) 120   Temp 98.2 F (36.8 C) (Oral)   Resp 20   Ht 6\' 1"  (1.854 m)   Wt 68 kg   SpO2 96%   BMI 19.79 kg/m   Physical Exam Vitals reviewed.  Constitutional:      General: He is not in acute distress.    Appearance: Normal appearance.  HENT:     Head: Normocephalic and atraumatic.     Nose: Nose normal.     Mouth/Throat:     Mouth: Mucous membranes are moist.     Pharynx: Oropharynx is clear.  Eyes:     Conjunctiva/sclera: Conjunctivae normal.  Cardiovascular:     Rate and Rhythm: Tachycardia present.  Pulmonary:     Effort: Pulmonary effort is normal.     Breath sounds: Normal breath sounds.  Abdominal:     General: Abdomen is flat.     Palpations: Abdomen is soft.     Tenderness: There is no abdominal tenderness.  Musculoskeletal:     Cervical back: Neck supple.     Right lower leg: Edema present.     Left lower leg: Edema present.  Skin:    General: Skin is warm and dry.  Neurological:     Mental Status: He is alert.  Psychiatric:        Mood and Affect: Mood normal.        Behavior: Behavior normal.     ED Results / Procedures / Treatments   Labs (all labs ordered are listed, but only abnormal results are displayed) Labs Reviewed  CBC - Abnormal; Notable for the following components:      Result Value   RBC 3.34 (*)    Hemoglobin 9.0 (*)    HCT 31.4 (*)    MCHC 28.7 (*)    RDW 23.2 (*)    nRBC 3.7 (*)    All other components within normal limits  URINALYSIS, ROUTINE W REFLEX MICROSCOPIC - Abnormal; Notable for the following components:   Color, Urine AMBER (*)    APPearance HAZY (*)    Hgb urine dipstick SMALL (*)    Protein, ur 30 (*)    All other components within normal limits  COMPREHENSIVE METABOLIC PANEL - Abnormal; Notable for the following components:   Glucose, Bld 153 (*)    Calcium 7.5 (*)    Total Protein 5.6 (*)    Albumin 1.9 (*)    AST 49 (*)    Alkaline Phosphatase 2,755 (*)     All other components within normal limits  D-DIMER, QUANTITATIVE (NOT AT [facility]) - Abnormal; Notable for the following components:   D-Dimer, Quant 19.33 (*)    All other components within normal limits  BRAIN NATRIURETIC PEPTIDE - Abnormal; Notable for the following components:   B Natriuretic Peptide 281.3 (*)    All other components within normal limits  URINE CULTURE  PATHOLOGIST  SMEAR REVIEW  DIGOXIN LEVEL  CBG MONITORING, ED    EKG EKG Interpretation  Date/Time:  Monday June 24 2020 18:40:58 EDT Ventricular Rate:  130 PR Interval:    QRS Duration: 74 QT Interval:  272 QTC Calculation: 400 R Axis:   54 Text Interpretation: Atrial fibrillation with rapid ventricular response Cannot rule out Anterior infarct , age undetermined Abnormal ECG afib new Confirmed by Merrily Pew [phone]) on 06/24/2020 8:46:04 PM   Radiology DG Chest Portable 1 View  Result Date: 06/24/2020 CLINICAL DATA:  Tachycardia. EXAM: PORTABLE CHEST 1 VIEW COMPARISON:  February 19, 2019 FINDINGS: There is cardiomegaly. There are moderate to large bilateral pleural effusions. There is bibasilar atelectasis. There is diffuse sclerosis throughout the visualized osseous structures most notably the thoracic spine. There is no pneumothorax. IMPRESSION: 1. Moderate to large bilateral pleural effusions with adjacent atelectasis. 2. Cardiomegaly. 3. Progressive sclerosis throughout the visualized osseous structures consistent with metastatic disease. Electronically Signed   By: Constance Holster M.D.   On: 06/24/2020 21:33    Procedures Procedures (including critical care time)  Medications Ordered in ED Medications  metoprolol tartrate (LOPRESSOR) injection 5 mg (has no administration in time range)    ED Course  I have reviewed the triage vital signs and the nursing notes.  Pertinent labs & imaging results that were available during my care of the patient were reviewed by me and considered in my medical  decision making (see chart for details).    MDM Rules/Calculators/A&P                          Medical Decision Making: Jyaire Koudelka is a 64 y.o. male who presented to the ED today with tachycardia.  Past medical history significant for metastatic prostate cancer  Reviewed and confirmed nursing documentation for past medical history, family history, social history.  On my initial exam, the pt was in NAD, resting comfortably, normal WOB, lungs CTAB, tachycardic, not diaphoretic, BL LE edema 1+ in ankles.   All radiology and laboratory studies reviewed independently and with my attending physician, agree with reading provided by radiologist unless otherwise noted.   A fib with RVR. -pt on metoprolol at home, given IV metoprolol for rate control.  CXR shows large pleural effusions and Cardiomegaly. D dimer 19 today, CTA from OSH on 10/17 without evidence of PE (shows suspected mets in ribs). Pt is already on eliquis for anticoagulation.  Pt reporting weakness present since admission, OSH CT head on 06/16/20  shows no hemorrhage or ischemia.   Upon reassessing patient, patient was resting comfortably.   Based on the above findings, I believe patient requires admission.   The above care was discussed with and agreed upon by my attending physician. Emergency Department Medication Summary:  Medications  metoprolol tartrate (LOPRESSOR) injection 5 mg (has no administration in time range)       Final Clinical Impression(s) / ED Diagnoses Final diagnoses:  Atrial fibrillation with RVR [facility])    Rx / [place] Orders ED Discharge Orders    None       Roosevelt Locks, MD 06/25/20 Sande Rives    Merrily Pew, MD 06/25/20 2304

## 2020-06-24 NOTE — ED Triage Notes (Addendum)
Patient arrives to ED with complaints of shortness of breath and tachycardia. Pt states he's been more SOB over the past week that has been progressively worsening. Pt states his pulse has been getting up to the 130's starting today. Pt has metastatic prostate cancer that was dx a year ago, pt has not been treated for his CA. Left the ICU in [place] today,and flew to [place] to be treated by [employer]. Pt 100% on room air, HR 130's in triage.

## 2020-06-25 ENCOUNTER — Observation Stay (HOSPITAL_COMMUNITY): Payer: PRIVATE HEALTH INSURANCE

## 2020-06-25 ENCOUNTER — Inpatient Hospital Stay (HOSPITAL_COMMUNITY): Payer: PRIVATE HEALTH INSURANCE

## 2020-06-25 ENCOUNTER — Encounter (HOSPITAL_COMMUNITY): Payer: Self-pay | Admitting: Internal Medicine

## 2020-06-25 DIAGNOSIS — R531 Weakness: Secondary | ICD-10-CM

## 2020-06-25 DIAGNOSIS — R0602 Shortness of breath: Secondary | ICD-10-CM | POA: Diagnosis not present

## 2020-06-25 DIAGNOSIS — M6281 Muscle weakness (generalized): Secondary | ICD-10-CM | POA: Diagnosis present

## 2020-06-25 DIAGNOSIS — I5033 Acute on chronic diastolic (congestive) heart failure: Secondary | ICD-10-CM | POA: Diagnosis present

## 2020-06-25 DIAGNOSIS — I4891 Unspecified atrial fibrillation: Secondary | ICD-10-CM | POA: Diagnosis not present

## 2020-06-25 DIAGNOSIS — R29898 Other symptoms and signs involving the musculoskeletal system: Secondary | ICD-10-CM | POA: Diagnosis not present

## 2020-06-25 DIAGNOSIS — R600 Localized edema: Secondary | ICD-10-CM | POA: Diagnosis not present

## 2020-06-25 DIAGNOSIS — Z789 Other specified health status: Secondary | ICD-10-CM | POA: Diagnosis not present

## 2020-06-25 DIAGNOSIS — Z978 Presence of other specified devices: Secondary | ICD-10-CM | POA: Diagnosis not present

## 2020-06-25 DIAGNOSIS — R7989 Other specified abnormal findings of blood chemistry: Secondary | ICD-10-CM | POA: Diagnosis present

## 2020-06-25 DIAGNOSIS — I361 Nonrheumatic tricuspid (valve) insufficiency: Secondary | ICD-10-CM | POA: Diagnosis not present

## 2020-06-25 DIAGNOSIS — K761 Chronic passive congestion of liver: Secondary | ICD-10-CM

## 2020-06-25 DIAGNOSIS — I340 Nonrheumatic mitral (valve) insufficiency: Secondary | ICD-10-CM

## 2020-06-25 DIAGNOSIS — Z7189 Other specified counseling: Secondary | ICD-10-CM | POA: Diagnosis not present

## 2020-06-25 DIAGNOSIS — M4802 Spinal stenosis, cervical region: Secondary | ICD-10-CM | POA: Diagnosis present

## 2020-06-25 DIAGNOSIS — G893 Neoplasm related pain (acute) (chronic): Secondary | ICD-10-CM | POA: Diagnosis not present

## 2020-06-25 DIAGNOSIS — D649 Anemia, unspecified: Secondary | ICD-10-CM | POA: Diagnosis not present

## 2020-06-25 DIAGNOSIS — J90 Pleural effusion, not elsewhere classified: Secondary | ICD-10-CM | POA: Diagnosis present

## 2020-06-25 DIAGNOSIS — E0781 Sick-euthyroid syndrome: Secondary | ICD-10-CM | POA: Diagnosis not present

## 2020-06-25 DIAGNOSIS — G959 Disease of spinal cord, unspecified: Secondary | ICD-10-CM | POA: Diagnosis not present

## 2020-06-25 DIAGNOSIS — Z20822 Contact with and (suspected) exposure to covid-19: Secondary | ICD-10-CM | POA: Diagnosis present

## 2020-06-25 DIAGNOSIS — I482 Chronic atrial fibrillation, unspecified: Secondary | ICD-10-CM | POA: Diagnosis present

## 2020-06-25 DIAGNOSIS — Z515 Encounter for palliative care: Secondary | ICD-10-CM | POA: Diagnosis not present

## 2020-06-25 DIAGNOSIS — D630 Anemia in neoplastic disease: Secondary | ICD-10-CM | POA: Diagnosis present

## 2020-06-25 DIAGNOSIS — I4821 Permanent atrial fibrillation: Secondary | ICD-10-CM | POA: Diagnosis present

## 2020-06-25 DIAGNOSIS — R64 Cachexia: Secondary | ICD-10-CM | POA: Diagnosis present

## 2020-06-25 DIAGNOSIS — R911 Solitary pulmonary nodule: Secondary | ICD-10-CM | POA: Diagnosis present

## 2020-06-25 DIAGNOSIS — R609 Edema, unspecified: Secondary | ICD-10-CM | POA: Diagnosis not present

## 2020-06-25 DIAGNOSIS — C61 Malignant neoplasm of prostate: Secondary | ICD-10-CM | POA: Diagnosis present

## 2020-06-25 DIAGNOSIS — R5381 Other malaise: Secondary | ICD-10-CM | POA: Diagnosis not present

## 2020-06-25 DIAGNOSIS — M62522 Muscle wasting and atrophy, not elsewhere classified, left upper arm: Secondary | ICD-10-CM | POA: Diagnosis present

## 2020-06-25 DIAGNOSIS — Z7901 Long term (current) use of anticoagulants: Secondary | ICD-10-CM | POA: Diagnosis not present

## 2020-06-25 DIAGNOSIS — I959 Hypotension, unspecified: Secondary | ICD-10-CM | POA: Diagnosis present

## 2020-06-25 DIAGNOSIS — C7932 Secondary malignant neoplasm of cerebral meninges: Secondary | ICD-10-CM | POA: Diagnosis present

## 2020-06-25 DIAGNOSIS — N139 Obstructive and reflux uropathy, unspecified: Secondary | ICD-10-CM | POA: Diagnosis not present

## 2020-06-25 DIAGNOSIS — R932 Abnormal findings on diagnostic imaging of liver and biliary tract: Secondary | ICD-10-CM | POA: Diagnosis present

## 2020-06-25 DIAGNOSIS — J918 Pleural effusion in other conditions classified elsewhere: Secondary | ICD-10-CM | POA: Diagnosis present

## 2020-06-25 DIAGNOSIS — D329 Benign neoplasm of meninges, unspecified: Secondary | ICD-10-CM | POA: Diagnosis present

## 2020-06-25 DIAGNOSIS — E039 Hypothyroidism, unspecified: Secondary | ICD-10-CM | POA: Diagnosis present

## 2020-06-25 DIAGNOSIS — J9601 Acute respiratory failure with hypoxia: Secondary | ICD-10-CM | POA: Diagnosis present

## 2020-06-25 DIAGNOSIS — Z681 Body mass index (BMI) 19 or less, adult: Secondary | ICD-10-CM | POA: Diagnosis not present

## 2020-06-25 DIAGNOSIS — C7951 Secondary malignant neoplasm of bone: Secondary | ICD-10-CM

## 2020-06-25 DIAGNOSIS — N138 Other obstructive and reflux uropathy: Secondary | ICD-10-CM | POA: Diagnosis present

## 2020-06-25 DIAGNOSIS — N179 Acute kidney failure, unspecified: Secondary | ICD-10-CM | POA: Diagnosis not present

## 2020-06-25 LAB — IRON AND TIBC
Iron: 46 ug/dL (ref 45–182)
Saturation Ratios: 33 % (ref 17.9–39.5)
TIBC: 141 ug/dL — ABNORMAL LOW (ref 250–450)
UIBC: 95 ug/dL

## 2020-06-25 LAB — ECHOCARDIOGRAM COMPLETE BUBBLE STUDY
Area-P 1/2: 3.77 cm2
Calc EF: 52.2 %
S' Lateral: 3 cm
Single Plane A2C EF: 49.1 %
Single Plane A4C EF: 52.9 %

## 2020-06-25 LAB — RESPIRATORY PANEL BY RT PCR (FLU A&B, COVID)
Influenza A by PCR: NEGATIVE
Influenza B by PCR: NEGATIVE
SARS Coronavirus 2 by RT PCR: NEGATIVE

## 2020-06-25 LAB — LIPID PANEL
Cholesterol: 132 mg/dL (ref 0–200)
HDL: 28 mg/dL — ABNORMAL LOW (ref >40)
LDL Cholesterol: 87 mg/dL (ref 0–99)
Total CHOL/HDL Ratio: 4.7 RATIO
Triglycerides: 87 mg/dL (ref <150)
VLDL: 17 mg/dL (ref 0–40)

## 2020-06-25 LAB — DIGOXIN LEVEL: Digoxin Level: 1 ng/mL (ref 1.0–2.0)

## 2020-06-25 LAB — FOLATE: Folate: 9.6 ng/mL (ref >5.9)

## 2020-06-25 LAB — TSH: TSH: 7.345 u[IU]/mL — ABNORMAL HIGH (ref 0.350–4.500)

## 2020-06-25 LAB — MAGNESIUM: Magnesium: 2 mg/dL (ref 1.7–2.4)

## 2020-06-25 LAB — PATHOLOGIST SMEAR REVIEW

## 2020-06-25 LAB — VITAMIN B12: Vitamin B-12: 810 pg/mL (ref 180–914)

## 2020-06-25 LAB — PROCALCITONIN: Procalcitonin: 0.43 ng/mL

## 2020-06-25 LAB — TROPONIN I (HIGH SENSITIVITY): Troponin I (High Sensitivity): 15 ng/L (ref <18)

## 2020-06-25 LAB — HIV ANTIBODY (ROUTINE TESTING W REFLEX): HIV Screen 4th Generation wRfx: NONREACTIVE

## 2020-06-25 MED ORDER — CHLORHEXIDINE GLUCONATE CLOTH 2 % EX PADS
6.0000 | MEDICATED_PAD | Freq: Every day | CUTANEOUS | Status: DC
  Administered 2020-06-25 – 2020-07-05 (×9): 6 via TOPICAL

## 2020-06-25 MED ORDER — POLYETHYLENE GLYCOL 3350 17 G PO PACK: 17.0000 g | PACK | Freq: Every day | ORAL | Status: DC | PRN

## 2020-06-25 MED ORDER — METOPROLOL TARTRATE 5 MG/5ML IV SOLN: 5.0000 mg | Freq: Once | INTRAVENOUS | Status: DC

## 2020-06-25 MED ORDER — FUROSEMIDE 10 MG/ML IJ SOLN
20.0000 mg | Freq: Two times a day (BID) | INTRAMUSCULAR | Status: DC
  Administered 2020-06-25: 20 mg via INTRAVENOUS
  Filled 2020-06-25 (×2): qty 2

## 2020-06-25 MED ORDER — DILTIAZEM HCL-DEXTROSE 125-5 MG/125ML-% IV SOLN (PREMIX)
5.0000 mg/h | INTRAVENOUS | Status: DC
  Administered 2020-06-25: 5 mg/h via INTRAVENOUS
  Administered 2020-06-25: 15 mg/h via INTRAVENOUS
  Administered 2020-06-25: 7.5 mg/h via INTRAVENOUS
  Filled 2020-06-25 (×2): qty 125

## 2020-06-25 MED ORDER — LORAZEPAM 2 MG/ML IJ SOLN
1.0000 mg | Freq: Once | INTRAMUSCULAR | Status: AC
  Administered 2020-06-25: 1 mg via INTRAVENOUS
  Filled 2020-06-25: qty 1

## 2020-06-25 MED ORDER — DILTIAZEM LOAD VIA INFUSION
15.0000 mg | Freq: Once | INTRAVENOUS | Status: AC
  Administered 2020-06-25: 15 mg via INTRAVENOUS
  Filled 2020-06-25: qty 15

## 2020-06-25 MED ORDER — SODIUM CHLORIDE 0.9 % IV BOLUS
500.0000 mL | Freq: Once | INTRAVENOUS | Status: AC
  Administered 2020-06-25: 500 mL via INTRAVENOUS

## 2020-06-25 MED ORDER — DIGOXIN 250 MCG PO TABS
0.2500 mg | ORAL_TABLET | Freq: Every day | ORAL | Status: DC
  Administered 2020-06-25 – 2020-07-05 (×11): 0.25 mg via ORAL
  Filled 2020-06-25 (×11): qty 1

## 2020-06-25 MED ORDER — ONDANSETRON HCL 4 MG/2ML IJ SOLN: 4.0000 mg | Freq: Four times a day (QID) | INTRAMUSCULAR | Status: DC | PRN

## 2020-06-25 MED ORDER — GADOBUTROL 1 MMOL/ML IV SOLN
10.0000 mL | Freq: Once | INTRAVENOUS | Status: AC | PRN
  Administered 2020-06-25: 10 mL via INTRAVENOUS

## 2020-06-25 MED ORDER — APIXABAN 5 MG PO TABS
5.0000 mg | ORAL_TABLET | Freq: Two times a day (BID) | ORAL | Status: DC
  Administered 2020-06-25 (×3): 5 mg via ORAL
  Filled 2020-06-25 (×3): qty 1

## 2020-06-25 MED ORDER — METOPROLOL TARTRATE 5 MG/5ML IV SOLN
5.0000 mg | Freq: Four times a day (QID) | INTRAVENOUS | Status: DC
  Administered 2020-06-25 (×2): 5 mg via INTRAVENOUS
  Filled 2020-06-25 (×3): qty 5

## 2020-06-25 MED ORDER — ACETAMINOPHEN 325 MG PO TABS: 650.0000 mg | ORAL_TABLET | ORAL | Status: DC | PRN

## 2020-06-25 MED ORDER — IOHEXOL 350 MG/ML SOLN
100.0000 mL | Freq: Once | INTRAVENOUS | Status: AC | PRN
  Administered 2020-06-25: 100 mL via INTRAVENOUS

## 2020-06-25 MED ORDER — SODIUM CHLORIDE 0.9 % IV BOLUS
250.0000 mL | Freq: Once | INTRAVENOUS | Status: AC
  Administered 2020-06-25: 250 mL via INTRAVENOUS

## 2020-06-25 MED ORDER — LEVOTHYROXINE SODIUM 25 MCG PO TABS
25.0000 ug | ORAL_TABLET | Freq: Every day | ORAL | Status: DC
  Administered 2020-06-26 – 2020-07-05 (×9): 25 ug via ORAL
  Filled 2020-06-25 (×9): qty 1

## 2020-06-25 MED ORDER — METOPROLOL SUCCINATE ER 100 MG PO TB24
100.0000 mg | ORAL_TABLET | Freq: Two times a day (BID) | ORAL | Status: DC
  Administered 2020-06-25 – 2020-07-05 (×19): 100 mg via ORAL
  Filled 2020-06-25 (×19): qty 1

## 2020-06-25 NOTE — Plan of Care (Signed)

## 2020-06-25 NOTE — Progress Notes (Signed)
  Echocardiogram 2D Echocardiogram has been performed.  Fidel Levy 06/25/2020, 10:05 AM

## 2020-06-25 NOTE — Progress Notes (Signed)
Went in to get shift report and pt's brother requested pt get ted hose. Paged provider on call to get an order for Ted hose. Went into room to apply [employer] and pt stated "its not neccessary" and doesn't want them to be applied.

## 2020-06-25 NOTE — Progress Notes (Signed)
ANTICOAGULATION CONSULT NOTE - Initial Consult  Pharmacy Consult for Eliquis Indication: atrial fibrillation  No Known Allergies  Patient Measurements: Height: 6\' 1"  (185.4 cm) Weight: 68 kg (150 lb) IBW/kg (Calculated) : 79.9  Vital Signs: Temp: 98.2 F (36.8 C) (10/25 1749) Temp Source: Oral (10/25 1749) BP: 112/81 (10/26 0100) Pulse Rate: 119 (10/26 0100)  Labs: Recent Labs    06/24/20 1852  HGB 9.0*  HCT 31.4*  PLT 232  CREATININE 0.61    Estimated Creatinine Clearance: 89.7 mL/min (by C-G formula based on SCr of 0.61 mg/dL).   Medical History: Past Medical History:  Diagnosis Date  . Prostate CA [facility])     Medications:  No current facility-administered medications on file prior to encounter.   Current Outpatient Medications on File Prior to Encounter  Medication Sig Dispense Refill  . apixaban (ELIQUIS) 5 MG TABS tablet Take 5 mg by mouth 2 (two) times daily.    . bicalutamide (CASODEX) 50 MG tablet Take 50 mg by mouth daily.    . digoxin (LANOXIN) 0.25 MG tablet Take 0.25 mg by mouth daily.    Marland Kitchen docusate sodium (COLACE) 100 MG capsule Take 100 mg by mouth 2 (two) times daily as needed (constipation).    . folic acid (FOLVITE) 1 MG tablet Take 1 mg by mouth daily.    Marland Kitchen levothyroxine (SYNTHROID) 25 MCG tablet Take 25 mcg by mouth daily before breakfast.    . metoprolol succinate (TOPROL-XL) 50 MG 24 hr tablet Take 100 mg by mouth in the morning and at bedtime. Take with or immediately following a meal.    . pantoprazole (PROTONIX) 40 MG tablet Take 40 mg by mouth daily.    Marland Kitchen senna (SENOKOT) 8.6 MG TABS tablet Take 1 tablet by mouth 2 (two) times daily as needed (constipation).    Marland Kitchen HYDROcodone-acetaminophen (NORCO/VICODIN) 5-325 MG tablet Take 1-2 tablets by mouth every 4 (four) hours as needed for moderate pain. (Patient not taking: Reported on 06/24/2020) 30 tablet 0  . tamsulosin (FLOMAX) 0.4 MG CAPS capsule Take 1 capsule (0.4 mg total) by mouth daily.  (Patient not taking: Reported on 06/24/2020) 30 capsule 2     Assessment: 64 y.o. male with Afib to continue Eliquis   Plan:  Eliquis 5 mg BID  Lace Chenevert, Bronson Curb 06/25/2020,1:42 AM

## 2020-06-25 NOTE — ED Notes (Signed)
Oxygen applied due to pt sp02 dropping while he is sleeping

## 2020-06-25 NOTE — Plan of Care (Signed)
?  Problem: Clinical Measurements: ?Goal: Will remain free from infection ?Outcome: Progressing ?  ?

## 2020-06-25 NOTE — ED Notes (Signed)
Pt states he would like to be left alone because he is extremely tired

## 2020-06-25 NOTE — ED Notes (Signed)
Pt requesting 2mg  Ativan prior to CT scan, Iona Beard, MD made aware at this time.

## 2020-06-25 NOTE — ED Notes (Signed)
Lunch Tray Ordered @ 1014.

## 2020-06-25 NOTE — ED Notes (Signed)
Patient transported to MRI 

## 2020-06-25 NOTE — ED Notes (Signed)
Pt is at MRI

## 2020-06-25 NOTE — Progress Notes (Addendum)
Follow Up Note  HPI: Patient is a 64 year old male with past medical history of prostate cancer with extensive metastases to the bone who resides in [place] and has been evaluated for weakness 16 months ago at [facility]. At that time, he was evaluated by neurosurgery and underwent cervical laminectomy with cause being bone metastases, felt to be from the prostate cancer. Patient was discharged back to [place] under the care of physicians there. He presented to mid [facility] in [place] on 10/17 and was there for 8 days after coming in with shortness of breath and tachycardia and found to be in rapid atrial fibrillation and respiratory failure requiring intubation and placed in ICU. Patient was tried on multiple AV nodal blocking agents including amiodarone, digoxin and finally placed on scheduled metoprolol therapy. Eventually extubated. Attempts made at chemical cardioversion failed. Patient was placed on Eliquis for anticoagulation. Following extubation, patient realized he was having left-sided weakness, specifically left upper extremity. He left AMA on the morning of 10/25 after there was delay in getting an MRI of his brain.   Patient came down to [facility] where his brother here is a retired Software engineer. Upon landing at the airport, he went to [facility] emergency room for evaluation. There he was found to be in rapid atrial fibrillation. On admission he was noted to have a D-dimer of 19.33, chest x-ray noting bilateral pleural effusions with evidence of diffuse metastatic lesions in the bones and patient continued to complain of left-sided weakness. Admitted to the hospitalist service earlier this morning.  Following admission, patient seen down the emergency room. Attempts to get his heart rate under control have not been successful. He is currently on a Cardizem drip, with heart rate still staying greater than 120. 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 MRI of C-spine ordered.  Exam: CV: Irregular rhythm, tachycardic Lungs: Decreased breath sounds bibasilar Abd: Soft, nontender, nondistended, positive bowel sounds Ext: No clubbing or cyanosis, 1+ pitting edema  Principal Problem:   Atrial fibrillation with rapid ventricular response ([place]): Currently on Cardizem drip. Will discuss with cardiology although I suspect that with his multiple metastases and bone involvement they could be a chronic factor making it extremely difficult to get his heart rate down. We will continue to try oral metoprolol and Cardizem. Active Problems:   Malignant neoplasm of prostate metastatic to bone [facility]): Certainly concerning. Will discuss with patient and may need radiation oncology if the plan is to stay here in [place]. Have asked palliative care for consult in the interim.    Euthyroid sick syndrome: TSH labs consistent with current issues. No reason to change his ongoing Synthroid at this time.    Left-sided weakness: Given patient's previous history of's cervical metastases, suspect that it is recurrent due to severity of malignancy. Have ordered cervical spine MRI. Discussed with neurology by phone. Dural metastases seen on brain MRI would not account for his symptoms.    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 This Foley catheter was placement while the patient was intubated. Urine fortunately does not show any signs of infection. Could try voiding trial after patient's condition has stabilized. For now he is getting Lasix.     Pleural effusion, bilateral:  Secondary to CHF from A. fib. We will discontinue all fluids and try IV Lasix.   Normocytic anemia   Elevated d-dimer: Likely in the  setting of patient's metastatic cancer. He was noted to have a nutmeg liver seen on abdominal CT in [place] concerning for portal vein thrombosis which could be the source of markedly elevated D-dimer. However, hepatic Doppler here are unremarkable.     Disposition: Dependent on plans for treatment

## 2020-06-25 NOTE — H&P (Signed)
History and Physical    Chris Morales KPT:[id number] DOB: [birth year] DOA: 06/24/2020  PCP: Patient, No Pcp Per  Patient coming from: [place]   Chief Complaint:  Chief Complaint  Patient presents with  . Tachycardia     HPI:    64 year old male with past medical history of prostate cancer, diffusely metastatic to bone presented to [facility] emergency department with complaints of tachycardia, lower extremity edema and left-sided weakness.  Patient explains that 9 days ago he began to develop progressively worsening shortness of breath.  Shortness breath is severe in intensity.  Shortness of breath was worse with exertion and improved with rest.  The shortness of breath is associated with bilateral lower extremity edema and generalized weakness.  Patient denies any fevers, cough, chest pain, sick contacts or confirmed contact with COVID-19 infection.  Patient is immunized for COVID-19.  Patient symptoms continued to worsen over several days until the patient presented to a local hospital in his area, [facility] in [place], [place].  Patient was hospitalized at [facility] in [place] from 10/17 to 10/25.  Patient lives in [place] and was admitted to that facility when he presented with shortness of breath and tachycardia.  Patient was found to be in rapid atrial fibrillation and in respiratory failure requiring intubation and admission to the intensive care unit.  Patient was tried on multiple AV nodal blocking agents including amiodarone, digoxin and finally was placed on scheduled metoprolol therapy.  Attempts were made at chemical cardioversion and failed.  Patient was eventually extubated. Patient was placed on Eliquis for anticoagulation .  After patient was extubated patient realized that he was suffering from left-sided weakness, particularly the left upper extremity.  Patient was frustrated as to the delay in getting an MRI of his brain and decided  to leave AMA early in the morning on 10/25.  No transcribed notes are available for this hospitalization, the summary above has been put together based on discussions with the brother who is a retired Engineer, drilling.  After leaving the [facility] AMA the patient immediately jumped on a flight down to [facility] where his brother who is a retired Software engineer lives.  Immediately after landing the patient proceeded to present to [facility] emergency  for evaluation.  Upon evaluation in the emergency department patient is found to continue to be in rapid atrial fibrillation.  Chest x-ray was performed revealing substantial bilateral pleural effusions with evidence of diffuse metastatic lesions in the bones.  On presentation patient continued to complain of left-sided weakness.  Patient was also found to have a markedly elevated D-dimer of 19.33.  The hospitalist group was then called to assess the patient for admission to the hospital.   Review of Systems:   Review of Systems  Respiratory: Positive for shortness of breath.   Cardiovascular: Positive for leg swelling.  Neurological: Positive for weakness.  All other systems reviewed and are negative.   Past Medical History:  Diagnosis Date  . Prostate CA [facility])     Past Surgical History:  Procedure Laterality Date  . LAMINECTOMY Bilateral 02/20/2019   Procedure: CERVICAL LAMINECTOMY FOR TUMOR;  Surgeon: Earnie Larsson, MD;  Location: [place];  Service: Neurosurgery;  Laterality: Bilateral;  CERVICAL LAMINECTOMY FOR TUMOR  . LIGAMENT REPAIR Left      reports that he has never smoked. He has never used smokeless tobacco. He reports current alcohol use. He reports that he does not use drugs.  No  Known Allergies  Family History  Problem Relation Age of Onset  . Prostate cancer Brother      Prior to Admission medications   Medication Sig Start Date End Date Taking? Authorizing Provider  apixaban (ELIQUIS) 5 MG TABS tablet Take  5 mg by mouth 2 (two) times daily.   Yes [provider]  bicalutamide (CASODEX) 50 MG tablet Take 50 mg by mouth daily.   Yes [provider]  digoxin (LANOXIN) 0.25 MG tablet Take 0.25 mg by mouth daily.   Yes [provider]  docusate sodium (COLACE) 100 MG capsule Take 100 mg by mouth 2 (two) times daily as needed (constipation).   Yes [provider]  folic acid (FOLVITE) 1 MG tablet Take 1 mg by mouth daily.   Yes [provider]  levothyroxine (SYNTHROID) 25 MCG tablet Take 25 mcg by mouth daily before breakfast.   Yes [provider]  metoprolol succinate (TOPROL-XL) 50 MG 24 hr tablet Take 100 mg by mouth in the morning and at bedtime. Take with or immediately following a meal.   Yes [provider]  pantoprazole (PROTONIX) 40 MG tablet Take 40 mg by mouth daily.   Yes [provider]  senna (SENOKOT) 8.6 MG TABS tablet Take 1 tablet by mouth 2 (two) times daily as needed (constipation).   Yes [provider]  HYDROcodone-acetaminophen (NORCO/VICODIN) 5-325 MG tablet Take 1-2 tablets by mouth every 4 (four) hours as needed for moderate pain. Patient not taking: Reported on 06/24/2020 02/23/19   Earnie Larsson, MD  tamsulosin (FLOMAX) 0.4 MG CAPS capsule Take 1 capsule (0.4 mg total) by mouth daily. Patient not taking: Reported on 06/24/2020 02/24/19   Earnie Larsson, MD    Physical Exam: Vitals:   06/25/20 0100 06/25/20 0200 06/25/20 0400 06/25/20 0500  BP: 112/81 110/74 107/71 99/72  Pulse: (!) 119 (!) 121 (!) 116 (!) 111  Resp: (!) 21 18 (!) 25 14  Temp:      TempSrc:      SpO2: 95% 94% 93% 93%  Weight:      Height:        Constitutional: Acute alert and oriented x3, patient is in mild respiratory distress Skin: no rashes, no lesions, good skin turgor noted. Eyes: Pupils are equally reactive to light.  No evidence of scleral icterus or conjunctival pallor.  ENMT: Moist mucous membranes noted.  Posterior  pharynx clear of any exudate or lesions.   Neck: normal, supple, no masses, no thyromegaly.  No evidence of jugular venous distension.   Respiratory: Markedly diminished breath sounds at the bases bilaterally with associated rales and scattered rhonchi bilaterally.  No evidence of wheezing.  Normal respiratory effort. No accessory muscle use.  Cardiovascular: Tachycardic and irregularly irregular, no murmurs / rubs / gallops.  Significant bilateral lower extremity pitting edema that tracks in the feet all the way up to the knees.  2+ pedal pulses. No carotid bruits.  Chest:   Nontender without crepitus or deformity.   Back:   Nontender without crepitus or deformity. Abdomen: Abdomen is protuberant but soft and nontender.  No evidence of intra-abdominal masses.  Positive bowel sounds noted in all quadrants.   Musculoskeletal: No joint deformity upper and lower extremities. Good ROM, no contractures. Normal muscle tone.  Neurologic: CN 2-12 grossly intact. Sensation intact.  Notable weakness of the left upper extremity both the distal and proximal muscle groups.  No significant weakness observed of the left lower extremity.  Sensation is  grossly intact.  Patient is following all commands.  Patient is responsive to verbal stimuli.   Psychiatric: Patient exhibits a depressed mood with flat affect.  Patient seems to possess insight as to their current situation.     Labs on Admission: I have personally reviewed following labs and imaging studies -   CBC: Recent Labs  Lab 06/24/20 1852  WBC 8.4  HGB 9.0*  HCT 31.4*  MCV 94.0  PLT 938   Basic Metabolic Panel: Recent Labs  Lab 06/24/20 1852  NA 140  K 3.9  CL 107  CO2 25  GLUCOSE 153*  BUN 9  CREATININE 0.61  CALCIUM 7.5*   GFR: Estimated Creatinine Clearance: 89.7 mL/min (by C-G formula based on SCr of 0.61 mg/dL). Liver Function Tests: Recent Labs  Lab 06/24/20 1852  AST 49*  ALT 29  ALKPHOS 2,755*  BILITOT 0.7  PROT 5.6*   ALBUMIN 1.9*   No results for input(s): LIPASE, AMYLASE in the last 168 hours. No results for input(s): AMMONIA in the last 168 hours. Coagulation Profile: No results for input(s): INR, PROTIME in the last 168 hours. Cardiac Enzymes: No results for input(s): CKTOTAL, CKMB, CKMBINDEX, TROPONINI in the last 168 hours. BNP (last 3 results) No results for input(s): PROBNP in the last 8760 hours. HbA1C: No results for input(s): HGBA1C in the last 72 hours. CBG: No results for input(s): GLUCAP in the last 168 hours. Lipid Profile: No results for input(s): CHOL, HDL, LDLCALC, TRIG, CHOLHDL, LDLDIRECT in the last 72 hours. Thyroid Function Tests: No results for input(s): TSH, T4TOTAL, FREET4, T3FREE, THYROIDAB in the last 72 hours. Anemia Panel: No results for input(s): VITAMINB12, FOLATE, FERRITIN, TIBC, IRON, RETICCTPCT in the last 72 hours. Urine analysis:    Component Value Date/Time   COLORURINE AMBER (A) 06/24/2020 2058   APPEARANCEUR HAZY (A) 06/24/2020 2058   LABSPEC 1.026 06/24/2020 2058   PHURINE 5.0 06/24/2020 2058   GLUCOSEU NEGATIVE 06/24/2020 2058   HGBUR SMALL (A) 06/24/2020 2058   BILIRUBINUR NEGATIVE 06/24/2020 2058   [place] NEGATIVE 06/24/2020 2058   PROTEINUR 30 (A) 06/24/2020 2058   NITRITE NEGATIVE 06/24/2020 2058   LEUKOCYTESUR NEGATIVE 06/24/2020 2058    Radiological Exams on Admission - Personally Reviewed: CT ANGIO CHEST PE W OR WO CONTRAST  Result Date: 06/25/2020 CLINICAL DATA:  Worsening shortness of breath EXAM: CT ANGIOGRAPHY CHEST WITH CONTRAST TECHNIQUE: Multidetector CT imaging of the chest was performed using the standard protocol during bolus administration of intravenous contrast. Multiplanar CT image reconstructions and MIPs were obtained to evaluate the vascular anatomy. CONTRAST:  165mL OMNIPAQUE IOHEXOL 350 MG/ML SOLN COMPARISON:  Radiograph same day FINDINGS: Cardiovascular: There is a optimal opacification of the pulmonary arteries. There  is no central,segmental, or subsegmental filling defects within the pulmonary arteries. There is mild cardiomegaly present. No pericardial effusion or thickening. No evidence right heart strain. There is normal three-vessel brachiocephalic anatomy without proximal stenosis. The thoracic aorta is normal in appearance. Mediastinum/Nodes: No hilar, mediastinal, or axillary adenopathy. Thyroid gland, trachea, and esophagus demonstrate no significant findings. Lungs/Pleura: There is moderate to large bilateral pleural effusion with adjacent compressive atelectasis seen. Patchy rounded ground-glass opacities are seen predominantly within the right upper lung and right middle lobe. Upper Abdomen: No acute abnormalities present in the visualized portions of the upper abdomen. There is diffuse anasarca present. Musculoskeletal: Extensive osseous sclerotic blastic lesions are seen throughout the visualized portion of the axial and appendicular skeleton. Review of the MIP images confirms the above  findings. IMPRESSION: No central, segmental, or subsegmental pulmonary embolism Moderate to large bilateral pleural effusions with adjacent compressive atelectasis. Ground-glass opacities within the right upper lung and right middle lobe, likely due to infectious or inflammatory etiology. Extensive diffuse osseous metastatic disease. Electronically Signed   By: Prudencio Pair M.D.   On: 06/25/2020 03:11   DG Chest Portable 1 View  Result Date: 06/24/2020 CLINICAL DATA:  Tachycardia. EXAM: PORTABLE CHEST 1 VIEW COMPARISON:  February 19, 2019 FINDINGS: There is cardiomegaly. There are moderate to large bilateral pleural effusions. There is bibasilar atelectasis. There is diffuse sclerosis throughout the visualized osseous structures most notably the thoracic spine. There is no pneumothorax. IMPRESSION: 1. Moderate to large bilateral pleural effusions with adjacent atelectasis. 2. Cardiomegaly. 3. Progressive sclerosis throughout the  visualized osseous structures consistent with metastatic disease. Electronically Signed   By: Constance Holster M.D.   On: 06/24/2020 21:33    EKG: Personally reviewed.  Rhythm is atrial fibrillation with rapid ventricular response with heart rate of 130 bpm.  No dynamic ST segment changes appreciated.  Assessment/Plan Principal Problem:   Atrial fibrillation with rapid ventricular response (HCC)   Patient continuing to exhibit persistent rapid atrial fibrillation  Based on medication reconciliation from recent hospital stay in [place], will try patient on scheduled intravenous metoprolol 5 mg every 6hours.  Patient is exhibiting bilateral pleural effusions and lower extremity pitting edema, likely secondary to some secondary diastolic dysfunction however I do not want to provide the patient with intravenous diuretics due to borderline hypotension.  Will monitor on telemetry overnight in the progressive unit and if we are unsuccessful in achieving rate control will formally consult cardiology for assistance -patient would likely be a candidate for electrical cardioversion.  In the setting of markedly elevated D-dimer obtaining CT angiogram of the chest to rule out pulmonary embolism in the setting of malignancy -this could potentially be the cause of the patient's rapid atrial fibrillation.  Additionally obtaining echocardiogram in the morning  Continuing regimen of Eliquis started at hospital in [place] Problems:   Left-sided weakness   Patient complains of ongoing weakness of the left side in the setting of new diagnosis of rapid atrial fibrillation bringing about concern for cardioembolic stroke  Considering this possibility as well as known metastatic malignancy obtaining MRI brain with and without contrast  On my examination, I really could only discern significant left upper extremity weakness and therefore nerve root impingement of the cervical roots is possible  while the patient was intubated just recently  Another possibility causing the patient's left-sided or left upper extremity weakness is progressive metastatic disease to the cervical spine causing spinal cord compression  After MRI is obtained, will consider formal neurology consultation in the morning based on what is identified.  Echocardiogram with bubble study also ordered, as mentioned above.    Malignant neoplasm of prostate metastatic to bone [facility])   Extensive metastatic disease to virtually every osseous structure  Patient was diagnosed with prostate cancer in June 2020 but has yet to receive treatment from an oncologist according to the patient   Will need to arrange for outpatient care with both urology and oncology at time of discharge.    Euthyroid sick syndrome   Patient was treated with Synthroid in the [facility] for a slightly elevated TSH and concerns for hypothyroidism  Considering the very mild elevation in TSH this most likely was euthyroid sick and does not require Synthroid at this time.  Patient would likely benefit from repeat thyroid panel once patient is clinically improved in 4 to 6 weeks.    Foley catheter in place on admission   Per review of records from 2020 patient has experienced postobstructive uropathy secondary to his prostate cancer in the past  However, this Foley catheter was placed at the [facility] when the patient was intubated  Patient may benefit from voiding trial during this hospitalization after condition has stabilized.    Pleural effusion, bilateral   Substantial pleural effusions likely related to transient diastolic dysfunction in the setting of ongoing rapid atrial fibrillation  Unfortunately, unable to give intravenous diuretics at this time due to borderline blood pressures  Will attempt achieve rate control first and consider intravenous diuretics later in the clinical course.  Obtaining  echocardiogram\  Patient is currently on room air    Normocytic anemia   Substantial anemia without clinical evidence of bleeding  Obtaining iron panel, folate, vitamin B12  Monitor hemoglobin and hematocrit with serial CBCs.    Elevated d-dimer   Markedly elevated D-dimer of 19.33  Obtaining CT angiogram of the chest to rule out pulmonary embolism in the setting of active malignancy  I have reviewed records from the facility [place] and cannot find a CT angiogram of the chest that was already performed  Additionally, CT of the abdomen and pelvis performed at the [place] facility did reveal a nutmeg liver concerning for portal vein thrombosis which could be the source of the markedly elevated D-dimer.  Obtaining a Doppler of the liver to evaluate for portal vein thrombosis    Nutmeg liver    Please see assessment and plan above.   Code Status:  Full code Family Communication: Brother is at bedside who has been updated on plan of care.  Status is: Observation  The patient remains OBS appropriate and will d/c before 2 midnights.  Dispo: The patient is from: Home              Anticipated d/c is to: Home              Anticipated d/c date is: 2 days              Patient currently is not medically stable to d/c.        Vernelle Emerald MD [facility] Pager [phone]  If 7PM-7AM, please contact night-coverage [email or web address] Use universal [place] password for that web site. If you do not have the password, please call the hospital operator.  06/25/2020, 5:35 AM

## 2020-06-26 ENCOUNTER — Inpatient Hospital Stay (HOSPITAL_COMMUNITY): Payer: PRIVATE HEALTH INSURANCE

## 2020-06-26 DIAGNOSIS — I4891 Unspecified atrial fibrillation: Secondary | ICD-10-CM | POA: Diagnosis not present

## 2020-06-26 LAB — HEMOGLOBIN A1C
Hgb A1c MFr Bld: 5.8 % — ABNORMAL HIGH (ref 4.8–5.6)
Mean Plasma Glucose: 120 mg/dL

## 2020-06-26 LAB — URINE CULTURE: Culture: NO GROWTH

## 2020-06-26 LAB — LACTIC ACID, PLASMA
Lactic Acid, Venous: 1.7 mmol/L (ref 0.5–1.9)
Lactic Acid, Venous: 2.6 mmol/L (ref 0.5–1.9)

## 2020-06-26 LAB — SARS CORONAVIRUS 2 (TAT 6-24 HRS): SARS Coronavirus 2: NEGATIVE

## 2020-06-26 MED ORDER — SODIUM CHLORIDE 0.9 % IV BOLUS
500.0000 mL | Freq: Once | INTRAVENOUS | Status: AC
  Administered 2020-06-26: 500 mL via INTRAVENOUS

## 2020-06-26 MED ORDER — LORAZEPAM 2 MG/ML IJ SOLN
2.0000 mg | Freq: Once | INTRAMUSCULAR | Status: AC
  Administered 2020-06-26: 2 mg via INTRAVENOUS
  Filled 2020-06-26: qty 1

## 2020-06-26 NOTE — Progress Notes (Signed)
   06/25/20 1407  Assess: MEWS Score  Temp 97.8 F (36.6 C)  BP 99/75  Pulse Rate (!) 129  Resp (!) 21  SpO2 97 %  O2 Device Nasal Cannula  Patient Activity (if Appropriate) In bed  O2 Flow Rate (L/min) 2 L/min  Assess: MEWS Score  MEWS Temp 0  MEWS Systolic 1  MEWS Pulse 2  MEWS RR 1  MEWS LOC 0  MEWS Score 4  MEWS Score Color Red  Assess: if the MEWS score is Yellow or Red  Were vital signs taken at a resting state? Yes  Focused Assessment No change from prior assessment  Early Detection of Sepsis Score *See Row Information* Low  MEWS guidelines implemented *See Row Information* Yes  Treat  MEWS Interventions Administered scheduled meds/treatments  Pain Scale 0-10  Pain Score 0  Take Vital Signs  Increase Vital Sign Frequency  Red: Q 1hr X 4 then Q 4hr X 4, if remains red, continue Q 4hrs  Escalate  MEWS: Escalate Red: discuss with charge nurse/RN and provider, consider discussing with RRT  Notify: Charge Nurse/RN  Name of Charge Nurse/RN Notified Christy, RN  Date Charge Nurse/RN Notified 06/25/20  Time Charge Nurse/RN Notified 1400  Notify: Provider  Provider Name/Title Damita Lack  Date Provider Notified 06/25/20  Time Provider Notified 1500  Notification Type Call  Notification Reason Change in status  Response No new orders  Date of Provider Response 06/25/20  Time of Provider Response 1508  Document  Patient Outcome Other (Comment) (Continue to monitor patient)  Progress note created (see row info) Yes

## 2020-06-26 NOTE — Progress Notes (Signed)
Pt came down to MRI w/ meds but was unable to hold still enough for diagnostic imaging. He was able to be verbally redirected but not long enough to finish one MRI sequence let alone eight.

## 2020-06-26 NOTE — Progress Notes (Signed)
   06/26/20 0420  Assess: MEWS Score  Temp 98.1 F (36.7 C)  BP 95/69  Pulse Rate (!) 108  ECG Heart Rate (!) 108  Resp 19  SpO2 96 %  O2 Device Nasal Cannula  Assess: MEWS Score  MEWS Temp 0  MEWS Systolic 1  MEWS Pulse 1  MEWS RR 0  MEWS LOC 0  MEWS Score 2  MEWS Score Color Yellow  Assess: if the MEWS score is Yellow or Red  Were vital signs taken at a resting state? Yes  Focused Assessment No change from prior assessment  Early Detection of Sepsis Score *See Row Information* Low  MEWS guidelines implemented *See Row Information* Yes  Take Vital Signs  Increase Vital Sign Frequency  Yellow: Q 2hr X 2 then Q 4hr X 2, if remains yellow, continue Q 4hrs  Escalate  MEWS: Escalate Yellow: discuss with charge nurse/RN and consider discussing with provider and RRT  Notify: Charge Nurse/RN  Name of Charge Nurse/RN Notified Christina, RN  Date Charge Nurse/RN Notified 06/26/20  Time Charge Nurse/RN Notified [phone]

## 2020-06-26 NOTE — Progress Notes (Signed)
Pt with a low hr of 58, bp-74/57, o2-96% on 2L [place]. Pt alert and oriented. Held pt's scheduled iv lasix, stopped cardizem drip and Paged provider on call Hal Hope). Per provider, give pt a 500 cc bolus, hold lasix, and stop cardizem. Orders passed on and read to dayshift nurse. Bolus was hung.

## 2020-06-26 NOTE — Progress Notes (Signed)
CRITICAL VALUE ALERT  Critical Value:  Lactic acid 2.6  Date & Time Notied:  06/26/2020  1130  Provider Notified: [place] DO  Orders Received/Actions taken: No new orders.

## 2020-06-26 NOTE — Progress Notes (Signed)
PROGRESS NOTE    Chris Morales  MRN:[id number] DOB: 12/19/1955 DOA: 06/24/2020 PCP: Patient, No Pcp Per    Brief Narrative:  Chris Morales is a 64-year-old male with past medical history notable for prostate cancer with extensive bony metastases, permanent atrial fibrillation, hypothyroidism, who presented to [facility] ED with shortness of breath, palpitations, lower extremity edema and left-sided weakness.  Over the past 9 days, he reports progressive shortness of breath that is severe; worse with exertion improved with rest.  Patient recently hospitalized [facility] in both [place] from 1017-06/24/2020.  During the hospitalization, patient was found to be in atrial fibrillation with RVR and respiratory failure requiring admission to the intensive care unit and ventilatory support.  Patient was tried on multiple AV nodal blocking agents including amiodarone, digoxin and finally was placed on scheduled metoprolol therapy.  Attempts were made at chemical cardioversion and failed.  Patient was eventually extubated and Reese lies he was suffering from left-sided upper extremity weakness and decided to leave AMA on 06/24/2020 and he took a flight from [place] to [place] where his brother lives.  Immediately after landing, patient proceeded to the [facility], ED for evaluation.  In the ED, patient noted to be in A. fib with RVR with chest x-ray revealing substantial bilateral pulmonary effusions with noted metastatic lesions to the bones.  Patient was found to have an elevated D-dimer 19.33.  Hospitalist service consulted for admission for further evaluation and treatment of severe shortness of breath, respiratory failure requiring oxygen therapy and A. fib with RVR.   Assessment & Plan:   Principal Problem:   Atrial fibrillation with rapid ventricular response (HCC) Active Problems:   Malignant neoplasm of prostate metastatic to bone (HCC)   Euthyroid sick syndrome    Left-sided weakness   Foley catheter in place on admission   Pleural effusion, bilateral   Normocytic anemia   Elevated d-dimer   Nutmeg liver   Atrial fibrillation with RVR Patient presenting with persistent A. fib with RVR.  Recent extensive hospitalization in [place] with difficult to control A. fib despite multiple AV nodal blocking agents including amiodarone, digoxin, failed chemical cardioversion.  On presentation, patient was once again in A. fib with RVR and started on a Cardizem drip. --Continue metoprolol succinate 100 mg p.o. twice daily --Digoxin 0.25 mg p.o. daily --Holding home Eliquis for anticipated IR thoracentesis --Continue to monitor on telemetry  Acute hypoxic respiratory failure 2/2 to likely malignant bilateral pleural effusions Patient presenting with progressive shortness of breath, slightly hypoxic on admission requiring 2 L nasal cannula.  Recent hospitalization requiring ventilatory support.  Chest x-ray notable for substantial bilateral pleural effusions, suspect malignant and likely etiology.  CT angiogram chest negative for pulmonary embolism.  No signs of infectious etiology. --Holding Eliquis --IR consultation for therapeutic and diagnostic thoracentesis --Continue supplemental oxygen, maintain SPO2 greater than 92%  Left-sided weakness Patient reports new left upper extremity weakness.  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  Case was discussed with neurology by previous hospitalist, unlikely dural based met versus meningioma as likely cause, and recommended further imaging with MRI C-spine.  Patient unable to tolerate MR C-spine today. --PT/OT evaluation  Prostate cancer with bony metastasis Diagnosed with prostate cancer June 2020, but has yet to receive treatment.  Imaging studies during hospitalization notable for extensive osseous metastatic disease.  Patient desires to [facility] care with [facility].  Recommend outpatient referral/evaluation with medical   oncology/urology on discharge. --Palliative care consulted given poor prognosis  Hypothyroidism versus sick euthyroid syndrome Patient with mild elevation of TSH, started on Synthroid in [place]. --TSH 7.345 --Continue levothyroxine 25 mcg p.o. 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  Iron 46, TIBC 141 (low), ferritin 590 (high), limits; consistent with anemia of chronic disease, likely secondary to cancer/metastases. --Repeat hemoglobin in a.m.  Elevated D-dimer D-dimer elevated 19.33.  CT angiogram chest negative for pulmonary embolism.  Records from [place] facility notes CT abdomen/pelvis revealing nutmeg liver concerning for portal vein thrombosis.  Ultrasound Doppler liver unremarkable.  Etiology of his elevated D-dimer likely secondary to his underlying malignancy.   DVT prophylaxis: SCDs, holding Eliquis for IR thoracentesis Code Status: Full code Family Communication: No family present at bedside  Disposition Plan:  Status is: Inpatient  Remains inpatient appropriate because:Ongoing active pain requiring inpatient pain management, Ongoing diagnostic testing needed not appropriate for outpatient work up, Unsafe d/c plan, IV treatments appropriate due to intensity of illness or inability to take PO and Inpatient level of care appropriate due to severity of illness   Dispo: The patient is from: Home              Anticipated d/c is to: To be determined              Anticipated d/c date is: 3 days              Patient currently is not medically stable to d/c.    Consultants:   Palliative care  Interventional radiology  Procedures:   None  Antimicrobials:   None   Subjective: Patient seen and examined bedside, resting comfortably.  No  specific complaints this morning.  Eating breakfast.  Continues with mild left upper extremity weakness.  Denies fever/chills/night sweats, no nausea/vomiting/diarrhea, no chest pain, no palpitations, no shortness of breath, no abdominal pain, no cough/congestion.  No acute events overnight per nursing staff.  Objective: Vitals:   06/26/20 0735 06/26/20 0800 06/26/20 1115 06/26/20 1535  BP: (!) 84/56 (!) 82/55 (!) 90/59 95/63  Pulse: 60  62 76  Resp: 20 18 20   Temp: 97.9 F (36.6 C)  98 F (36.7 C) 98.5 F (36.9 C)  TempSrc: Oral  Oral Oral  SpO2: 99% 98% 99%   Weight:      Height:        Intake/Output Summary (Last 24 hours) at 06/26/2020 1714 Last data filed at 06/26/2020 0527 Gross per 24 hour  Intake 115 ml  Output 2425 ml  Net -2310 ml   Filed Weights   06/24/20 1850 06/26/20 0420  Weight: 68 kg 70.2 kg    Examination:  General exam: Appears calm and comfortable  Respiratory system: Breath sounds decreased bilateral bases, no wheezing, no rhonchi, normal respiratory effort, on 2 L nasal cannula with SPO2 96% Cardiovascular system: S1 & S2 heard, irregularly irregular rhythm, normal rate. No JVD, murmurs, rubs, gallops or clicks.  Trace to 1+ pitting edema bilaterally Gastrointestinal system: Abdomen is nondistended, soft and nontender. No organomegaly or masses felt. Normal bowel sounds heard. Central nervous system: Alert and oriented. No focal neurological deficits. Extremities: Symmetric 5 x 5 power. Skin: No rashes, lesions or ulcers Psychiatry: Judgement and insight appear normal. Mood & affect appropriate.     Data Reviewed: I have personally reviewed following labs and imaging studies    CBC: Recent Labs  Lab 06/24/20 1852  WBC 8.4  HGB 9.0*  HCT 31.4*  MCV 94.0  PLT 232   Basic Metabolic Panel: Recent Labs  Lab 06/24/20 1852 06/25/20 1546  NA 140  --   K 3.9  --   CL 107  --   CO2 25  --   GLUCOSE 153*  --   BUN 9  --   CREATININE 0.61  --    CALCIUM 7.5*  --   MG  --  2.0   GFR: Estimated Creatinine Clearance: 92.6 mL/min (by C-G formula based on SCr of 0.61 mg/dL). Liver Function Tests: Recent Labs  Lab 06/24/20 1852  AST 49*  ALT 29  ALKPHOS 2,755*  BILITOT 0.7  PROT 5.6*  ALBUMIN 1.9*   No results for input(s): LIPASE, AMYLASE in the last 168 hours. No results for input(s): AMMONIA in the last 168 hours. Coagulation Profile: No results for input(s): INR, PROTIME in the last 168 hours. Cardiac Enzymes: No results for input(s): CKTOTAL, CKMB, CKMBINDEX, TROPONINI in the last 168 hours. BNP (last 3 results) No results for input(s): PROBNP in the last 8760 hours. HbA1C: Recent Labs    06/25/20 1546  HGBA1C 5.8*   CBG: No results for input(s): GLUCAP in the last 168 hours. Lipid Profile: Recent Labs    06/25/20 1546  CHOL 132  HDL 28*  LDLCALC 87  TRIG 87  CHOLHDL 4.7   Thyroid Function Tests: Recent Labs    06/25/20 1546  TSH 7.345*   Anemia Panel: Recent Labs    06/25/20 1546  VITAMINB12 810  FOLATE 9.6  TIBC 141*  IRON 46   Sepsis Labs: Recent Labs  Lab 06/25/20 1933 06/26/20 0735 06/26/20 1038  PROCALCITON 0.43  --   --   LATICACIDVEN  --  1.7 2.6*    Recent Results (from the past 240 hour(s))  Urine culture     Status: None   Collection Time: 06/24/20  9:57 PM   Specimen: Urine, Catheterized  Result Value Ref Range Status   Specimen Description URINE, CATHETERIZED  Final   Special Requests NONE  Final   Culture   Final    NO GROWTH Performed at  Hospital Lab, [address] [place], [place] [address]    Report Status 06/26/2020 FINAL  Final  Respiratory Panel by RT PCR (Flu A&B, Covid) - Nasopharyngeal Swab     Status: None   Collection Time: 06/25/20  4:00 PM   Specimen: Nasopharyngeal Swab  Result Value Ref Range Status   SARS Coronavirus 2 by RT PCR NEGATIVE NEGATIVE Final    Comment: (NOTE) SARS-CoV-2 target nucleic acids are NOT DETECTED.  The  SARS-CoV-2 RNA is generally detectable in upper respiratoy specimens during the acute phase of infection. The lowest concentration of SARS-CoV-2 viral copies this assay can detect is 131 copies/mL. A negative result does not preclude SARS-Cov-2 infection and should not be used as the sole basis for treatment or other patient management decisions. A negative result may occur with  improper specimen collection/handling, submission of specimen other than nasopharyngeal swab, presence of viral mutation(s) within the areas targeted by this assay, and inadequate number of viral copies (<131 copies/mL). A negative result must be combined with clinical observations, patient history, and epidemiological information. The expected result is Negative.  Fact Sheet for Patients:  https://www.fda.gov/media/142436/download  Fact Sheet for Healthcare Providers:  https://www.fda.gov/media/142435/download  This test is no t yet approved or cleared by the [place]   [place] FDA and  has been authorized for detection and/or diagnosis of SARS-CoV-2 by FDA under an Emergency Use Authorization (EUA). This EUA will remain  in effect (meaning this test can be used) for the duration of the COVID-19 declaration under Section 564(b)(1) of the Act, 21 U.S.C. section 360bbb-3(b)(1), unless the authorization is terminated or revoked sooner.     Influenza A by PCR NEGATIVE NEGATIVE Final   Influenza B by PCR NEGATIVE NEGATIVE Final    Comment: (NOTE) The Xpert Xpress SARS-CoV-2/FLU/RSV assay is intended as an aid in  the diagnosis of influenza from Nasopharyngeal swab specimens and  should not be used as a sole basis for treatment. Nasal washings and  aspirates are unacceptable for Xpert Xpress SARS-CoV-2/FLU/RSV  testing.  Fact Sheet for Patients: PinkCheek.be  Fact Sheet for Healthcare Providers: GravelBags.it  This test is not yet approved or cleared  by the [place] FDA and  has been authorized for detection and/or diagnosis of SARS-CoV-2 by  FDA under an Emergency Use Authorization (EUA). This EUA will remain  in effect (meaning this test can be used) for the duration of the  Covid-19 declaration under Section 564(b)(1) of the Act, 21  U.S.C. section 360bbb-3(b)(1), unless the authorization is  terminated or revoked. Performed at [facility], [place] [address], [place], [place] [address]   SARS CORONAVIRUS 2 (TAT 6-24 HRS) Nasopharyngeal Nasopharyngeal Swab     Status: None   Collection Time: 06/26/20 11:40 AM   Specimen: Nasopharyngeal Swab  Result Value Ref Range Status   SARS Coronavirus 2 NEGATIVE NEGATIVE Final    Comment: (NOTE) SARS-CoV-2 target nucleic acids are NOT DETECTED.  The SARS-CoV-2 RNA is generally detectable in upper and lower respiratory specimens during the acute phase of infection. Negative results do not preclude SARS-CoV-2 infection, do not rule out co-infections with other pathogens, and should not be used as the sole basis for treatment or other patient management decisions. Negative results must be combined with clinical observations, patient history, and epidemiological information. The expected result is Negative.  Fact Sheet for Patients: SugarRoll.be  Fact Sheet for Healthcare Providers: https://www.woods-mathews.com/  This test is not yet approved or cleared by the [place] FDA and  has been authorized for detection and/or diagnosis of SARS-CoV-2 by FDA under an Emergency Use Authorization (EUA). This EUA will remain  in effect (meaning this test can be used) for the duration of the COVID-19 declaration under Se ction 564(b)(1) of the Act, 21 U.S.C. section 360bbb-3(b)(1), unless the authorization is terminated or revoked sooner.  Performed at [facility], [place] [address] [place], [place] [address]          Radiology  Studies: CT ANGIO CHEST PE W OR WO CONTRAST  Result Date: 06/25/2020 CLINICAL DATA:  Worsening shortness of breath EXAM: CT ANGIOGRAPHY CHEST WITH CONTRAST TECHNIQUE: Multidetector CT imaging of the chest was performed using the standard protocol during bolus administration of intravenous contrast. Multiplanar CT image reconstructions and MIPs were obtained to evaluate the vascular anatomy. CONTRAST:  159m OMNIPAQUE IOHEXOL 350 MG/ML SOLN COMPARISON:  Radiograph same day FINDINGS: Cardiovascular: There is a optimal opacification of the pulmonary arteries. There is no central,segmental, or subsegmental filling defects within the pulmonary arteries. There is mild cardiomegaly present. No pericardial effusion or thickening. No evidence right heart strain. There is normal three-vessel brachiocephalic anatomy without proximal stenosis. The thoracic aorta is normal in appearance. Mediastinum/Nodes: No hilar, mediastinal, or axillary adenopathy. Thyroid gland, trachea, and esophagus  demonstrate no significant findings. Lungs/Pleura: There is moderate to large bilateral pleural effusion with adjacent compressive atelectasis seen. Patchy rounded ground-glass opacities are seen predominantly within the right upper lung and right middle lobe. Upper Abdomen: No acute abnormalities present in the visualized portions of the upper abdomen. There is diffuse anasarca present. Musculoskeletal: Extensive osseous sclerotic blastic lesions are seen throughout the visualized portion of the axial and appendicular skeleton. Review of the MIP images confirms the above findings. IMPRESSION: No central, segmental, or subsegmental pulmonary embolism Moderate to large bilateral pleural effusions with adjacent compressive atelectasis. Ground-glass opacities within the right upper lung and right middle lobe, likely due to infectious or inflammatory etiology. Extensive diffuse osseous metastatic disease. Electronically Signed   By: Prudencio Pair M.D.   On: 06/25/2020 03:11   MR BRAIN W WO CONTRAST  Result Date: 06/25/2020 CLINICAL DATA:  Left-sided weakness, metastatic prostate cancer EXAM: MRI HEAD WITHOUT AND WITH CONTRAST TECHNIQUE: Multiplanar, multiecho pulse sequences of the brain and surrounding structures were obtained without and with intravenous contrast. CONTRAST:  22m GADAVIST GADOBUTROL 1 MMOL/ML IV SOLN COMPARISON:  2020 FINDINGS: Brain: There is thickening and enhancement along the posterior falx measuring 5 mm in thickness. The adjacent superior sagittal sinus is uninvolved. There is new minimal adjacent T2 FLAIR hyperintensity in the right parietal lobe that may reflect associated edema. There is no acute infarction or intracranial hemorrhage. There is no parenchymal mass or mass effect. There is no hydrocephalus or extra-axial fluid collection. Prominence of the ventricles and sulci reflects minor generalized parenchymal volume loss. Patchy foci of T2 hyperintensity in the supratentorial white matter are nonspecific but probably reflect mild chronic microvascular ischemic changes. These findings have progressed since 2020. Vascular: Major vessel flow voids at the skull base are preserved. Skull and upper cervical spine: Abnormal marrow signal likely reflecting diffuse metastatic disease. Sinuses/Orbits: Chronic right maxillary sinusitis. Orbits are unremarkable. Other: Sella is unremarkable.  Bilateral mastoid effusions. IMPRESSION: No acute infarction. Thickening enhancement along the posterior falx cerebri with possible minimal associated parenchymal edema. Differential considerations include dural-based metastasis (more likely given normal appearance on prior study) and meningioma. Diffuse osseous metastatic disease. Electronically Signed   By: PMacy MisM.D.   On: 06/25/2020 08:24   DG Chest Portable 1 View  Result Date: 06/24/2020 CLINICAL DATA:  Tachycardia. EXAM: PORTABLE CHEST 1 VIEW COMPARISON:  February 19, 2019  FINDINGS: There is cardiomegaly. There are moderate to large bilateral pleural effusions. There is bibasilar atelectasis. There is diffuse sclerosis throughout the visualized osseous structures most notably the thoracic spine. There is no pneumothorax. IMPRESSION: 1. Moderate to large bilateral pleural effusions with adjacent atelectasis. 2. Cardiomegaly. 3. Progressive sclerosis throughout the visualized osseous structures consistent with metastatic disease. Electronically Signed   By: CConstance HolsterM.D.   On: 06/24/2020 21:33   [place]IVER DOPPLER  Result Date: 06/25/2020 CLINICAL DATA:  64year old male with a history of liver disease EXAM: DUPLEX ULTRASOUND OF LIVER TECHNIQUE: Color and duplex Doppler ultrasound was performed to evaluate the hepatic in-flow and out-flow vessels. COMPARISON:  None. FINDINGS: Portal Vein Velocities Main:  43 cm/sec Right:  28 cm/sec Left:  18 cm/sec Hepatic Vein Velocities Right:  77 cm/sec Middle:  78 cm/sec Left:  92 cm/sec Hepatic Artery Velocity:  135 cm/sec Splenic Vein Velocity:  24 cm/sec Varices: Absent Ascites: Absent Spleen volume estimated 52 cubic cm Pleural fluid bilaterally IMPRESSION: Unremarkable duplex of the hepatic vasculature. Bilateral pleural effusions Electronically Signed   By: JYork Cerise  Earleen Newport D.O.   On: 06/25/2020 14:47   ECHOCARDIOGRAM COMPLETE BUBBLE STUDY  Result Date: 06/25/2020    ECHOCARDIOGRAM REPORT   Patient Name:   Chris Morales Date of Exam: 06/25/2020 Medical Rec #:  [id number]    Height:       73.0 in Accession #:    [id number]   Weight:       150.0 lb Date of Birth:  [birth year]    BSA:          1.904 m Patient Age:    4 years     BP:           111/75 mmHg Patient Gender: M            HR:           126 bpm. Exam Location:  Inpatient Procedure: 2D Echo, Cardiac Doppler, Color Doppler and Saline Contrast Bubble            Study Indications:    Atrial fibrillation with rapid ventricular response ([place])  History:        Patient has no  prior history of Echocardiogram examinations.  Sonographer:    Bernadene Person RDCS Referring Phys: [id number] [place]  1. Left ventricular ejection fraction, by estimation, is 50 to 55%. The left ventricle has low normal function. The left ventricle has no regional wall motion abnormalities. Left ventricular diastolic parameters were normal. There is the interventricular septum is flattened in diastole ('D' shaped left ventricle), consistent with right ventricular volume overload.  2. Right ventricular systolic function is mildly reduced. The right ventricular size is mildly enlarged. There is mildly elevated pulmonary artery systolic pressure. The estimated right ventricular systolic pressure is 81.1 mmHg.  3. Left atrial size was mildly dilated.  4. Right atrial size was mildly dilated.  5. Large pleural effusion in the left lateral region.  6. The mitral valve is normal in structure. Mild mitral valve regurgitation.  7. Tricuspid valve regurgitation is mild to moderate.  8. The aortic valve is normal in structure. Aortic valve regurgitation is not visualized. Mild aortic valve sclerosis is present, with no evidence of aortic valve stenosis.  9. The inferior vena cava is dilated in size with <50% respiratory variability, suggesting right atrial pressure of 15 mmHg. 10. Evidence of atrial level shunting detected by color flow Doppler. Agitated saline contrast bubble study was positive with shunting observed within 3-6 cardiac cycles suggestive of interatrial shunt. Comparison(s): No prior Echocardiogram. Findings are suggestive of right heart volume overload due to left-to-right shunt and there is a small right to left interatrial shunt by saline contrast study. However, an atrial septal defect could not be outlined with 2D and color Doppler imaging. Consider TEE or CT angiography to evaluate for anormalous pulmonary vein return or occult ASD. Qp:Qs shunt fraction could not be accurately  calculated due to poor quality RV outflow imaging. FINDINGS  Left Ventricle: Left ventricular ejection fraction, by estimation, is 50 to 55%. The left ventricle has low normal function. The left ventricle has no regional wall motion abnormalities. The left ventricular internal cavity size was normal in size. There is no left ventricular hypertrophy. The interventricular septum is flattened in diastole ('D' shaped left ventricle), consistent with right ventricular volume overload. Left ventricular diastolic parameters were normal. Normal left ventricular filling pressure. Right Ventricle: The right ventricular size is mildly enlarged. No increase in right ventricular wall thickness. Right ventricular systolic function is mildly reduced. There is mildly elevated pulmonary  artery systolic pressure. The tricuspid regurgitant  velocity is 2.60 m/s, and with an assumed right atrial pressure of 15 mmHg, the estimated right ventricular systolic pressure is 85.9 mmHg. Left Atrium: Left atrial size was mildly dilated. Right Atrium: Right atrial size was mildly dilated. Pericardium: There is no evidence of pericardial effusion. Mitral Valve: The mitral valve is normal in structure. Mild mitral valve regurgitation. Tricuspid Valve: The tricuspid valve is grossly normal. Tricuspid valve regurgitation is mild to moderate. Aortic Valve: The aortic valve is normal in structure. Aortic valve regurgitation is not visualized. Mild aortic valve sclerosis is present, with no evidence of aortic valve stenosis. Pulmonic Valve: The pulmonic valve was not well visualized. Pulmonic valve regurgitation is not visualized. Aorta: The aortic root and ascending aorta are structurally normal, with no evidence of dilitation. Venous: The inferior vena cava is dilated in size with less than 50% respiratory variability, suggesting right atrial pressure of 15 mmHg. IAS/Shunts: There is redundancy of the interatrial septum. Evidence of atrial level  shunting detected by color flow Doppler. Agitated saline contrast was given intravenously to evaluate for intracardiac shunting. Agitated saline contrast bubble study was  positive with shunting observed within 3-6 cardiac cycles suggestive of interatrial shunt. Additional Comments: There is a large pleural effusion in the left lateral region.  LEFT VENTRICLE PLAX 2D LVIDd:         4.10 cm     Diastology LVIDs:         3.00 cm     LV e' medial:    13.40 cm/s LV PW:         1.00 cm     LV E/e' medial:  6.5 LV IVS:        0.90 cm     LV e' lateral:   9.00 cm/s LVOT diam:     2.10 cm     LV E/e' lateral: 9.7 LV SV:         68 LV SV Index:   36 LVOT Area:     3.46 cm  LV Volumes (MOD) LV vol d, MOD A2C: 63.8 ml LV vol d, MOD A4C: 58.6 ml LV vol s, MOD A2C: 32.5 ml LV vol s, MOD A4C: 27.6 ml LV SV MOD A2C:     31.3 ml LV SV MOD A4C:     58.6 ml LV SV MOD BP:      32.7 ml RIGHT VENTRICLE RV S prime:     6.85 cm/s TAPSE (M-mode): 1.7 cm LEFT ATRIUM             Index       RIGHT ATRIUM           Index LA diam:        4.00 cm 2.10 cm/m  RA Area:     21.40 cm LA Vol (A2C):   37.5 ml 19.70 ml/m RA Volume:   61.60 ml  32.35 ml/m LA Vol (A4C):   53.1 ml 27.89 ml/m LA Biplane Vol: 44.8 ml 23.53 ml/m  AORTIC VALVE             PULMONIC VALVE LVOT Vmax:   89.40 cm/s  RVOT Peak grad: 2 mmHg LVOT Vmean:  65.500 cm/s LVOT VTI:    0.197 m  AORTA Ao Root diam: 2.70 cm Ao Asc diam:  2.90 cm MITRAL VALVE               TRICUSPID VALVE MV Area (PHT): 3.77 cm  TR Peak grad:   27.0 mmHg MV Decel Time: 201 msec    TR Vmax:        260.00 cm/s MV E velocity: 87.50 cm/s MV A velocity: 90.80 cm/s  SHUNTS MV E/A ratio:  0.96        Systemic VTI:  0.20 m                            Systemic Diam: 2.10 cm                            Pulmonic VTI:  0.087 m Dani Gobble Croitoru MD Electronically signed by Sanda Klein MD Signature Date/Time: 06/25/2020/12:54:37 PM    Final         Scheduled Meds: . Chlorhexidine Gluconate Cloth  6 each  Topical Daily  . digoxin  0.25 mg Oral Daily  . levothyroxine  25 mcg Oral QAC breakfast  . metoprolol succinate  100 mg Oral BID   Continuous Infusions:   LOS: 1 day    Time spent: 39 minutes spent on chart review, discussion with nursing staff, consultants, updating family and interview/physical exam; more than 50% of that time was spent in counseling and/or coordination of care.    Tregan Read J [place] DO [facility] Available via Epic secure chat 7am-7pm After these hours, please refer to coverage provider listed on [email or web address] 06/26/2020, 5:14 PM

## 2020-06-26 NOTE — Plan of Care (Signed)

## 2020-06-27 ENCOUNTER — Inpatient Hospital Stay (HOSPITAL_COMMUNITY): Payer: PRIVATE HEALTH INSURANCE

## 2020-06-27 DIAGNOSIS — R531 Weakness: Secondary | ICD-10-CM | POA: Diagnosis not present

## 2020-06-27 DIAGNOSIS — Z789 Other specified health status: Secondary | ICD-10-CM

## 2020-06-27 DIAGNOSIS — C61 Malignant neoplasm of prostate: Secondary | ICD-10-CM | POA: Diagnosis not present

## 2020-06-27 DIAGNOSIS — I4891 Unspecified atrial fibrillation: Secondary | ICD-10-CM | POA: Diagnosis not present

## 2020-06-27 DIAGNOSIS — Z515 Encounter for palliative care: Secondary | ICD-10-CM

## 2020-06-27 DIAGNOSIS — Z7189 Other specified counseling: Secondary | ICD-10-CM

## 2020-06-27 DIAGNOSIS — R7989 Other specified abnormal findings of blood chemistry: Secondary | ICD-10-CM | POA: Diagnosis not present

## 2020-06-27 HISTORY — PX: IR THORACENTESIS ASP PLEURAL SPACE W/IMG GUIDE: IMG5380

## 2020-06-27 LAB — PROTEIN, PLEURAL OR PERITONEAL FLUID: Total protein, fluid: <3 g/dL

## 2020-06-27 LAB — GRAM STAIN

## 2020-06-27 LAB — BODY FLUID CELL COUNT WITH DIFFERENTIAL
Lymphs, Fluid: 40 %
Monocyte-Macrophage-Serous Fluid: 8 % — ABNORMAL LOW (ref 50–90)
Neutrophil Count, Fluid: 52 % — ABNORMAL HIGH (ref 0–25)
Total Nucleated Cell Count, Fluid: 275 cu mm (ref 0–1000)

## 2020-06-27 LAB — ALBUMIN, PLEURAL OR PERITONEAL FLUID: Albumin, Fluid: <1 g/dL

## 2020-06-27 LAB — PROTEIN, TOTAL: Total Protein: 5.3 g/dL — ABNORMAL LOW (ref 6.5–8.1)

## 2020-06-27 LAB — LACTATE DEHYDROGENASE, PLEURAL OR PERITONEAL FLUID: LD, Fluid: 140 U/L — ABNORMAL HIGH (ref 3–23)

## 2020-06-27 LAB — GLUCOSE, PLEURAL OR PERITONEAL FLUID: Glucose, Fluid: 111 mg/dL

## 2020-06-27 LAB — LACTATE DEHYDROGENASE: LDH: 270 U/L — ABNORMAL HIGH (ref 98–192)

## 2020-06-27 LAB — LACTIC ACID, PLASMA: Lactic Acid, Venous: 1.2 mmol/L (ref 0.5–1.9)

## 2020-06-27 MED ORDER — LIDOCAINE HCL (PF) 1 % IJ SOLN
INTRAMUSCULAR | Status: DC | PRN
  Administered 2020-06-27: 15 mL

## 2020-06-27 MED ORDER — LIDOCAINE HCL 1 % IJ SOLN
INTRAMUSCULAR | Status: AC
  Filled 2020-06-27: qty 20

## 2020-06-27 NOTE — Progress Notes (Signed)
Rehab Admissions Coordinator Note:  Patient was screened by Cleatrice Burke for appropriateness for an Inpatient Acute Rehab Consult per therapy recommendations.   At this time, we are recommending Inpatient Rehab consult. I will place order per protocol.  Cleatrice Burke RN MSN 06/27/2020, 4:18 PM  I can be reached at [phone].

## 2020-06-27 NOTE — Evaluation (Signed)
Physical Therapy Evaluation Patient Details Name: Chris Morales MRN: [id number] DOB: 11/21/1955 Today's Date: 06/27/2020   History of Present Illness  Pt adm with afib with rvr, acute hypoxic respiratory failure likely secondary to malignant bil pleural effusions, and lt UE weakness. Pt had recently been hospitalized in [place] with afib with rvr and resp failure requiring vent support and then left AMA before flying back to [facility] and presenting immediately to ED. 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 PMH - prostate CA with bony mets, C3-4 laminectomy with tumor removal 01/2019, afib  Clinical Impression  Pt presents to PT with decr balance and weakness as well as cognitive deficits. Unable to get a good grasp on possible plans at dc (Is pt going to brother's home - he told OT he had no family here) and how much support he has. Currently would ask CIR to take a look at. Will continue to assess dc plans.     Follow Up Recommendations CIR (May change with progress - better understand of dc location)    Equipment Recommendations  Rolling walker with 5" wheels    Recommendations for Other Services       Precautions / Restrictions Precautions Precautions: Fall Restrictions Weight Bearing Restrictions: No      Mobility  Bed Mobility Overal bed mobility: Needs Assistance Bed Mobility: Supine to Sit     Supine to sit: Min assist Sit to supine: Min assist   General bed mobility comments: Assist to elevate trunk into sitting and bringing hips to EOB    Transfers Overall transfer level: Needs assistance Equipment used: Rolling walker (2 wheeled) Transfers: Sit to/from Stand Sit to Stand: Mod assist         General transfer comment: Assist to bring hips up from low surface.   Ambulation/Gait Ambulation/Gait assistance: Min assist Gait Distance (Feet): 30 Feet Assistive device: Rolling walker (2 wheeled) Gait Pattern/deviations: Step-through  pattern;Decreased stride length;Narrow base of support Gait velocity: decr Gait velocity interpretation: <1.31 ft/sec, indicative of household ambulator General Gait Details: Assist for balance and support  Stairs            Wheelchair Mobility    Modified Rankin (Stroke Patients Only)       Balance Overall balance assessment: Needs assistance Sitting-balance support: Feet supported Sitting balance-Leahy Scale: Fair     Standing balance support: Bilateral upper extremity supported;During functional activity Standing balance-Leahy Scale: Poor Standing balance comment: walker and min guard for static standing                             Pertinent Vitals/Pain Pain Assessment: No/denies pain    Home Living Family/patient expects to be discharged to:: Private residence Living Arrangements: Alone Available Help at Discharge: Family;Available PRN/intermittently Type of Home: House       Home Layout: One level Home Equipment: None      Prior Function Level of Independence: Independent               Hand Dominance   Dominant Hand: Right    Extremity/Trunk Assessment   Upper Extremity Assessment Upper Extremity Assessment: Defer to OT evaluation RUE Deficits / Details: pt reports his "rotator cuff is shot" 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 Pt states he fell asleep on the arm in a car and it went numb. Noted wrsit drop and inability to flex shoulder actively LUE Deficits / Details: diffuse weakness LUE Coordination: decreased  fine motor;decreased gross motor    Lower Extremity Assessment Lower Extremity Assessment: Generalized weakness       Communication   Communication: No difficulties  Cognition Arousal/Alertness: Awake/alert Behavior During Therapy: Flat affect Overall Cognitive Status: Impaired/Different from baseline Area of Impairment: Orientation;Following commands;Safety/judgement;Awareness;Problem solving                  Orientation Level: Situation (telling OT he has no family around when he has two family members coming to visit today)     Following Commands: Follows one step commands with increased time Safety/Judgement: Decreased awareness of safety;Decreased awareness of deficits Awareness: Emergent Problem Solving: Slow processing;Requires verbal cues;Requires tactile cues General Comments: pt presenting with flat affect, disoriented to current situation. Requires increased time and cues to process basic tasks      General Comments General comments (skin integrity, edema, etc.): HR to 122 with amb. Was 47 at rest    Exercises     Assessment/Plan    PT Assessment Patient needs continued PT services  PT Problem List Decreased strength;Decreased activity tolerance;Decreased balance;Decreased mobility;Decreased cognition;Decreased knowledge of use of DME;Decreased safety awareness       PT Treatment Interventions DME instruction;Gait training;Stair training;Functional mobility training;Therapeutic activities;Therapeutic exercise;Balance training;Cognitive remediation;Patient/family education    PT Goals (Current goals can be found in the Care Plan section)  Acute Rehab PT Goals Patient Stated Goal: return to independence PT Goal Formulation: With patient Time For Goal Achievement: 07/11/20 Potential to Achieve Goals: Good    Frequency Min 3X/week   Barriers to discharge Decreased caregiver support Unsure of home support or where he plans to DC    Co-evaluation               AM-PAC PT "6 Clicks" Mobility  Outcome Measure Help needed turning from your back to your side while in a flat bed without using bedrails?: A Little Help needed moving from lying on your back to sitting on the side of a flat bed without using bedrails?: A Little Help needed moving to and from a bed to a chair (including a wheelchair)?: A Lot Help needed standing up from a chair using your arms (e.g.,  wheelchair or bedside chair)?: A Lot Help needed to walk in hospital room?: A Little Help needed climbing 3-5 steps with a railing? : A Lot 6 Click Score: 15    End of Session Equipment Utilized During Treatment: Gait belt Activity Tolerance: Patient limited by fatigue Patient left: in chair;with call bell/phone within reach;with chair alarm set Nurse Communication: Mobility status PT Visit Diagnosis: Unsteadiness on feet (R26.81);Muscle weakness (generalized) (M62.81)    Time: 9629-5284 PT Time Calculation (min) (ACUTE ONLY): 21 min   Charges:   PT Evaluation $PT Eval Moderate Complexity: [place] Pager [phone] Office [place] 06/27/2020, 3:04 PM

## 2020-06-27 NOTE — Progress Notes (Addendum)
This chaplain responded to PMT consult for spiritual care: prayer and Advance Directive.  The Pt. is alert and sitting up bedside.  A medical team member joins, with the Pt. Permission, the Pt. and chaplain in the discussion of the Pt. AD.  The Pt. has completed HCPOA education and chooses to wait for more medical information before proceeding with choice of HCPOA.  The Pt. declined education on a Living Will at this time.  Information on how to contact a chaplain was shared with the Pt.  This chaplain will F/U with the Pt. on Monday.  *AD document was left in Pt. Room.

## 2020-06-27 NOTE — Progress Notes (Signed)
Patient back to room from IR. VS stable and patient free from pain.

## 2020-06-27 NOTE — Progress Notes (Signed)
PT Cancellation Note  Patient Details Name: Chris Morales MRN: [id number] DOB: [birth year]   Cancelled Treatment:    Reason Eval/Treat Not Completed: Patient at procedure or test/unavailable. Pt off the floor for test. Will try again later.    Shary Decamp [facility] 06/27/2020, 11:48 AM [place] Pager [phone] Office [phone]

## 2020-06-27 NOTE — Progress Notes (Signed)
PROGRESS NOTE    Chris Morales  FVO:[id number] DOB: [birth year] DOA: 06/24/2020 PCP: Patient, No Pcp Per    Brief Narrative:  Chris Morales is a 64 year old male with past medical history notable for prostate cancer with extensive bony metastases, permanent atrial fibrillation, hypothyroidism, who presented to [facility] ED with shortness of breath, palpitations, lower extremity edema and left-sided weakness.  Over the past 9 days, he reports progressive shortness of breath that is severe; worse with exertion improved with rest.  Patient recently hospitalized mid [facility] health in both [place] from 1017-06/24/2020.  During the hospitalization, patient was found to be in atrial fibrillation with RVR and respiratory failure requiring admission to the intensive care unit and ventilatory support.  Patient was tried on multiple AV nodal blocking agents including amiodarone, digoxin and finally was placed on scheduled metoprolol therapy.  Attempts were made at chemical cardioversion and failed.  Patient was eventually extubated and Chris Morales lies he was suffering from left-sided upper extremity weakness and decided to leave AMA on 06/24/2020 and he took a flight from [place] to [place] where his brother lives.  Immediately after landing, patient proceeded to the [name] ED for evaluation.  In the ED, patient noted to be in A. fib with RVR with chest x-ray revealing substantial bilateral pulmonary effusions with noted metastatic lesions to the bones.  Patient was found to have an elevated D-dimer 19.33.  Hospitalist service consulted for admission for further evaluation and treatment of severe shortness of breath, respiratory failure requiring oxygen therapy and A. fib with RVR.   Assessment & Plan:   Principal Problem:   Atrial fibrillation with rapid ventricular response (HCC) Active Problems:   Malignant neoplasm of prostate metastatic to bone [facility])   Euthyroid sick syndrome    Left-sided weakness   Foley catheter in place on admission   Pleural effusion, bilateral   Normocytic anemia   Elevated d-dimer   Nutmeg liver   Atrial fibrillation with RVR Patient presenting with persistent A. fib with RVR.  Recent extensive hospitalization in [place] with difficult to control A. fib despite multiple AV nodal blocking agents including amiodarone, digoxin, failed chemical cardioversion.  On presentation, patient was once again in A. fib with RVR and started on a Cardizem drip. --Continue metoprolol succinate 100 mg p.o. BID, hold for SBP<90 or HR <60 --Digoxin 0.25 mg p.o. daily --Continue to hold home Eliquis for possible need for left thoracentesis --Continue to monitor on telemetry  Acute hypoxic respiratory failure 2/2 to decompensated diastolic congestive heart failure with likely malignant bilateral pleural effusions Patient presenting with progressive shortness of breath, slightly hypoxic on admission requiring 2 L nasal cannula.  Recent hospitalization requiring ventilatory support.  Chest x-ray notable for substantial bilateral pleural effusions, suspect malignant and likely etiology.  CT angiogram chest negative for pulmonary embolism.  No signs of infectious etiology.  TTE 06/25/2020 with LVEF 50-55%, no LV wall motion abnormalities, LA mildly dilated, RA mildly dilated, large pleural effusion on left, moderate TR, mild MR, IVC dilated. Underwent IR ultrasound-guided thoracentesis on 06/27/2020 with 1.6 L dark yellow fluid removed. --Holding Eliquis --Pending pleural fluid Gram stain, culture, cytology, LDH, total protein --Continue supplemental oxygen, maintain SPO2 greater than 92%; on 2 L nasal cannula  Left-sided weakness Patient reports new left upper extremity weakness.  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  Case was discussed with neurology by previous hospitalist, unlikely dural based met versus meningioma as likely  cause, and recommended further imaging with MRI C-spine.  Patient unable to tolerate MR C-spine today once again. --Pending PT/OT evaluation  Prostate cancer with bony metastasis Diagnosed with prostate cancer June 2020, but has yet to receive treatment.  Imaging studies during hospitalization notable for extensive osseous metastatic disease.  Patient desires to establish care with [facility].  Recommend outpatient referral/evaluation with medical oncology/urology on discharge. --Palliative care consulted given poor prognosis  Hypothyroidism versus sick euthyroid syndrome Patient with mild elevation of TSH, started on Synthroid in [place]. --TSH 7.345 --Continue levothyroxine 25 mcg p.o. 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  Iron 46, TIBC 141 (low), ferritin 590 (high); consistent with anemia of chronic disease, likely secondary to cancer/metastases. --Repeat hemoglobin in a.m.  Elevated D-dimer D-dimer elevated 19.33.  CT angiogram chest negative for pulmonary embolism.  Records from [place] facility notes CT abdomen/pelvis revealing nutmeg liver concerning for portal vein thrombosis.  Ultrasound Doppler liver unremarkable.  Etiology of his elevated D-dimer likely secondary to his underlying malignancy.   DVT prophylaxis: SCDs, holding Eliquis for IR thoracentesis Code Status: Full code Family Communication: No family present at bedside  Disposition Plan:  Status is: Inpatient  Remains inpatient appropriate because:Ongoing active pain requiring inpatient pain management, Ongoing diagnostic testing needed not appropriate for outpatient work up, Unsafe d/c plan, IV treatments appropriate due to intensity of illness or inability to take PO and Inpatient level of care appropriate due to severity of  illness   Dispo: The patient is from: Home              Anticipated d/c is to: To be determined              Anticipated d/c date is: 3 days              Patient currently is not medically stable to d/c.    Consultants:   Palliative care  Interventional radiology  Procedures:   None  Antimicrobials:   None   Subjective: Patient seen and examined bedside, resting comfortably.  No specific complaints this morning.  Pending IR thoracentesis today.  Denies fever/chills/night sweats, no nausea/vomiting/diarrhea, no chest pain, no palpitations, no shortness of breath, no abdominal pain, no cough/congestion.  No acute events overnight per nursing staff.  Objective: Vitals:   06/27/20 0500 06/27/20 0833 06/27/20 0959 06/27/20 1210  BP:  138/82  94/64  Pulse:  (!) 116  98  Resp:  (!) 21 (!) 21 (!) 24  Temp:  98.6 F (37 C)  98.6 F (37 C)  TempSrc:  Oral  Oral  SpO2:  100% 99%   Weight: 70.5 kg     Height:        Intake/Output Summary (Last 24 hours) at 06/27/2020 1400 Last data filed at 06/27/2020 0449 Gross per 24 hour  Intake --  Output 650 ml  Net -650 ml   Filed Weights   06/24/20 1850 06/26/20 0420 06/27/20 0500  Weight: 68 kg 70.2 kg 70.5 kg    Examination:  General exam: Appears calm and comfortable  Respiratory system: Breath sounds decreased bilateral bases, no wheezing, no rhonchi, normal respiratory effort, on 2 L nasal cannula with SPO2 96% Cardiovascular system: S1 & S2 heard, irregularly irregular rhythm, normal rate. No JVD, murmurs, rubs, gallops or clicks.  Trace to 1+ pitting edema bilaterally Gastrointestinal  system: Abdomen is nondistended, soft and nontender. No organomegaly or masses felt. Normal bowel sounds heard. Central nervous system: Alert and oriented. No focal neurological deficits. Extremities: Symmetric 5 x 5 power. Skin: No rashes, lesions or ulcers Psychiatry: Judgement and insight appear normal. Mood & affect appropriate.      Data Reviewed: I have personally reviewed following labs and imaging studies  CBC: Recent Labs  Lab 06/24/20 1852  WBC 8.4  HGB 9.0*  HCT 31.4*  MCV 94.0  PLT 509   Basic Metabolic Panel: Recent Labs  Lab 06/24/20 1852 06/25/20 1546  NA 140  --   K 3.9  --   CL 107  --   CO2 25  --   GLUCOSE 153*  --   BUN 9  --   CREATININE 0.61  --   CALCIUM 7.5*  --   MG  --  2.0   GFR: Estimated Creatinine Clearance: 93 mL/min (by C-G formula based on SCr of 0.61 mg/dL). Liver Function Tests: Recent Labs  Lab 06/24/20 1852  AST 49*  ALT 29  ALKPHOS 2,755*  BILITOT 0.7  PROT 5.6*  ALBUMIN 1.9*   No results for input(s): LIPASE, AMYLASE in the last 168 hours. No results for input(s): AMMONIA in the last 168 hours. Coagulation Profile: No results for input(s): INR, PROTIME in the last 168 hours. Cardiac Enzymes: No results for input(s): CKTOTAL, CKMB, CKMBINDEX, TROPONINI in the last 168 hours. BNP (last 3 results) No results for input(s): PROBNP in the last 8760 hours. HbA1C: Recent Labs    06/25/20 1546  HGBA1C 5.8*   CBG: No results for input(s): GLUCAP in the last 168 hours. Lipid Profile: Recent Labs    06/25/20 1546  CHOL 132  HDL 28*  LDLCALC 87  TRIG 87  CHOLHDL 4.7   Thyroid Function Tests: Recent Labs    06/25/20 1546  TSH 7.345*   Anemia Panel: Recent Labs    06/25/20 1546  VITAMINB12 810  FOLATE 9.6  TIBC 141*  IRON 46   Sepsis Labs: Recent Labs  Lab 06/25/20 1933 06/26/20 0735 06/26/20 1038 06/27/20 0202  PROCALCITON 0.43  --   --   --   LATICACIDVEN  --  1.7 2.6* 1.2    Recent Results (from the past 240 hour(s))  Urine culture     Status: None   Collection Time: 06/24/20  9:57 PM   Specimen: Urine, Catheterized  Result Value Ref Range Status   Specimen Description URINE, CATHETERIZED  Final   Special Requests NONE  Final   Culture   Final    NO GROWTH Performed at [facility], [place] [address],  [place], [place] [address]    Report Status 06/26/2020 FINAL  Final  Respiratory Panel by RT PCR (Flu A&B, Covid) - Nasopharyngeal Swab     Status: None   Collection Time: 06/25/20  4:00 PM   Specimen: Nasopharyngeal Swab  Result Value Ref Range Status   SARS Coronavirus 2 by RT PCR NEGATIVE NEGATIVE Final    Comment: (NOTE) SARS-CoV-2 target nucleic acids are NOT DETECTED.  The SARS-CoV-2 RNA is generally detectable in upper respiratoy specimens during the acute phase of infection. The lowest concentration of SARS-CoV-2 viral copies this assay can detect is 131 copies/mL. A negative result does not preclude SARS-Cov-2 infection and should not be used as the sole basis for treatment or other patient management decisions. A negative result may occur with  improper specimen collection/handling, submission of specimen  other than nasopharyngeal swab, presence of viral mutation(s) within the areas targeted by this assay, and inadequate number of viral copies (<131 copies/mL). A negative result must be combined with clinical observations, patient history, and epidemiological information. The expected result is Negative.  Fact Sheet for Patients:  PinkCheek.be  Fact Sheet for Healthcare Providers:  GravelBags.it  This test is no t yet approved or cleared by the [place] FDA and  has been authorized for detection and/or diagnosis of SARS-CoV-2 by FDA under an Emergency Use Authorization (EUA). This EUA will remain  in effect (meaning this test can be used) for the duration of the COVID-19 declaration under Section 564(b)(1) of the Act, 21 U.S.C. section 360bbb-3(b)(1), unless the authorization is terminated or revoked sooner.     Influenza A by PCR NEGATIVE NEGATIVE Final   Influenza B by PCR NEGATIVE NEGATIVE Final    Comment: (NOTE) The Xpert Xpress SARS-CoV-2/FLU/RSV assay is intended as an aid in  the diagnosis of  influenza from Nasopharyngeal swab specimens and  should not be used as a sole basis for treatment. Nasal washings and  aspirates are unacceptable for Xpert Xpress SARS-CoV-2/FLU/RSV  testing.  Fact Sheet for Patients: PinkCheek.be  Fact Sheet for Healthcare Providers: GravelBags.it  This test is not yet approved or cleared by the [place] FDA and  has been authorized for detection and/or diagnosis of SARS-CoV-2 by  FDA under an Emergency Use Authorization (EUA). This EUA will remain  in effect (meaning this test can be used) for the duration of the  Covid-19 declaration under Section 564(b)(1) of the Act, 21  U.S.C. section 360bbb-3(b)(1), unless the authorization is  terminated or revoked. Performed at [facility], [place] [address], [place], [place] [address]   SARS CORONAVIRUS 2 (TAT 6-24 HRS) Nasopharyngeal Nasopharyngeal Swab     Status: None   Collection Time: 06/26/20 11:40 AM   Specimen: Nasopharyngeal Swab  Result Value Ref Range Status   SARS Coronavirus 2 NEGATIVE NEGATIVE Final    Comment: (NOTE) SARS-CoV-2 target nucleic acids are NOT DETECTED.  The SARS-CoV-2 RNA is generally detectable in upper and lower respiratory specimens during the acute phase of infection. Negative results do not preclude SARS-CoV-2 infection, do not rule out co-infections with other pathogens, and should not be used as the sole basis for treatment or other patient management decisions. Negative results must be combined with clinical observations, patient history, and epidemiological information. The expected result is Negative.  Fact Sheet for Patients: SugarRoll.be  Fact Sheet for Healthcare Providers: https://www.woods-mathews.com/  This test is not yet approved or cleared by the [place] FDA and  has been authorized for detection and/or diagnosis of SARS-CoV-2 by FDA  under an Emergency Use Authorization (EUA). This EUA will remain  in effect (meaning this test can be used) for the duration of the COVID-19 declaration under Se ction 564(b)(1) of the Act, 21 U.S.C. section 360bbb-3(b)(1), unless the authorization is terminated or revoked sooner.  Performed at [facility], [place] [address], [place], [place] [address]          Radiology Studies: DG Chest 1 View  Result Date: 06/27/2020 CLINICAL DATA:  Post right thoracentesis. EXAM: CHEST  1 VIEW COMPARISON:  06/24/2020 FINDINGS: Interval improvement in right pleural effusion. No pneumothorax. Improved aeration right lung base Interval progression of left pleural effusion and left lower lobe atelectasis Extensive sclerotic bony changes compatible with metastatic prostate cancer. IMPRESSION: Improved right pleural effusion with no pneumothorax post thoracentesis  Progression of left effusion and left lower lobe atelectasis. Bony metastatic disease. Electronically Signed   By: Franchot Gallo M.D.   On: 06/27/2020 12:06   US LIVER DOPPLER  Result Date: 06/25/2020 CLINICAL DATA:  64 year old male with a history of liver disease EXAM: DUPLEX ULTRASOUND OF LIVER TECHNIQUE: Color and duplex Doppler ultrasound was performed to evaluate the hepatic in-flow and out-flow vessels. COMPARISON:  None. FINDINGS: Portal Vein Velocities Main:  43 cm/sec Right:  28 cm/sec Left:  18 cm/sec Hepatic Vein Velocities Right:  77 cm/sec Middle:  78 cm/sec Left:  92 cm/sec Hepatic Artery Velocity:  135 cm/sec Splenic Vein Velocity:  24 cm/sec Varices: Absent Ascites: Absent Spleen volume estimated 52 cubic cm Pleural fluid bilaterally IMPRESSION: Unremarkable duplex of the hepatic vasculature. Bilateral pleural effusions Electronically Signed   By: Corrie Mckusick D.O.   On: 06/25/2020 14:47        Scheduled Meds: . Chlorhexidine Gluconate Cloth  6 each Topical Daily  . digoxin  0.25 mg Oral Daily  . levothyroxine  25 mcg  Oral QAC breakfast  . lidocaine      . metoprolol succinate  100 mg Oral BID   Continuous Infusions:   LOS: 2 days    Time spent: 39 minutes spent on chart review, discussion with nursing staff, consultants, updating family and interview/physical exam; more than 50% of that time was spent in counseling and/or coordination of care.    Jarrad Mclees J [place] DO [facility] Available via Epic secure chat 7am-7pm After these hours, please refer to coverage provider listed on [email or web address] 06/27/2020, 2:00 PM

## 2020-06-27 NOTE — Procedures (Signed)
PROCEDURE SUMMARY:  Successful US guided right thoracentesis. Yielded 1.6 L of dark yellow fluid. Pt tolerated procedure well. No immediate complications.  Specimen sent for labs. CXR ordered; no post-procedure pneumothorax identified.  EBL < 2 mL  Theresa Duty, NP 06/27/2020 12:29 PM

## 2020-06-27 NOTE — Consult Note (Signed)
                                                                                 Consultation Note Date: 06/27/2020   Patient Name: Chris Morales  DOB: 06/15/1956  MRN: [id number]  Age / Sex: 64 y.o., male  PCP: Patient, No Pcp Per Referring Physician: Austria, Eric J, DO  Reason for Consultation: Establishing goals of care  HPI/Patient Profile: 64 y.o. male  with past medical history of prostate cancer with extensive bony metastases, atrial fibrillation, hypothyroidism admitted on 06/24/2020 with atrial fibrillation with RVR and bilateral pulmoary effusions.   Patient recently hospitalized [facility] in both [place] from 1017-06/24/2020.  During the hospitalization, patient was found to be in atrial fibrillation with RVR and respiratory failure requiring admission to the intensive care unit and ventilatory support. Patient was eventually extubated and subsequently was suffering from left-sided upper extremity weakness and decided to leave AMA on 06/24/2020 and he took a flight from [place] to [place] where his brother lives.  Immediately after landing, patient proceeded to the [facility], ED for evaluation.  Patient faces treatment option decisions, advanced directive decisions, and anticipatory care needs.   Clinical Assessment and Goals of Care: I have reviewed medical records including EPIC notes, labs, and imaging. Received report from primary RN - no acute concerns.   Went to visit patient at bedside - no family/visitors present. Patient was lying in bed awake, alert, oriented, and able to participate in conversation. No signs or non-verbal gestures of pain or discomfort noted. No respiratory distress, increased work of breathing, or secretions noted. He denied pain. Wearing acute 2L O2 [place].  Met with patient to discuss diagnosis, prognosis, GOC, EOL wishes, disposition, and options.  I introduced Palliative Medicine as specialized medical care for people living  with serious illness. It focuses on providing relief from the symptoms and stress of a serious illness. The goal is to improve quality of life for both the patient and the family.  We discussed a brief life review of the patient as well as functional and nutritional status. Chris Morales is originally from , [place]; he moved to [place] 12 years ago and is still a current resident. Chris Morales is not currently married and does not have any children - his next of kin are his brother and sister. 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 He is of presbyterian faith. The patient states that he was first diagnosed with prostate cancer in June 2020 - he was not able to pursue treatment at that time due to not having insurance; he now does have insurance and is wanting to pursue treatments. Even though he was first diagnosed in June 2020, he did not start experiencing symptoms until March 2021. He states that he experienced weight loss and generalized weakness - discussion revealed that he was having issues accessing food due to his car being broken down - he agreed that being food insecure at that time had an effect on his weight loss and weakness. In October 2021 he started feeling short of breath and was/have been experiencing tachycardia. He was seeing a cardiologist outpatient   in [place] before he was hospitalized from 10/17-10/25/21. The patient states that at times prior to that hospitalization he was not eating well due to not having access to food - discussed with patient getting Social Worker's consulted for community resources to help - he was appreciative.   I tried to obtain more information around his food insecurity but the conversation was unclear. The patient stated that he has been working full time and the last day he worked was Friday; however, according to records, he was admitted in a [place] hospital at that time. The patient stated that he could not access food due to his car being  broken down, but expressed he used the metro and Uber to get to work, but not for food.   We discussed patient's current illness and what it means in the larger context of patient's on-going co-morbidities. The patient seems to have a clear understanding of his current medical situation. Natural disease trajectory and expectations at EOL were discussed. I attempted to elicit values and goals of care important to the patient. The difference between aggressive medical intervention and comfort care was considered in light of the patient's goals of care. His concerning poor prognosis was discussed. At this time, the patient wants to continue receiving full scope medical care. He wants to go to [facility] for further work up for his cancer and he states he is wanting to pursue recommended treatment options.   Palliative Care services outpatient were explained and offered - patient was agreeable.   Advance directives, concepts specific to code status, artificial feeding and hydration, and rehospitalization were considered and discussed. The patient does not have a Living Will or HCPOA - offered chaplain services to assist with these documents as well as for emotional and spiritual support while in house and he was agreeable. MOST form was introduced, reviewed, and completed. Encouraged patient/family to consider DNR/DNI status understanding evidenced based poor outcomes in similar hospitalized patient, as the cause of arrest is likely associated with advanced chronic illness rather than an easily reversible acute cardio-pulmonary event. Patient was not agreeable to DNR/DNI with understanding that he would receive CPR, defibrillation, ACLS medications, and  Intubation.  Patient states that on discharge, he is unsure of where he will go. He has a house in [place] if needed but he stated depending on PT, he might go live with his brother.  Visit also consisted of discussions dealing with the complex and emotionally  intense issues of symptom management and palliative care in the setting of serious and potentially life-threatening illness. Palliative care team will continue to support patient, patient's family, and medical team.  Discussed with patient the importance of continued conversation with family and the medical providers regarding overall plan of care and treatment options, ensuring decisions are within the context of the patient's values and GOCs.    Questions and concerns were addressed. The patient/family was encouraged to call with questions and/or concerns. PMT card was provided.   Primary Decision Maker: PATIENT    SUMMARY OF RECOMMENDATIONS:  Continue full scope medical treatment - patient is open to all recommended tests/procedures at this time  Continue full code status  Patient is hopeful for continued cancer work up at [facility] - he is open to further tests/procedures as recommended  TOC notified and consult placed for: outpatient Palliative Care to follow at discharge and for information/resources for food insecurity  Chaplain consulted for emotional support, prayer, and advance directive documented completion (Living Will and HCPOA)    MOST form completed as follows: Attempt Resuscitation, Full Scope of Treatment, Antibiotics if indicated, IV fluids if indicated, Feeding tube long-term if indicated - patient wanted to review overnight before signing, will follow up tomorrow and finalize  PMT will continue to follow peripherally. If there are any imminent needs please call the service directly  Code Status/Advance Care Planning:  Full code  Palliative Prophylaxis:   Aspiration, Bowel Regimen, Delirium Protocol, Frequent Pain Assessment, Oral Care and Turn Reposition  Additional Recommendations (Limitations, Scope, Preferences):  Full Scope Treatment  Psycho-social/Spiritual:   Desire for further Chaplaincy support:yes  Additional Recommendations: Referral to [employer] space and opportunity for patient to express thoughts and feelings regarding patient's current medical situation.   Emotional support provided.   Prognosis:   Unable to determine  Discharge Planning: To Be Determined      Primary Diagnoses: Present on Admission: . Atrial fibrillation with rapid ventricular response ([place]) . Euthyroid sick syndrome . Malignant neoplasm of prostate metastatic to bone ([place]) . Pleural effusion, bilateral . Normocytic anemia . Elevated d-dimer . Nutmeg liver   I have reviewed the medical record, interviewed the patient and family, and examined the patient. The following aspects are pertinent.  Past Medical History:  Diagnosis Date  . Prostate CA [facility])    Social History   Socioeconomic History  . Marital status: Divorced    Spouse name: Not on file  . Number of children: Not on file  . Years of education: Not on file  . Highest education level: Not on file  Occupational History  . Not on file  Tobacco Use  . Smoking status: Never Smoker  . Smokeless tobacco: Never Used  Vaping Use  . Vaping Use: Never used  Substance and Sexual Activity  . Alcohol use: Yes  . Drug use: No  . Sexual activity: Not Currently  Other Topics Concern  . Not on file  Social History Narrative  . Not on file   Social Determinants of Health   Financial Resource Strain:   . Difficulty of Paying Living Expenses: Not on file  Food Insecurity:   . Worried About Charity fundraiser in the Last Year: Not on file  . Ran Out of Food in the Last Year: Not on file  Transportation Needs:   . Lack of Transportation (Medical): Not on file  . Lack of Transportation (Non-Medical): Not on file  Physical Activity:   . Days of Exercise per Week: Not on file  . Minutes of Exercise per Session: Not on file  Stress:   . Feeling of Stress : Not on file  Social Connections:   . Frequency of Communication with Friends and Family: Not on file  .  Frequency of Social Gatherings with Friends and Family: Not on file  . Attends Religious Services: Not on file  . Active Member of Clubs or Organizations: Not on file  . Attends Archivist Meetings: Not on file  . Marital Status: Not on file   Family History  Problem Relation Age of Onset  . Prostate cancer Brother    Scheduled Meds: . Chlorhexidine Gluconate Cloth  6 each Topical Daily  . digoxin  0.25 mg Oral Daily  . levothyroxine  25 mcg Oral QAC breakfast  . metoprolol succinate  100 mg Oral BID   Continuous Infusions: PRN Meds:.acetaminophen, ondansetron (ZOFRAN) IV, polyethylene glycol Medications Prior to Admission:  Prior to Admission medications   Medication Sig Start Date End  Date Taking? Authorizing Provider  apixaban (ELIQUIS) 5 MG TABS tablet Take 5 mg by mouth 2 (two) times daily.   Yes [provider]  bicalutamide (CASODEX) 50 MG tablet Take 50 mg by mouth daily.   Yes [provider]  digoxin (LANOXIN) 0.25 MG tablet Take 0.25 mg by mouth daily.   Yes [provider]  docusate sodium (COLACE) 100 MG capsule Take 100 mg by mouth 2 (two) times daily as needed (constipation).   Yes [provider]  levothyroxine (SYNTHROID) 25 MCG tablet Take 25 mcg by mouth daily before breakfast.   Yes [provider]  metoprolol succinate (TOPROL-XL) 50 MG 24 hr tablet Take 100 mg by mouth in the morning and at bedtime. Take with or immediately following a meal.   Yes [provider]  pantoprazole (PROTONIX) 40 MG tablet Take 40 mg by mouth daily.   Yes [provider]  senna (SENOKOT) 8.6 MG TABS tablet Take 1 tablet by mouth 2 (two) times daily as needed (constipation).   Yes [provider]  folic acid (FOLVITE) 1 MG tablet Take 1 mg by mouth daily.    [provider]  HYDROcodone-acetaminophen (NORCO/VICODIN) 5-325 MG tablet Take 1-2 tablets by mouth every 4 (four) hours as needed for  moderate pain. Patient not taking: Reported on 06/24/2020 02/23/19   Earnie Larsson, MD  tamsulosin (FLOMAX) 0.4 MG CAPS capsule Take 1 capsule (0.4 mg total) by mouth daily. Patient not taking: Reported on 06/24/2020 02/24/19   Earnie Larsson, MD   No Known Allergies Review of Systems  Constitutional: Positive for appetite change, fatigue and unexpected weight change.  Respiratory: Positive for shortness of breath.   Gastrointestinal: Negative for nausea.  Neurological: Positive for weakness.  All other systems reviewed and are negative.   Physical Exam Constitutional:      General: He is not in acute distress. Pulmonary:     Effort: No respiratory distress.  Skin:    General: Skin is warm and dry.  Neurological:     Mental Status: He is alert and oriented to person, place, and time.     Motor: Weakness present.  Psychiatric:        Speech: Speech normal.        Behavior: Behavior is cooperative.        Cognition and Memory: Cognition and memory normal.     Vital Signs: BP 138/82 (BP Location: Left Arm)   Pulse (!) 116   Temp 98.6 F (37 C) (Oral)   Resp (!) 21   Ht 6' 1" (1.854 m)   Wt 70.5 kg   SpO2 100%   BMI 20.51 kg/m  Pain Scale: 0-10   Pain Score: 0-No pain   SpO2: SpO2: 100 % O2 Device:SpO2: 100 % O2 Flow Rate: .O2 Flow Rate (L/min): 2 L/min  IO: Intake/output summary:   Intake/Output Summary (Last 24 hours) at 06/27/2020 0914 Last data filed at 06/27/2020 0449 Gross per 24 hour  Intake --  Output 650 ml  Net -650 ml    LBM: Last BM Date: 06/26/20 Baseline Weight: Weight: 68 kg Most recent weight: Weight: 70.5 kg     Palliative Assessment/Data: PPS 70%     Time In: 0915 Time Out: 1027 Time Total: 72 minutes Greater than 50%  of this time was spent counseling and coordinating care related to the above assessment and plan.  Signed by: Lin Landsman, NP   Please contact Palliative Medicine Team phone at [phone]  for questions and concerns.   For individual provider: See Shea Evans

## 2020-06-27 NOTE — Evaluation (Signed)
Occupational Therapy Evaluation Patient Details Name: Chris Morales MRN: [id number] DOB: 1956/02/20 Today's Date: 06/27/2020    History of Present Illness Pt adm with afib with rvr, acute hypoxic respiratory failure likely secondary to malignant bil pleural effusions, and lt UE weakness. Pt had recently been hospitalized in [place] with afib with rvr and resp failure requiring vent support and then left AMA before flying back to [facility] and presenting immediately to ED. 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 PMH - prostate CA with bony mets, C3-4 laminectomy with tumor removal 01/2019, afib   Clinical Impression   Pt admitted with above diagnoses, presenting with generalized weakness and decreased activity tolerance to complete ADL at desired level of independence. Pt reports he was living between [place] and [place], but unsure of exact story due to pt giving conflicting reports. RN reports pt has siblings here to help provide care. Pt currently requires min A for bed mobility and sit <> stands with mod A and RW. He was able to take a few forward steps, fatigues easily. Pt presents with some dizziness with position change, but BP is stable. Noted LUE diffuse weakness. Also noted RUE weakness and dysfunction. Pt reports that he "fell asleep on it in the car and it went numb". Suspect this to be brachial plexus involvement vs impingement. Given current status, recommend CIR at d/c for continued ADL progression. Will continue to follow per POC listed below.     Follow Up Recommendations  CIR    Equipment Recommendations  3 in 1 bedside commode;Wheelchair (measurements OT);Wheelchair cushion (measurements OT)    Recommendations for Other Services Rehab consult     Precautions / Restrictions Precautions Precautions: Fall Restrictions Weight Bearing Restrictions: No      Mobility Bed Mobility Overal bed mobility: Needs Assistance Bed Mobility: Supine to Sit;Sit to Supine      Supine to sit: Min assist Sit to supine: Min assist   General bed mobility comments: assist to pull up ttrunk to sitting; assist to guide BLEs back onto bed    Transfers Overall transfer level: Needs assistance Equipment used: Rolling walker (2 wheeled) Transfers: Sit to/from Stand Sit to Stand: Mod assist         General transfer comment: assist to power up into standing, cues for hand placement    Balance Overall balance assessment: Needs assistance Sitting-balance support: Feet supported Sitting balance-Leahy Scale: Fair     Standing balance support: Bilateral upper extremity supported;During functional activity Standing balance-Leahy Scale: Poor Standing balance comment: reliant on external support and OT assist                           ADL either performed or assessed with clinical judgement   ADL Overall ADL's : Needs assistance/impaired Eating/Feeding: Set up;Sitting   Grooming: Set up;Sitting   Upper Body Bathing: Minimal assistance;Sitting   Lower Body Bathing: Moderate assistance;Sit to/from stand;Sitting/lateral leans   Upper Body Dressing : Minimal assistance;Sitting   Lower Body Dressing: Maximal assistance;Sitting/lateral leans;Sit to/from stand Lower Body Dressing Details (indicate cue type and reason): ot don socks seated EOB Toilet Transfer: Moderate assistance;Stand-pivot;RW Toilet Transfer Details (indicate cue type and reason): assist to power up into stand Toileting- Clothing Manipulation and Hygiene: Maximal assistance;Sitting/lateral lean;Sit to/from stand       Functional mobility during ADLs: Moderate assistance;Rolling walker;Cueing for sequencing;Cueing for safety       Vision Patient Visual Report: No change from baseline  Perception     Praxis      Pertinent Vitals/Pain Pain Assessment: No/denies pain     Hand Dominance Right   Extremity/Trunk Assessment Upper Extremity Assessment Upper Extremity  Assessment: Generalized weakness;RUE deficits/detail;LUE deficits/detail RUE Deficits / Details: pt reports his "rotator cuff is shot" 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 Pt states he fell asleep on the arm in a car and it went numb. Noted wrsit drop and inability to flex shoulder actively LUE Deficits / Details: diffuse weakness LUE Coordination: decreased fine motor;decreased gross motor   Lower Extremity Assessment Lower Extremity Assessment: Defer to PT evaluation       Communication Communication Communication: No difficulties   Cognition Arousal/Alertness: Awake/alert Behavior During Therapy: Flat affect Overall Cognitive Status: Impaired/Different from baseline Area of Impairment: Orientation;Following commands;Safety/judgement;Awareness;Problem solving                 Orientation Level: Situation (telling OT he has no family around when he has two family members coming to visit today)     Following Commands: Follows one step commands with increased time Safety/Judgement: Decreased awareness of safety;Decreased awareness of deficits Awareness: Emergent Problem Solving: Slow processing;Requires verbal cues;Requires tactile cues General Comments: pt presenting with flat affect, disoriented to current situation. Requires increased time and cues to process basic tasks   General Comments       Exercises     Shoulder Instructions      Home Living Family/patient expects to be discharged to:: Private residence Living Arrangements: Alone Available Help at Discharge: Family;Available PRN/intermittently Type of Home: House       Home Layout: One level     Bathroom Shower/Tub: Occupational psychologist: Standard     Home Equipment: None          Prior Functioning/Environment Level of Independence: Independent                 OT Problem List: Decreased strength;Decreased knowledge of use of DME or AE;Decreased activity tolerance;Decreased  cognition;Impaired UE functional use;Impaired balance (sitting and/or standing);Decreased safety awareness;Decreased coordination      OT Treatment/Interventions: Self-care/ADL training;Therapeutic exercise;Patient/family education;Balance training;Energy conservation;Therapeutic activities;DME and/or AE instruction    OT Goals(Current goals can be found in the care plan section) Acute Rehab OT Goals Patient Stated Goal: return to independence OT Goal Formulation: With patient Time For Goal Achievement: 07/11/20 Potential to Achieve Goals: Good  OT Frequency: Min 2X/week   Barriers to D/C:            Co-evaluation              AM-PAC OT "6 Clicks" Daily Activity     Outcome Measure Help from another person eating meals?: A Little Help from another person taking care of personal grooming?: A Little Help from another person toileting, which includes using toliet, bedpan, or urinal?: A Lot Help from another person bathing (including washing, rinsing, drying)?: A Lot Help from another person to put on and taking off regular upper body clothing?: A Little Help from another person to put on and taking off regular lower body clothing?: A Lot 6 Click Score: 15   End of Session Equipment Utilized During Treatment: Rolling walker;Gait belt Nurse Communication: Mobility status  Activity Tolerance: Patient tolerated treatment well Patient left: in chair;with call bell/phone within reach  OT Visit Diagnosis: Unsteadiness on feet (R26.81);Other abnormalities of gait and mobility (R26.89);Muscle weakness (generalized) (M62.81);Other symptoms and signs involving cognitive function  Time: 1013-1040 OT Time Calculation (min): 27 min Charges:  OT General Charges $OT Visit: 1 Visit OT Evaluation $OT Eval Moderate Complexity: 1 Mod OT Treatments $Self Care/Home Management : 8-22 mins  Zenovia Jarred, MSOT, OTR/L Acute Rehabilitation Services [facility] Office Number:  [phone] Pager: [phone]  Zenovia Jarred 06/27/2020, 2:23 PM

## 2020-06-28 ENCOUNTER — Inpatient Hospital Stay (HOSPITAL_COMMUNITY): Payer: PRIVATE HEALTH INSURANCE

## 2020-06-28 DIAGNOSIS — I4891 Unspecified atrial fibrillation: Secondary | ICD-10-CM | POA: Diagnosis not present

## 2020-06-28 DIAGNOSIS — R7989 Other specified abnormal findings of blood chemistry: Secondary | ICD-10-CM | POA: Diagnosis not present

## 2020-06-28 DIAGNOSIS — Z9889 Other specified postprocedural states: Secondary | ICD-10-CM

## 2020-06-28 DIAGNOSIS — R0602 Shortness of breath: Secondary | ICD-10-CM

## 2020-06-28 DIAGNOSIS — Z789 Other specified health status: Secondary | ICD-10-CM | POA: Diagnosis not present

## 2020-06-28 DIAGNOSIS — Z7189 Other specified counseling: Secondary | ICD-10-CM | POA: Diagnosis not present

## 2020-06-28 DIAGNOSIS — J90 Pleural effusion, not elsewhere classified: Secondary | ICD-10-CM

## 2020-06-28 HISTORY — PX: IR THORACENTESIS ASP PLEURAL SPACE W/IMG GUIDE: IMG5380

## 2020-06-28 LAB — BASIC METABOLIC PANEL
Anion gap: 8 (ref 5–15)
BUN: 14 mg/dL (ref 8–23)
CO2: 23 mmol/L (ref 22–32)
Calcium: 7.3 mg/dL — ABNORMAL LOW (ref 8.9–10.3)
Chloride: 108 mmol/L (ref 98–111)
Creatinine, Ser: 0.68 mg/dL (ref 0.61–1.24)
GFR, Estimated: >60 mL/min (ref >60)
Glucose, Bld: 108 mg/dL — ABNORMAL HIGH (ref 70–99)
Potassium: 4 mmol/L (ref 3.5–5.1)
Sodium: 139 mmol/L (ref 135–145)

## 2020-06-28 LAB — BODY FLUID CELL COUNT WITH DIFFERENTIAL
Lymphs, Fluid: 24 %
Monocyte-Macrophage-Serous Fluid: 8 % — ABNORMAL LOW (ref 50–90)
Neutrophil Count, Fluid: 68 % — ABNORMAL HIGH (ref 0–25)
Total Nucleated Cell Count, Fluid: 312 cu mm (ref 0–1000)

## 2020-06-28 LAB — CBC
HCT: 25 % — ABNORMAL LOW (ref 39.0–52.0)
Hemoglobin: 7.4 g/dL — ABNORMAL LOW (ref 13.0–17.0)
MCH: 27.2 pg (ref 26.0–34.0)
MCHC: 29.6 g/dL — ABNORMAL LOW (ref 30.0–36.0)
MCV: 91.9 fL (ref 80.0–100.0)
Platelets: 245 10*3/uL (ref 150–400)
RBC: 2.72 MIL/uL — ABNORMAL LOW (ref 4.22–5.81)
RDW: 22.5 % — ABNORMAL HIGH (ref 11.5–15.5)
WBC: 7.4 10*3/uL (ref 4.0–10.5)
nRBC: 3 % — ABNORMAL HIGH (ref 0.0–0.2)

## 2020-06-28 LAB — CYTOLOGY - NON PAP

## 2020-06-28 LAB — GRAM STAIN

## 2020-06-28 LAB — LACTATE DEHYDROGENASE, PLEURAL OR PERITONEAL FLUID: LD, Fluid: 128 U/L — ABNORMAL HIGH (ref 3–23)

## 2020-06-28 LAB — PROTEIN, PLEURAL OR PERITONEAL FLUID: Total protein, fluid: <3 g/dL

## 2020-06-28 LAB — MAGNESIUM: Magnesium: 1.9 mg/dL (ref 1.7–2.4)

## 2020-06-28 MED ORDER — SENNOSIDES-DOCUSATE SODIUM 8.6-50 MG PO TABS
2.0000 | ORAL_TABLET | Freq: Two times a day (BID) | ORAL | Status: DC
  Administered 2020-06-28 – 2020-06-29 (×3): 2 via ORAL
  Filled 2020-06-28 (×4): qty 2

## 2020-06-28 MED ORDER — LIDOCAINE HCL 1 % IJ SOLN
INTRAMUSCULAR | Status: DC | PRN
  Administered 2020-06-28: 10 mL

## 2020-06-28 MED ORDER — LIDOCAINE HCL 1 % IJ SOLN
INTRAMUSCULAR | Status: AC
  Filled 2020-06-28: qty 20

## 2020-06-28 NOTE — Progress Notes (Addendum)
Inpatient Rehabilitation Admissions Coordinator  Inpatient rehab consult received, I met with patient at bedside for assessment We discussed goals and expectations of a possible Cir admit. I have asked him to discuss with his local family what support they can provide after any rehab venue. His insurance is out of network and CIR admit here would have to be negotiated. I will follow up with him on Monday with clarification of his overall goals, follow up and caregiver support before proceeding further.  Danne Baxter, RN, MSN Rehab Admissions Coordinator [phone] 06/28/2020 1:14 PM   I spoke with pt's brother, Darnell Level, by phone to discuss inpt rehab as a possible venue for rehab. His plans are for patient to d/c to Bruce's home after receiving rehab. I would like to also see oncology consult recommendations before proceeding with his potential for intensive rehab. I will follow up on Monday. I have asked Bruce to provide nursing staff pt's living will and POA documentation for the chart.  Danne Baxter, RN, MSN Rehab Admissions Coordinator [phone] 06/28/2020 2:08 PM

## 2020-06-28 NOTE — TOC Initial Note (Signed)
Transition of Care [facility]) - Initial/Assessment Note    Patient Details  Name: Chris Morales MRN: [id number] Date of Birth: 1955/09/21  Transition [facility] CM/SW Contact:    Bethena Roys, RN Phone Number: 06/28/2020, 2:49 PM  Clinical Narrative: Patient presented for tachycardia. Patient transitioned from [place] to [place]. Patient has support from his brother Dr. Danella Deis here in [place]. Case Manager spoke with patient and received verbal confirmation to speak with his brother. Patient states he will be returning to [place] after rehab. Case Manager spoke with Dr. Danella Deis this am regarding transition plan. Dr. Chrissie Noa states that the patient will stay in [facility] after rehab. Brothers address is [address] [place], [place] [address]. Case Manager expressed to the brother that the recommendations are for CIR; however, CIR has to assess the patient to see if he is a candidate for rehab. Patient has out of network insurance coverage with [facility]. Patient is also listed as having Medicaid; unsure if active. Case Manager from [place] is Altha Harm @ [phone]- she states that the patient may be able to get an out of area network agreement to possibly approve CIR-unsure at this time. Altha Harm provided Case Manager with a number to [employer] for the patient to call his Medicaid provider @ [phone]. Case Manager provided the numbers to the patient to call or his brother to call. Palliative Care is following- unsure of where the patient will go after discharge- palliative has suggested outpatient palliative. Case Manager will continue to follow as it gets closer to transition to see what plans the patient has; vs.staying in [place] or returning to [place]. Disclosure signed by patient for the insurance company to speak with the brother.    Expected Discharge Plan: IP Rehab Facility Barriers to Discharge: Continued Medical Work up   Patient Goals and CMS  Choice Patient states their goals for this hospitalization and ongoing recovery are:: unsure of plan at this time   Choice offered to / list presented to : NA  Expected Discharge Plan and Services Expected Discharge Plan: [place] In-house Referral: Hospice / Palliative Care Discharge Planning Services: CM Consult Post Acute Care Choice: IP Rehab                     DME Agency: NA       HH Arranged: NA          Prior Living Arrangements/Services   Lives with:: Self (Patient relocated from [place]) Patient language and need for interpreter reviewed:: Yes        Need for Family Participation in Patient Care: Yes (Comment) Care giver support system in place?: Yes (comment)   Criminal Activity/Legal Involvement Pertinent to Current Situation/Hospitalization: No - Comment as needed  Activities of Daily Living      Permission Sought/Granted Permission sought to share information with : Family Supports Permission granted to share information with : Yes, Verbal Permission Granted  Share Information with NAME: [place] Dr. 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 Pleural effusion   . SOB (shortness of breath)   . Status post thoracentesis   . Palliative care by specialist   . Goals of care, counseling/discussion   . Encounter for hospice care discussion   . Full code status   . Atrial fibrillation with rapid ventricular  response ([place]) 06/25/2020  . 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 Left-sided weakness 06/25/2020  . Foley catheter in place on admission 06/25/2020  . Pleural effusion, bilateral 06/25/2020  . Normocytic anemia 06/25/2020  . Elevated d-dimer 06/25/2020  . 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 Atrial fibrillation, chronic ([place]) 06/25/2020  . 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 AKI (acute kidney injury) ([place]) 02/20/2019  . Prostate CA ([place]) 02/20/2019  . Cervical myelopathy ([place]) 02/19/2019   PCP:  Patient, No Pcp Per Pharmacy:   [id number] - [place], [place] [address] [place] [address] Phone: [phone] Fax: [phone]   Readmission Risk Interventions No flowsheet data found.

## 2020-06-28 NOTE — Progress Notes (Signed)
PROGRESS NOTE    Chris Morales  HYH:[id number] DOB: 09/14/1955 DOA: 06/24/2020 PCP: Patient, No Pcp Per    Brief Narrative:  Chris Morales is a 64 year old male with past medical history notable for prostate cancer with extensive bony metastases, permanent atrial fibrillation, hypothyroidism, who presented to [facility] ED with shortness of breath, palpitations, lower extremity edema and left-sided weakness.  Over the past 9 days, he reports progressive shortness of breath that is severe; worse with exertion improved with rest.  Patient recently hospitalized [facility] health in both [place] from 1017-06/24/2020.  During the hospitalization, patient was found to be in atrial fibrillation with RVR and respiratory failure requiring admission to the intensive care unit and ventilatory support.  Patient was tried on multiple AV nodal blocking agents including amiodarone, digoxin and finally was placed on scheduled metoprolol therapy.  Attempts were made at chemical cardioversion and failed.  Patient was eventually extubated and Chris Morales lies he was suffering from left-sided upper extremity weakness and decided to leave AMA on 06/24/2020 and he took a flight from [place] to [place] where his brother lives.  Immediately after landing, patient proceeded to the [name] ED for evaluation.  In the ED, patient noted to be in A. fib with RVR with chest x-ray revealing substantial bilateral pulmonary effusions with noted metastatic lesions to the bones.  Patient was found to have an elevated D-dimer 19.33.  Hospitalist service consulted for admission for further evaluation and treatment of severe shortness of breath, respiratory failure requiring oxygen therapy and A. fib with RVR.   Assessment & Plan:   Principal Problem:   Atrial fibrillation with rapid ventricular response (HCC) Active Problems:   Malignant neoplasm of prostate metastatic to bone [facility])   Euthyroid sick syndrome    Left-sided weakness   Foley catheter in place on admission   Pleural effusion, bilateral   Normocytic anemia   Elevated d-dimer   Nutmeg liver   Palliative care by specialist   Goals of care, counseling/discussion   Encounter for hospice care discussion   Full code status   Atrial fibrillation with RVR Patient presenting with persistent A. fib with RVR.  Recent extensive hospitalization in [place] with difficult to control A. fib despite multiple AV nodal blocking agents including amiodarone, digoxin, failed chemical cardioversion.  On presentation, patient was once again in A. fib with RVR and started on a Cardizem drip; now which is titrated off. --Continue metoprolol succinate 100 mg p.o. BID, hold for SBP<90 or HR <60 --Digoxin 0.25 mg p.o. daily --Continue to hold home Eliquis --Continue to monitor on telemetry  Acute hypoxic respiratory failure 2/2 to decompensated diastolic congestive heart failure with likely exudative bilateral pleural effusions likely malignant Patient presenting with progressive shortness of breath, slightly hypoxic on admission requiring 2 L nasal cannula.  Recent hospitalization requiring ventilatory support.  Chest x-ray notable for substantial bilateral pleural effusions, suspect likely malignant etiology.  CT angiogram chest negative for pulmonary embolism.  No signs of infectious etiology.  TTE 06/25/2020 with LVEF 50-55%, no LV wall motion abnormalities, LA mildly dilated, RA mildly dilated, large pleural effusion on left, moderate TR, mild MR, IVC dilated. Underwent IR ultrasound-guided right thoracentesis on 06/27/2020 with 1.6 L dark yellow fluid removed and ultrasound-guided left thoracentesis on 06/28/2020 with 1.3 L bright yellow fluid removed. --Holding Eliquis --Right Pleural fluid consistent with exudative effusion, pending cytology and culture --Pending left pleural fluid cytology, Gram stain, culture, LDH, total protein. --Continue supplemental  oxygen, maintain SPO2 greater than 92%; on 2 L nasal cannula  Left-sided weakness Patient reports new left upper extremity weakness.  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  Case was discussed with neurology by previous hospitalist, unlikely dural based met versus meningioma as likely cause, and recommended further imaging with MRI C-spine.  Patient unable to tolerate MR C-spine today once again. --PT/OT recommends CIR --Pending CIR evaluation  Prostate cancer with bony metastasis Diagnosed with prostate cancer June 2020, but has yet to receive treatment.  Imaging studies during hospitalization notable for extensive osseous metastatic disease.  Patient desires to establish care with [facility]; and after discussion with patient's brother, he requests oncology evaluation inpatient as patient would benefit from more local care.  --Palliative care following for poor prognosis, plan to continue to follow outpatient --Medical oncology consulted for evaluation and treatment options  Hypothyroidism versus sick euthyroid syndrome Patient with mild elevation of TSH, started on Synthroid in [place]. --TSH 7.345 --Continue levothyroxine 25 mcg p.o. 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  Iron 46, TIBC 141 (low), ferritin 590 (high); consistent with anemia of chronic disease, likely secondary to cancer/metastases. --Hemoglobin 9.0>7.4 --Continue to monitor hemoglobin daily --Transfuse for hemoglobin less than 7.0  Elevated D-dimer D-dimer elevated 19.33.  CT angiogram chest negative for pulmonary embolism.  Records from [place] facility notes CT abdomen/pelvis revealing nutmeg liver concerning for portal vein thrombosis.  Ultrasound Doppler liver unremarkable.  Etiology of his  elevated D-dimer likely secondary to his underlying malignancy.   DVT prophylaxis: SCDs, holding Eliquis for IR thoracentesis Code Status: Full code Family Communication: No family present at bedside; updated patient's brother, Dr. Chrissie Noa via telephone this morning  Disposition Plan:  Status is: Inpatient  Remains inpatient appropriate because:Ongoing active pain requiring inpatient pain management, Ongoing diagnostic testing needed not appropriate for outpatient work up, Unsafe d/c plan, IV treatments appropriate due to intensity of illness or inability to take PO and Inpatient level of care appropriate due to severity of illness   Dispo: The patient is from: Home              Anticipated d/c is to: CIR              Anticipated d/c date is: 2 days              Patient currently is not medically stable to d/c.    Consultants:   Palliative care  Interventional radiology  Medical oncology  Procedures:   None  Antimicrobials:   None   Subjective: Patient seen and examined bedside, resting comfortably in bedside chair.  Patient reports mild shortness of breath overnight, placed back on 2 L nasal cannula.  No other complaints at this time.  Discussed obtaining thoracentesis to the left side today to alleviate some of his symptoms.  Updated patient's brother via telephone this morning. Denies fever/chills/night sweats, no nausea/vomiting/diarrhea, no chest pain, no palpitations, no shortness of breath, no abdominal pain, no cough/congestion.  No acute events overnight per nursing staff.  Objective: Vitals:   06/27/20 2355 06/28/20 0438 06/28/20 0835 06/28/20 1130  BP: (!) 105/59 130/76 105/70 116/81  Pulse: 93 83 83 81  Resp: (!) 23 (!) _0 Temp: 98.1 F (36.7 C) 98.1 F (36.7 C) 98.1 F (36.7 C) 98 F (36.7  C)  TempSrc: Oral Oral Oral Oral  SpO2: 95% 98% 100% 98%  Weight:      Height:        Intake/Output Summary (Last 24 hours) at 06/28/2020 1315 Last data  filed at 06/28/2020 0600 Gross per 24 hour  Intake 720 ml  Output 625 ml  Net 95 ml   Filed Weights   06/24/20 1850 06/26/20 0420 06/27/20 0500  Weight: 68 kg 70.2 kg 70.5 kg    Examination:  General exam: Appears calm and comfortable  Respiratory system: Breath sounds slightly decreased bilateral bases, no wheezing, no rhonchi, normal respiratory effort, on 2 L nasal cannula with SPO2 96% Cardiovascular system: S1 & S2 heard, irregularly irregular rhythm, normal rate. No JVD, murmurs, rubs, gallops or clicks.  Trace to 1+ pitting edema bilaterally Gastrointestinal system: Abdomen is nondistended, soft and nontender. No organomegaly or masses felt. Normal bowel sounds heard. Central nervous system: Alert and oriented. No focal neurological deficits. Extremities: Symmetric 5 x 5 power. Skin: No rashes, lesions or ulcers Psychiatry: Judgement and insight appear normal. Mood & affect appropriate.     Data Reviewed: I have personally reviewed following labs and imaging studies  CBC: Recent Labs  Lab 06/24/20 1852 06/28/20 0202  WBC 8.4 7.4  HGB 9.0* 7.4*  HCT 31.4* 25.0*  MCV 94.0 91.9  PLT 232 751   Basic Metabolic Panel: Recent Labs  Lab 06/24/20 1852 06/25/20 1546 06/28/20 0202  NA 140  --  139  K 3.9  --  4.0  CL 107  --  108  CO2 25  --  23  GLUCOSE 153*  --  108*  BUN 9  --  14  CREATININE 0.61  --  0.68  CALCIUM 7.5*  --  7.3*  MG  --  2.0 1.9   GFR: Estimated Creatinine Clearance: 93 mL/min (by C-G formula based on SCr of 0.68 mg/dL). Liver Function Tests: Recent Labs  Lab 06/24/20 1852 06/27/20 1520  AST 49*  --   ALT 29  --   ALKPHOS 2,755*  --   BILITOT 0.7  --   PROT 5.6* 5.3*  ALBUMIN 1.9*  --    No results for input(s): LIPASE, AMYLASE in the last 168 hours. No results for input(s): AMMONIA in the last 168 hours. Coagulation Profile: No results for input(s): INR, PROTIME in the last 168 hours. Cardiac Enzymes: No results for input(s):  CKTOTAL, CKMB, CKMBINDEX, TROPONINI in the last 168 hours. BNP (last 3 results) No results for input(s): PROBNP in the last 8760 hours. HbA1C: Recent Labs    06/25/20 1546  HGBA1C 5.8*   CBG: No results for input(s): GLUCAP in the last 168 hours. Lipid Profile: Recent Labs    06/25/20 1546  CHOL 132  HDL 28*  LDLCALC 87  TRIG 87  CHOLHDL 4.7   Thyroid Function Tests: Recent Labs    06/25/20 1546  TSH 7.345*   Anemia Panel: Recent Labs    06/25/20 1546  VITAMINB12 810  FOLATE 9.6  TIBC 141*  IRON 46   Sepsis Labs: Recent Labs  Lab 06/25/20 1933 06/26/20 0735 06/26/20 1038 06/27/20 0202  PROCALCITON 0.43  --   --   --   LATICACIDVEN  --  1.7 2.6* 1.2    Recent Results (from the past 240 hour(s))  Urine culture     Status: None   Collection Time: 06/24/20  9:57 PM   Specimen: Urine, Catheterized  Result Value Ref Range Status  Specimen Description URINE, CATHETERIZED  Final   Special Requests NONE  Final   Culture   Final    NO GROWTH Performed at [facility], [address], [place], [place] [address]    Report Status 06/26/2020 FINAL  Final  Respiratory Panel by RT PCR (Flu A&B, Covid) - Nasopharyngeal Swab     Status: None   Collection Time: 06/25/20  4:00 PM   Specimen: Nasopharyngeal Swab  Result Value Ref Range Status   SARS Coronavirus 2 by RT PCR NEGATIVE NEGATIVE Final    Comment: (NOTE) SARS-CoV-2 target nucleic acids are NOT DETECTED.  The SARS-CoV-2 RNA is generally detectable in upper respiratoy specimens during the acute phase of infection. The lowest concentration of SARS-CoV-2 viral copies this assay can detect is 131 copies/mL. A negative result does not preclude SARS-Cov-2 infection and should not be used as the sole basis for treatment or other patient management decisions. A negative result may occur with  improper specimen collection/handling, submission of specimen other than nasopharyngeal swab, presence of viral  mutation(s) within the areas targeted by this assay, and inadequate number of viral copies (<131 copies/mL). A negative result must be combined with clinical observations, patient history, and epidemiological information. The expected result is Negative.  Fact Sheet for Patients:  https://www.moore.com/  Fact Sheet for Healthcare Providers:  https://www.young.biz/  This test is no t yet approved or cleared by the [place] FDA and  has been authorized for detection and/or diagnosis of SARS-CoV-2 by FDA under an Emergency Use Authorization (EUA). This EUA will remain  in effect (meaning this test can be used) for the duration of the COVID-19 declaration under Section 564(b)(1) of the Act, 21 U.S.C. section 360bbb-3(b)(1), unless the authorization is terminated or revoked sooner.     Influenza A by PCR NEGATIVE NEGATIVE Final   Influenza B by PCR NEGATIVE NEGATIVE Final    Comment: (NOTE) The Xpert Xpress SARS-CoV-2/FLU/RSV assay is intended as an aid in  the diagnosis of influenza from Nasopharyngeal swab specimens and  should not be used as a sole basis for treatment. Nasal washings and  aspirates are unacceptable for Xpert Xpress SARS-CoV-2/FLU/RSV  testing.  Fact Sheet for Patients: https://www.moore.com/  Fact Sheet for Healthcare Providers: https://www.young.biz/  This test is not yet approved or cleared by the [place] FDA and  has been authorized for detection and/or diagnosis of SARS-CoV-2 by  FDA under an Emergency Use Authorization (EUA). This EUA will remain  in effect (meaning this test can be used) for the duration of the  Covid-19 declaration under Section 564(b)(1) of the Act, 21  U.S.C. section 360bbb-3(b)(1), unless the authorization is  terminated or revoked. Performed at [facility], [address], [place], [place] [address]   SARS CORONAVIRUS 2 (TAT 6-24 HRS)  Nasopharyngeal Nasopharyngeal Swab     Status: None   Collection Time: 06/26/20 11:40 AM   Specimen: Nasopharyngeal Swab  Result Value Ref Range Status   SARS Coronavirus 2 NEGATIVE NEGATIVE Final    Comment: (NOTE) SARS-CoV-2 target nucleic acids are NOT DETECTED.  The SARS-CoV-2 RNA is generally detectable in upper and lower respiratory specimens during the acute phase of infection. Negative results do not preclude SARS-CoV-2 infection, do not rule out co-infections with other pathogens, and should not be used as the sole basis for treatment or other patient management decisions. Negative results must be combined with clinical observations, patient history, and epidemiological information. The expected result is Negative.  Fact  Sheet for Patients: SugarRoll.be  Fact Sheet for Healthcare Providers: https://www.woods-mathews.com/  This test is not yet approved or cleared by the [place] FDA and  has been authorized for detection and/or diagnosis of SARS-CoV-2 by FDA under an Emergency Use Authorization (EUA). This EUA will remain  in effect (meaning this test can be used) for the duration of the COVID-19 declaration under Se ction 564(b)(1) of the Act, 21 U.S.C. section 360bbb-3(b)(1), unless the authorization is terminated or revoked sooner.  Performed at [facility], [place] [address] [place], [place] [address]   Gram stain     Status: None   Collection Time: 06/27/20 11:40 AM   Specimen: PATH Cytology Pleural fluid  Result Value Ref Range Status   Specimen Description PLEURAL FLUID  Final   Special Requests NONE  Final   Gram Stain   Final    WBC PRESENT,BOTH PMN AND MONONUCLEAR NO ORGANISMS SEEN CYTOSPIN SMEAR Performed at [facility], [place] [address], [place], [place] [address]    Report Status 06/27/2020 FINAL  Final         Radiology Studies: DG Chest 1 View  Result Date: 06/28/2020 CLINICAL DATA:   Left thoracentesis. EXAM: CHEST  1 VIEW COMPARISON:  06/28/2020. FINDINGS: Mediastinum and hilar structures normal. Stable cardiomegaly. Bilateral interstitial prominence again noted with interim improvement from prior exam. Findings suggests improving CHF. Improved left pleural effusion. Tiny right pleural effusion. No pneumothorax post thoracentesis. IMPRESSION: Findings consistent with improving CHF. Improved left pleural effusion. No pneumothorax post thoracentesis. Tiny right pleural effusion. Electronically Signed   By: Marcello Moores  Register   On: 06/28/2020 10:55   DG Chest 1 View  Result Date: 06/27/2020 CLINICAL DATA:  Post right thoracentesis. EXAM: CHEST  1 VIEW COMPARISON:  06/24/2020 FINDINGS: Interval improvement in right pleural effusion. No pneumothorax. Improved aeration right lung base Interval progression of left pleural effusion and left lower lobe atelectasis Extensive sclerotic bony changes compatible with metastatic prostate cancer. IMPRESSION: Improved right pleural effusion with no pneumothorax post thoracentesis Progression of left effusion and left lower lobe atelectasis. Bony metastatic disease. Electronically Signed   By: Franchot Gallo M.D.   On: 06/27/2020 12:06   DG CHEST PORT 1 VIEW  Result Date: 06/28/2020 CLINICAL DATA:  Shortness of breath. EXAM: PORTABLE CHEST 1 VIEW COMPARISON:  06/27/2020. FINDINGS: Mediastinum and heart size normal. Progression of diffuse left lung interstitial infiltrates/edema. Diffuse right lung interstitial infiltrates/edema noted on today's exam. Persistent left-sided pleural effusion without interim change. No pneumothorax. Extensive sclerotic bony metastatic disease again noted. IMPRESSION: 1. Progression of diffuse left lung interstitial infiltrates/edema. Diffuse right lung interstitial infiltrates/edema noted on today's exam. Persistent left-sided pleural effusion without interim change. 2. Extensive sclerotic bony metastatic disease again  noted. Electronically Signed   By: Marcello Moores  Register   On: 06/28/2020 05:52   IR THORACENTESIS ASP PLEURAL SPACE W/IMG GUIDE  Result Date: 06/28/2020 INDICATION: Patient with a history of prostate cancer with extensive metastases now has new onset pleural effusions. Interventional radiology asked to perform a therapeutic and diagnostic thoracentesis. EXAM: ULTRASOUND GUIDED THORACENTESIS MEDICATIONS: 1% lidocaine 10 mL COMPLICATIONS: None immediate. PROCEDURE: An ultrasound guided thoracentesis was thoroughly discussed with the patient and questions answered. The benefits, risks, alternatives and complications were also discussed. The patient understands and wishes to proceed with the procedure. Written consent was obtained. Ultrasound was performed to localize and mark an adequate pocket of fluid in the left chest. The area was then prepped and draped in the  normal sterile fashion. 1% Lidocaine was used for local anesthesia. Under ultrasound guidance a 6 Fr Safe-T-Centesis catheter was introduced. Thoracentesis was performed. The catheter was removed and a dressing applied. FINDINGS: A total of approximately 1.3 L of bright yellow fluid was removed. Samples were sent to the laboratory as requested by the clinical team. IMPRESSION: Successful ultrasound guided left thoracentesis yielding 1.3 L of pleural fluid. Read by: Soyla Dryer, NP Electronically Signed   By: Corrie Mckusick D.O.   On: 06/28/2020 12:11   IR THORACENTESIS ASP PLEURAL SPACE W/IMG GUIDE  Result Date: 06/27/2020 INDICATION: Patient with a history of prostate cancer with extensive metastases now has new onset pleural effusions. Interventional radiology asked to perform a therapeutic and diagnostic thoracentesis. EXAM: ULTRASOUND GUIDED THORACENTESIS MEDICATIONS: 1% lidocaine 20 mL COMPLICATIONS: None immediate. PROCEDURE: An ultrasound guided thoracentesis was thoroughly discussed with the patient and questions answered. The benefits,  risks, alternatives and complications were also discussed. The patient understands and wishes to proceed with the procedure. Written consent was obtained. Ultrasound was performed to localize and mark an adequate pocket of fluid in the right chest. The area was then prepped and draped in the normal sterile fashion. 1% Lidocaine was used for local anesthesia. Under ultrasound guidance a 6 Fr Safe-T-Centesis catheter was introduced. Thoracentesis was performed. The catheter was removed and a dressing applied. FINDINGS: A total of approximately 1.6 L of dark yellow fluid was removed. Samples were sent to the laboratory as requested by the clinical team. IMPRESSION: Successful ultrasound guided right thoracentesis yielding 1.6 L of pleural fluid. Read by: Soyla Dryer, NP Electronically Signed   By: Markus Daft M.D.   On: 06/27/2020 12:29        Scheduled Meds: . Chlorhexidine Gluconate Cloth  6 each Topical Daily  . digoxin  0.25 mg Oral Daily  . levothyroxine  25 mcg Oral QAC breakfast  . lidocaine      . metoprolol succinate  100 mg Oral BID  . senna-docusate  2 tablet Oral BID   Continuous Infusions:   LOS: 3 days    Time spent: 38 minutes spent on chart review, discussion with nursing staff, consultants, updating family and interview/physical exam; more than 50% of that time was spent in counseling and/or coordination of care.    Naija Troost J [place] DO [facility] Available via Epic secure chat 7am-7pm After these hours, please refer to coverage provider listed on [email or web address] 06/28/2020, 1:15 PM

## 2020-06-28 NOTE — Procedures (Signed)
PROCEDURE SUMMARY:  Successful US guided left thoracentesis. Yielded 1.3 L of bright yellow fluid. Pt tolerated procedure well. No immediate complications.  Specimen sent for labs. CXR ordered; no post-procedure pneumothorax identified.   EBL < 2 mL  Theresa Duty, NP 06/28/2020 11:27 AM

## 2020-06-28 NOTE — Progress Notes (Addendum)
Physical Therapy Treatment Patient Details Name: Chris Morales MRN: [id number] DOB: 22-Nov-1955 Today's Date: 06/28/2020    History of Present Illness Pt adm with afib with rvr, acute hypoxic respiratory failure likely secondary to malignant bil pleural effusions, and lt UE weakness. Pt had recently been hospitalized in [place] with afib with rvr and resp failure requiring vent support and then left AMA before flying back to [facility] and presenting immediately to ED. 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 PMH - prostate CA with bony mets, C3-4 laminectomy with tumor removal 01/2019, afib    PT Comments    Pt making progress with mobility. Pt continues to demonstrate cognitive problems and will require supervision/assist at dc for these cognitive issues. Addendum: Walking past room after I had assisted pt to sit up in chair noted pt sliding down in recliner with legs off of chair. With nursing assisted pt to scoot up in chair and repositioned. Pt with very poor awareness of safety and deficits.   Follow Up Recommendations  CIR (May change with progress - better understand of dc location)     Equipment Recommendations  Rolling walker with 5" wheels    Recommendations for Other Services       Precautions / Restrictions Precautions Precautions: Fall    Mobility  Bed Mobility Overal bed mobility: Needs Assistance Bed Mobility: Rolling;Sidelying to Sit Rolling: Min guard Sidelying to sit: Min assist       General bed mobility comments: Assist to elevate trunk into sitting.   Transfers Overall transfer level: Needs assistance Equipment used: Rolling walker (2 wheeled) Transfers: Sit to/from Stand Sit to Stand: Mod assist         General transfer comment: Assist to bring hips up from low surface.   Ambulation/Gait Ambulation/Gait assistance: Min assist Gait Distance (Feet): 100 Feet Assistive device: Rolling walker (2 wheeled) Gait Pattern/deviations:  Step-through pattern;Decreased stride length;Narrow base of support;Drifts right/left Gait velocity: decr Gait velocity interpretation: <1.31 ft/sec, indicative of household ambulator General Gait Details: Assist for balance and support. Pt with some knee hyperextension   Stairs             Wheelchair Mobility    Modified Rankin (Stroke Patients Only)       Balance Overall balance assessment: Needs assistance Sitting-balance support: Feet supported Sitting balance-Leahy Scale: Fair     Standing balance support: Bilateral upper extremity supported;During functional activity Standing balance-Leahy Scale: Poor Standing balance comment: walker and min guard for static standing                            Cognition Arousal/Alertness: Awake/alert Behavior During Therapy: Flat affect Overall Cognitive Status: Impaired/Different from baseline Area of Impairment: Orientation;Following commands;Safety/judgement;Awareness;Problem solving                 Orientation Level: Situation     Following Commands: Follows one step commands with increased time Safety/Judgement: Decreased awareness of safety;Decreased awareness of deficits Awareness: Emergent Problem Solving: Slow processing;Requires verbal cues;Requires tactile cues General Comments: Pt incontinent of stool when I and nurse had entered room. Pt had not called or notified anyone.       Exercises      General Comments General comments (skin integrity, edema, etc.): VSS on RA with SpO2 93%      Pertinent Vitals/Pain Pain Assessment: No/denies pain    Home Living  Prior Function            PT Goals (current goals can now be found in the care plan section) Acute Rehab PT Goals Patient Stated Goal: return to independence Progress towards PT goals: Progressing toward goals    Frequency    Min 3X/week      PT Plan Current plan remains appropriate     Co-evaluation              AM-PAC PT "6 Clicks" Mobility   Outcome Measure  Help needed turning from your back to your side while in a flat bed without using bedrails?: A Little Help needed moving from lying on your back to sitting on the side of a flat bed without using bedrails?: A Little Help needed moving to and from a bed to a chair (including a wheelchair)?: A Lot Help needed standing up from a chair using your arms (e.g., wheelchair or bedside chair)?: A Lot Help needed to walk in hospital room?: A Little Help needed climbing 3-5 steps with a railing? : A Lot 6 Click Score: 15    End of Session Equipment Utilized During Treatment: Gait belt Activity Tolerance: Patient tolerated treatment well Patient left: in chair;with call bell/phone within reach;with chair alarm set Nurse Communication: Mobility status PT Visit Diagnosis: Unsteadiness on feet (R26.81);Muscle weakness (generalized) (M62.81)     Time: 1442-1500 PT Time Calculation (min) (ACUTE ONLY): 18 min  Charges:  $Gait Training: 8-22 mins                     [place] Pager [phone] Office [place] 06/28/2020, 4:53 PM

## 2020-06-28 NOTE — Progress Notes (Signed)
Brief Oncology Note:  I received a request for consult on Chris Morales.  I stopped by his room to to meet with him and to provide an outpatient follow-up at the [facility] cancer center for management of his metastatic prostate cancer.  However, the patient declined an appointment with [name].  He tells me that he wishes to be seen at [facility].  I have not made any appointments for [name] at this time.  If the patient changes mind, please contact us and we will arrange for outpatient follow-up at our clinic.  Mikey Bussing, DNP, AGPCNP-BC, AOCNP Mon/Tues/Thurs/Fri 7am-5pm; Off Wednesdays Cell: [phone]

## 2020-06-28 NOTE — Progress Notes (Signed)
Daily Progress Note   Patient Name: Chris Morales       Date: 06/28/2020 DOB: 06-Oct-1955  Age: 64 y.o. MRN#: [id number] Attending Physician: [place] Eric J, DO Primary Care Physician: Patient, No Pcp Per Admit Date: 06/24/2020  Reason for Consultation/Follow-up: Establishing goals of care  Subjective: Chart review performed. Asked primary RN to let me know when patient was ready to complete MOST form today. She explained he had just been taken down for thoracentesis. When patient returned, RN notified me that patient stated he was ready to complete MOST. When I arrived to patient's room a while later, patient stated that he never told the nurse he was ready to sign the form and that "someone lied to you." He was alert and oriented - the patient seems as if his congition is waxing and waning? The more discussions we have I am uncertain about his ability to make complex medical decisions for himself. Patient denied pain and stated he was ready to eat lunch.  I spoke with the primary RN - she had no acute concerns but did agree the patient at times seemed forgetful/not making sense.   I called the patient's brother/Bruce. I introduced myself and PMT. Bruce had a clear understanding of the patient's current medical situation. I expressed my concern in detail about patient's cognition - Bruce explained that he felt the same way during his visit with the patient yesterday. I provided updates and answered questions around discharge options, including CIR option. Bruce expressed desire to be updated on discharge plan and to be a part of the discussion - I told him I would let staff know. Bruce asked that we go ahead and consult oncology while the patient is admitted to gain more information on what their  options are - he is open and willing to continue [place] conversations around updated information as it comes in. Explained and offered outpatient [place] was appreciative and agreeable.   Bruce explains that the patient does have a Living Will and HCPOA - he will email me the documents today and I will ensure they are scanned into [employer].  All questions and concerns addressed. Encouraged to call with questions and/or concerns. PMT card provided.   Length of Stay: 3  Current Medications: Scheduled Meds:  . Chlorhexidine Gluconate Cloth  6  each Topical Daily  . digoxin  0.25 mg Oral Daily  . levothyroxine  25 mcg Oral QAC breakfast  . lidocaine      . metoprolol succinate  100 mg Oral BID  . senna-docusate  2 tablet Oral BID    Continuous Infusions:   PRN Meds: acetaminophen, lidocaine (PF), lidocaine, ondansetron (ZOFRAN) IV, polyethylene glycol  Physical Exam Vitals and nursing note reviewed.  Constitutional:      General: He is not in acute distress. Pulmonary:     Effort: No respiratory distress.  Skin:    General: Skin is warm and dry.  Neurological:     Mental Status: He is alert.     Motor: Weakness present.  Psychiatric:        Behavior: Behavior is cooperative.        Cognition and Memory: Cognition is impaired. Memory is impaired.             Vital Signs: BP 116/81 (BP Location: Right Arm)   Pulse 81   Temp 98 F (36.7 C) (Oral)   Resp 20   Ht 6\' 1"  (1.854 m)   Wt 70.5 kg   SpO2 98%   BMI 20.51 kg/m  SpO2: SpO2: 98 % O2 Device: O2 Device: Nasal Cannula O2 Flow Rate: O2 Flow Rate (L/min): 2 L/min  Intake/output summary:   Intake/Output Summary (Last 24 hours) at 06/28/2020 1357 Last data filed at 06/28/2020 0600 Gross per 24 hour  Intake 720 ml  Output 625 ml  Net 95 ml   LBM: Last BM Date: 06/27/20 Baseline Weight: Weight: 68 kg Most recent weight: Weight: 70.5 kg       Palliative Assessment/Data: PPS 70%    Flowsheet Rows      Most Recent Value  Intake Tab  Referral Department Hospitalist  Unit at Time of Referral Med/Surg Unit  Palliative Care Primary Diagnosis Cancer  Date Notified 06/25/20  Palliative Care Type New Palliative care  Reason for referral Clarify Goals of Care  Date of Admission 06/24/20  Date first seen by Palliative Care 06/27/20  # of days Palliative referral response time 2 Day(s)  # of days IP prior to Palliative referral 1  Clinical Assessment  Psychosocial & Spiritual Assessment  Social Work Plan of Care Referral to community resources  Palliative Care Outcomes  Patient/Family meeting held? Yes  Who was at the meeting? patient  Palliative Care Outcomes Clarified goals of care, Counseled regarding hospice, Provided advance care planning, Provided psychosocial or spiritual support      Patient Active Problem List   Diagnosis Date Noted  . Palliative care by specialist   . Goals of care, counseling/discussion   . Encounter for hospice care discussion   . Full code status   . Atrial fibrillation with rapid ventricular response ([place]) 06/25/2020  . 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 Left-sided weakness 06/25/2020  . Foley catheter in place on admission 06/25/2020  . Pleural effusion, bilateral 06/25/2020  . Normocytic anemia 06/25/2020  . Elevated d-dimer 06/25/2020  . 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 Atrial fibrillation, chronic ([place]) 06/25/2020  . Malignant neoplasm of prostate metastatic to bone ([place]) 02/22/2019  . Metastasis to spinal column ([place]) 02/22/2019  . AKI (acute kidney injury) ([place]) 02/20/2019  . Prostate CA ([place]) 02/20/2019  . Cervical myelopathy ([place]) 02/19/2019    Palliative Care Assessment & Plan   Patient Profile: 64 y.o. male  with past medical history of prostate cancer with extensive bony metastases, atrial fibrillation, hypothyroidism  admitted on 06/24/2020 with atrial fibrillation with RVR and bilateral pulmoary effusions.   Patient recently  hospitalized mid [facility] health in both [place] from 1017-06/24/2020. During the hospitalization, patient was found to be in atrial fibrillation with RVR and respiratory failure requiring admission to the intensive care unit and ventilatory support.Patient was eventually extubated and subsequently was suffering from left-sided upper extremity weakness and decided to leave AMA on 06/24/2020 and he took a flight from [place] to [place] where his brother lives. Immediately after landing, patient proceeded to the [name] ED for evaluation.  Patient faces treatment option decisions, advanced directive decisions, and anticipatory care needs.  Assessment: Atrial fibrillation with RVR Malignant neoplasm of prostate metastatic to bone Left-sided weakness Bilateral pleural effusion Elevated d-dimer  Recommendations/Plan: Continue full scope medical treatment Continue full code status Unsure at this time if patient is able to make complex medical decisions for himself - he may need evaluation if this becomes an issue Brother/Bruce stated he would email patient's Living Will and HCPOA documents - once received I will scan copies into [employer] Brother would like to proceed with oncology evaluation while patient is in house - he is open to further [place] discussions pending their updated information/recommendations  It is important the patient be set up with outpatient Palliative Care before discharge - [facility] consult was placed yesterday PMT will continue to follow holistically  Goals of Care and Additional Recommendations: Limitations on Scope of Treatment: Full Scope Treatment  Code Status:    Code Status Orders  (From admission, onward)         Start     Ordered   06/25/20 0203  Full code  Continuous        06/25/20 0202        Code Status History    Date Active Date Inactive Code Status Order ID Comments User Context   02/19/2019 1327 02/23/2019 1718 Full Code [id number]   Earnie Larsson, MD ED   Advance Care Planning Activity      Prognosis:  Unable to determine  Discharge Planning: [place] for rehab with Palliative care service follow-up  Care plan was discussed with Dr. [place] [facility], primary RN, brother/Bruce, CIR RN representative, PT  Thank you for allowing the Palliative Medicine Team to assist in the care of this patient.   Total Time 37 minutes Prolonged Time Billed  no       Greater than 50%  of this time was spent counseling and coordinating care related to the above assessment and plan.  Lin Landsman, NP  Please contact Palliative Medicine Team phone at [phone] for questions and concerns.

## 2020-06-29 DIAGNOSIS — I4891 Unspecified atrial fibrillation: Secondary | ICD-10-CM | POA: Diagnosis not present

## 2020-06-29 LAB — IRON AND TIBC
Iron: 55 ug/dL (ref 45–182)
Saturation Ratios: 29 % (ref 17.9–39.5)
TIBC: 192 ug/dL — ABNORMAL LOW (ref 250–450)
UIBC: 137 ug/dL

## 2020-06-29 LAB — FOLATE: Folate: 6.6 ng/mL (ref >5.9)

## 2020-06-29 LAB — CBC
HCT: 24.7 % — ABNORMAL LOW (ref 39.0–52.0)
Hemoglobin: 7.2 g/dL — ABNORMAL LOW (ref 13.0–17.0)
MCH: 27.4 pg (ref 26.0–34.0)
MCHC: 29.1 g/dL — ABNORMAL LOW (ref 30.0–36.0)
MCV: 93.9 fL (ref 80.0–100.0)
Platelets: 253 10*3/uL (ref 150–400)
RBC: 2.63 MIL/uL — ABNORMAL LOW (ref 4.22–5.81)
RDW: 22.5 % — ABNORMAL HIGH (ref 11.5–15.5)
WBC: 8.3 10*3/uL (ref 4.0–10.5)
nRBC: 2.3 % — ABNORMAL HIGH (ref 0.0–0.2)

## 2020-06-29 LAB — BASIC METABOLIC PANEL
Anion gap: 9 (ref 5–15)
BUN: 15 mg/dL (ref 8–23)
CO2: 21 mmol/L — ABNORMAL LOW (ref 22–32)
Calcium: 7.3 mg/dL — ABNORMAL LOW (ref 8.9–10.3)
Chloride: 109 mmol/L (ref 98–111)
Creatinine, Ser: 0.62 mg/dL (ref 0.61–1.24)
GFR, Estimated: >60 mL/min (ref >60)
Glucose, Bld: 87 mg/dL (ref 70–99)
Potassium: 4.1 mmol/L (ref 3.5–5.1)
Sodium: 139 mmol/L (ref 135–145)

## 2020-06-29 LAB — MAGNESIUM: Magnesium: 1.9 mg/dL (ref 1.7–2.4)

## 2020-06-29 LAB — PREPARE RBC (CROSSMATCH)

## 2020-06-29 LAB — FERRITIN: Ferritin: 1549 ng/mL — ABNORMAL HIGH (ref 24–336)

## 2020-06-29 LAB — VITAMIN B12: Vitamin B-12: 633 pg/mL (ref 180–914)

## 2020-06-29 MED ORDER — SODIUM CHLORIDE 0.9% IV SOLUTION: Freq: Once | INTRAVENOUS | Status: AC

## 2020-06-29 NOTE — Progress Notes (Addendum)
PROGRESS NOTE    Chris Morales  WLN:[id number] DOB: 04-Apr-1956 DOA: 06/24/2020 PCP: Patient, No Pcp Per    Brief Narrative:  Chris Morales is a 64 year old male with past medical history notable for prostate cancer with extensive bony metastases, permanent atrial fibrillation, hypothyroidism, who presented to [facility] ED with shortness of breath, palpitations, lower extremity edema and left-sided weakness.  Over the past 9 days, he reports progressive shortness of breath that is severe; worse with exertion improved with rest.  Patient recently hospitalized mid [facility] health in both [place] from 1017-06/24/2020.  During the hospitalization, patient was found to be in atrial fibrillation with RVR and respiratory failure requiring admission to the intensive care unit and ventilatory support.  Patient was tried on multiple AV nodal blocking agents including amiodarone, digoxin and finally was placed on scheduled metoprolol therapy.  Attempts were made at chemical cardioversion and failed.  Patient was eventually extubated and Chris Morales lies he was suffering from left-sided upper extremity weakness and decided to leave AMA on 06/24/2020 and he took a flight from [place] to [place] where his brother lives.  Immediately after landing, patient proceeded to the [name] for evaluation.  In the ED, patient noted to be in A. fib with RVR with chest x-ray revealing substantial bilateral pulmonary effusions with noted metastatic lesions to the bones.  Patient was found to have an elevated D-dimer 19.33.  Hospitalist service consulted for admission for further evaluation and treatment of severe shortness of breath, respiratory failure requiring oxygen therapy and A. fib with RVR.   Assessment & Plan:   Principal Problem:   Atrial fibrillation with rapid ventricular response (HCC) Active Problems:   Malignant neoplasm of prostate metastatic to bone [facility])   Euthyroid sick syndrome    Left-sided weakness   Foley catheter in place on admission   Pleural effusion, bilateral   Normocytic anemia   Elevated d-dimer   Nutmeg liver   Palliative care by specialist   Goals of care, counseling/discussion   Encounter for hospice care discussion   Full code status   Pleural effusion   SOB (shortness of breath)   Status post thoracentesis   Atrial fibrillation with RVR Patient presenting with persistent A. fib with RVR.  Recent extensive hospitalization in [place] with difficult to control A. fib despite multiple AV nodal blocking agents including amiodarone, digoxin, failed chemical cardioversion.  On presentation, patient was once again in A. fib with RVR and started on a Cardizem drip; now which is titrated off. --Continue metoprolol succinate 100 mg p.o. BID, hold for SBP<90 or HR <60 --Digoxin 0.25 mg p.o. daily --Continue to hold home Eliquis --Continue to monitor on telemetry  Acute hypoxic respiratory failure 2/2 to decompensated diastolic congestive heart failure with likely exudative bilateral pleural effusions likely malignant Patient presenting with progressive shortness of breath, slightly hypoxic on admission requiring 2 L nasal cannula.  Recent hospitalization requiring ventilatory support.  Chest x-ray notable for substantial bilateral pleural effusions, suspect likely malignant etiology.  CT angiogram chest negative for pulmonary embolism.  No signs of infectious etiology.  TTE 06/25/2020 with LVEF 50-55%, no LV wall motion abnormalities, LA mildly dilated, RA mildly dilated, large pleural effusion on left, moderate TR, mild MR, IVC dilated. Underwent IR ultrasound-guided right thoracentesis on 06/27/2020 with 1.6 L dark yellow fluid removed allergy with reactive mesothelial cells present and ultrasound-guided left thoracentesis on 06/28/2020 with 1.3 L bright yellow fluid removed. --Holding Eliquis --Right Pleural fluid consistent  with exudative effusion, culture  shows no growth x 2 days --Left pleural fluid culture no growth x24 hours --Continue supplemental oxygen, maintain SPO2 greater than 92%; on 2 L nasal cannula, with SpO2 100%  Left-sided weakness Patient reports new left upper extremity weakness.  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  Case was discussed with neurology by previous hospitalist, unlikely dural based met versus meningioma as likely cause, and recommended further imaging with MRI C-spine.  Patient unable to tolerate MR C-spine today once again. --PT/OT recommends CIR --Pending CIR evaluation/insurance authorization  Prostate cancer with bony metastasis Diagnosed with prostate cancer June 2020, but has yet to receive treatment.  Imaging studies during hospitalization notable for extensive osseous metastatic disease.  Patient desires to establish care with [facility]; and after discussion with patient's brother, he requests oncology evaluation inpatient as patient would benefit from more local care.  Patient initially hesitant for evaluation here by oncology, but now would like to proceed with consult. --Palliative care following for poor prognosis, plan to continue to follow outpatient --Medical oncology consulted for evaluation and treatment options  Hypothyroidism versus sick euthyroid syndrome Patient with mild elevation of TSH, started on Synthroid in [place]. --TSH 7.345 --Continue levothyroxine 25 mcg p.o. 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  Iron 46, TIBC 141 (low), ferritin 590 (high); consistent with anemia of chronic disease, likely secondary to cancer/metastases. --Hemoglobin 9.0>7.4>7.2 --Check iron, TIBC, ferritin, B12, folate level --Transfuse 1 unit PRBC today --Continue to monitor  hemoglobin daily --Transfuse for hemoglobin less than 7.0  Elevated D-dimer D-dimer elevated 19.33.  CT angiogram chest negative for pulmonary embolism.  Records from [place] facility notes CT abdomen/pelvis revealing nutmeg liver concerning for portal vein thrombosis.  Ultrasound Doppler liver unremarkable.  Etiology of his elevated D-dimer likely secondary to his underlying malignancy.   DVT prophylaxis: SCDs Code Status: Full code Family Communication: No family present at bedside; updated patient's brother, Dr. Chrissie Noa via telephone this afternoon  Disposition Plan:  Status is: Inpatient  Remains inpatient appropriate because:Ongoing active pain requiring inpatient pain management, Ongoing diagnostic testing needed not appropriate for outpatient work up, Unsafe d/c plan, IV treatments appropriate due to intensity of illness or inability to take PO and Inpatient level of care appropriate due to severity of illness   Dispo: The patient is from: Home              Anticipated d/c is to: CIR vs home with [facility] and palliative to follow outpatient              Anticipated d/c date is: 2 days              Patient currently is not medically stable to d/c.    Consultants:   Palliative care  Interventional radiology  Medical oncology  Procedures:   None  Antimicrobials:   None   Subjective: Patient seen and examined bedside, lying in bed.  Continues with mild shortness of breath, improved following thoracentesis.  Also continues with significant fatigue today.  Discussed with patient need for initial oncology consult while he is here, he is more amenable to setting up an appointment when he is closer for discharge.  No other complaints or concerns at this time. Denies fever/chills/night sweats, no nausea/vomiting/diarrhea, no chest pain, no palpitations, no shortness of breath,  no abdominal pain, no cough/congestion.  No acute events overnight per nursing  staff.  Objective: Vitals:   06/29/20 0524 06/29/20 1030 06/29/20 1248 06/29/20 1315  BP:  (!) 110/93 103/64 103/67  Pulse:  (!) 124 84 84  Resp:  16 18 17   Temp: 98.1 F (36.7 C) 98.3 F (36.8 C) 98.2 F (36.8 C) 97.9 F (36.6 C)  TempSrc: Oral Oral Oral Oral  SpO2:  100% 100% 100%  Weight: 68.4 kg     Height:        Intake/Output Summary (Last 24 hours) at 06/29/2020 1421 Last data filed at 06/29/2020 1000 Gross per 24 hour  Intake 230.34 ml  Output 1150 ml  Net -919.66 ml   Filed Weights   06/26/20 0420 06/27/20 0500 06/29/20 0524  Weight: 70.2 kg 70.5 kg 68.4 kg    Examination:  General exam: Appears calm and comfortable, thin/cachectic in appearance Respiratory system: Breath sounds slightly decreased bilateral bases, no wheezing, no rhonchi, normal respiratory effort, on 2 L nasal cannula with SPO2 100% Cardiovascular system: S1 & S2 heard, irregularly irregular rhythm, normal rate. No JVD, murmurs, rubs, gallops or clicks.  Trace to 1+ pitting edema bilaterally Gastrointestinal system: Abdomen is nondistended, soft and nontender. No organomegaly or masses felt. Normal bowel sounds heard. Central nervous system: Alert and oriented. No focal neurological deficits. Extremities: Symmetric 5 x 5 power. Skin: No rashes, lesions or ulcers Psychiatry: Judgement and insight appear normal. Mood & affect appropriate.     Data Reviewed: I have personally reviewed following labs and imaging studies  CBC: Recent Labs  Lab 06/24/20 1852 06/28/20 0202 06/29/20 0408  WBC 8.4 7.4 8.3  HGB 9.0* 7.4* 7.2*  HCT 31.4* 25.0* 24.7*  MCV 94.0 91.9 93.9  PLT 232 245 315   Basic Metabolic Panel: Recent Labs  Lab 06/24/20 1852 06/25/20 1546 06/28/20 0202 06/29/20 0408  NA 140  --  139 139  K 3.9  --  4.0 4.1  CL 107  --  108 109  CO2 25  --  23 21*  GLUCOSE 153*  --  108* 87  BUN 9  --  14 15  CREATININE 0.61  --  0.68 0.62  CALCIUM 7.5*  --  7.3* 7.3*  MG  --  2.0  1.9 1.9   GFR: Estimated Creatinine Clearance: 90.3 mL/min (by C-G formula based on SCr of 0.62 mg/dL). Liver Function Tests: Recent Labs  Lab 06/24/20 1852 06/27/20 1520  AST 49*  --   ALT 29  --   ALKPHOS 2,755*  --   BILITOT 0.7  --   PROT 5.6* 5.3*  ALBUMIN 1.9*  --    No results for input(s): LIPASE, AMYLASE in the last 168 hours. No results for input(s): AMMONIA in the last 168 hours. Coagulation Profile: No results for input(s): INR, PROTIME in the last 168 hours. Cardiac Enzymes: No results for input(s): CKTOTAL, CKMB, CKMBINDEX, TROPONINI in the last 168 hours. BNP (last 3 results) No results for input(s): PROBNP in the last 8760 hours. HbA1C: No results for input(s): HGBA1C in the last 72 hours. CBG: No results for input(s): GLUCAP in the last 168 hours. Lipid Profile: No results for input(s): CHOL, HDL, LDLCALC, TRIG, CHOLHDL, LDLDIRECT in the last 72 hours. Thyroid Function Tests: No results for input(s): TSH, T4TOTAL, FREET4, T3FREE, THYROIDAB in the last 72 hours. Anemia Panel: Recent Labs    06/29/20 1021  VITAMINB12 633  FOLATE 6.6  FERRITIN 1,549*  TIBC 192*  IRON 55   Sepsis Labs: Recent Labs  Lab 06/25/20 1933 06/26/20 0735 06/26/20 1038 06/27/20 0202  PROCALCITON 0.43  --   --   --   LATICACIDVEN  --  1.7 2.6* 1.2    Recent Results (from the past 240 hour(s))  Urine culture     Status: None   Collection Time: 06/24/20  9:57 PM   Specimen: Urine, Catheterized  Result Value Ref Range Status   Specimen Description URINE, CATHETERIZED  Final   Special Requests NONE  Final   Culture   Final    NO GROWTH Performed at [facility], [place] [address] [place], [place] [address]    Report Status 06/26/2020 FINAL  Final  Respiratory Panel by RT PCR (Flu A&B, Covid) - Nasopharyngeal Swab     Status: None   Collection Time: 06/25/20  4:00 PM   Specimen: Nasopharyngeal Swab  Result Value Ref Range Status   SARS Coronavirus 2 by RT PCR  NEGATIVE NEGATIVE Final    Comment: (NOTE) SARS-CoV-2 target nucleic acids are NOT DETECTED.  The SARS-CoV-2 RNA is generally detectable in upper respiratoy specimens during the acute phase of infection. The lowest concentration of SARS-CoV-2 viral copies this assay can detect is 131 copies/mL. A negative result does not preclude SARS-Cov-2 infection and should not be used as the sole basis for treatment or other patient management decisions. A negative result may occur with  improper specimen collection/handling, submission of specimen other than nasopharyngeal swab, presence of viral mutation(s) within the areas targeted by this assay, and inadequate number of viral copies (<131 copies/mL). A negative result must be combined with clinical observations, patient history, and epidemiological information. The expected result is Negative.  Fact Sheet for Patients:  PinkCheek.be  Fact Sheet for Healthcare Providers:  GravelBags.it  This test is no t yet approved or cleared by the [place] FDA and  has been authorized for detection and/or diagnosis of SARS-CoV-2 by FDA under an Emergency Use Authorization (EUA). This EUA will remain  in effect (meaning this test can be used) for the duration of the COVID-19 declaration under Section 564(b)(1) of the Act, 21 U.S.C. section 360bbb-3(b)(1), unless the authorization is terminated or revoked sooner.     Influenza A by PCR NEGATIVE NEGATIVE Final   Influenza B by PCR NEGATIVE NEGATIVE Final    Comment: (NOTE) The Xpert Xpress SARS-CoV-2/FLU/RSV assay is intended as an aid in  the diagnosis of influenza from Nasopharyngeal swab specimens and  should not be used as a sole basis for treatment. Nasal washings and  aspirates are unacceptable for Xpert Xpress SARS-CoV-2/FLU/RSV  testing.  Fact Sheet for Patients: PinkCheek.be  Fact Sheet for  Healthcare Providers: GravelBags.it  This test is not yet approved or cleared by the [place] FDA and  has been authorized for detection and/or diagnosis of SARS-CoV-2 by  FDA under an Emergency Use Authorization (EUA). This EUA will remain  in effect (meaning this test can be used) for the duration of the  Covid-19 declaration under Section 564(b)(1) of the Act, 21  U.S.C. section 360bbb-3(b)(1), unless the authorization is  terminated or revoked. Performed at [facility], [place] [address], [place], [place] [address]   SARS CORONAVIRUS 2 (TAT 6-24 HRS) Nasopharyngeal Nasopharyngeal Swab     Status: None   Collection Time: 06/26/20 11:40 AM   Specimen: Nasopharyngeal Swab  Result Value Ref Range Status   SARS Coronavirus 2 NEGATIVE NEGATIVE Final    Comment: (  NOTE) SARS-CoV-2 target nucleic acids are NOT DETECTED.  The SARS-CoV-2 RNA is generally detectable in upper and lower respiratory specimens during the acute phase of infection. Negative results do not preclude SARS-CoV-2 infection, do not rule out co-infections with other pathogens, and should not be used as the sole basis for treatment or other patient management decisions. Negative results must be combined with clinical observations, patient history, and epidemiological information. The expected result is Negative.  Fact Sheet for Patients: SugarRoll.be  Fact Sheet for Healthcare Providers: https://www.woods-mathews.com/  This test is not yet approved or cleared by the [place] FDA and  has been authorized for detection and/or diagnosis of SARS-CoV-2 by FDA under an Emergency Use Authorization (EUA). This EUA will remain  in effect (meaning this test can be used) for the duration of the COVID-19 declaration under Se ction 564(b)(1) of the Act, 21 U.S.C. section 360bbb-3(b)(1), unless the authorization is terminated or revoked  sooner.  Performed at [facility], [place] [address] [place], [place] [address]   Gram stain     Status: None   Collection Time: 06/27/20 11:40 AM   Specimen: PATH Cytology Pleural fluid  Result Value Ref Range Status   Specimen Description PLEURAL FLUID  Final   Special Requests NONE  Final   Gram Stain   Final    WBC PRESENT,BOTH PMN AND MONONUCLEAR NO ORGANISMS SEEN CYTOSPIN SMEAR Performed at [facility], [place] [address], [place], [place] [address]    Report Status 06/27/2020 FINAL  Final  Culture, body fluid-bottle     Status: None (Preliminary result)   Collection Time: 06/27/20 11:40 AM   Specimen: Pleura  Result Value Ref Range Status   Specimen Description PLEURAL FLUID  Final   Special Requests NONE  Final   Culture   Final    NO GROWTH 2 DAYS Performed at [place] [address] [place], [place] [address]    Report Status PENDING  Incomplete  Culture, body fluid-bottle     Status: None (Preliminary result)   Collection Time: 06/28/20 10:53 AM   Specimen: Pleura  Result Value Ref Range Status   Specimen Description PLEURAL FLUID  Final   Special Requests LEFT LUNG  Final   Culture   Final    NO GROWTH < 24 HOURS Performed at [facility], [place] [address], [place], [place] [address]    Report Status PENDING  Incomplete  Gram stain     Status: None   Collection Time: 06/28/20 10:53 AM   Specimen: Pleura  Result Value Ref Range Status   Specimen Description PLEURAL FLUID  Final   Special Requests LEFT LUNG  Final   Gram Stain   Final    WBC PRESENT,BOTH PMN AND MONONUCLEAR NO ORGANISMS SEEN CYTOSPIN SMEAR Performed at [facility], [address] [place], [place] [address]    Report Status 06/28/2020 FINAL  Final         Radiology Studies: DG Chest 1 View  Result Date: 06/28/2020 CLINICAL DATA:  Left thoracentesis. EXAM: CHEST  1 VIEW COMPARISON:  06/28/2020. FINDINGS: Mediastinum and hilar structures normal.  Stable cardiomegaly. Bilateral interstitial prominence again noted with interim improvement from prior exam. Findings suggests improving CHF. Improved left pleural effusion. Tiny right pleural effusion. No pneumothorax post thoracentesis. IMPRESSION: Findings consistent with improving CHF. Improved left pleural effusion. No pneumothorax post thoracentesis. Tiny right pleural effusion. Electronically Signed   By: Marcello Moores  Register  On: 06/28/2020 10:55   DG CHEST PORT 1 VIEW  Result Date: 06/28/2020 CLINICAL DATA:  Shortness of breath. EXAM: PORTABLE CHEST 1 VIEW COMPARISON:  06/27/2020. FINDINGS: Mediastinum and heart size normal. Progression of diffuse left lung interstitial infiltrates/edema. Diffuse right lung interstitial infiltrates/edema noted on today's exam. Persistent left-sided pleural effusion without interim change. No pneumothorax. Extensive sclerotic bony metastatic disease again noted. IMPRESSION: 1. Progression of diffuse left lung interstitial infiltrates/edema. Diffuse right lung interstitial infiltrates/edema noted on today's exam. Persistent left-sided pleural effusion without interim change. 2. Extensive sclerotic bony metastatic disease again noted. Electronically Signed   By: Marcello Moores  Register   On: 06/28/2020 05:52   IR THORACENTESIS ASP PLEURAL SPACE W/IMG GUIDE  Result Date: 06/28/2020 INDICATION: Patient with a history of prostate cancer with extensive metastases now has new onset pleural effusions. Interventional radiology asked to perform a therapeutic and diagnostic thoracentesis. EXAM: ULTRASOUND GUIDED THORACENTESIS MEDICATIONS: 1% lidocaine 10 mL COMPLICATIONS: None immediate. PROCEDURE: An ultrasound guided thoracentesis was thoroughly discussed with the patient and questions answered. The benefits, risks, alternatives and complications were also discussed. The patient understands and wishes to proceed with the procedure. Written consent was obtained. Ultrasound was  performed to localize and mark an adequate pocket of fluid in the left chest. The area was then prepped and draped in the normal sterile fashion. 1% Lidocaine was used for local anesthesia. Under ultrasound guidance a 6 Fr Safe-T-Centesis catheter was introduced. Thoracentesis was performed. The catheter was removed and a dressing applied. FINDINGS: A total of approximately 1.3 L of bright yellow fluid was removed. Samples were sent to the laboratory as requested by the clinical team. IMPRESSION: Successful ultrasound guided left thoracentesis yielding 1.3 L of pleural fluid. Read by: Soyla Dryer, NP Electronically Signed   By: Corrie Mckusick D.O.   On: 06/28/2020 12:11        Scheduled Meds: . Chlorhexidine Gluconate Cloth  6 each Topical Daily  . digoxin  0.25 mg Oral Daily  . levothyroxine  25 mcg Oral QAC breakfast  . metoprolol succinate  100 mg Oral BID  . senna-docusate  2 tablet Oral BID   Continuous Infusions:   LOS: 4 days    Time spent: 36 minutes spent on chart review, discussion with nursing staff, consultants, updating family and interview/physical exam; more than 50% of that time was spent in counseling and/or coordination of care.    Garreth Burnsworth J [place] DO [facility] Available via Epic secure chat 7am-7pm After these hours, please refer to coverage provider listed on [email or web address] 06/29/2020, 2:21 PM

## 2020-06-30 DIAGNOSIS — I4891 Unspecified atrial fibrillation: Secondary | ICD-10-CM | POA: Diagnosis not present

## 2020-06-30 DIAGNOSIS — Z789 Other specified health status: Secondary | ICD-10-CM | POA: Diagnosis not present

## 2020-06-30 DIAGNOSIS — R7989 Other specified abnormal findings of blood chemistry: Secondary | ICD-10-CM | POA: Diagnosis not present

## 2020-06-30 DIAGNOSIS — Z7189 Other specified counseling: Secondary | ICD-10-CM | POA: Diagnosis not present

## 2020-06-30 LAB — CBC
HCT: 27.4 % — ABNORMAL LOW (ref 39.0–52.0)
Hemoglobin: 8.3 g/dL — ABNORMAL LOW (ref 13.0–17.0)
MCH: 28 pg (ref 26.0–34.0)
MCHC: 30.3 g/dL (ref 30.0–36.0)
MCV: 92.6 fL (ref 80.0–100.0)
Platelets: 275 10*3/uL (ref 150–400)
RBC: 2.96 MIL/uL — ABNORMAL LOW (ref 4.22–5.81)
RDW: 21.9 % — ABNORMAL HIGH (ref 11.5–15.5)
WBC: 7.5 10*3/uL (ref 4.0–10.5)
nRBC: 2.7 % — ABNORMAL HIGH (ref 0.0–0.2)

## 2020-06-30 LAB — BASIC METABOLIC PANEL
Anion gap: 7 (ref 5–15)
BUN: 13 mg/dL (ref 8–23)
CO2: 22 mmol/L (ref 22–32)
Calcium: 7.4 mg/dL — ABNORMAL LOW (ref 8.9–10.3)
Chloride: 112 mmol/L — ABNORMAL HIGH (ref 98–111)
Creatinine, Ser: 0.53 mg/dL — ABNORMAL LOW (ref 0.61–1.24)
GFR, Estimated: >60 mL/min (ref >60)
Glucose, Bld: 94 mg/dL (ref 70–99)
Potassium: 4 mmol/L (ref 3.5–5.1)
Sodium: 141 mmol/L (ref 135–145)

## 2020-06-30 LAB — TYPE AND SCREEN
ABO/RH(D): O POS
Antibody Screen: NEGATIVE
Unit division: 0

## 2020-06-30 LAB — BPAM RBC
Blood Product Expiration Date: 202111062359
ISSUE DATE / TIME: 202110301242
Unit Type and Rh: 5100

## 2020-06-30 LAB — MAGNESIUM: Magnesium: 1.9 mg/dL (ref 1.7–2.4)

## 2020-06-30 LAB — CD19 AND CD20, FLOW CYTOMETRY

## 2020-06-30 LAB — PSA: Prostatic Specific Antigen: 86.26 ng/mL — ABNORMAL HIGH (ref 0.00–4.00)

## 2020-06-30 MED ORDER — SODIUM CHLORIDE 0.9 % IV BOLUS
500.0000 mL | Freq: Once | INTRAVENOUS | Status: AC
  Administered 2020-06-30: 500 mL via INTRAVENOUS

## 2020-06-30 MED ORDER — LEUPROLIDE ACETATE 7.5 MG IM KIT
7.5000 mg | PACK | Freq: Once | INTRAMUSCULAR | Status: AC
  Administered 2020-07-01: 7.5 mg via INTRAMUSCULAR
  Filled 2020-06-30: qty 7.5

## 2020-06-30 MED ORDER — BICALUTAMIDE 50 MG PO TABS
50.0000 mg | ORAL_TABLET | Freq: Every day | ORAL | Status: DC
  Administered 2020-06-30 – 2020-07-05 (×6): 50 mg via ORAL
  Filled 2020-06-30 (×6): qty 1

## 2020-06-30 MED ORDER — METOPROLOL TARTRATE 5 MG/5ML IV SOLN
5.0000 mg | Freq: Once | INTRAVENOUS | Status: AC
  Administered 2020-06-30: 5 mg via INTRAVENOUS
  Filled 2020-06-30: qty 5

## 2020-06-30 NOTE — Progress Notes (Signed)
   06/30/20 0728  Assess: MEWS Score  Temp 98.3 F (36.8 C)  BP 120/89  Pulse Rate (!) 124  ECG Heart Rate (!) 124  Resp 13  Level of Consciousness Alert  SpO2 100 %  O2 Device Nasal Cannula  O2 Flow Rate (L/min) 2 L/min  Assess: MEWS Score  MEWS Temp 0  MEWS Systolic 0  MEWS Pulse 2  MEWS RR 1  MEWS LOC 0  MEWS Score 3  MEWS Score Color Yellow  Assess: if the MEWS score is Yellow or Red  Were vital signs taken at a resting state? Yes  Focused Assessment Change from prior assessment (see assessment flowsheet) (tachycardic, diarrhea)  Early Detection of Sepsis Score *See Row Information* Low  MEWS guidelines implemented *See Row Information* Yes  Treat  MEWS Interventions Escalated (See documentation below);Administered scheduled meds/treatments  Pain Scale 0-10  Pain Score 0  Take Vital Signs  Increase Vital Sign Frequency  Yellow: Q 2hr X 2 then Q 4hr X 2, if remains yellow, continue Q 4hrs  Escalate  MEWS: Escalate Yellow: discuss with charge nurse/RN and consider discussing with provider and RRT  Notify: Charge Nurse/RN  Name of Charge Nurse/RN Notified [place], RN  Date Charge Nurse/RN Notified 06/30/20  Time Charge Nurse/RN Notified [id number]  Notify: Provider  Provider Name/Title [place] DO  Date Provider Notified 06/30/20  Time Provider Notified [phone]  Notification Type  (secure chat)  Notification Reason Change in status (tachycardic in 120s, diarrhea)  Response See new orders (IV metoprolol 5 mg)  Date of Provider Response 06/30/20  Time of Provider Response [phone]  Document  Patient Outcome Other (Comment) (stable in department, will continue to monitor)  Progress note created (see row info) Yes

## 2020-06-30 NOTE — Progress Notes (Signed)
PROGRESS NOTE    Chris Morales  WUX:[id number] DOB: 1956-03-09 DOA: 06/24/2020 PCP: Patient, No Pcp Per    Brief Narrative:  Chris Morales is a 64 year old male with past medical history notable for prostate cancer with extensive bony metastases, permanent atrial fibrillation, hypothyroidism, who presented to [facility] ED with shortness of breath, palpitations, lower extremity edema and left-sided weakness.  Over the past 9 days, he reports progressive shortness of breath that is severe; worse with exertion improved with rest.  Patient recently hospitalized mid [facility] health in both [place] from 1017-06/24/2020.  During the hospitalization, patient was found to be in atrial fibrillation with RVR and respiratory failure requiring admission to the intensive care unit and ventilatory support.  Patient was tried on multiple AV nodal blocking agents including amiodarone, digoxin and finally was placed on scheduled metoprolol therapy.  Attempts were made at chemical cardioversion and failed.  Patient was eventually extubated and [name] lies he was suffering from left-sided upper extremity weakness and decided to leave AMA on 06/24/2020 and he took a flight from [place] to [place] where his brother lives.  Immediately after landing, patient proceeded to the [name] for evaluation.  In the ED, patient noted to be in A. fib with RVR with chest x-ray revealing substantial bilateral pulmonary effusions with noted metastatic lesions to the bones.  Patient was found to have an elevated D-dimer 19.33.  Hospitalist service consulted for admission for further evaluation and treatment of severe shortness of breath, respiratory failure requiring oxygen therapy and A. fib with RVR.   Assessment & Plan:   Principal Problem:   Atrial fibrillation with rapid ventricular response (HCC) Active Problems:   Malignant neoplasm of prostate metastatic to bone [facility])   Euthyroid sick syndrome    Left-sided weakness   Foley catheter in place on admission   Pleural effusion, bilateral   Normocytic anemia   Elevated d-dimer   Nutmeg liver   Palliative care by specialist   Goals of care, counseling/discussion   Encounter for hospice care discussion   Full code status   Pleural effusion   SOB (shortness of breath)   Status post thoracentesis   Atrial fibrillation with RVR Patient presenting with persistent A. fib with RVR.  Recent extensive hospitalization in [place] with difficult to control A. fib despite multiple AV nodal blocking agents including amiodarone, digoxin, failed chemical cardioversion.  On presentation, patient was once again in A. fib with RVR and started on a Cardizem drip; now which is titrated off. --Continue metoprolol succinate 100 mg p.o. BID, hold for SBP<90 or HR <60 --Digoxin 0.25 mg p.o. daily --metoprolol 5mg  IV x 1 this am followed by 525mL NS bolus --Continue to hold home Eliquis --Continue to monitor on telemetry  Acute hypoxic respiratory failure 2/2 to decompensated diastolic congestive heart failure with likely exudative bilateral pleural effusions likely malignant Patient presenting with progressive shortness of breath, slightly hypoxic on admission requiring 2 L nasal cannula.  Recent hospitalization requiring ventilatory support.  Chest x-ray notable for substantial bilateral pleural effusions, suspect likely malignant etiology.  CT angiogram chest negative for pulmonary embolism.  No signs of infectious etiology.  TTE 06/25/2020 with LVEF 50-55%, no LV wall motion abnormalities, LA mildly dilated, RA mildly dilated, large pleural effusion on left, moderate TR, mild MR, IVC dilated. Underwent IR ultrasound-guided right thoracentesis on 06/27/2020 with 1.6 L dark yellow fluid removed allergy with reactive mesothelial cells present and ultrasound-guided left thoracentesis on 06/28/2020 with  1.3 L bright yellow fluid removed. --Holding Eliquis --Right  Pleural fluid consistent with exudative effusion, culture shows no growth x 2 days --Left pleural fluid culture no growth x24 hours --Continue supplemental oxygen, maintain SPO2 greater than 92%; on 2 L nasal cannula, with SpO2 100% --repeat CXR in the am  Left-sided weakness Patient reports new left upper extremity weakness.  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  Case was discussed with neurology by previous hospitalist, unlikely dural based met versus meningioma as likely cause, and recommended further imaging with MRI C-spine.  Patient unable to tolerate MR C-spine today once again. --PT/OT recommends CIR --Pending CIR evaluation/insurance authorization; if unable will likely dc home with home health  Prostate cancer with bony metastasis Diagnosed with prostate cancer June 2020, but has yet to receive treatment.  Imaging studies during hospitalization notable for extensive osseous metastatic disease.  Patient desires to establish care with [facility]; and after discussion with patient's brother, he requests oncology evaluation inpatient as patient would benefit from more local care.  Patient initially hesitant for evaluation here by oncology, but now would like to proceed with consult. --Palliative care following for poor prognosis, plan to continue to follow outpatient --Medical oncology consulted for evaluation and treatment options  Hypothyroidism versus sick euthyroid syndrome Patient with mild elevation of TSH, started on Synthroid in [place]. --TSH 7.345 --Continue levothyroxine 25 mcg p.o. 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  Iron 46, TIBC 141 (low), ferritin 590 (high); consistent with anemia of chronic disease, likely secondary to  cancer/metastases. --Hemoglobin 9.0>7.4>7.2>8.3 --Iron 55, TIBC 192 (low), ferritin 1549 (high), folate 6.6, B12 633. --Transfused 1 unit PRBC 10/30 --Continue to monitor hemoglobin daily --Transfuse for hemoglobin less than 7.0  Elevated D-dimer D-dimer elevated 19.33.  CT angiogram chest negative for pulmonary embolism.  Records from [place] facility notes CT abdomen/pelvis revealing nutmeg liver concerning for portal vein thrombosis.  Ultrasound Doppler liver unremarkable.  Etiology of his elevated D-dimer likely secondary to his underlying malignancy.   DVT prophylaxis: SCDs Code Status: Full code Family Communication: No family present at bedside; updated patient's brother, Dr. Chrissie Noa via telephone this afternoon  Disposition Plan:  Status is: Inpatient  Remains inpatient appropriate because:Ongoing active pain requiring inpatient pain management, Ongoing diagnostic testing needed not appropriate for outpatient work up, Unsafe d/c plan, IV treatments appropriate due to intensity of illness or inability to take PO and Inpatient level of care appropriate due to severity of illness   Dispo: The patient is from: Home              Anticipated d/c is to: CIR vs home with [facility] and palliative to follow outpatient              Anticipated d/c date is: 2 days              Patient currently is not medically stable to d/c.    Consultants:   Palliative care  Interventional radiology  Medical oncology  Procedures:   None  Antimicrobials:   None   Subjective: Patient seen and examined bedside, lying in bed.  Continues with mild shortness of breath, improved following thoracentesis.  Also continues with significant fatigue today.  Discussed with patient need for initial oncology consult while he is here, he is more amenable to setting up  an appointment when he is closer for discharge.  No other complaints or concerns at this time. Denies fever/chills/night sweats, no  nausea/vomiting/diarrhea, no chest pain, no palpitations, no shortness of breath, no abdominal pain, no cough/congestion.  No acute events overnight per nursing staff.  Objective: Vitals:   06/29/20 2200 06/30/20 0505 06/30/20 0727 06/30/20 0728  BP:  (!) 120/91 118/90 120/89  Pulse: 99 (!) 124  (!) 124  Resp: 20 (!) 30 11 13   Temp: 98.3 F (36.8 C) 98.2 F (36.8 C)  98.3 F (36.8 C)  TempSrc: Oral Oral  Oral  SpO2:  100% 100% 100%  Weight:  69.2 kg    Height:        Intake/Output Summary (Last 24 hours) at 06/30/2020 1225 Last data filed at 06/29/2020 2240 Gross per 24 hour  Intake --  Output 751 ml  Net -751 ml   Filed Weights   06/27/20 0500 06/29/20 0524 06/30/20 0505  Weight: 70.5 kg 68.4 kg 69.2 kg    Examination:  General exam: Appears calm and comfortable, thin/cachectic in appearance Respiratory system: Breath sounds slightly decreased bilateral bases, no wheezing, no rhonchi, normal respiratory effort, on 2 L nasal cannula with SPO2 100% Cardiovascular system: S1 & S2 heard, irregularly irregular rhythm, tachycardic. No JVD, murmurs, rubs, gallops or clicks.  Trace to 1+ pitting edema bilaterally Gastrointestinal system: Abdomen is nondistended, soft and nontender. No organomegaly or masses felt. Normal bowel sounds heard. Central nervous system: Alert and oriented. No focal neurological deficits. Extremities: Symmetric 5 x 5 power. Skin: No rashes, lesions or ulcers Psychiatry: Judgement and insight appear normal. Mood & affect appropriate.     Data Reviewed: I have personally reviewed following labs and imaging studies  CBC: Recent Labs  Lab 06/24/20 1852 06/28/20 0202 06/29/20 0408 06/30/20 0448  WBC 8.4 7.4 8.3 7.5  HGB 9.0* 7.4* 7.2* 8.3*  HCT 31.4* 25.0* 24.7* 27.4*  MCV 94.0 91.9 93.9 92.6  PLT 232 245 253 110   Basic Metabolic Panel: Recent Labs  Lab 06/24/20 1852 06/25/20 1546 06/28/20 0202 06/29/20 0408 06/30/20 0448  NA 140  --   139 139 141  K 3.9  --  4.0 4.1 4.0  CL 107  --  108 109 112*  CO2 25  --  23 21* 22  GLUCOSE 153*  --  108* 87 94  BUN 9  --  14 15 13   CREATININE 0.61  --  0.68 0.62 0.53*  CALCIUM 7.5*  --  7.3* 7.3* 7.4*  MG  --  2.0 1.9 1.9 1.9   GFR: Estimated Creatinine Clearance: 91.3 mL/min (A) (by C-G formula based on SCr of 0.53 mg/dL (L)). Liver Function Tests: Recent Labs  Lab 06/24/20 1852 06/27/20 1520  AST 49*  --   ALT 29  --   ALKPHOS 2,755*  --   BILITOT 0.7  --   PROT 5.6* 5.3*  ALBUMIN 1.9*  --    No results for input(s): LIPASE, AMYLASE in the last 168 hours. No results for input(s): AMMONIA in the last 168 hours. Coagulation Profile: No results for input(s): INR, PROTIME in the last 168 hours. Cardiac Enzymes: No results for input(s): CKTOTAL, CKMB, CKMBINDEX, TROPONINI in the last 168 hours. BNP (last 3 results) No results for input(s): PROBNP in the last 8760 hours. HbA1C: No results for input(s): HGBA1C in the last 72 hours. CBG: No results for input(s): GLUCAP in the last 168 hours. Lipid Profile: No results for input(s): CHOL,  HDL, LDLCALC, TRIG, CHOLHDL, LDLDIRECT in the last 72 hours. Thyroid Function Tests: No results for input(s): TSH, T4TOTAL, FREET4, T3FREE, THYROIDAB in the last 72 hours. Anemia Panel: Recent Labs    06/29/20 1021  VITAMINB12 633  FOLATE 6.6  FERRITIN 1,549*  TIBC 192*  IRON 55   Sepsis Labs: Recent Labs  Lab 06/25/20 1933 06/26/20 0735 06/26/20 1038 06/27/20 0202  PROCALCITON 0.43  --   --   --   LATICACIDVEN  --  1.7 2.6* 1.2    Recent Results (from the past 240 hour(s))  Urine culture     Status: None   Collection Time: 06/24/20  9:57 PM   Specimen: Urine, Catheterized  Result Value Ref Range Status   Specimen Description URINE, CATHETERIZED  Final   Special Requests NONE  Final   Culture   Final    NO GROWTH Performed at [facility], [place] [address] [place], [place] [address]    Report Status  06/26/2020 FINAL  Final  Respiratory Panel by RT PCR (Flu A&B, Covid) - Nasopharyngeal Swab     Status: None   Collection Time: 06/25/20  4:00 PM   Specimen: Nasopharyngeal Swab  Result Value Ref Range Status   SARS Coronavirus 2 by RT PCR NEGATIVE NEGATIVE Final    Comment: (NOTE) SARS-CoV-2 target nucleic acids are NOT DETECTED.  The SARS-CoV-2 RNA is generally detectable in upper respiratoy specimens during the acute phase of infection. The lowest concentration of SARS-CoV-2 viral copies this assay can detect is 131 copies/mL. A negative result does not preclude SARS-Cov-2 infection and should not be used as the sole basis for treatment or other patient management decisions. A negative result may occur with  improper specimen collection/handling, submission of specimen other than nasopharyngeal swab, presence of viral mutation(s) within the areas targeted by this assay, and inadequate number of viral copies (<131 copies/mL). A negative result must be combined with clinical observations, patient history, and epidemiological information. The expected result is Negative.  Fact Sheet for Patients:  PinkCheek.be  Fact Sheet for Healthcare Providers:  GravelBags.it  This test is no t yet approved or cleared by the [place] FDA and  has been authorized for detection and/or diagnosis of SARS-CoV-2 by FDA under an Emergency Use Authorization (EUA). This EUA will remain  in effect (meaning this test can be used) for the duration of the COVID-19 declaration under Section 564(b)(1) of the Act, 21 U.S.C. section 360bbb-3(b)(1), unless the authorization is terminated or revoked sooner.     Influenza A by PCR NEGATIVE NEGATIVE Final   Influenza B by PCR NEGATIVE NEGATIVE Final    Comment: (NOTE) The Xpert Xpress SARS-CoV-2/FLU/RSV assay is intended as an aid in  the diagnosis of influenza from Nasopharyngeal swab specimens and   should not be used as a sole basis for treatment. Nasal washings and  aspirates are unacceptable for Xpert Xpress SARS-CoV-2/FLU/RSV  testing.  Fact Sheet for Patients: PinkCheek.be  Fact Sheet for Healthcare Providers: GravelBags.it  This test is not yet approved or cleared by the [place] FDA and  has been authorized for detection and/or diagnosis of SARS-CoV-2 by  FDA under an Emergency Use Authorization (EUA). This EUA will remain  in effect (meaning this test can be used) for the duration of the  Covid-19 declaration under Section 564(b)(1) of the Act, 21  U.S.C. section 360bbb-3(b)(1), unless the authorization is  terminated or revoked. Performed at [facility], [place],  [place] [address]   SARS CORONAVIRUS 2 (TAT 6-24 HRS) Nasopharyngeal Nasopharyngeal Swab     Status: None   Collection Time: 06/26/20 11:40 AM   Specimen: Nasopharyngeal Swab  Result Value Ref Range Status   SARS Coronavirus 2 NEGATIVE NEGATIVE Final    Comment: (NOTE) SARS-CoV-2 target nucleic acids are NOT DETECTED.  The SARS-CoV-2 RNA is generally detectable in upper and lower respiratory specimens during the acute phase of infection. Negative results do not preclude SARS-CoV-2 infection, do not rule out co-infections with other pathogens, and should not be used as the sole basis for treatment or other patient management decisions. Negative results must be combined with clinical observations, patient history, and epidemiological information. The expected result is Negative.  Fact Sheet for Patients: SugarRoll.be  Fact Sheet for Healthcare Providers: https://www.woods-mathews.com/  This test is not yet approved or cleared by the [place] FDA and  has been authorized for detection and/or diagnosis of SARS-CoV-2 by FDA under an Emergency Use Authorization (EUA). This  EUA will remain  in effect (meaning this test can be used) for the duration of the COVID-19 declaration under Se ction 564(b)(1) of the Act, 21 U.S.C. section 360bbb-3(b)(1), unless the authorization is terminated or revoked sooner.  Performed at [facility], [place] [address] [place], [place] [address]   Gram stain     Status: None   Collection Time: 06/27/20 11:40 AM   Specimen: PATH Cytology Pleural fluid  Result Value Ref Range Status   Specimen Description PLEURAL FLUID  Final   Special Requests NONE  Final   Gram Stain   Final    WBC PRESENT,BOTH PMN AND MONONUCLEAR NO ORGANISMS SEEN CYTOSPIN SMEAR Performed at [facility], [place] [address], [place], [place] [address]    Report Status 06/27/2020 FINAL  Final  Culture, body fluid-bottle     Status: None (Preliminary result)   Collection Time: 06/27/20 11:40 AM   Specimen: Pleura  Result Value Ref Range Status   Specimen Description PLEURAL FLUID  Final   Special Requests NONE  Final   Culture   Final    NO GROWTH 3 DAYS Performed at [place] [address], [place], [place] [address]    Report Status PENDING  Incomplete  Culture, body fluid-bottle     Status: None (Preliminary result)   Collection Time: 06/28/20 10:53 AM   Specimen: Pleura  Result Value Ref Range Status   Specimen Description PLEURAL FLUID  Final   Special Requests LEFT LUNG  Final   Culture   Final    NO GROWTH 2 DAYS Performed at [place] [address], [place], [place] [address]    Report Status PENDING  Incomplete  Gram stain     Status: None   Collection Time: 06/28/20 10:53 AM   Specimen: Pleura  Result Value Ref Range Status   Specimen Description PLEURAL FLUID  Final   Special Requests LEFT LUNG  Final   Gram Stain   Final    WBC PRESENT,BOTH PMN AND MONONUCLEAR NO ORGANISMS SEEN CYTOSPIN SMEAR Performed at [facility], [address], [place], [place] [address]    Report Status 06/28/2020  FINAL  Final         Radiology Studies: No results found.      Scheduled Meds: . Chlorhexidine Gluconate Cloth  6 each Topical Daily  . digoxin  0.25 mg Oral Daily  . levothyroxine  25 mcg Oral QAC breakfast  .  metoprolol succinate  100 mg Oral BID   Continuous Infusions:   LOS: 5 days    Time spent: 36 minutes spent on chart review, discussion with nursing staff, consultants, updating family and interview/physical exam; more than 50% of that time was spent in counseling and/or coordination of care.    Purl Claytor J [place] DO [facility] Available via Epic secure chat 7am-7pm After these hours, please refer to coverage provider listed on [email or web address] 06/30/2020, 12:25 PM

## 2020-06-30 NOTE — Progress Notes (Signed)
Daily Progress Note   Patient Name: Chris Morales       Date: 06/30/2020 DOB: 07/13/1956  Age: 64 y.o. MRN#: [id number] Attending Physician: [place] Eric J, DO Primary Care Physician: Patient, No Pcp Per Admit Date: 06/24/2020  Reason for Consultation/Follow-up: Establishing goals of care  Subjective: Chart review performed. Received report from primary RN - no acute concerns.  Went to visit patient at bedside - no family/visitors present. Patient was sitting up in the chair awake, alert, oriented, and able to participate in conversation. He denied pain or shortness of breath. He is still on 2L [place] - patient states this is for comfort. No signs or non-verbal gestures of pain or discomfort noted. No respiratory distress, increased work of breathing, or secretions noted.   Patient states that he is overall feeling well; however, feels debilitated from prolonged hospitalizations between hospital stay in [place] and this admission. Therapeutic listening was provided as he reflected on what happened during his hospital admission in [place]. He is motivated and willing to participate in rehab/PT on discharge to regain his strength - he is hopeful to be able to return to work in the future. 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 Mr. Mckinnon and I reviewed the information provided to him from his visit with Hem/Onc today. Mr. Roesler is hopeful for control of his cancer with medications and is not interested in surgical intervention. He expressed understanding that his cancer treatment is not meant for cure, but rather for control. He expressed happiness over the information he was given today with hope that treatment can provide an overall good quality of  life for him for future years to come. Mr. Hinners stated that he no longer feels the need to be seen at [facility] and will follow up with providers in [place].   Discussed the patient's thoracentesis procedures - he stated they did not provide a drastic difference in his breathing. We discussed the possibility of his shortness of breath being from a.fib/heart arrhythmias - patient stated he is open to medication adjustments/recommendations for this and he is hopeful this symptom will get better over time with medication management.   I asked if patient would like for me to call his brother/Bruce and provide updates - patient declined  and stated that he wanted to call himself to discuss information, which is understandable.  All questions and concerns addressed. Encouraged to call with questions and/or concerns. PMT card provided.   Length of Stay: 5  Current Medications: Scheduled Meds:  . bicalutamide  50 mg Oral Daily  . Chlorhexidine Gluconate Cloth  6 each Topical Daily  . digoxin  0.25 mg Oral Daily  . [START ON 07/01/2020] leuprolide  7.5 mg Intramuscular Once  . levothyroxine  25 mcg Oral QAC breakfast  . metoprolol succinate  100 mg Oral BID    Continuous Infusions:   PRN Meds: acetaminophen, lidocaine (PF), lidocaine, ondansetron (ZOFRAN) IV, polyethylene glycol  Physical Exam Vitals and nursing note reviewed.  Constitutional:      General: He is not in acute distress. Pulmonary:     Effort: No respiratory distress.  Skin:    General: Skin is warm and dry.  Neurological:     Mental Status: He is alert and oriented to person, place, and time.     Motor: Weakness present.  Psychiatric:        Attention and Perception: Attention normal.        Behavior: Behavior is cooperative.        Cognition and Memory: Cognition and memory normal.             Vital Signs: BP 117/86 (BP Location: Right Arm)   Pulse 84   Temp 98 F (36.7 C) (Oral)   Resp 20   Ht 6\' 1"  (1.854 m)    Wt 69.2 kg   SpO2 96%   BMI 20.13 kg/m  SpO2: SpO2: 96 % O2 Device: O2 Device: Nasal Cannula O2 Flow Rate: O2 Flow Rate (L/min): 2 L/min  Intake/output summary:   Intake/Output Summary (Last 24 hours) at 06/30/2020 1712 Last data filed at 06/29/2020 2240 Gross per 24 hour  Intake --  Output 750 ml  Net -750 ml   LBM: Last BM Date: 06/29/20 Baseline Weight: Weight: 68 kg Most recent weight: Weight: 69.2 kg       Palliative Assessment/Data: PPS 70%    Flowsheet Rows     Most Recent Value  Intake Tab  Referral Department Hospitalist  Unit at Time of Referral Med/Surg Unit  Palliative Care Primary Diagnosis Cancer  Date Notified 06/25/20  Palliative Care Type New Palliative care  Reason for referral Clarify Goals of Care  Date of Admission 06/24/20  Date first seen by Palliative Care 06/27/20  # of days Palliative referral response time 2 Day(s)  # of days IP prior to Palliative referral 1  Clinical Assessment  Psychosocial & Spiritual Assessment  Social Work Plan of Care Referral to community resources  Palliative Care Outcomes  Patient/Family meeting held? Yes  Who was at the meeting? patient  Palliative Care Outcomes Clarified goals of care, Counseled regarding hospice, Provided advance care planning, Provided psychosocial or spiritual support      Patient Active Problem List   Diagnosis Date Noted  . Pleural effusion   . SOB (shortness of breath)   . Status post thoracentesis   . Palliative care by specialist   . Goals of care, counseling/discussion   . Encounter for hospice care discussion   . Full code status   . Atrial fibrillation with rapid ventricular response ([place]) 06/25/2020  . 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 Left-sided weakness 06/25/2020  . Foley catheter in place on admission 06/25/2020  . Pleural effusion, bilateral 06/25/2020  . Normocytic anemia 06/25/2020  .  Elevated d-dimer 06/25/2020  . 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 Atrial  fibrillation, chronic ([place]) 06/25/2020  . Malignant neoplasm of prostate metastatic to bone ([place]) 02/22/2019  . Metastasis to spinal column ([place]) 02/22/2019  . AKI (acute kidney injury) ([place]) 02/20/2019  . Prostate CA ([place]) 02/20/2019  . Cervical myelopathy ([place]) 02/19/2019    Palliative Care Assessment & Plan   Patient Profile: 64 y.o.malewith past medical history of prostate cancer with extensive bony metastases, atrial fibrillation, hypothyroidismadmitted on 10/25/2021with atrial fibrillation with RVR and bilateral pulmoary effusions.  Patient recently hospitalized mid [facility] in both [place] from 1017-06/24/2020. During the hospitalization, patient was found to be in atrial fibrillation with RVR and respiratory failure requiring admission to the intensive care unit and ventilatory support.Patient was eventually extubatedand subsequently wassuffering from left-sided upper extremity weakness and decided to leave AMA on 06/24/2020 and he took a flight from [place] to [place] where his brother lives. Immediately after landing, patient proceeded to the [name] ED for evaluation.  Assessment: Atrial fibrillation with RVR Malignant neoplasm of prostate metastatic to bone Left-sided weakness Bilateral pleural effusion Elevated d-dimer Weakness Shortness of breath Status post thoracentesis  Recommendations/Plan: Continue current full scope medical treatment Continue full code status Patient is hopeful for control of his cancer with medications and is not interested in surgical intervention at this time Patient is open to rehab/PT on discharge  Patient no longer feels the need to be seen at [facility], he will follow up with providers in [facility] Patient is open to all recommended tests/procedures It is important the patient be set up with outpatient Palliative Care before discharge - [facility]/20 PMT will continue to follow  peripherally. If there are any imminent needs please call the service directly   Goals of Care and Additional Recommendations: Limitations on Scope of Treatment: Full Scope Treatment  Code Status:    Code Status Orders  (From admission, onward)         Start     Ordered   06/25/20 0203  Full code  Continuous        06/25/20 0202        Code Status History    Date Active Date Inactive Code Status Order ID Comments User Context   02/19/2019 1327 02/23/2019 1718 Full Code [id number]  Earnie Larsson, MD ED   Advance Care Planning Activity      Prognosis:  Unable to determine  Discharge Planning: To Be Determined  Care plan was discussed with primary RN, patient  Thank you for allowing the Palliative Medicine Team to assist in the care of this patient.   Total Time 35 minutes Prolonged Time Billed  no       Greater than 50%  of this time was spent counseling and coordinating care related to the above assessment and plan.  Lin Landsman, NP  Please contact Palliative Medicine Team phone at [phone] for questions and concerns.

## 2020-06-30 NOTE — Progress Notes (Signed)
[place]  [phone]) [phone] Fax:(336) [phone]     ID: Chris Morales DOB: 02-14-1956  MR#: [id number]  UMP#:[id number]  Patient Care Team: Patient, No Pcp Per as PCP - General (General Practice) Chauncey Cruel, MD OTHER MD:  CHIEF COMPLAINT: stage IV prostate cancer  CURRENT TREATMENT: to start combined androgen deprivation   HISTORY OF CURRENT ILLNESS: Chris Morales tells me he developed (among other symptoms) neck pain and this was evaluated here in the Spring of 2020. He underwent C4-5-6 laminectomy under [facility] and the pathology [phone]) confirmed prostate cancer, with the PSA at that time 270.00. Orchiectomy was offered but the patient demurred and has had no prostate cancer specific treatment to date.  More recently he has been found to be in A Fib with RR and after admission in [place] flew to [place] as his brother Chris Vivona MD) lives here and can facilitate his medical evaluation and care.  The patient's subsequent history is as detailed below.  INTERVAL HISTORY: I met with the patinet in his hospital room 06/30/2020; no fmily present  REVIEW OF SYSTEMS: He is feeling "OK," does not think the thoracentesis helped his breathing any; denies pain; no headaches, visual changes, nausea, vomiting; has weakness left UE and left LE  PAST MEDICAL HISTORY: Past Medical History:  Diagnosis Date  . Prostate CA [facility])   Atrial fibrillation  PAST SURGICAL HISTORY: Past Surgical History:  Procedure Laterality Date  . IR THORACENTESIS ASP PLEURAL SPACE W/IMG GUIDE  06/27/2020  . IR THORACENTESIS ASP PLEURAL SPACE W/IMG GUIDE  06/28/2020  . LAMINECTOMY Bilateral 02/20/2019   Procedure: CERVICAL LAMINECTOMY FOR TUMOR;  Surgeon: Earnie Larsson, MD;  Location: [place];  Service: Neurosurgery;  Laterality: Bilateral;  CERVICAL LAMINECTOMY FOR TUMOR  . LIGAMENT REPAIR Left     FAMILY HISTORY Family History  Problem Relation Age of Onset  . Prostate  cancer Brother   Father died from heart probems age 58; mother died with breast cancer age 2; one brother with a history of prostate cancer, one sister  SOCIAL HISTORY:  Divorced, no children. Has run "more than 200 marathons." Lives by himself, no pets. Is a [place], also attends [employer] services sometimes.    ADVANCED DIRECTIVES: not inplace; intends to name his brother as HCPOA   HEALTH MAINTENANCE: Social History   Tobacco Use  . Smoking status: Never Smoker  . Smokeless tobacco: Never Used  Vaping Use  . Vaping Use: Never used  Substance Use Topics  . Alcohol use: Yes  . Drug use: No       No Known Allergies  Current Facility-Administered Medications  Medication Dose Route Frequency Provider Last Rate Last Admin  . acetaminophen (TYLENOL) tablet 650 mg  650 mg Oral Q4H PRN Shalhoub, Sherryll Burger, MD      . bicalutamide (CASODEX) tablet 50 mg  50 mg Oral Daily Taneshia Lorence, Virgie Dad, MD      . Chlorhexidine Gluconate Cloth 2 % PADS 6 each  6 each Topical Daily Annita Brod, MD   6 each at 06/30/20 [phone]  . digoxin (LANOXIN) tablet 0.25 mg  0.25 mg Oral Daily Annita Brod, MD   0.25 mg at 06/30/20 0845  . [START ON 07/01/2020] leuprolide (LUPRON) injection 7.5 mg  7.5 mg Intramuscular Once Erlean Mealor, Virgie Dad, MD      . levothyroxine (SYNTHROID) tablet 25 mcg  25 mcg Oral QAC breakfast Annita Brod, MD   25 mcg at 06/30/20 0504  .  lidocaine (PF) (XYLOCAINE) 1 % injection    PRN Soyla Dryer R, NP   15 mL at 06/27/20 1112  . lidocaine (XYLOCAINE) 1 % (with pres) injection   Infiltration PRN Soyla Dryer R, NP   10 mL at 06/28/20 1032  . metoprolol succinate (TOPROL-XL) 24 hr tablet 100 mg  100 mg Oral BID [place] Eric J, DO   100 mg at 06/30/20 0845  . ondansetron (ZOFRAN) injection 4 mg  4 mg Intravenous Q6H PRN Shalhoub, Sherryll Burger, MD      . polyethylene glycol (MIRALAX / GLYCOLAX) packet 17 g  17 g Oral Daily PRN Shalhoub, Sherryll Burger, MD         OBJECTIVE: African Amican man examined in bed  Vitals:   06/30/20 0727 06/30/20 0728  BP: 118/90 120/89  Pulse:  (!) 124  Resp: 11 13  Temp:  98.3 F (36.8 C)  SpO2: 100% 100%     Body mass index is 20.13 kg/m.   Wt Readings from Last 3 Encounters:  06/30/20 152 lb 8.9 oz (69.2 kg)  02/19/19 163 lb (73.9 kg)    Lungs no rales or rhonchi, auscultated anterolaterally Heart premature beats Abd soft, nontender, positive bowel sounds Neuro: 4-/5 left UE, 4/5 left LE, well-oriented, appropriate affect   LAB RESULTS:  CMP     Component Value Date/Time   NA 141 06/30/2020 0448   K 4.0 06/30/2020 0448   CL 112 (H) 06/30/2020 0448   CO2 22 06/30/2020 0448   GLUCOSE 94 06/30/2020 0448   BUN 13 06/30/2020 0448   CREATININE 0.53 (L) 06/30/2020 0448   CALCIUM 7.4 (L) 06/30/2020 0448   PROT 5.3 (L) 06/27/2020 1520   ALBUMIN 1.9 (L) 06/24/2020 1852   AST 49 (H) 06/24/2020 1852   ALT 29 06/24/2020 1852   ALKPHOS 2,755 (H) 06/24/2020 1852   BILITOT 0.7 06/24/2020 1852   GFRNONAA >60 06/30/2020 0448   GFRAA >60 02/23/2019 0434    Lab Results  Component Value Date   TOTALPROTELP 6.3 02/19/2019   ALBUMINELP 3.5 02/19/2019   A1GS 0.2 02/19/2019   A2GS 0.8 02/19/2019   BETS 0.9 02/19/2019   GAMS 0.9 02/19/2019   MSPIKE Not Observed 02/19/2019   SPEI Comment 02/19/2019    No results found for: KPAFRELGTCHN, LAMBDASER, KAPLAMBRATIO  Lab Results  Component Value Date   WBC 7.5 06/30/2020   NEUTROABS 3.4 02/19/2019   HGB 8.3 (L) 06/30/2020   HCT 27.4 (L) 06/30/2020   MCV 92.6 06/30/2020   PLT 275 06/30/2020     Results for HAWKINS, SEAMAN (MRN [id number]) as of 06/30/2020 12:49  Ref. Range 02/19/2019 17:44 06/30/2020 04:48  Prostatic Specific Antigen Latest Ref Range: 0.00 - 4.00 ng/mL 270.00 (H) 86.26 (H)   '@LASTCHEMISTRY'$ @  No results found for: LABCA2  No components found for: HGDJME268  No results for input(s): INR in the last 168 hours.  No results found  for: LABCA2  No results found for: TMH962  No results found for: IWL798  No results found for: XQJ194  No results found for: CA2729  No components found for: HGQUANT  No results found for: CEA1 / No results found for: CEA1   No results found for: AFPTUMOR  No results found for: CHROMOGRNA  No results found for: PSA1  No results displayed because visit has over 200 results.      (this displays the last labs from the last 3 days)  Lab Results  Component Value Date   TOTALPROTELP  6.3 02/19/2019   ALBUMINELP 3.5 02/19/2019   A1GS 0.2 02/19/2019   A2GS 0.8 02/19/2019   BETS 0.9 02/19/2019   GAMS 0.9 02/19/2019   MSPIKE Not Observed 02/19/2019   SPEI Comment 02/19/2019   (this displays SPEP labs)  No results found for: KPAFRELGTCHN, LAMBDASER, KAPLAMBRATIO (kappa/lambda light chains)  No results found for: HGBA, HGBA2QUANT, HGBFQUANT, HGBSQUAN (Hemoglobinopathy evaluation)   Lab Results  Component Value Date   LDH 270 (H) 06/27/2020    Lab Results  Component Value Date   IRON 55 06/29/2020   TIBC 192 (L) 06/29/2020   IRONPCTSAT 29 06/29/2020   (Iron and TIBC)  Lab Results  Component Value Date   FERRITIN 1,549 (H) 06/29/2020    Urinalysis    Component Value Date/Time   COLORURINE AMBER (A) 06/24/2020 2058   APPEARANCEUR HAZY (A) 06/24/2020 2058   LABSPEC 1.026 06/24/2020 2058   PHURINE 5.0 06/24/2020 2058   GLUCOSEU NEGATIVE 06/24/2020 2058   HGBUR SMALL (A) 06/24/2020 2058   [place] NEGATIVE 06/24/2020 2058   [place] NEGATIVE 06/24/2020 2058   PROTEINUR 30 (A) 06/24/2020 2058   NITRITE NEGATIVE 06/24/2020 2058   LEUKOCYTESUR NEGATIVE 06/24/2020 2058     STUDIES: DG Chest 1 View  Result Date: 06/28/2020 CLINICAL DATA:  Left thoracentesis. EXAM: CHEST  1 VIEW COMPARISON:  06/28/2020. FINDINGS: Mediastinum and hilar structures normal. Stable cardiomegaly. Bilateral interstitial prominence again noted with interim improvement from prior  exam. Findings suggests improving CHF. Improved left pleural effusion. Tiny right pleural effusion. No pneumothorax post thoracentesis. IMPRESSION: Findings consistent with improving CHF. Improved left pleural effusion. No pneumothorax post thoracentesis. Tiny right pleural effusion. Electronically Signed   By: Marcello Moores  Register   On: 06/28/2020 10:55   DG Chest 1 View  Result Date: 06/27/2020 CLINICAL DATA:  Post right thoracentesis. EXAM: CHEST  1 VIEW COMPARISON:  06/24/2020 FINDINGS: Interval improvement in right pleural effusion. No pneumothorax. Improved aeration right lung base Interval progression of left pleural effusion and left lower lobe atelectasis Extensive sclerotic bony changes compatible with metastatic prostate cancer. IMPRESSION: Improved right pleural effusion with no pneumothorax post thoracentesis Progression of left effusion and left lower lobe atelectasis. Bony metastatic disease. Electronically Signed   By: Franchot Gallo M.D.   On: 06/27/2020 12:06   CT ANGIO CHEST PE W OR WO CONTRAST  Result Date: 06/25/2020 CLINICAL DATA:  Worsening shortness of breath EXAM: CT ANGIOGRAPHY CHEST WITH CONTRAST TECHNIQUE: Multidetector CT imaging of the chest was performed using the standard protocol during bolus administration of intravenous contrast. Multiplanar CT image reconstructions and MIPs were obtained to evaluate the vascular anatomy. CONTRAST:  140mL OMNIPAQUE IOHEXOL 350 MG/ML SOLN COMPARISON:  Radiograph same day FINDINGS: Cardiovascular: There is a optimal opacification of the pulmonary arteries. There is no central,segmental, or subsegmental filling defects within the pulmonary arteries. There is mild cardiomegaly present. No pericardial effusion or thickening. No evidence right heart strain. There is normal three-vessel brachiocephalic anatomy without proximal stenosis. The thoracic aorta is normal in appearance. Mediastinum/Nodes: No hilar, mediastinal, or axillary adenopathy.  Thyroid gland, trachea, and esophagus demonstrate no significant findings. Lungs/Pleura: There is moderate to large bilateral pleural effusion with adjacent compressive atelectasis seen. Patchy rounded ground-glass opacities are seen predominantly within the right upper lung and right middle lobe. Upper Abdomen: No acute abnormalities present in the visualized portions of the upper abdomen. There is diffuse anasarca present. Musculoskeletal: Extensive osseous sclerotic blastic lesions are seen throughout the visualized portion of the axial and appendicular skeleton.  Review of the MIP images confirms the above findings. IMPRESSION: No central, segmental, or subsegmental pulmonary embolism Moderate to large bilateral pleural effusions with adjacent compressive atelectasis. Ground-glass opacities within the right upper lung and right middle lobe, likely due to infectious or inflammatory etiology. Extensive diffuse osseous metastatic disease. Electronically Signed   By: Prudencio Pair M.D.   On: 06/25/2020 03:11   MR BRAIN W WO CONTRAST  Result Date: 06/25/2020 CLINICAL DATA:  Left-sided weakness, metastatic prostate cancer EXAM: MRI HEAD WITHOUT AND WITH CONTRAST TECHNIQUE: Multiplanar, multiecho pulse sequences of the brain and surrounding structures were obtained without and with intravenous contrast. CONTRAST:  57mL GADAVIST GADOBUTROL 1 MMOL/ML IV SOLN COMPARISON:  2020 FINDINGS: Brain: There is thickening and enhancement along the posterior falx measuring 5 mm in thickness. The adjacent superior sagittal sinus is uninvolved. There is new minimal adjacent T2 FLAIR hyperintensity in the right parietal lobe that may reflect associated edema. There is no acute infarction or intracranial hemorrhage. There is no parenchymal mass or mass effect. There is no hydrocephalus or extra-axial fluid collection. Prominence of the ventricles and sulci reflects minor generalized parenchymal volume loss. Patchy foci of T2  hyperintensity in the supratentorial white matter are nonspecific but probably reflect mild chronic microvascular ischemic changes. These findings have progressed since 2020. Vascular: Major vessel flow voids at the skull base are preserved. Skull and upper cervical spine: Abnormal marrow signal likely reflecting diffuse metastatic disease. Sinuses/Orbits: Chronic right maxillary sinusitis. Orbits are unremarkable. Other: Sella is unremarkable.  Bilateral mastoid effusions. IMPRESSION: No acute infarction. Thickening enhancement along the posterior falx cerebri with possible minimal associated parenchymal edema. Differential considerations include dural-based metastasis (more likely given normal appearance on prior study) and meningioma. Diffuse osseous metastatic disease. Electronically Signed   By: Macy Mis M.D.   On: 06/25/2020 08:24   DG CHEST PORT 1 VIEW  Result Date: 06/28/2020 CLINICAL DATA:  Shortness of breath. EXAM: PORTABLE CHEST 1 VIEW COMPARISON:  06/27/2020. FINDINGS: Mediastinum and heart size normal. Progression of diffuse left lung interstitial infiltrates/edema. Diffuse right lung interstitial infiltrates/edema noted on today's exam. Persistent left-sided pleural effusion without interim change. No pneumothorax. Extensive sclerotic bony metastatic disease again noted. IMPRESSION: 1. Progression of diffuse left lung interstitial infiltrates/edema. Diffuse right lung interstitial infiltrates/edema noted on today's exam. Persistent left-sided pleural effusion without interim change. 2. Extensive sclerotic bony metastatic disease again noted. Electronically Signed   By: [place]   On: 06/28/2020 05:52   DG Chest Portable 1 View  Result Date: 06/24/2020 CLINICAL DATA:  Tachycardia. EXAM: PORTABLE CHEST 1 VIEW COMPARISON:  February 19, 2019 FINDINGS: There is cardiomegaly. There are moderate to large bilateral pleural effusions. There is bibasilar atelectasis. There is diffuse  sclerosis throughout the visualized osseous structures most notably the thoracic spine. There is no pneumothorax. IMPRESSION: 1. Moderate to large bilateral pleural effusions with adjacent atelectasis. 2. Cardiomegaly. 3. Progressive sclerosis throughout the visualized osseous structures consistent with metastatic disease. Electronically Signed   By: Constance Holster M.D.   On: 06/24/2020 21:33   US LIVER DOPPLER  Result Date: 06/25/2020 CLINICAL DATA:  64 year old male with a history of liver disease EXAM: DUPLEX ULTRASOUND OF LIVER TECHNIQUE: Color and duplex Doppler ultrasound was performed to evaluate the hepatic in-flow and out-flow vessels. COMPARISON:  None. FINDINGS: Portal Vein Velocities Main:  43 cm/sec Right:  28 cm/sec Left:  18 cm/sec Hepatic Vein Velocities Right:  77 cm/sec Middle:  78 cm/sec Left:  92 cm/sec Hepatic Artery Velocity:  135 cm/sec Splenic Vein Velocity:  24 cm/sec Varices: Absent Ascites: Absent Spleen volume estimated 52 cubic cm Pleural fluid bilaterally IMPRESSION: Unremarkable duplex of the hepatic vasculature. Bilateral pleural effusions Electronically Signed   By: Gilmer Mor D.O.   On: 06/25/2020 14:47   ECHOCARDIOGRAM COMPLETE BUBBLE STUDY  Result Date: 06/25/2020    ECHOCARDIOGRAM REPORT   Patient Name:   ARAVIND CHRISMER Date of Exam: 06/25/2020 Medical Rec #:  [id number]    Height:       73.0 in Accession #:    [id number]   Weight:       150.0 lb Date of Birth:  07/07/1956    BSA:          1.904 m Patient Age:    64 years     BP:           111/75 mmHg Patient Gender: M            HR:           126 bpm. Exam Location:  Inpatient Procedure: 2D Echo, Cardiac Doppler, Color Doppler and Saline Contrast Bubble            Study Indications:    Atrial fibrillation with rapid ventricular response (HCC)  History:        Patient has no prior history of Echocardiogram examinations.  Sonographer:    Eulah Pont RDCS Referring Phys: [id number] Deno Lunger SHALHOUB IMPRESSIONS  1.  Left ventricular ejection fraction, by estimation, is 50 to 55%. The left ventricle has low normal function. The left ventricle has no regional wall motion abnormalities. Left ventricular diastolic parameters were normal. There is the interventricular septum is flattened in diastole ('D' shaped left ventricle), consistent with right ventricular volume overload.  2. Right ventricular systolic function is mildly reduced. The right ventricular size is mildly enlarged. There is mildly elevated pulmonary artery systolic pressure. The estimated right ventricular systolic pressure is 42.0 mmHg.  3. Left atrial size was mildly dilated.  4. Right atrial size was mildly dilated.  5. Large pleural effusion in the left lateral region.  6. The mitral valve is normal in structure. Mild mitral valve regurgitation.  7. Tricuspid valve regurgitation is mild to moderate.  8. The aortic valve is normal in structure. Aortic valve regurgitation is not visualized. Mild aortic valve sclerosis is present, with no evidence of aortic valve stenosis.  9. The inferior vena cava is dilated in size with <50% respiratory variability, suggesting right atrial pressure of 15 mmHg. 10. Evidence of atrial level shunting detected by color flow Doppler. Agitated saline contrast bubble study was positive with shunting observed within 3-6 cardiac cycles suggestive of interatrial shunt. Comparison(s): No prior Echocardiogram. Findings are suggestive of right heart volume overload due to left-to-right shunt and there is a small right to left interatrial shunt by saline contrast study. However, an atrial septal defect could not be outlined with 2D and color Doppler imaging. Consider TEE or CT angiography to evaluate for anormalous pulmonary vein return or occult ASD. Qp:Qs shunt fraction could not be accurately calculated due to poor quality RV outflow imaging. FINDINGS  Left Ventricle: Left ventricular ejection fraction, by estimation, is 50 to 55%. The  left ventricle has low normal function. The left ventricle has no regional wall motion abnormalities. The left ventricular internal cavity size was normal in size. There is no left ventricular hypertrophy. The interventricular septum is flattened in diastole ('D' shaped left ventricle), consistent with right ventricular volume overload. Left  ventricular diastolic parameters were normal. Normal left ventricular filling pressure. Right Ventricle: The right ventricular size is mildly enlarged. No increase in right ventricular wall thickness. Right ventricular systolic function is mildly reduced. There is mildly elevated pulmonary artery systolic pressure. The tricuspid regurgitant  velocity is 2.60 m/s, and with an assumed right atrial pressure of 15 mmHg, the estimated right ventricular systolic pressure is 16.1 mmHg. Left Atrium: Left atrial size was mildly dilated. Right Atrium: Right atrial size was mildly dilated. Pericardium: There is no evidence of pericardial effusion. Mitral Valve: The mitral valve is normal in structure. Mild mitral valve regurgitation. Tricuspid Valve: The tricuspid valve is grossly normal. Tricuspid valve regurgitation is mild to moderate. Aortic Valve: The aortic valve is normal in structure. Aortic valve regurgitation is not visualized. Mild aortic valve sclerosis is present, with no evidence of aortic valve stenosis. Pulmonic Valve: The pulmonic valve was not well visualized. Pulmonic valve regurgitation is not visualized. Aorta: The aortic root and ascending aorta are structurally normal, with no evidence of dilitation. Venous: The inferior vena cava is dilated in size with less than 50% respiratory variability, suggesting right atrial pressure of 15 mmHg. IAS/Shunts: There is redundancy of the interatrial septum. Evidence of atrial level shunting detected by color flow Doppler. Agitated saline contrast was given intravenously to evaluate for intracardiac shunting. Agitated saline  contrast bubble study was  positive with shunting observed within 3-6 cardiac cycles suggestive of interatrial shunt. Additional Comments: There is a large pleural effusion in the left lateral region.  LEFT VENTRICLE PLAX 2D LVIDd:         4.10 cm     Diastology LVIDs:         3.00 cm     LV e' medial:    13.40 cm/s LV PW:         1.00 cm     LV E/e' medial:  6.5 LV IVS:        0.90 cm     LV e' lateral:   9.00 cm/s LVOT diam:     2.10 cm     LV E/e' lateral: 9.7 LV SV:         68 LV SV Index:   36 LVOT Area:     3.46 cm  LV Volumes (MOD) LV vol d, MOD A2C: 63.8 ml LV vol d, MOD A4C: 58.6 ml LV vol s, MOD A2C: 32.5 ml LV vol s, MOD A4C: 27.6 ml LV SV MOD A2C:     31.3 ml LV SV MOD A4C:     58.6 ml LV SV MOD BP:      32.7 ml RIGHT VENTRICLE RV S prime:     6.85 cm/s TAPSE (M-mode): 1.7 cm LEFT ATRIUM             Index       RIGHT ATRIUM           Index LA diam:        4.00 cm 2.10 cm/m  RA Area:     21.40 cm LA Vol (A2C):   37.5 ml 19.70 ml/m RA Volume:   61.60 ml  32.35 ml/m LA Vol (A4C):   53.1 ml 27.89 ml/m LA Biplane Vol: 44.8 ml 23.53 ml/m  AORTIC VALVE             PULMONIC VALVE LVOT Vmax:   89.40 cm/s  RVOT Peak grad: 2 mmHg LVOT Vmean:  65.500 cm/s LVOT VTI:    0.197 m  AORTA Ao Root diam: 2.70 cm Ao Asc diam:  2.90 cm MITRAL VALVE               TRICUSPID VALVE MV Area (PHT): 3.77 cm    TR Peak grad:   27.0 mmHg MV Decel Time: 201 msec    TR Vmax:        260.00 cm/s MV E velocity: 87.50 cm/s MV A velocity: 90.80 cm/s  SHUNTS MV E/A ratio:  0.96        Systemic VTI:  0.20 m                            Systemic Diam: 2.10 cm                            Pulmonic VTI:  0.087 m Dani Gobble Croitoru MD Electronically signed by Sanda Klein MD Signature Date/Time: 06/25/2020/12:54:37 PM    Final    IR THORACENTESIS ASP PLEURAL SPACE W/IMG GUIDE  Result Date: 06/28/2020 INDICATION: Patient with a history of prostate cancer with extensive metastases now has new onset pleural effusions. Interventional radiology  asked to perform a therapeutic and diagnostic thoracentesis. EXAM: ULTRASOUND GUIDED THORACENTESIS MEDICATIONS: 1% lidocaine 10 mL COMPLICATIONS: None immediate. PROCEDURE: An ultrasound guided thoracentesis was thoroughly discussed with the patient and questions answered. The benefits, risks, alternatives and complications were also discussed. The patient understands and wishes to proceed with the procedure. Written consent was obtained. Ultrasound was performed to localize and mark an adequate pocket of fluid in the left chest. The area was then prepped and draped in the normal sterile fashion. 1% Lidocaine was used for local anesthesia. Under ultrasound guidance a 6 Fr Safe-T-Centesis catheter was introduced. Thoracentesis was performed. The catheter was removed and a dressing applied. FINDINGS: A total of approximately 1.3 L of bright yellow fluid was removed. Samples were sent to the laboratory as requested by the clinical team. IMPRESSION: Successful ultrasound guided left thoracentesis yielding 1.3 L of pleural fluid. Read by: Soyla Dryer, NP Electronically Signed   By: Corrie Mckusick D.O.   On: 06/28/2020 12:11   IR THORACENTESIS ASP PLEURAL SPACE W/IMG GUIDE  Result Date: 06/27/2020 INDICATION: Patient with a history of prostate cancer with extensive metastases now has new onset pleural effusions. Interventional radiology asked to perform a therapeutic and diagnostic thoracentesis. EXAM: ULTRASOUND GUIDED THORACENTESIS MEDICATIONS: 1% lidocaine 20 mL COMPLICATIONS: None immediate. PROCEDURE: An ultrasound guided thoracentesis was thoroughly discussed with the patient and questions answered. The benefits, risks, alternatives and complications were also discussed. The patient understands and wishes to proceed with the procedure. Written consent was obtained. Ultrasound was performed to localize and mark an adequate pocket of fluid in the right chest. The area was then prepped and draped in the normal  sterile fashion. 1% Lidocaine was used for local anesthesia. Under ultrasound guidance a 6 Fr Safe-T-Centesis catheter was introduced. Thoracentesis was performed. The catheter was removed and a dressing applied. FINDINGS: A total of approximately 1.6 L of dark yellow fluid was removed. Samples were sent to the laboratory as requested by the clinical team. IMPRESSION: Successful ultrasound guided right thoracentesis yielding 1.6 L of pleural fluid. Read by: Soyla Dryer, NP Electronically Signed   By: Markus Daft M.D.   On: 06/27/2020 12:29    ELIGIBLE FOR AVAILABLE RESEARCH PROTOCOL: no  ASSESSMENT: 64 y.o. [place] man s/p C4-6 laminectomy (Dr Trenton Gammon)  06/22/202 with pathology documenting metastatic prostate cancer  (a) CT abd/pelvis 02/19/2019 finds prostatic enlargement, regional adenopathy, multiple bone lesions and o.7 cm LLL lung nodule  (b) PSA 270.00 02/19/2019  (c) restaging 06/25/2020 chest CT/angio, shows bilateral effusions, extensive bony metastatic disease  (d) brain MRI 10/26/2021shows possible dural based metastases  (1) qualifies for genetics testing  (2) bicalutamide started 06/30/2020  (a) leuprolide started 07/01/2020  (3) referrals to neuro-oncology and GU-oncology pending PLAN: I spent approximately 60 minutes face to face with Chris Morales with more than 50% of that time spent in counseling and coordination of care. Specifically we reviewed the biology of the patient's diagnosis and the specifics of her situation.   He understands that stage IV .prostate cancer is not curable with our current knowledge base. The goal of treatment is control. In many cases metastatic prostate cancer can be controlled as if it were a chronic illness with generally good quality of life and our hope is that will be Chris Morales's case.   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 This can be accomplished with surgical  castration or medical castration, the latter using leuprolide and (initially) bicalutamide. We discussed the possible side effects, toxicities and complications and he is agreeable to proceeding with medical castration treatment.  We discussed also the fact that prostate cancer therapy in 2021 is complex, with many additional options, and it would be best for him to see a prostate cancer specialist and not just a general oncologist. Our prostate cancer specialist in [place] is Dr [name] and Elias is agreeable to a consult with him once he leaves the hospital.  I have gone ahead and written for the casodex and lupron. I will also arrange for outpatient follow-up at our clinic with Dr [name] and request a review of his brain MRI by Dr Mickeal Skinner in neuro-oncology.   We will follow with you while in the hospital.   Chauncey Cruel, MD   06/30/2020 12:48 PM Medical Oncology and Hematology [facility] [address] [place], [place] [address] Tel. [phone]    Fax. [phone]

## 2020-07-01 ENCOUNTER — Telehealth: Payer: Self-pay | Admitting: Oncology

## 2020-07-01 ENCOUNTER — Other Ambulatory Visit: Payer: Self-pay | Admitting: Oncology

## 2020-07-01 ENCOUNTER — Inpatient Hospital Stay (HOSPITAL_COMMUNITY): Payer: PRIVATE HEALTH INSURANCE

## 2020-07-01 DIAGNOSIS — C61 Malignant neoplasm of prostate: Secondary | ICD-10-CM

## 2020-07-01 DIAGNOSIS — C7951 Secondary malignant neoplasm of bone: Secondary | ICD-10-CM

## 2020-07-01 DIAGNOSIS — I4891 Unspecified atrial fibrillation: Secondary | ICD-10-CM | POA: Diagnosis not present

## 2020-07-01 LAB — GLUCOSE, CAPILLARY: Glucose-Capillary: 118 mg/dL — ABNORMAL HIGH (ref 70–99)

## 2020-07-01 LAB — CYTOLOGY - NON PAP

## 2020-07-01 LAB — TESTOSTERONE: Testosterone: 400 ng/dL (ref 264–916)

## 2020-07-01 MED ORDER — DILTIAZEM HCL 60 MG PO TABS
30.0000 mg | ORAL_TABLET | Freq: Three times a day (TID) | ORAL | Status: DC
  Administered 2020-07-01 – 2020-07-05 (×13): 30 mg via ORAL
  Filled 2020-07-01 (×14): qty 1

## 2020-07-01 NOTE — Progress Notes (Signed)
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 Pt adm with afib with rvr, acute hypoxic respiratory failure likely secondary to malignant bil pleural effusions. Pt had recently been hospitalized in [place] with afib with rvr and resp failure requiring vent support and then left AMA before flying back to [facility] and presenting immediately to ED. 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 MRI - thickening and enhancement along the posterior falx. New hyperintensity in R parietal lobe that may reflect associated edema.  PMH - prostate CA with bony mets, C3-4 laminectomy with tumor removal 01/2019, afib    PT Comments    Pt was seen for mobility on RW with O2 at 1L initially, but due to sats being high at 100%, nursing discontinued to see how pt did.  Hit 88% very briefly then resumed satting in 90's on room air.  Pt is more comfortable per his estimation on O2, so nursing replaced his cannula.  Pt is quite tolerant of strenthening to BLE's and so completed hip and knee routine.  Follow for rehab pre-CIR placement, focusing on LE strength, balance and endurance with respect to how resp changes are impacting him.   Follow Up Recommendations  CIR     Equipment Recommendations  Rolling walker with 5" wheels    Recommendations for Other Services       Precautions / Restrictions Precautions Precautions: Fall Precaution Comments: monitor O2 sats Restrictions Weight Bearing Restrictions: No    Mobility  Bed Mobility Overal bed mobility: Needs Assistance       Supine to sit: Min guard;HOB elevated     General bed mobility comments: up in  chair  Transfers Overall transfer level: Needs assistance Equipment used: Rolling walker (2 wheeled);1 person hand held assist Transfers: Sit to/from Stand Sit to Stand: Min assist;Mod assist         General transfer comment: better with repetititve practice  Ambulation/Gait Ambulation/Gait assistance: Min guard Gait Distance (Feet): 130 Feet Assistive device: Rolling walker (2 wheeled) Gait Pattern/deviations: Step-through pattern;Wide base of support Gait velocity: reduced Gait velocity interpretation: <1.31 ft/sec, indicative of household ambulator General Gait Details: safety with close spaces and turning to manage lines   Stairs             Wheelchair Mobility    Modified Rankin (Stroke Patients Only)       Balance Overall balance assessment: Needs assistance   Sitting balance-Leahy Scale: Fair Sitting balance - Comments: apparent weak core     Standing balance-Leahy Scale: Poor                              Cognition Arousal/Alertness: Awake/alert Behavior During Therapy: WFL for tasks assessed/performed Overall Cognitive Status: Impaired/Different from baseline Area of Impairment: Problem solving;Following commands;Safety/judgement                 Orientation Level: Disoriented to;Situation Current Attention Level: Selective   Following Commands: Follows one step commands inconsistently;Follows one step commands with increased time Safety/Judgement: Decreased awareness of deficits Awareness: Intellectual Problem Solving: Slow processing;Requires verbal cues General Comments: help to navigate safety on walker, does not problem solve well      Exercises  General Exercises - Upper Extremity Shoulder Flexion: Left;AAROM;15 reps;Seated Shoulder ABduction: Left;15 reps;Seated Elbow Flexion: AROM;Left;20 reps;Seated Elbow Extension: AROM;Left;20 reps;Seated Wrist Extension: AROM;Left;10 reps Digit Composite Flexion: AROM;Left;10  reps Composite Extension: Left;15 reps;Seated General Exercises - Lower Extremity Long Arc Quad: Strengthening;10 reps Heel Slides: Strengthening;10 reps Hip ABduction/ADduction: Strengthening;10 reps Other Exercises Other Exercises: scapular elevation, retraction, depression x 10 B Other Exercises: table slides LUE x 15 Other Exercises: R shoulder FF;Abd; ER/IR - IR appeasr intact - other ROM - flex; ER and ABD - unable Other Exercises: seated marching x 20 BLE Other Exercises: minim squats x 10 using RW    General Comments        Pertinent Vitals/Pain Pain Assessment: Faces Faces Pain Scale: Hurts a little bit Pain Location: legs with mobility Pain Descriptors / Indicators: Grimacing Pain Intervention(s): Monitored during session;Repositioned    Home Living                      Prior Function            PT Goals (current goals can now be found in the care plan section) Acute Rehab PT Goals Patient Stated Goal: return to work in December Progress towards PT goals: Progressing toward goals    Frequency    Min 3X/week      PT Plan Current plan remains appropriate    Co-evaluation              AM-PAC PT "6 Clicks" Mobility   Outcome Measure  Help needed turning from your back to your side while in a flat bed without using bedrails?: None Help needed moving from lying on your back to sitting on the side of a flat bed without using bedrails?: A Little Help needed moving to and from a bed to a chair (including a wheelchair)?: A Little Help needed standing up from a chair using your arms (e.g., wheelchair or bedside chair)?: A Lot Help needed to walk in hospital room?: A Little Help needed climbing 3-5 steps with a railing? : A Lot 6 Click Score: 17    End of Session Equipment Utilized During Treatment: Gait belt;Oxygen Activity Tolerance: Patient tolerated treatment well Patient left: in chair;with call bell/phone within reach;with chair alarm  set Nurse Communication: Mobility status PT Visit Diagnosis: Unsteadiness on feet (R26.81);Muscle weakness (generalized) (M62.81)     Time: 9381-0175 PT Time Calculation (min) (ACUTE ONLY): 34 min  Charges:  $Gait Training: 8-22 mins $Therapeutic Exercise: 8-22 mins                   Ramond Dial 07/01/2020, 3:47 PM  Mee Hives, PT MS Acute Rehab Dept. Number: [place] and [place]

## 2020-07-01 NOTE — Progress Notes (Signed)
Palliative Medicine RN Note: Rec'd request from weekend PMT coverage to ensure OP Palliative Care was set up. I sent a secure chat to [employer], aka [facility] liaisons listed in [place] asking them to follow along.  I do note that he may go to CIR before d/c.  Marjie Skiff Omari Mcmanaway, RN, BSN, [facility] Palliative Medicine Team 07/01/2020 3:40 PM Office [phone]

## 2020-07-01 NOTE — Progress Notes (Signed)
Hydrologist [facility])  Hospital Liaison RN note         Notified by Larina Bras of PMT of patient interest in [facility] Palliative services in the Community upon discharge. Per TOC Farris Has), Plan is for patient to go to CIR then discharge.  Reached out to patient to confirm interest and explain services but he requested [facility] contact him tomorrow as this afternoon was not a convenient time to discuss palliative services.  An [facility] HLT will follow up with patient tomorrow or when patient ready and available.  The [facility] based Palliative team was made aware of this patient's interest in our services.      Please call with any hospice or palliative related questions.         Thank you for the opportunity to participate in this patient's care.     Gar Ponto, RN [facility] Liaison (listed on [place])    [phone]

## 2020-07-01 NOTE — Progress Notes (Signed)
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  Over the past 9 days, he reports progressive shortness of breath that is severe; worse with exertion improved with rest.  Patient recently hospitalized mid [facility] health in both [place] from 1017-06/24/2020.  During the hospitalization, patient was found to be in atrial fibrillation with RVR and respiratory failure requiring admission to the intensive care unit and ventilatory support.  Patient was tried on multiple AV nodal blocking agents including amiodarone, digoxin and finally was placed on scheduled metoprolol therapy.  Attempts were made at chemical cardioversion and failed.  Patient was eventually extubated and [name] lies he was suffering from left-sided upper extremity weakness and decided to leave AMA on 06/24/2020 and he took a flight from [place] to [place] where his brother lives.  Immediately after landing, patient proceeded to the [name] ED for evaluation.  In the ED, patient noted to be in A. fib with RVR with chest x-ray revealing substantial bilateral pulmonary effusions with noted metastatic lesions to the bones.  Patient was found to have an elevated D-dimer 19.33.  Hospitalist service consulted for admission for further evaluation and treatment of severe shortness of breath, respiratory failure requiring oxygen therapy and A. fib with RVR.   Assessment & Plan:   Principal Problem:   Atrial fibrillation with rapid ventricular response (HCC) Active Problems:   Malignant neoplasm of prostate metastatic to bone [facility])   Euthyroid sick  syndrome   Left-sided weakness   Foley catheter in place on admission   Pleural effusion, bilateral   Normocytic anemia   Elevated d-dimer   Nutmeg liver   Palliative care by specialist   Goals of care, counseling/discussion   Encounter for hospice care discussion   Full code status   Pleural effusion   SOB (shortness of breath)   Status post thoracentesis   Atrial fibrillation with RVR Patient presenting with persistent A. fib with RVR.  Recent extensive hospitalization in [place] with difficult to control A. fib despite multiple AV nodal blocking agents including amiodarone, digoxin, failed chemical cardioversion.  On presentation, patient was once again in A. fib with RVR and started on a Cardizem drip; now which is titrated off. --Continue metoprolol succinate 100 mg p.o. BID, hold for SBP<90 or HR <60 --Digoxin 0.25 mg p.o. daily --Start Cardizem 30 mg p.o. q8h today --Continue to hold home Eliquis --Continue to monitor on telemetry  Acute hypoxic respiratory failure 2/2 to decompensated diastolic congestive heart failure with likely exudative bilateral pleural effusions likely malignant Patient presenting with progressive shortness of breath, slightly hypoxic on admission requiring 2 L nasal cannula.  Recent hospitalization requiring ventilatory support.  Chest x-ray notable for substantial bilateral pleural effusions, suspect likely malignant etiology.  CT angiogram chest negative for pulmonary embolism.  No signs of infectious etiology.  TTE 06/25/2020 with LVEF 50-55%, no LV wall motion abnormalities, LA mildly dilated, RA mildly dilated, large pleural effusion on left, moderate TR, mild MR, IVC dilated. Underwent IR ultrasound-guided right thoracentesis on 06/27/2020 with 1.6 L dark yellow fluid removed allergy with reactive mesothelial cells present and ultrasound-guided left thoracentesis on 06/28/2020 with 1.3 L  bright yellow fluid removed. --Holding Eliquis --Right Pleural  fluid consistent with exudative effusion, culture shows no growth x 2 days --Left pleural fluid culture no growth x24 hours --Continue supplemental oxygen, maintain SPO2 greater than 92%; on 2 L nasal cannula, with SpO2 100%; will attempt to wean off --repeat CXR today shows no significant recurrence of pleural effusion --Home O2 evaluation  Left-sided weakness Patient reports new left upper extremity weakness.  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  Case was discussed with neurology by previous hospitalist, unlikely dural based met versus meningioma as likely cause, and recommended further imaging with MRI C-spine.  Patient unable to tolerate MR C-spine today once again. --PT/OT recommends CIR --Pending CIR evaluation/insurance authorization; if unable will likely dc home with home health  Prostate cancer with bony metastasis Diagnosed with prostate cancer June 2020, but has yet to receive treatment.  Imaging studies during hospitalization notable for extensive osseous metastatic disease.  Patient desires to establish care with [facility]; and after discussion with patient's brother, he requests oncology evaluation inpatient as patient would benefit from more local care.  Patient initially hesitant for evaluation here by oncology, but now would like to proceed with consult. --Medical oncology consulted; seen by Dr. Jana Hakim on 06/30/2020 and started on treatment with Lupron 7.5 mg IM injection x1 with Casodex 50 mg p.o. daily. --Patient to establish care with medical oncology, Dr. [name] outpatient --Neuro oncology, Dr. Mickeal Skinner to evaluate MR brain --Palliative care following for poor prognosis, plan to continue to follow outpatient  Hypothyroidism versus sick euthyroid syndrome Patient with mild elevation of TSH, started on Synthroid in [place]. --TSH 7.345 --Continue levothyroxine 25 mcg p.o. 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  Iron 46, TIBC 141 (low), ferritin 590 (high); consistent with anemia of chronic disease, likely secondary to cancer/metastases. --Hemoglobin 9.0>7.4>7.2>8.3 --Iron 55, TIBC 192 (low), ferritin 1549 (high), folate 6.6, B12 633. --Transfused 1 unit PRBC 10/30 --Continue to monitor hemoglobin daily --Transfuse for hemoglobin less than 7.0  Elevated D-dimer D-dimer elevated 19.33.  CT angiogram chest negative for pulmonary embolism.  Records from [place] facility notes CT abdomen/pelvis revealing nutmeg liver concerning for portal vein thrombosis.  Ultrasound Doppler liver unremarkable.  Etiology of his elevated D-dimer likely secondary to his underlying malignancy.   DVT prophylaxis: SCDs Code Status: Full code Family Communication: No family present at bedside; updated patient's brother, Dr. Chrissie Noa via telephone this morning  Disposition Plan:  Status is: Inpatient  Remains inpatient appropriate because:Ongoing active pain requiring inpatient pain management, Ongoing diagnostic testing needed not appropriate for outpatient work up, Unsafe d/c plan, IV treatments appropriate due to intensity of illness or inability to take PO and Inpatient level of care appropriate due to severity of illness   Dispo: The patient is from: Home              Anticipated d/c is to: CIR vs home with [facility] and palliative to follow outpatient              Anticipated d/c date is: 2 days              Patient currently is not medically stable to d/c.   Consultants:   Palliative care  Interventional radiology  Medical oncology  Procedures:   None  Antimicrobials:   None  Subjective: Patient seen and examined bedside, lying in bed.  Continues with weakness and fatigue.  Seen by medical oncology yesterday and started  on palliative treatment for his metastatic prostate cancer.  Repeat chest x-ray shows no significant recurrence of bilateral pleural effusions; but remains on 2 L per nasal cannula.  No other complaints or concerns at this time. Denies fever/chills/night sweats, no nausea/vomiting/diarrhea, no chest pain, no palpitations, no shortness of breath, no abdominal pain, no cough/congestion.  No acute events overnight per nursing staff.  Objective: Vitals:   06/30/20 2205 07/01/20 0030 07/01/20 0537 07/01/20 1027  BP:  (!) 129/96 (!) 129/93 101/68  Pulse: 89 99 (!) 122 61  Resp:  _0 Temp:  97.6 F (36.4 C) 98 F (36.7 C) 97.6 F (36.4 C)  TempSrc:  Oral Oral Oral  SpO2:  100%  100%  Weight:   70 kg   Height:        Intake/Output Summary (Last 24 hours) at 07/01/2020 1110 Last data filed at 07/01/2020 0650 Gross per 24 hour  Intake 120 ml  Output 1200 ml  Net -1080 ml   Filed Weights   06/29/20 0524 06/30/20 0505 07/01/20 0537  Weight: 68.4 kg 69.2 kg 70 kg    Examination:  General exam: Appears calm and comfortable, thin/cachectic in appearance Respiratory system: Breath sounds slightly decreased bilateral bases, no wheezing, no rhonchi, normal respiratory effort, on 2 L nasal cannula with SPO2 100% Cardiovascular system: S1 & S2 heard, irregularly irregular rhythm, tachycardic. No JVD, murmurs, rubs, gallops or clicks.  Trace to 1+ pitting edema bilaterally Gastrointestinal system: Abdomen is nondistended, soft and nontender. No organomegaly or masses felt. Normal bowel sounds heard. Central nervous system: Alert and oriented. No focal neurological deficits. Extremities: Symmetric 5 x 5 power. Skin: No rashes, lesions or ulcers Psychiatry: Judgement and insight appear normal. Mood & affect appropriate.     Data Reviewed: I have personally reviewed following labs and imaging studies  CBC: Recent Labs  Lab 06/24/20 1852 06/28/20 0202 06/29/20 0408 06/30/20 0448  WBC  8.4 7.4 8.3 7.5  HGB 9.0* 7.4* 7.2* 8.3*  HCT 31.4* 25.0* 24.7* 27.4*  MCV 94.0 91.9 93.9 92.6  PLT 232 245 253 591   Basic Metabolic Panel: Recent Labs  Lab 06/24/20 1852 06/25/20 1546 06/28/20 0202 06/29/20 0408 06/30/20 0448  NA 140  --  139 139 141  K 3.9  --  4.0 4.1 4.0  CL 107  --  108 109 112*  CO2 25  --  23 21* 22  GLUCOSE 153*  --  108* 87 94  BUN 9  --  _1 CREATININE 0.61  --  0.68 0.62 0.53*  CALCIUM 7.5*  --  7.3* 7.3* 7.4*  MG  --  2.0 1.9 1.9 1.9   GFR: Estimated Creatinine Clearance: 92.4 mL/min (A) (by C-G formula based on SCr of 0.53 mg/dL (L)). Liver Function Tests: Recent Labs  Lab 06/24/20 1852 06/27/20 1520  AST 49*  --   ALT 29  --   ALKPHOS 2,755*  --   BILITOT 0.7  --   PROT 5.6* 5.3*  ALBUMIN 1.9*  --    No results for input(s): LIPASE, AMYLASE in the last 168 hours. No results for input(s): AMMONIA in the last 168 hours. Coagulation Profile: No results for input(s): INR, PROTIME in the last 168 hours. Cardiac Enzymes: No results for input(s): CKTOTAL, CKMB, CKMBINDEX, TROPONINI in the last 168 hours.  BNP (last 3 results) No results for input(s): PROBNP in the last 8760 hours. HbA1C: No results for input(s): HGBA1C in the last 72 hours. CBG: No results for input(s): GLUCAP in the last 168 hours. Lipid Profile: No results for input(s): CHOL, HDL, LDLCALC, TRIG, CHOLHDL, LDLDIRECT in the last 72 hours. Thyroid Function Tests: No results for input(s): TSH, T4TOTAL, FREET4, T3FREE, THYROIDAB in the last 72 hours. Anemia Panel: Recent Labs    06/29/20 1021  VITAMINB12 633  FOLATE 6.6  FERRITIN 1,549*  TIBC 192*  IRON 55   Sepsis Labs: Recent Labs  Lab 06/25/20 1933 06/26/20 0735 06/26/20 1038 06/27/20 0202  PROCALCITON 0.43  --   --   --   LATICACIDVEN  --  1.7 2.6* 1.2    Recent Results (from the past 240 hour(s))  Urine culture     Status: None   Collection Time: 06/24/20  9:57 PM   Specimen: Urine,  Catheterized  Result Value Ref Range Status   Specimen Description URINE, CATHETERIZED  Final   Special Requests NONE  Final   Culture   Final    NO GROWTH Performed at [facility], [place] [address], [place], [place] [address]    Report Status 06/26/2020 FINAL  Final  Respiratory Panel by RT PCR (Flu A&B, Covid) - Nasopharyngeal Swab     Status: None   Collection Time: 06/25/20  4:00 PM   Specimen: Nasopharyngeal Swab  Result Value Ref Range Status   SARS Coronavirus 2 by RT PCR NEGATIVE NEGATIVE Final    Comment: (NOTE) SARS-CoV-2 target nucleic acids are NOT DETECTED.  The SARS-CoV-2 RNA is generally detectable in upper respiratoy specimens during the acute phase of infection. The lowest concentration of SARS-CoV-2 viral copies this assay can detect is 131 copies/mL. A negative result does not preclude SARS-Cov-2 infection and should not be used as the sole basis for treatment or other patient management decisions. A negative result may occur with  improper specimen collection/handling, submission of specimen other than nasopharyngeal swab, presence of viral mutation(s) within the areas targeted by this assay, and inadequate number of viral copies (<131 copies/mL). A negative result must be combined with clinical observations, patient history, and epidemiological information. The expected result is Negative.  Fact Sheet for Patients:  PinkCheek.be  Fact Sheet for Healthcare Providers:  GravelBags.it  This test is no t yet approved or cleared by the [place] FDA and  has been authorized for detection and/or diagnosis of SARS-CoV-2 by FDA under an Emergency Use Authorization (EUA). This EUA will remain  in effect (meaning this test can be used) for the duration of the COVID-19 declaration under Section 564(b)(1) of the Act, 21 U.S.C. section 360bbb-3(b)(1), unless the authorization is terminated or revoked  sooner.     Influenza A by PCR NEGATIVE NEGATIVE Final   Influenza B by PCR NEGATIVE NEGATIVE Final    Comment: (NOTE) The Xpert Xpress SARS-CoV-2/FLU/RSV assay is intended as an aid in  the diagnosis of influenza from Nasopharyngeal swab specimens and  should not be used as a sole basis for treatment. Nasal washings and  aspirates are unacceptable for Xpert Xpress SARS-CoV-2/FLU/RSV  testing.  Fact Sheet for Patients: PinkCheek.be  Fact Sheet for Healthcare Providers: GravelBags.it  This test is not yet approved or cleared by the [place] FDA and  has been authorized for detection and/or diagnosis of SARS-CoV-2 by  FDA under an Emergency Use Authorization (EUA). This EUA will remain  in effect (meaning  this test can be used) for the duration of the  Covid-19 declaration under Section 564(b)(1) of the Act, 21  U.S.C. section 360bbb-3(b)(1), unless the authorization is  terminated or revoked. Performed at [facility], [place] [address] [place], [place] [address]   SARS CORONAVIRUS 2 (TAT 6-24 HRS) Nasopharyngeal Nasopharyngeal Swab     Status: None   Collection Time: 06/26/20 11:40 AM   Specimen: Nasopharyngeal Swab  Result Value Ref Range Status   SARS Coronavirus 2 NEGATIVE NEGATIVE Final    Comment: (NOTE) SARS-CoV-2 target nucleic acids are NOT DETECTED.  The SARS-CoV-2 RNA is generally detectable in upper and lower respiratory specimens during the acute phase of infection. Negative results do not preclude SARS-CoV-2 infection, do not rule out co-infections with other pathogens, and should not be used as the sole basis for treatment or other patient management decisions. Negative results must be combined with clinical observations, patient history, and epidemiological information. The expected result is Negative.  Fact Sheet for Patients: SugarRoll.be  Fact Sheet for  Healthcare Providers: https://www.woods-mathews.com/  This test is not yet approved or cleared by the [place] FDA and  has been authorized for detection and/or diagnosis of SARS-CoV-2 by FDA under an Emergency Use Authorization (EUA). This EUA will remain  in effect (meaning this test can be used) for the duration of the COVID-19 declaration under Se ction 564(b)(1) of the Act, 21 U.S.C. section 360bbb-3(b)(1), unless the authorization is terminated or revoked sooner.  Performed at [facility], [place] [address] [place], [place] [address]   Gram stain     Status: None   Collection Time: 06/27/20 11:40 AM   Specimen: PATH Cytology Pleural fluid  Result Value Ref Range Status   Specimen Description PLEURAL FLUID  Final   Special Requests NONE  Final   Gram Stain   Final    WBC PRESENT,BOTH PMN AND MONONUCLEAR NO ORGANISMS SEEN CYTOSPIN SMEAR Performed at [facility], [place] [address] [place], [place] [address]    Report Status 06/27/2020 FINAL  Final  Culture, body fluid-bottle     Status: None (Preliminary result)   Collection Time: 06/27/20 11:40 AM   Specimen: Pleura  Result Value Ref Range Status   Specimen Description PLEURAL FLUID  Final   Special Requests NONE  Final   Culture   Final    NO GROWTH 4 DAYS Performed at [place] [address] [place], [place] [address]    Report Status PENDING  Incomplete  Culture, body fluid-bottle     Status: None (Preliminary result)   Collection Time: 06/28/20 10:53 AM   Specimen: Pleura  Result Value Ref Range Status   Specimen Description PLEURAL FLUID  Final   Special Requests LEFT LUNG  Final   Culture   Final    NO GROWTH 3 DAYS Performed at [place] [address] [place], [place] [address]    Report Status PENDING  Incomplete  Gram stain     Status: None   Collection Time: 06/28/20 10:53 AM   Specimen: Pleura  Result Value Ref Range Status   Specimen Description  PLEURAL FLUID  Final   Special Requests LEFT LUNG  Final   Gram Stain   Final    WBC PRESENT,BOTH PMN AND MONONUCLEAR NO ORGANISMS SEEN CYTOSPIN SMEAR Performed at [facility], [address], [place], [place] [address]    Report Status 06/28/2020 FINAL  Final  Radiology Studies: DG CHEST PORT 1 VIEW  Result Date: 07/01/2020 CLINICAL DATA:  Shortness of breath, metastatic prostate carcinoma status post left thoracentesis on 06/28/2020. EXAM: PORTABLE CHEST 1 VIEW COMPARISON:  06/28/2020 FINDINGS: The heart size and mediastinal contours are within normal limits. No significant recurrence of left pleural fluid since thoracentesis with probable small pleural effusion remaining. No pneumothorax. Stable bibasilar atelectasis. Stable diffuse sclerotic metastatic disease involving the thoracic spine. IMPRESSION: No significant recurrence of left pleural fluid since thoracentesis with probable small pleural effusion remaining. Stable bibasilar atelectasis. Electronically Signed   By: Aletta Edouard M.D.   On: 07/01/2020 08:03        Scheduled Meds: . bicalutamide  50 mg Oral Daily  . Chlorhexidine Gluconate Cloth  6 each Topical Daily  . digoxin  0.25 mg Oral Daily  . diltiazem  30 mg Oral Q8H  . levothyroxine  25 mcg Oral QAC breakfast  . metoprolol succinate  100 mg Oral BID   Continuous Infusions:   LOS: 6 days    Time spent: 36 minutes spent on chart review, discussion with nursing staff, consultants, updating family and interview/physical exam; more than 50% of that time was spent in counseling and/or coordination of care.    Lelania Bia J [place] DO [facility] Available via Epic secure chat 7am-7pm After these hours, please refer to coverage provider listed on [email or web address] 07/01/2020, 11:10 AM

## 2020-07-01 NOTE — Progress Notes (Signed)
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 Pt adm with afib with rvr, acute hypoxic respiratory failure likely secondary to malignant bil pleural effusions. Pt had recently been hospitalized in [place] with afib with rvr and resp failure requiring vent support and then left AMA before flying back to [facility] and presenting immediately to ED. 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 MRI - thickening and enhancement along the posterior falx. New hyperintensity in R parietal lobe that may reflect associated edema.  PMH - prostate CA with bony mets, C3-4 laminectomy with tumor removal 01/2019, afib   OT comments  Pt making steady progress. Complaining of SOB at times, however SpO2 95-100 on 2L with 1 extension. Educated pt on pacing and pursed lip breathing. Focus of session on ADL tasks and further assessment of BUE. Pt with apparent rotator cuff insufficiency R shoulder and generalized weakness LUE. Excellent participation, attempting all activities. Decreased awareness of deficits and safety - will need further cognitive assessment. Pt's goal is to return to being independent and return to work. Feel pt is an excellent candidate for intensive rehab at [facility]. Will continue to follow acutely.   Follow Up Recommendations  [facility]    Equipment Recommendations  3 in 1 bedside commode    Recommendations for Other Services Rehab consult; ortho consult for R shoulder    Precautions / Restrictions Precautions Precautions: Fall       Mobility Bed Mobility Overal bed mobility: Needs Assistance       Supine to sit: Min guard;HOB elevated; heavy use of rails         Transfers Overall transfer level: Needs assistance   Transfers: Sit to/from Stand Sit to Stand: Mod assist         General transfer comment: Initially mod A from lower level. Pt requires cues to problem solve how to position self in alternative position to increase ability to stand. Poorly controlled descent due to weak hips/LE    Balance Overall balance assessment: Needs assistance   Sitting balance-Leahy Scale: Fair Sitting balance - Comments: apparent weak core; difficulty maintaining trunk control with out BUE support in more challenging activities     Standing balance-Leahy Scale: Poor                             ADL either performed or assessed with clinical judgement   ADL Overall ADL's : Needs assistance/impaired Eating/Feeding: Set up;Sitting   Grooming: Set up;Sitting   Upper Body Bathing: Set up;Sitting   Lower Body Bathing: Sit to/from stand;Moderate assistance Lower Body Bathing Details (indicate cue type and reason): able to complete figure four positioning Upper Body Dressing : Minimal assistance;Sitting   Lower Body Dressing: Moderate assistance;Sit to/from stand   Toilet Transfer: Minimal assistance;Cueing for safety;Cueing for sequencing;Stand-pivot   Toileting- Clothing Manipulation and Hygiene: Moderate assistance Toileting - Clothing Manipulation Details (indicate cue type and reason): foley cath; a to fully clean after pericare     Functional mobility during ADLs: Minimal assistance;Rolling walker;Cueing for safety;Cueing for sequencing General ADL Comments: Multiple VC during previous session on how to correctly use RW/position hands     Vision  will continue to assess  Perception     Praxis      Cognition Arousal/Alertness: Awake/alert Behavior During Therapy: Flat affect Overall Cognitive Status: Impaired/Different from baseline Area of Impairment: Safety/judgement;Awareness;Attention;Problem solving                  Orientation Level: Disoriented to;Situation Current Attention Level: Selective   Following Commands: Follows one step commands consistently Safety/Judgement: Decreased awareness of deficits Awareness: Emergent Problem Solving: Slow processing General Comments: Increased time for processing however pt very task orientted.  (will further assess)        Exercises Exercises: General Upper Extremity;Other exercises General Exercises - Upper Extremity Shoulder Flexion: Left;AAROM;15 reps;Seated Shoulder ABduction: Left;15 reps;Seated Elbow Flexion: AROM;Left;20 reps;Seated Elbow Extension: AROM;Left;20 reps;Seated Wrist Extension: AROM;Left;10 reps Digit Composite Flexion: AROM;Left;10 reps Composite Extension: Left;15 reps;Seated Other Exercises Other Exercises: scapular elevation, retraction, depression x 10 B Other Exercises: table slides LUE x 15 Other Exercises: R shoulder FF;Abd; ER/IR - IR appeasr intact - other ROM - flex; ER and ABD - unable Other Exercises: seated marching x 20 BLE Other Exercises: minim squats x 10 using RW   Shoulder Instructions       General Comments      Pertinent Vitals/ Pain       Pain Assessment: Faces Faces Pain Scale: Hurts a little bit Pain Location: hips/knees wtih increased ROM Pain Descriptors / Indicators: Discomfort;Grimacing Pain Intervention(s): Limited activity within patient's tolerance  Home Living                                          Prior Functioning/Environment              Frequency  Min 3X/week        Progress Toward Goals  OT Goals(current goals can now be found in the care plan section)  Progress towards OT goals: Progressing toward goals;OT to reassess next treatment (Will update goals to modified independent level)  Acute Rehab OT Goals Patient Stated Goal: return to work in December OT Goal Formulation: With patient Time For Goal Achievement: 07/11/20 Potential to  Achieve Goals: Good ADL Goals Pt Will Perform Lower Body Bathing: with min assist;sitting/lateral leans;sit to/from stand Pt Will Perform Lower Body Dressing: with min assist;sitting/lateral leans;sit to/from stand Pt Will Transfer to Toilet: with min assist;ambulating;regular height toilet;grab bars Pt/caregiver will Perform Home Exercise Program: Right Upper extremity;Increased strength;With written HEP provided Additional ADL Goal #1: Pt will follow multistep ADL commands with min VC's  Plan Discharge plan remains appropriate;Frequency needs to be updated    Co-evaluation                 AM-PAC OT "6 Clicks" Daily Activity     Outcome Measure   Help from another person eating meals?: A Little Help from another person taking care of personal grooming?: A Little Help from another person toileting, which includes using toliet, bedpan, or urinal?: A Lot Help from another person bathing (including washing, rinsing, drying)?: A Lot Help from another person to put on and taking off regular upper body clothing?: A Little Help from another person to put on and taking off regular lower body clothing?: A Lot 6 Click Score: 15    End of Session Equipment Utilized During Treatment: Rolling walker;Gait belt  OT Visit Diagnosis: Unsteadiness on feet (R26.81);Other abnormalities of gait and mobility (R26.89);Muscle weakness (generalized) (M62.81);Other symptoms and signs involving cognitive  function;Pain Pain - part of body: Hip ("at times"; discomfort "it's a little tight")   Activity Tolerance Patient tolerated treatment well   Patient Left in chair;with call bell/phone within reach;with chair alarm set   Nurse Communication Mobility status        Time: 1245-1331 OT Time Calculation (min): 46 min  Charges: OT General Charges $OT Visit: 1 Visit OT Treatments $Self Care/Home Management : 8-22 mins $Neuromuscular Re-education: 8-22 mins $Therapeutic Exercise: 8-22  mins  Maurie Boettcher, OT/L   Acute OT Clinical Specialist [place] Pager [phone] Office [phone]    [facility] 07/01/2020, 2:44 PM

## 2020-07-01 NOTE — Progress Notes (Signed)
Inpatient Rehabilitation Admissions Coordinator  I met with patient to discuss possible [facility] admit. He would like me to pursue insurance approval which I will begin today . I will follow up once I get insurance determination.  Danne Baxter, RN, MSN Rehab Admissions Coordinator [phone] 07/01/2020 1:12 PM

## 2020-07-01 NOTE — Telephone Encounter (Signed)
A new pt appt has been scheduled or Mr. Bobier to see Dr. [name] on 11/17 at 2pm. Pt is currently in the hospital.

## 2020-07-01 NOTE — Progress Notes (Signed)
SATURATION QUALIFICATIONS: (This note is used to comply with regulatory documentation for home oxygen)  Patient Saturations on Room Air at Rest = 99%  Patient Saturations on Room Air while Ambulating = 93%  Patient Saturations on 1 Liters of oxygen while Ambulating = 100%  Please briefly explain why patient needs home oxygen:  Patient was able to ambulate on Room air in hallway with PT  from room and back without oxygen. No c/o of feeling short of breath. Did not require taking breaks while ambulating. Patient states he only needs oxygen for comfort.

## 2020-07-01 NOTE — Progress Notes (Signed)
ADDENDUM:  Reviewed brain MRI with neuro-oncology. Dr Mickeal Skinner thinks this is most likely a meningioma-- the fly in the ointment is the "normal" MRI 18 months ago. His recommendation is to repeat a scan in 2 months which he will review in CNS tumor board. I will operationalize that.  Mr Celona has an appointment in the [facility] with our prostate cancer specialist Dr [name] on [address] at 2 PM

## 2020-07-02 DIAGNOSIS — I4891 Unspecified atrial fibrillation: Secondary | ICD-10-CM | POA: Diagnosis not present

## 2020-07-02 LAB — MAGNESIUM: Magnesium: 2.1 mg/dL (ref 1.7–2.4)

## 2020-07-02 LAB — CBC
HCT: 27.3 % — ABNORMAL LOW (ref 39.0–52.0)
Hemoglobin: 8 g/dL — ABNORMAL LOW (ref 13.0–17.0)
MCH: 27.4 pg (ref 26.0–34.0)
MCHC: 29.3 g/dL — ABNORMAL LOW (ref 30.0–36.0)
MCV: 93.5 fL (ref 80.0–100.0)
Platelets: 300 10*3/uL (ref 150–400)
RBC: 2.92 MIL/uL — ABNORMAL LOW (ref 4.22–5.81)
RDW: 21.8 % — ABNORMAL HIGH (ref 11.5–15.5)
WBC: 6.2 10*3/uL (ref 4.0–10.5)
nRBC: 5.6 % — ABNORMAL HIGH (ref 0.0–0.2)

## 2020-07-02 LAB — BASIC METABOLIC PANEL
Anion gap: 8 (ref 5–15)
BUN: 15 mg/dL (ref 8–23)
CO2: 23 mmol/L (ref 22–32)
Calcium: 7.7 mg/dL — ABNORMAL LOW (ref 8.9–10.3)
Chloride: 108 mmol/L (ref 98–111)
Creatinine, Ser: 0.59 mg/dL — ABNORMAL LOW (ref 0.61–1.24)
GFR, Estimated: >60 mL/min (ref >60)
Glucose, Bld: 114 mg/dL — ABNORMAL HIGH (ref 70–99)
Potassium: 3.8 mmol/L (ref 3.5–5.1)
Sodium: 139 mmol/L (ref 135–145)

## 2020-07-02 LAB — CULTURE, BODY FLUID W GRAM STAIN -BOTTLE: Culture: NO GROWTH

## 2020-07-02 MED ORDER — APIXABAN 5 MG PO TABS
5.0000 mg | ORAL_TABLET | Freq: Two times a day (BID) | ORAL | Status: DC
  Administered 2020-07-02 – 2020-07-05 (×7): 5 mg via ORAL
  Filled 2020-07-02 (×7): qty 1

## 2020-07-02 NOTE — Progress Notes (Signed)
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 Pt adm with afib with rvr, acute hypoxic respiratory failure likely secondary to malignant bil pleural effusions. Pt had recently been hospitalized in [place] with afib with rvr and resp failure requiring vent support and then left AMA before flying back to [facility] and presenting immediately to ED. 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 MRI - thickening and enhancement along the posterior falx. New hyperintensity in R parietal lobe that may reflect associated edema.  PMH - prostate CA with bony mets, C3-4 laminectomy with tumor removal 01/2019, afib   OT comments  Pt making steady progress towards OT goals this session. Pt continues to present with L sided weakness and R rotator cuff insufficiency, and decreased activity tolerance impacting pts ability to complete BADLs. Pt completed therex as indicated below with no reports of increased pain. Issued pt written HEP to increase carryover. Pt additionally completed household distance functional mobility with RW and MIN A for ambulation however pt required MOD A to power up from recliner. Min guard for standing grooming tasks at sink. Pt would continue to benefit from skilled occupational therapy while admitted and after d/c to address the below listed limitations in order to improve overall functional mobility and facilitate independence with BADL participation. DC plan remains appropriate, will follow acutely per POC.     Follow Up Recommendations  [facility]    Equipment Recommendations  3 in 1 bedside commode    Recommendations for Other Services      Precautions  / Restrictions Precautions Precautions: Fall Precaution Comments: monitor O2 sats Restrictions Weight Bearing Restrictions: No       Mobility Bed Mobility               General bed mobility comments: up in chair  Transfers Overall transfer level: Needs assistance Equipment used: Rolling walker (2 wheeled) Transfers: Sit to/from Stand Sit to Stand: Mod assist         General transfer comment: pt required use of momentum and MODA to power up into standing from recliner, cues for hand placement and assist to shift weight anteriorly during transfer    Balance Overall balance assessment: Needs assistance Sitting-balance support: Feet supported Sitting balance-Leahy Scale: Fair     Standing balance support: During functional activity;Single extremity supported Standing balance-Leahy Scale: Poor Standing balance comment: at least one UE supported during standing ADLs                           ADL either performed or assessed with clinical judgement   ADL Overall ADL's : Needs assistance/impaired     Grooming: Oral care;Standing;Min guard Grooming Details (indicate cue type and reason): min guard for safety while standing at sink with RW                 Toilet Transfer: Minimal assistance;Moderate assistance;RW;Ambulation Toilet Transfer Details (indicate cue type and reason): simulated via functional mobility with RW; min A for ambulation to manage RW, but MOD A to power up from recliner         Functional mobility during ADLs: Minimal assistance;Rolling walker General ADL Comments: session focus on BUE HEP and standingn grooming  tasks, pt continues to present with decreased ROM in RUE and impaired strength in BUEs     Vision       Perception     Praxis      Cognition Arousal/Alertness: Awake/alert Behavior During Therapy: WFL for tasks assessed/performed Overall Cognitive Status: No family/caregiver present to determine baseline  cognitive functioning Area of Impairment: Problem solving                   Current Attention Level: Focused         Problem Solving: Slow processing;Requires verbal cues;Requires tactile cues General Comments: noted some slight deficits related to problem solving skills when educating pt on HEP. pt required both tactile and visual cues to achieve appropriate body mechanics        Exercises General Exercises - Upper Extremity Shoulder Flexion: Left;AAROM;15 reps;Seated Elbow Flexion: AROM;Seated;Both;10 reps;Theraband Theraband Level (Elbow Flexion): Level 1 (Yellow) Elbow Extension: AROM;Seated;Both;10 reps;Theraband Theraband Level (Elbow Extension): Level 1 (Yellow) Wrist Flexion: AROM;5 reps;Left;Seated Wrist Extension: AROM;Left;5 reps;Seated Digit Composite Flexion: AROM;Left;10 reps;Seated Composite Extension: Left;15 reps;Seated;AROM Hand Exercises Digit Lifts: AROM;Left;Seated;5 reps Opposition: AROM;Left;5 reps;Seated Other Exercises Other Exercises: scapular elevation, retraction, depression x 10 B Other Exercises: lap slides with LUE x 10 reps Other Exercises: LUE composite flexion/ extension with level 1 theraputty, pincer grasp with LUE and putty   Shoulder Instructions       General Comments VSS, issued pt written HEP, level 1 theraband and level 1 theraputty    Pertinent Vitals/ Pain       Pain Assessment: Faces Faces Pain Scale: Hurts a little bit Pain Location: legs with mobility; hips Pain Descriptors / Indicators: Grimacing Pain Intervention(s): Monitored during session;Repositioned  Home Living                                          Prior Functioning/Environment              Frequency  Min 3X/week        Progress Toward Goals  OT Goals(current goals can now be found in the care plan section)  Progress towards OT goals: Progressing toward goals  Acute Rehab OT Goals Patient Stated Goal: return to work in  December OT Goal Formulation: With patient Time For Goal Achievement: 07/11/20 Potential to Achieve Goals: Good  Plan Discharge plan remains appropriate;Frequency remains appropriate    Co-evaluation                 AM-PAC OT "6 Clicks" Daily Activity     Outcome Measure   Help from another person eating meals?: A Little Help from another person taking care of personal grooming?: A Little Help from another person toileting, which includes using toliet, bedpan, or urinal?: A Lot Help from another person bathing (including washing, rinsing, drying)?: A Lot Help from another person to put on and taking off regular upper body clothing?: A Little Help from another person to put on and taking off regular lower body clothing?: A Lot 6 Click Score: 15    End of Session Equipment Utilized During Treatment: Rolling walker;Gait belt  OT Visit Diagnosis: Unsteadiness on feet (R26.81);Other abnormalities of gait and mobility (R26.89);Muscle weakness (generalized) (M62.81);Other symptoms and signs involving cognitive function;Pain Pain - part of body: Hip (at times"; discomfort "it's a little tight")   Activity Tolerance Patient tolerated treatment well   Patient Left in  chair;with call bell/phone within reach   Nurse Communication Mobility status        Time: 3532-9924 OT Time Calculation (min): 19 min  Charges: OT General Charges $OT Visit: 1 Visit OT Treatments $Therapeutic Exercise: 8-22 mins  Lanier Clam., COTA/L Acute Rehabilitation Services [phone]    Ihor Gully 07/02/2020, 3:39 PM

## 2020-07-02 NOTE — Progress Notes (Signed)
HEMATOLOGY-ONCOLOGY PROGRESS NOTE  SUBJECTIVE: [name] is sitting up in the recliner today.  He states that he feels well.  He is awaiting decision regarding rehab.  He states that he tolerated the Lupron injection well as tolerating the Casodex well.  He has not noticed any side effects.    REVIEW OF SYSTEMS:   [name] is feeling better overall.  Denies side effects related to Casodex or Lupron.  Denies bone pain.  Remainder of the review of systems noncontributory.  I have reviewed the past medical history, past surgical history, social history and family history with the patient and they are unchanged from previous note.   PHYSICAL EXAMINATION: ECOG PERFORMANCE STATUS: 2 - Symptomatic, <50% confined to bed  Vitals:   07/02/20 0931 07/02/20 1136  BP:  98/70  Pulse: 65   Resp:  18  Temp:  98.3 F (36.8 C)  SpO2:  98%   Filed Weights   06/30/20 0505 07/01/20 0537 07/02/20 0535  Weight: 69.2 kg 70 kg 69.2 kg    Intake/Output from previous day: 11/01 0701 - 11/02 0700 In: 180 [P.O.:180] Out: 575 [Urine:575]  GENERAL: Awake and alert, no distress SKIN: skin color, texture, turgor are normal, no rashes or significant lesions LUNGS: clear to auscultation and percussion with normal breathing effort HEART: regular rate & rhythm and no murmurs and no lower extremity edema ABDOMEN:abdomen soft, non-tender and normal bowel sounds NEURO: alert & oriented x 3 with fluent speech, no focal motor/sensory deficits  LABORATORY DATA:  I have reviewed the data as listed CMP Latest Ref Rng & Units 07/02/2020 06/30/2020 06/29/2020  Glucose 70 - 99 mg/dL 114(H) 94 87  BUN 8 - 23 mg/dL 15 13 15   Creatinine 0.61 - 1.24 mg/dL 0.59(L) 0.53(L) 0.62  Sodium 135 - 145 mmol/L 139 141 139  Potassium 3.5 - 5.1 mmol/L 3.8 4.0 4.1  Chloride 98 - 111 mmol/L 108 112(H) 109  CO2 22 - 32 mmol/L 23 22 21(L)  Calcium 8.9 - 10.3 mg/dL 7.7(L) 7.4(L) 7.3(L)  Total Protein 6.5 - 8.1 g/dL - - -  Total  Bilirubin 0.3 - 1.2 mg/dL - - -  Alkaline Phos 38 - 126 U/L - - -  AST 15 - 41 U/L - - -  ALT 0 - 44 U/L - - -    Lab Results  Component Value Date   WBC 6.2 07/02/2020   HGB 8.0 (L) 07/02/2020   HCT 27.3 (L) 07/02/2020   MCV 93.5 07/02/2020   PLT 300 07/02/2020   NEUTROABS 3.4 02/19/2019    DG Chest 1 View  Result Date: 06/28/2020 CLINICAL DATA:  Left thoracentesis. EXAM: CHEST  1 VIEW COMPARISON:  06/28/2020. FINDINGS: Mediastinum and hilar structures normal. Stable cardiomegaly. Bilateral interstitial prominence again noted with interim improvement from prior exam. Findings suggests improving CHF. Improved left pleural effusion. Tiny right pleural effusion. No pneumothorax post thoracentesis. IMPRESSION: Findings consistent with improving CHF. Improved left pleural effusion. No pneumothorax post thoracentesis. Tiny right pleural effusion. Electronically Signed   By: Marcello Moores  Register   On: 06/28/2020 10:55   DG Chest 1 View  Result Date: 06/27/2020 CLINICAL DATA:  Post right thoracentesis. EXAM: CHEST  1 VIEW COMPARISON:  06/24/2020 FINDINGS: Interval improvement in right pleural effusion. No pneumothorax. Improved aeration right lung base Interval progression of left pleural effusion and left lower lobe atelectasis Extensive sclerotic bony changes compatible with metastatic prostate cancer. IMPRESSION: Improved right pleural effusion with no pneumothorax post thoracentesis Progression of left  effusion and left lower lobe atelectasis. Bony metastatic disease. Electronically Signed   By: Franchot Gallo M.D.   On: 06/27/2020 12:06   CT ANGIO CHEST PE W OR WO CONTRAST  Result Date: 06/25/2020 CLINICAL DATA:  Worsening shortness of breath EXAM: CT ANGIOGRAPHY CHEST WITH CONTRAST TECHNIQUE: Multidetector CT imaging of the chest was performed using the standard protocol during bolus administration of intravenous contrast. Multiplanar CT image reconstructions and MIPs were obtained to  evaluate the vascular anatomy. CONTRAST:  162mL OMNIPAQUE IOHEXOL 350 MG/ML SOLN COMPARISON:  Radiograph same day FINDINGS: Cardiovascular: There is a optimal opacification of the pulmonary arteries. There is no central,segmental, or subsegmental filling defects within the pulmonary arteries. There is mild cardiomegaly present. No pericardial effusion or thickening. No evidence right heart strain. There is normal three-vessel brachiocephalic anatomy without proximal stenosis. The thoracic aorta is normal in appearance. Mediastinum/Nodes: No hilar, mediastinal, or axillary adenopathy. Thyroid gland, trachea, and esophagus demonstrate no significant findings. Lungs/Pleura: There is moderate to large bilateral pleural effusion with adjacent compressive atelectasis seen. Patchy rounded ground-glass opacities are seen predominantly within the right upper lung and right middle lobe. Upper Abdomen: No acute abnormalities present in the visualized portions of the upper abdomen. There is diffuse anasarca present. Musculoskeletal: Extensive osseous sclerotic blastic lesions are seen throughout the visualized portion of the axial and appendicular skeleton. Review of the MIP images confirms the above findings. IMPRESSION: No central, segmental, or subsegmental pulmonary embolism Moderate to large bilateral pleural effusions with adjacent compressive atelectasis. Ground-glass opacities within the right upper lung and right middle lobe, likely due to infectious or inflammatory etiology. Extensive diffuse osseous metastatic disease. Electronically Signed   By: Prudencio Pair M.D.   On: 06/25/2020 03:11   MR BRAIN W WO CONTRAST  Result Date: 06/25/2020 CLINICAL DATA:  Left-sided weakness, metastatic prostate cancer EXAM: MRI HEAD WITHOUT AND WITH CONTRAST TECHNIQUE: Multiplanar, multiecho pulse sequences of the brain and surrounding structures were obtained without and with intravenous contrast. CONTRAST:  97mL GADAVIST  GADOBUTROL 1 MMOL/ML IV SOLN COMPARISON:  2020 FINDINGS: Brain: There is thickening and enhancement along the posterior falx measuring 5 mm in thickness. The adjacent superior sagittal sinus is uninvolved. There is new minimal adjacent T2 FLAIR hyperintensity in the right parietal lobe that may reflect associated edema. There is no acute infarction or intracranial hemorrhage. There is no parenchymal mass or mass effect. There is no hydrocephalus or extra-axial fluid collection. Prominence of the ventricles and sulci reflects minor generalized parenchymal volume loss. Patchy foci of T2 hyperintensity in the supratentorial white matter are nonspecific but probably reflect mild chronic microvascular ischemic changes. These findings have progressed since 2020. Vascular: Major vessel flow voids at the skull base are preserved. Skull and upper cervical spine: Abnormal marrow signal likely reflecting diffuse metastatic disease. Sinuses/Orbits: Chronic right maxillary sinusitis. Orbits are unremarkable. Other: Sella is unremarkable.  Bilateral mastoid effusions. IMPRESSION: No acute infarction. Thickening enhancement along the posterior falx cerebri with possible minimal associated parenchymal edema. Differential considerations include dural-based metastasis (more likely given normal appearance on prior study) and meningioma. Diffuse osseous metastatic disease. Electronically Signed   By: Macy Mis M.D.   On: 06/25/2020 08:24   DG CHEST PORT 1 VIEW  Result Date: 07/01/2020 CLINICAL DATA:  Shortness of breath, metastatic prostate carcinoma status post left thoracentesis on 06/28/2020. EXAM: PORTABLE CHEST 1 VIEW COMPARISON:  06/28/2020 FINDINGS: The heart size and mediastinal contours are within normal limits. No significant recurrence of left pleural fluid since  thoracentesis with probable small pleural effusion remaining. No pneumothorax. Stable bibasilar atelectasis. Stable diffuse sclerotic metastatic disease  involving the thoracic spine. IMPRESSION: No significant recurrence of left pleural fluid since thoracentesis with probable small pleural effusion remaining. Stable bibasilar atelectasis. Electronically Signed   By: Aletta Edouard M.D.   On: 07/01/2020 08:03   DG CHEST PORT 1 VIEW  Result Date: 06/28/2020 CLINICAL DATA:  Shortness of breath. EXAM: PORTABLE CHEST 1 VIEW COMPARISON:  06/27/2020. FINDINGS: Mediastinum and heart size normal. Progression of diffuse left lung interstitial infiltrates/edema. Diffuse right lung interstitial infiltrates/edema noted on today's exam. Persistent left-sided pleural effusion without interim change. No pneumothorax. Extensive sclerotic bony metastatic disease again noted. IMPRESSION: 1. Progression of diffuse left lung interstitial infiltrates/edema. Diffuse right lung interstitial infiltrates/edema noted on today's exam. Persistent left-sided pleural effusion without interim change. 2. Extensive sclerotic bony metastatic disease again noted. Electronically Signed   By: [place]   On: 06/28/2020 05:52   DG Chest Portable 1 View  Result Date: 06/24/2020 CLINICAL DATA:  Tachycardia. EXAM: PORTABLE CHEST 1 VIEW COMPARISON:  February 19, 2019 FINDINGS: There is cardiomegaly. There are moderate to large bilateral pleural effusions. There is bibasilar atelectasis. There is diffuse sclerosis throughout the visualized osseous structures most notably the thoracic spine. There is no pneumothorax. IMPRESSION: 1. Moderate to large bilateral pleural effusions with adjacent atelectasis. 2. Cardiomegaly. 3. Progressive sclerosis throughout the visualized osseous structures consistent with metastatic disease. Electronically Signed   By: Constance Holster M.D.   On: 06/24/2020 21:33   US LIVER DOPPLER  Result Date: 06/25/2020 CLINICAL DATA:  64 year old male with a history of liver disease EXAM: DUPLEX ULTRASOUND OF LIVER TECHNIQUE: Color and duplex Doppler ultrasound was  performed to evaluate the hepatic in-flow and out-flow vessels. COMPARISON:  None. FINDINGS: Portal Vein Velocities Main:  43 cm/sec Right:  28 cm/sec Left:  18 cm/sec Hepatic Vein Velocities Right:  77 cm/sec Middle:  78 cm/sec Left:  92 cm/sec Hepatic Artery Velocity:  135 cm/sec Splenic Vein Velocity:  24 cm/sec Varices: Absent Ascites: Absent Spleen volume estimated 52 cubic cm Pleural fluid bilaterally IMPRESSION: Unremarkable duplex of the hepatic vasculature. Bilateral pleural effusions Electronically Signed   By: Corrie Mckusick D.O.   On: 06/25/2020 14:47   ECHOCARDIOGRAM COMPLETE BUBBLE STUDY  Result Date: 06/25/2020    ECHOCARDIOGRAM REPORT   Patient Name:   Chris Morales Date of Exam: 06/25/2020 Medical Rec #:  [id number]    Height:       73.0 in Accession #:    [id number]   Weight:       150.0 lb Date of Birth:  1955-12-04    BSA:          1.904 m Patient Age:    38 years     BP:           111/75 mmHg Patient Gender: M            HR:           126 bpm. Exam Location:  Inpatient Procedure: 2D Echo, Cardiac Doppler, Color Doppler and Saline Contrast Bubble            Study Indications:    Atrial fibrillation with rapid ventricular response ([place])  History:        Patient has no prior history of Echocardiogram examinations.  Sonographer:    Bernadene Person RDCS Referring Phys: [id number] [place]  1. Left ventricular ejection fraction, by estimation, is 50 to  55%. The left ventricle has low normal function. The left ventricle has no regional wall motion abnormalities. Left ventricular diastolic parameters were normal. There is the interventricular septum is flattened in diastole ('D' shaped left ventricle), consistent with right ventricular volume overload.  2. Right ventricular systolic function is mildly reduced. The right ventricular size is mildly enlarged. There is mildly elevated pulmonary artery systolic pressure. The estimated right ventricular systolic pressure is 29.9 mmHg.  3.  Left atrial size was mildly dilated.  4. Right atrial size was mildly dilated.  5. Large pleural effusion in the left lateral region.  6. The mitral valve is normal in structure. Mild mitral valve regurgitation.  7. Tricuspid valve regurgitation is mild to moderate.  8. The aortic valve is normal in structure. Aortic valve regurgitation is not visualized. Mild aortic valve sclerosis is present, with no evidence of aortic valve stenosis.  9. The inferior vena cava is dilated in size with <50% respiratory variability, suggesting right atrial pressure of 15 mmHg. 10. Evidence of atrial level shunting detected by color flow Doppler. Agitated saline contrast bubble study was positive with shunting observed within 3-6 cardiac cycles suggestive of interatrial shunt. Comparison(s): No prior Echocardiogram. Findings are suggestive of right heart volume overload due to left-to-right shunt and there is a small right to left interatrial shunt by saline contrast study. However, an atrial septal defect could not be outlined with 2D and color Doppler imaging. Consider TEE or CT angiography to evaluate for anormalous pulmonary vein return or occult ASD. Qp:Qs shunt fraction could not be accurately calculated due to poor quality RV outflow imaging. FINDINGS  Left Ventricle: Left ventricular ejection fraction, by estimation, is 50 to 55%. The left ventricle has low normal function. The left ventricle has no regional wall motion abnormalities. The left ventricular internal cavity size was normal in size. There is no left ventricular hypertrophy. The interventricular septum is flattened in diastole ('D' shaped left ventricle), consistent with right ventricular volume overload. Left ventricular diastolic parameters were normal. Normal left ventricular filling pressure. Right Ventricle: The right ventricular size is mildly enlarged. No increase in right ventricular wall thickness. Right ventricular systolic function is mildly reduced.  There is mildly elevated pulmonary artery systolic pressure. The tricuspid regurgitant  velocity is 2.60 m/s, and with an assumed right atrial pressure of 15 mmHg, the estimated right ventricular systolic pressure is 37.1 mmHg. Left Atrium: Left atrial size was mildly dilated. Right Atrium: Right atrial size was mildly dilated. Pericardium: There is no evidence of pericardial effusion. Mitral Valve: The mitral valve is normal in structure. Mild mitral valve regurgitation. Tricuspid Valve: The tricuspid valve is grossly normal. Tricuspid valve regurgitation is mild to moderate. Aortic Valve: The aortic valve is normal in structure. Aortic valve regurgitation is not visualized. Mild aortic valve sclerosis is present, with no evidence of aortic valve stenosis. Pulmonic Valve: The pulmonic valve was not well visualized. Pulmonic valve regurgitation is not visualized. Aorta: The aortic root and ascending aorta are structurally normal, with no evidence of dilitation. Venous: The inferior vena cava is dilated in size with less than 50% respiratory variability, suggesting right atrial pressure of 15 mmHg. IAS/Shunts: There is redundancy of the interatrial septum. Evidence of atrial level shunting detected by color flow Doppler. Agitated saline contrast was given intravenously to evaluate for intracardiac shunting. Agitated saline contrast bubble study was  positive with shunting observed within 3-6 cardiac cycles suggestive of interatrial shunt. Additional Comments: There is a large pleural effusion in the  left lateral region.  LEFT VENTRICLE PLAX 2D LVIDd:         4.10 cm     Diastology LVIDs:         3.00 cm     LV e' medial:    13.40 cm/s LV PW:         1.00 cm     LV E/e' medial:  6.5 LV IVS:        0.90 cm     LV e' lateral:   9.00 cm/s LVOT diam:     2.10 cm     LV E/e' lateral: 9.7 LV SV:         68 LV SV Index:   36 LVOT Area:     3.46 cm  LV Volumes (MOD) LV vol d, MOD A2C: 63.8 ml LV vol d, MOD A4C: 58.6 ml LV  vol s, MOD A2C: 32.5 ml LV vol s, MOD A4C: 27.6 ml LV SV MOD A2C:     31.3 ml LV SV MOD A4C:     58.6 ml LV SV MOD BP:      32.7 ml RIGHT VENTRICLE RV S prime:     6.85 cm/s TAPSE (M-mode): 1.7 cm LEFT ATRIUM             Index       RIGHT ATRIUM           Index LA diam:        4.00 cm 2.10 cm/m  RA Area:     21.40 cm LA Vol (A2C):   37.5 ml 19.70 ml/m RA Volume:   61.60 ml  32.35 ml/m LA Vol (A4C):   53.1 ml 27.89 ml/m LA Biplane Vol: 44.8 ml 23.53 ml/m  AORTIC VALVE             PULMONIC VALVE LVOT Vmax:   89.40 cm/s  RVOT Peak grad: 2 mmHg LVOT Vmean:  65.500 cm/s LVOT VTI:    0.197 m  AORTA Ao Root diam: 2.70 cm Ao Asc diam:  2.90 cm MITRAL VALVE               TRICUSPID VALVE MV Area (PHT): 3.77 cm    TR Peak grad:   27.0 mmHg MV Decel Time: 201 msec    TR Vmax:        260.00 cm/s MV E velocity: 87.50 cm/s MV A velocity: 90.80 cm/s  SHUNTS MV E/A ratio:  0.96        Systemic VTI:  0.20 m                            Systemic Diam: 2.10 cm                            Pulmonic VTI:  0.087 m Dani Gobble Croitoru MD Electronically signed by Sanda Klein MD Signature Date/Time: 06/25/2020/12:54:37 PM    Final    IR THORACENTESIS ASP PLEURAL SPACE W/IMG GUIDE  Result Date: 06/28/2020 INDICATION: Patient with a history of prostate cancer with extensive metastases now has new onset pleural effusions. Interventional radiology asked to perform a therapeutic and diagnostic thoracentesis. EXAM: ULTRASOUND GUIDED THORACENTESIS MEDICATIONS: 1% lidocaine 10 mL COMPLICATIONS: None immediate. PROCEDURE: An ultrasound guided thoracentesis was thoroughly discussed with the patient and questions answered. The benefits, risks, alternatives and complications were also discussed. The patient understands and wishes to proceed with the  procedure. Written consent was obtained. Ultrasound was performed to localize and mark an adequate pocket of fluid in the left chest. The area was then prepped and draped in the normal sterile fashion.  1% Lidocaine was used for local anesthesia. Under ultrasound guidance a 6 Fr Safe-T-Centesis catheter was introduced. Thoracentesis was performed. The catheter was removed and a dressing applied. FINDINGS: A total of approximately 1.3 L of bright yellow fluid was removed. Samples were sent to the laboratory as requested by the clinical team. IMPRESSION: Successful ultrasound guided left thoracentesis yielding 1.3 L of pleural fluid. Read by: Soyla Dryer, NP Electronically Signed   By: Corrie Mckusick D.O.   On: 06/28/2020 12:11   IR THORACENTESIS ASP PLEURAL SPACE W/IMG GUIDE  Result Date: 06/27/2020 INDICATION: Patient with a history of prostate cancer with extensive metastases now has new onset pleural effusions. Interventional radiology asked to perform a therapeutic and diagnostic thoracentesis. EXAM: ULTRASOUND GUIDED THORACENTESIS MEDICATIONS: 1% lidocaine 20 mL COMPLICATIONS: None immediate. PROCEDURE: An ultrasound guided thoracentesis was thoroughly discussed with the patient and questions answered. The benefits, risks, alternatives and complications were also discussed. The patient understands and wishes to proceed with the procedure. Written consent was obtained. Ultrasound was performed to localize and mark an adequate pocket of fluid in the right chest. The area was then prepped and draped in the normal sterile fashion. 1% Lidocaine was used for local anesthesia. Under ultrasound guidance a 6 Fr Safe-T-Centesis catheter was introduced. Thoracentesis was performed. The catheter was removed and a dressing applied. FINDINGS: A total of approximately 1.6 L of dark yellow fluid was removed. Samples were sent to the laboratory as requested by the clinical team. IMPRESSION: Successful ultrasound guided right thoracentesis yielding 1.6 L of pleural fluid. Read by: Soyla Dryer, NP Electronically Signed   By: Markus Daft M.D.   On: 06/27/2020 12:29    ASSESSMENT: 64 y.o. Chris Morales man s/p C4-6  laminectomy (Dr Trenton Gammon) 06/22/202 with pathology documenting metastatic prostate cancer             (a) CT abd/pelvis 02/19/2019 finds prostatic enlargement, regional adenopathy, multiple bone lesions and o.7 cm LLL lung nodule             (b) PSA 270.00 02/19/2019             (c) restaging 06/25/2020 chest CT/angio, shows bilateral effusions, extensive bony metastatic disease             (d) brain MRI 10/26/2021shows possible dural based metastases  (1) qualifies for genetics testing  (2) bicalutamide started 06/30/2020             (a) leuprolide started 07/01/2020   PLAN: Chris Morales has tolerated his Lupron injection and Casodex very well.  He is not noticing any side effects related to these medications.  Recommend for him to continue on Casodex.  He is awaiting further plans regarding disposition.   I have discussed with the patient that he has outpatient follow-up with Dr. [name] for ongoing management of his metastatic prostate cancer.  His appointment is scheduled for 07/17/2020 at 2 PM.  The patient's MRI was reviewed with neuro-oncology who thinks his scan likely represents a meningioma.  Recommend repeat scan in about 2 months which will be reviewed in CNS tumor board.   LOS: 7 days   Mikey Bussing, DNP, AGPCNP-BC, AOCNP 07/02/20

## 2020-07-02 NOTE — Progress Notes (Addendum)
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 Pt adm with afib with rvr, acute hypoxic respiratory failure likely secondary to malignant bil pleural effusions. Pt had recently been hospitalized in [place] with afib with rvr and resp failure requiring vent support and then left AMA before flying back to [facility] and presenting immediately to ED. 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 MRI - thickening and enhancement along the posterior falx. New hyperintensity in R parietal lobe that may reflect associated edema.  PMH - prostate CA with bony mets, C3-4 laminectomy with tumor removal 01/2019, afib    PT Comments    Pt seated in recliner on arrival this session.  Performed gt training and LE strengthening.  Noticeable fatigue during activity but remains very motivated.  Continue to recommend rehab in a post acute setting to maximize functional gains before returning home.  Pt is hopeful to return back to work in December.      Follow Up Recommendations  CIR     Equipment Recommendations  Rolling walker with 5" wheels    Recommendations for Other Services       Precautions / Restrictions Precautions Precautions: Fall Precaution Comments: monitor O2 sats Restrictions Weight Bearing Restrictions: No    Mobility  Bed Mobility               General bed mobility comments: Pt seated in recliner on arrival.  Transfers Overall transfer level: Needs assistance Equipment used: Rolling walker (2 wheeled) Transfers: Sit to/from Stand Sit to Stand: Mod assist         General transfer comment: Pt required cues to scoot to edge of recliner, for  foot placement, to push from arm rest and lean forward.  Pt with smoother transition but intial mod assistance to boost into standing.  Ambulation/Gait Ambulation/Gait assistance: Min guard Gait Distance (Feet): 120 Feet (standing rest break after 20 ft for around 30 seconds.) Assistive device: Rolling walker (2 wheeled) Gait Pattern/deviations: Step-through pattern;Wide base of support;Trunk flexed Gait velocity: reduced   General Gait Details: Cues for upper trunk control and scapular retraction.  Pt continues to move with slow gt speed and noticeable fatigue.   Stairs             Wheelchair Mobility    Modified Rankin (Stroke Patients Only)       Balance Overall balance assessment: Needs assistance Sitting-balance support: Feet supported Sitting balance-Leahy Scale: Fair     Standing balance support: During functional activity;Single extremity supported Standing balance-Leahy Scale: Poor Standing balance comment: at least one UE supported during standing ADLs                            Cognition Arousal/Alertness: Awake/alert Behavior During Therapy: WFL for tasks assessed/performed Overall Cognitive Status: Impaired/Different from baseline Area of Impairment: Problem solving                   Current Attention Level: Focused   Following Commands: Follows one step commands consistently     Problem Solving: Slow processing;Requires verbal cues;Requires tactile cues General Comments: Pt required step by step instruction to perform sit to stand transfers.      Exercises  General Exercises - Lower Extremity Quad Sets: AROM;Both;10 reps;Supine Long Arc Quad: AROM;Both;10 reps;Supine Heel Slides: AROM;Both;10 reps;Supine Hip ABduction/ADduction: AROM;Both;10 reps;Supine Hip Flexion/Marching: AROM;Both;10 reps;Supine     General Comments       Pertinent Vitals/Pain Pain Assessment: Faces Faces Pain Scale: Hurts a little bit Pain  Location: legs with mobility; hips Pain Descriptors / Indicators: Grimacing Pain Intervention(s): Monitored during session;Repositioned    Home Living                      Prior Function            PT Goals (current goals can now be found in the care plan section) Acute Rehab PT Goals Patient Stated Goal: return to work in December Potential to Achieve Goals: Fair Progress towards PT goals: Progressing toward goals    Frequency    Min 3X/week      PT Plan Current plan remains appropriate    Co-evaluation              AM-PAC PT "6 Clicks" Mobility   Outcome Measure  Help needed turning from your back to your side while in a flat bed without using bedrails?: None Help needed moving from lying on your back to sitting on the side of a flat bed without using bedrails?: A Little Help needed moving to and from a bed to a chair (including a wheelchair)?: A Little Help needed standing up from a chair using your arms (e.g., wheelchair or bedside chair)?: A Lot Help needed to walk in hospital room?: A Little Help needed climbing 3-5 steps with a railing? : A Lot 6 Click Score: 17    End of Session Equipment Utilized During Treatment: Gait belt;Oxygen Activity Tolerance: Patient tolerated treatment well Patient left: in chair;with call bell/phone within reach;with chair alarm set Nurse Communication: Mobility status PT Visit Diagnosis: Unsteadiness on feet (R26.81);Muscle weakness (generalized) (M62.81)     Time: 2482-5003 PT Time Calculation (min) (ACUTE ONLY): 17 min  Charges:  $Gait Training: 8-22 mins                     Erasmo Leventhal , PTA Acute Rehabilitation Services Pager [phone] Office [phone]     Danielle Mink Eli Hose 07/02/2020, 4:38 PM

## 2020-07-02 NOTE — Progress Notes (Signed)
Inpatient Rehabilitation Admissions Coordinator  I have sent updated clinicals to [facility] and spoken with case manager, Altha Harm. I have updated pt's brother, Dr. Danella Deis also of insurance approval process.  Danne Baxter, RN, MSN Rehab Admissions Coordinator [phone] 07/02/2020 10:46 AM

## 2020-07-02 NOTE — Progress Notes (Signed)
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  Over the past 9 days, he reports progressive shortness of breath that is severe; worse with exertion improved with rest.  Patient recently hospitalized mid [facility] in both [place] from 1017-06/24/2020.  During the hospitalization, patient was found to be in atrial fibrillation with RVR and respiratory failure requiring admission to the intensive care unit and ventilatory support.  Patient was tried on multiple AV nodal blocking agents including amiodarone, digoxin and finally was placed on scheduled metoprolol therapy.  Attempts were made at chemical cardioversion and failed.  Patient was eventually extubated and Chris Morales lies he was suffering from left-sided upper extremity weakness and decided to leave AMA on 06/24/2020 and he took a flight from [place] to [place] where his brother lives.  Immediately after landing, patient proceeded to the [name] for evaluation.  In the ED, patient noted to be in A. fib with RVR with chest x-ray revealing substantial bilateral pulmonary effusions with noted metastatic lesions to the bones.  Patient was found to have an elevated D-dimer 19.33.  Hospitalist service consulted for admission for further evaluation and treatment of severe shortness of breath, respiratory failure requiring oxygen therapy and A. fib with RVR.   Assessment & Plan:   Principal Problem:   Atrial fibrillation with rapid ventricular response (HCC) Active Problems:   Malignant neoplasm of prostate metastatic to bone [facility])   Euthyroid sick  syndrome   Left-sided weakness   Foley catheter in place on admission   Pleural effusion, bilateral   Normocytic anemia   Elevated d-dimer   Nutmeg liver   Palliative care by specialist   Goals of care, counseling/discussion   Encounter for hospice care discussion   Full code status   Pleural effusion   SOB (shortness of breath)   Status post thoracentesis   Atrial fibrillation with RVR Patient presenting with persistent A. fib with RVR.  Recent extensive hospitalization in [place] with difficult to control A. fib despite multiple AV nodal blocking agents including amiodarone, digoxin, failed chemical cardioversion.  On presentation, patient was once again in A. fib with RVR and started on a Cardizem drip; now which is titrated off. --Continue metoprolol succinate 100 mg p.o. BID --Digoxin 0.25 mg p.o. daily --Started Cardizem 30 mg p.o. q8h 11/1 --Continue to hold home Eliquis --Continue to monitor on telemetry  Acute hypoxic respiratory failure 2/2 to decompensated diastolic congestive heart failure with likely exudative bilateral pleural effusions likely malignant Patient presenting with progressive shortness of breath, slightly hypoxic on admission requiring 2 L nasal cannula.  Recent hospitalization requiring ventilatory support.  Chest x-ray notable for substantial bilateral pleural effusions, suspect likely malignant etiology.  CT angiogram chest negative for pulmonary embolism.  No signs of infectious etiology.  TTE 06/25/2020 with LVEF 50-55%, no LV wall motion abnormalities, LA mildly dilated, RA mildly dilated, large pleural effusion on left, moderate TR, mild MR, IVC dilated. Underwent IR ultrasound-guided right thoracentesis on 06/27/2020 with 1.6 L dark yellow fluid removed allergy with reactive mesothelial cells present and ultrasound-guided left thoracentesis on 06/28/2020 with 1.3 L bright yellow fluid removed. --Right Pleural  fluid consistent with exudative effusion,  culture shows no growth x 5 days --Left pleural fluid culture no growth x 4 days --repeat CXR 11/1 shows no significant recurrence of pleural effusion --weaned off supplemental oxygen --Restart home Eliquis today  Left-sided weakness Patient reports new left upper extremity weakness.  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  Case was discussed with neurology by previous hospitalist, unlikely dural based met versus meningioma as likely cause, and recommended further imaging with MRI C-spine.  Patient unable to tolerate MR C-spine today once again. --PT/OT recommends CIR --Pending CIR evaluation/insurance authorization; if unable will likely dc home with home health  Prostate cancer with bony metastasis Diagnosed with prostate cancer June 2020, but has yet to receive treatment.  Imaging studies during hospitalization notable for extensive osseous metastatic disease.  Patient desires to establish care with [facility]; and after discussion with patient's brother, he requests oncology evaluation inpatient as patient would benefit from more local care.  Patient initially hesitant for evaluation here by oncology, but now would like to proceed with consult. --Evaluated by Dr. Jana Hakim on 06/30/2020; started on treatment with Lupron 7.5 mg IM injection x1 with Casodex 50 mg p.o. daily. --MR Brain reviewed by Neuro-oncology, Dr. Mickeal Skinner; recommend repeat MR Brain 2 months  --Outpatient follow-up arranged with Dr. [name] on 07/17/2020 --Palliative care following for poor prognosis, plan to continue to follow outpatient  Hypothyroidism versus sick euthyroid syndrome Patient with mild elevation of TSH, started on Synthroid in [place]. --TSH 7.345 --Continue levothyroxine 25 mcg p.o. 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  Iron 46, TIBC 141 (low), ferritin 590 (high); consistent with anemia of chronic disease, likely secondary to cancer/metastases. --Hemoglobin 9.0>7.4>7.2>8.3>8.0 --Iron 55, TIBC 192 (low), ferritin 1549 (high), folate 6.6, B12 633. --Transfused 1 unit PRBC 10/30 --Continue to monitor hemoglobin daily --Transfuse for hemoglobin less than 7.0  Elevated D-dimer D-dimer elevated 19.33.  CT angiogram chest negative for pulmonary embolism.  Records from [place] facility notes CT abdomen/pelvis revealing nutmeg liver concerning for portal vein thrombosis.  Ultrasound Doppler liver unremarkable.  Etiology of his elevated D-dimer likely secondary to his underlying malignancy.   DVT prophylaxis: SCDs, restart home Eliquis today Code Status: Full code Family Communication: No family present at bedside; updated patient's brother, Dr. Chrissie Noa via telephone this afternoon  Disposition Plan:  Status is: Inpatient  Remains inpatient appropriate because:Ongoing active pain requiring inpatient pain management, Ongoing diagnostic testing needed not appropriate for outpatient work up, Unsafe d/c plan, IV treatments appropriate due to intensity of illness or inability to take PO and Inpatient level of care appropriate due to severity of illness   Dispo: The patient is from: Home              Anticipated d/c is to: CIR vs home with [facility] and palliative to follow outpatient              Anticipated d/c date is: 2 days              Patient currently is not medically stable to d/c.   Consultants:   Palliative care  Interventional radiology  Medical oncology  Procedures:   None  Antimicrobials:   None   Subjective: Patient seen and examined bedside, sitting in bedside chair.  Continues with weakness and fatigue.  Awaiting CIR  insurance authorization.  Outpatient follow-up scheduled with oncology with Dr. [name] and plans for repeat MRI in 2  months per Dr. Mickeal Skinner.  No other complaints or concerns at this time. Denies fever/chills/night sweats, no nausea/vomiting/diarrhea, no chest pain, no palpitations, no shortness of breath, no abdominal pain, no cough/congestion.  No acute events overnight per nursing staff.  Objective: Vitals:   07/02/20 0535 07/02/20 0745 07/02/20 0931 07/02/20 1136  BP:  109/77  98/70  Pulse:   65   Resp:  19  18  Temp:  97.8 F (36.6 C)  98.3 F (36.8 C)  TempSrc:  Oral  Oral  SpO2:  100%  98%  Weight: 69.2 kg     Height:        Intake/Output Summary (Last 24 hours) at 07/02/2020 1229 Last data filed at 07/02/2020 0500 Gross per 24 hour  Intake 180 ml  Output 575 ml  Net -395 ml   Filed Weights   06/30/20 0505 07/01/20 0537 07/02/20 0535  Weight: 69.2 kg 70 kg 69.2 kg    Examination:  General exam: Appears calm and comfortable, thin/cachectic in appearance Respiratory system: Breath sounds slightly decreased bilateral bases, no wheezing, no rhonchi, normal respiratory effort, oxygenating well on room air Cardiovascular system: S1 & S2 heard, irregularly irregular rhythm, normal rate. No JVD, murmurs, rubs, gallops or clicks.  Trace to 1+ pitting edema bilaterally Gastrointestinal system: Abdomen is nondistended, soft and nontender. No organomegaly or masses felt. Normal bowel sounds heard. Central nervous system: Alert and oriented. No focal neurological deficits. Extremities: Symmetric 5 x 5 power. Skin: No rashes, lesions or ulcers Psychiatry: Judgement and insight appear normal. Mood & affect appropriate.     Data Reviewed: I have personally reviewed following labs and imaging studies  CBC: Recent Labs  Lab 06/28/20 0202 06/29/20 0408 06/30/20 0448 07/02/20 0225  WBC 7.4 8.3 7.5 6.2  HGB 7.4* 7.2* 8.3* 8.0*  HCT 25.0* 24.7* 27.4* 27.3*  MCV 91.9 93.9 92.6 93.5  PLT 245 253 275 116   Basic Metabolic Panel: Recent Labs  Lab 06/25/20 1546 06/28/20 0202 06/29/20 0408  06/30/20 0448 07/02/20 0225  NA  --  139 139 141 139  K  --  4.0 4.1 4.0 3.8  CL  --  108 109 112* 108  CO2  --  23 21* 22 23  GLUCOSE  --  108* 87 94 114*  BUN  --  _0 CREATININE  --  0.68 0.62 0.53* 0.59*  CALCIUM  --  7.3* 7.3* 7.4* 7.7*  MG 2.0 1.9 1.9 1.9 2.1   GFR: Estimated Creatinine Clearance: 91.3 mL/min (A) (by C-G formula based on SCr of 0.59 mg/dL (L)). Liver Function Tests: Recent Labs  Lab 06/27/20 1520  PROT 5.3*   No results for input(s): LIPASE, AMYLASE in the last 168 hours. No results for input(s): AMMONIA in the last 168 hours. Coagulation Profile: No results for input(s): INR, PROTIME in the last 168 hours. Cardiac Enzymes: No results for input(s): CKTOTAL, CKMB, CKMBINDEX, TROPONINI in the last 168 hours. BNP (last 3 results) No results for input(s): PROBNP in the last 8760 hours. HbA1C: No results for input(s): HGBA1C in the last 72 hours. CBG: Recent Labs  Lab 07/01/20 1149  GLUCAP 118*   Lipid Profile: No results for input(s): CHOL, HDL, LDLCALC, TRIG, CHOLHDL, LDLDIRECT in the last 72 hours. Thyroid Function Tests: No results for input(s): TSH, T4TOTAL, FREET4, T3FREE, THYROIDAB in the last 72 hours. Anemia  Panel: No results for input(s): VITAMINB12, FOLATE, FERRITIN, TIBC, IRON, RETICCTPCT in the last 72 hours. Sepsis Labs: Recent Labs  Lab 06/25/20 1933 06/26/20 0735 06/26/20 1038 06/27/20 0202  PROCALCITON 0.43  --   --   --   LATICACIDVEN  --  1.7 2.6* 1.2    Recent Results (from the past 240 hour(s))  Urine culture     Status: None   Collection Time: 06/24/20  9:57 PM   Specimen: Urine, Catheterized  Result Value Ref Range Status   Specimen Description URINE, CATHETERIZED  Final   Special Requests NONE  Final   Culture   Final    NO GROWTH Performed at [facility], [place] [address] [place], [place] [address]    Report Status 06/26/2020 FINAL  Final  Respiratory Panel by RT PCR (Flu A&B, Covid) -  Nasopharyngeal Swab     Status: None   Collection Time: 06/25/20  4:00 PM   Specimen: Nasopharyngeal Swab  Result Value Ref Range Status   SARS Coronavirus 2 by RT PCR NEGATIVE NEGATIVE Final    Comment: (NOTE) SARS-CoV-2 target nucleic acids are NOT DETECTED.  The SARS-CoV-2 RNA is generally detectable in upper respiratoy specimens during the acute phase of infection. The lowest concentration of SARS-CoV-2 viral copies this assay can detect is 131 copies/mL. A negative result does not preclude SARS-Cov-2 infection and should not be used as the sole basis for treatment or other patient management decisions. A negative result may occur with  improper specimen collection/handling, submission of specimen other than nasopharyngeal swab, presence of viral mutation(s) within the areas targeted by this assay, and inadequate number of viral copies (<131 copies/mL). A negative result must be combined with clinical observations, patient history, and epidemiological information. The expected result is Negative.  Fact Sheet for Patients:  PinkCheek.be  Fact Sheet for Healthcare Providers:  GravelBags.it  This test is no t yet approved or cleared by the [place] FDA and  has been authorized for detection and/or diagnosis of SARS-CoV-2 by FDA under an Emergency Use Authorization (EUA). This EUA will remain  in effect (meaning this test can be used) for the duration of the COVID-19 declaration under Section 564(b)(1) of the Act, 21 U.S.C. section 360bbb-3(b)(1), unless the authorization is terminated or revoked sooner.     Influenza A by PCR NEGATIVE NEGATIVE Final   Influenza B by PCR NEGATIVE NEGATIVE Final    Comment: (NOTE) The Xpert Xpress SARS-CoV-2/FLU/RSV assay is intended as an aid in  the diagnosis of influenza from Nasopharyngeal swab specimens and  should not be used as a sole basis for treatment. Nasal washings and   aspirates are unacceptable for Xpert Xpress SARS-CoV-2/FLU/RSV  testing.  Fact Sheet for Patients: PinkCheek.be  Fact Sheet for Healthcare Providers: GravelBags.it  This test is not yet approved or cleared by the [place] FDA and  has been authorized for detection and/or diagnosis of SARS-CoV-2 by  FDA under an Emergency Use Authorization (EUA). This EUA will remain  in effect (meaning this test can be used) for the duration of the  Covid-19 declaration under Section 564(b)(1) of the Act, 21  U.S.C. section 360bbb-3(b)(1), unless the authorization is  terminated or revoked. Performed at [facility], [place] [address], [place], [place] [address]   SARS CORONAVIRUS 2 (TAT 6-24 HRS) Nasopharyngeal Nasopharyngeal Swab     Status: None   Collection Time: 06/26/20 11:40 AM   Specimen: Nasopharyngeal Swab  Result Value Ref Range Status  SARS Coronavirus 2 NEGATIVE NEGATIVE Final    Comment: (NOTE) SARS-CoV-2 target nucleic acids are NOT DETECTED.  The SARS-CoV-2 RNA is generally detectable in upper and lower respiratory specimens during the acute phase of infection. Negative results do not preclude SARS-CoV-2 infection, do not rule out co-infections with other pathogens, and should not be used as the sole basis for treatment or other patient management decisions. Negative results must be combined with clinical observations, patient history, and epidemiological information. The expected result is Negative.  Fact Sheet for Patients: SugarRoll.be  Fact Sheet for Healthcare Providers: https://www.woods-mathews.com/  This test is not yet approved or cleared by the [place] FDA and  has been authorized for detection and/or diagnosis of SARS-CoV-2 by FDA under an Emergency Use Authorization (EUA). This EUA will remain  in effect (meaning this test can be used) for the  duration of the COVID-19 declaration under Se ction 564(b)(1) of the Act, 21 U.S.C. section 360bbb-3(b)(1), unless the authorization is terminated or revoked sooner.  Performed at [facility], [place] [address], [place], [place] [address]   Gram stain     Status: None   Collection Time: 06/27/20 11:40 AM   Specimen: PATH Cytology Pleural fluid  Result Value Ref Range Status   Specimen Description PLEURAL FLUID  Final   Special Requests NONE  Final   Gram Stain   Final    WBC PRESENT,BOTH PMN AND MONONUCLEAR NO ORGANISMS SEEN CYTOSPIN SMEAR Performed at [facility], [place] [address] [place], [place] [address]    Report Status 06/27/2020 FINAL  Final  Culture, body fluid-bottle     Status: None   Collection Time: 06/27/20 11:40 AM   Specimen: Pleura  Result Value Ref Range Status   Specimen Description PLEURAL FLUID  Final   Special Requests NONE  Final   Culture   Final    NO GROWTH 5 DAYS Performed at [place] [address] [place], [place] [address]    Report Status 07/02/2020 FINAL  Final  Culture, body fluid-bottle     Status: None (Preliminary result)   Collection Time: 06/28/20 10:53 AM   Specimen: Pleura  Result Value Ref Range Status   Specimen Description PLEURAL FLUID  Final   Special Requests LEFT LUNG  Final   Culture   Final    NO GROWTH 4 DAYS Performed at [place] [address], [place], [place] [address]    Report Status PENDING  Incomplete  Gram stain     Status: None   Collection Time: 06/28/20 10:53 AM   Specimen: Pleura  Result Value Ref Range Status   Specimen Description PLEURAL FLUID  Final   Special Requests LEFT LUNG  Final   Gram Stain   Final    WBC PRESENT,BOTH PMN AND MONONUCLEAR NO ORGANISMS SEEN CYTOSPIN SMEAR Performed at [facility], [address] [place], [place] [address]    Report Status 06/28/2020 FINAL  Final         Radiology Studies: DG CHEST PORT 1 VIEW  Result Date:  07/01/2020 CLINICAL DATA:  Shortness of breath, metastatic prostate carcinoma status post left thoracentesis on 06/28/2020. EXAM: PORTABLE CHEST 1 VIEW COMPARISON:  06/28/2020 FINDINGS: The heart size and mediastinal contours are within normal limits. No significant recurrence of left pleural fluid since thoracentesis with probable small pleural effusion remaining. No pneumothorax. Stable bibasilar atelectasis. Stable diffuse sclerotic metastatic disease involving the thoracic spine. IMPRESSION: No significant recurrence  of left pleural fluid since thoracentesis with probable small pleural effusion remaining. Stable bibasilar atelectasis. Electronically Signed   By: Aletta Edouard M.D.   On: 07/01/2020 08:03        Scheduled Meds: . bicalutamide  50 mg Oral Daily  . Chlorhexidine Gluconate Cloth  6 each Topical Daily  . digoxin  0.25 mg Oral Daily  . diltiazem  30 mg Oral Q8H  . levothyroxine  25 mcg Oral QAC breakfast  . metoprolol succinate  100 mg Oral BID   Continuous Infusions:   LOS: 7 days    Time spent: 36 minutes spent on chart review, discussion with nursing staff, consultants, updating family and interview/physical exam; more than 50% of that time was spent in counseling and/or coordination of care.    Damany Eastman J [place] DO [facility] Available via Epic secure chat 7am-7pm After these hours, please refer to coverage provider listed on [email or web address] 07/02/2020, 12:29 PM

## 2020-07-02 NOTE — Discharge Instructions (Addendum)
Information on my medicine - ELIQUIS (apixaban)  Why was Eliquis prescribed for you? Eliquis was prescribed for you to reduce the risk of a blood clot forming that can cause a stroke if you have a medical condition called atrial fibrillation (a type of irregular heartbeat).  What do You need to know about Eliquis ? Take your Eliquis TWICE DAILY - one tablet in the morning and one tablet in the evening with or without food. If you have difficulty swallowing the tablet whole please discuss with your pharmacist how to take the medication safely.  Take Eliquis exactly as prescribed by your doctor and DO NOT stop taking Eliquis without talking to the doctor who prescribed the medication.  Stopping may increase your risk of developing a stroke.  Refill your prescription before you run out.  After discharge, you should have regular check-up appointments with your healthcare provider that is prescribing your Eliquis.  In the future your dose may need to be changed if your kidney function or weight changes by a significant amount or as you get older.  What do you do if you miss a dose? If you miss a dose, take it as soon as you remember on the same day and resume taking twice daily.  Do not take more than one dose of ELIQUIS at the same time to make up a missed dose.  Important Safety Information A possible side effect of Eliquis is bleeding. You should call your healthcare provider right away if you experience any of the following: ? Bleeding from an injury or your nose that does not stop. ? Unusual colored urine (red or dark brown) or unusual colored stools (red or black). ? Unusual bruising for unknown reasons. ? A serious fall or if you hit your head (even if there is no bleeding).  Some medicines may interact with Eliquis and might increase your risk of bleeding or clotting while on Eliquis. To help avoid this, consult your healthcare provider or pharmacist prior to using any new  prescription or non-prescription medications, including herbals, vitamins, non-steroidal anti-inflammatory drugs (NSAIDs) and supplements.  This website has more information on Eliquis (apixaban): http://www.eliquis.com/eliquis/home   Atrial Fibrillation  Atrial fibrillation is a type of heartbeat that is irregular or fast. If you have this condition, your heart beats without any order. This makes it hard for your heart to pump blood in a normal way. Atrial fibrillation may come and go, or it may become a long-lasting problem. If this condition is not treated, it can put you at higher risk for stroke, heart failure, and other heart problems. What are the causes? This condition may be caused by diseases that damage the heart. They include:  High blood pressure.  Heart failure.  Heart valve disease.  Heart surgery. Other causes include:  Diabetes.  Thyroid disease.  Being overweight.  Kidney disease. Sometimes the cause is not known. What increases the risk? You are more likely to develop this condition if:  You are older.  You smoke.  You exercise often and very hard.  You have a family history of this condition.  You are a man.  You use drugs.  You drink a lot of alcohol.  You have lung conditions, such as emphysema, pneumonia, or COPD.  You have sleep apnea. What are the signs or symptoms? Common symptoms of this condition include:  A feeling that your heart is beating very fast.  Chest pain or discomfort.  Feeling short of breath.  Suddenly feeling light-headed  or weak.  Getting tired easily during activity.  Fainting.  Sweating. In some cases, there are no symptoms. How is this treated? Treatment for this condition depends on underlying conditions and how you feel when you have atrial fibrillation. They include:  Medicines to: ? Prevent blood clots. ? Treat heart rate or heart rhythm problems.  Using devices, such as a pacemaker, to correct  heart rhythm problems.  Doing surgery to remove the part of the heart that sends bad signals.  Closing an area where clots can form in the heart (left atrial appendage). In some cases, your doctor will treat other underlying conditions. Follow these instructions at home: Medicines  Take over-the-counter and prescription medicines only as told by your doctor.  Do not take any new medicines without first talking to your doctor.  If you are taking blood thinners: ? Talk with your doctor before you take any medicines that have aspirin or NSAIDs, such as ibuprofen, in them. ? Take your medicine exactly as told by your doctor. Take it at the same time each day. ? Avoid activities that could hurt or bruise you. Follow instructions about how to prevent falls. ? Wear a bracelet that says you are taking blood thinners. Or, carry a card that lists what medicines you take. Lifestyle      Do not use any products that have nicotine or tobacco in them. These include cigarettes, e-cigarettes, and chewing tobacco. If you need help quitting, ask your doctor.  Eat heart-healthy foods. Talk with your doctor about the right eating plan for you.  Exercise regularly as told by your doctor.  Do not drink alcohol.  Lose weight if you are overweight.  Do not use drugs, including cannabis. General instructions  If you have a condition that causes breathing to stop for a short period of time (apnea), treat it as told by your doctor.  Keep a healthy weight. Do not use diet pills unless your doctor says they are safe for you. Diet pills may make heart problems worse.  Keep all follow-up visits as told by your doctor. This is important. Contact a doctor if:  You notice a change in the speed, rhythm, or strength of your heartbeat.  You are taking a blood-thinning medicine and you get more bruising.  You get tired more easily when you move or exercise.  You have a sudden change in weight. Get help  right away if:   You have pain in your chest or your belly (abdomen).  You have trouble breathing.  You have side effects of blood thinners, such as blood in your vomit, poop (stool), or pee (urine), or bleeding that cannot stop.  You have any signs of a stroke. "BE FAST" is an easy way to remember the main warning signs: ? B - Balance. Signs are dizziness, sudden trouble walking, or loss of balance. ? E - Eyes. Signs are trouble seeing or a change in how you see. ? F - Face. Signs are sudden weakness or loss of feeling in the face, or the face or eyelid drooping on one side. ? A - Arms. Signs are weakness or loss of feeling in an arm. This happens suddenly and usually on one side of the body. ? S - Speech. Signs are sudden trouble speaking, slurred speech, or trouble understanding what people say. ? T - Time. Time to call emergency services. Write down what time symptoms started.  You have other signs of a stroke, such as: ? A  sudden, very bad headache with no known cause. ? Feeling like you may vomit (nausea). ? Vomiting. ? A seizure. These symptoms may be an emergency. Do not wait to see if the symptoms will go away. Get medical help right away. Call your local emergency services (911 in the [place] Do not drive yourself to the hospital. Summary  Atrial fibrillation is a type of heartbeat that is irregular or fast.  You are at higher risk of this condition if you smoke, are older, have diabetes, or are overweight.  Follow your doctor's instructions about medicines, diet, exercise, and follow-up visits.  Get help right away if you have signs or symptoms of a stroke.  Get help right away if you cannot catch your breath, or you have chest pain or discomfort. This information is not intended to replace advice given to you by your health care provider. Make sure you discuss any questions you have with your health care provider. Document Revised: 02/08/2019 Document Reviewed:  02/08/2019 Elsevier Patient Education  [place].  Stroke Prevention Some medical conditions and lifestyle choices can lead to a higher risk for a stroke. You can help to prevent a stroke by making nutrition, lifestyle, and other changes. What nutrition changes can be made?   Eat healthy foods. ? Choose foods that are high in fiber. These include:  Fresh fruits.  Fresh vegetables.  Whole grains. ? Eat at least 5 or more servings of fruits and vegetables each day. Try to fill half of your plate at each meal with fruits and vegetables. ? Choose lean protein foods. These include:  Lowfat (lean) cuts of meat.  Chicken without skin.  Fish.  Tofu.  Beans.  Nuts. ? Eat low-fat dairy products. ? Avoid foods that:  Are high in salt (sodium).  Have saturated fat.  Have trans fat.  Have cholesterol.  Are processed.  Are premade.  Follow eating guidelines as told by your doctor. These may include: ? Reducing how many calories you eat and drink each day. ? Limiting how much salt you eat or drink each day to 1,500 milligrams (mg). ? Using only healthy fats for cooking. These include:  Olive oil.  Canola oil.  Sunflower oil. ? Counting how many carbohydrates you eat and drink each day. What lifestyle changes can be made?  Try to stay at a healthy weight. Talk to your doctor about what a good weight is for you.  Get at least 30 minutes of moderate physical activity at least 5 days a week. This can include: ? Fast walking. ? Biking. ? Swimming.  Do not use any products that have nicotine or tobacco. This includes cigarettes and e-cigarettes. If you need help quitting, ask your doctor. Avoid being around tobacco smoke in general.  Limit how much alcohol you drink to no more than 1 drink a day for nonpregnant women and 2 drinks a day for men. One drink equals 12 oz of beer, 5 oz of wine, or 1 oz of hard liquor.  Do not use drugs.  Avoid taking birth  control pills. Talk to your doctor about the risks of taking birth control pills if: ? You are over 65 years old. ? You smoke. ? You get migraines. ? You have had a blood clot. What other changes can be made?  Manage your cholesterol. ? It is important to eat a healthy diet. ? If your cholesterol cannot be managed through your diet, you may also need to take medicines. Take  medicines as told by your doctor.  Manage your diabetes. ? It is important to eat a healthy diet and to exercise regularly. ? If your blood sugar cannot be managed through diet and exercise, you may need to take medicines. Take medicines as told by your doctor.  Control your high blood pressure (hypertension). ? Try to keep your blood pressure below 130/80. This can help lower your risk of stroke. ? It is important to eat a healthy diet and to exercise regularly. ? If your blood pressure cannot be managed through diet and exercise, you may need to take medicines. Take medicines as told by your doctor. ? Ask your doctor if you should check your blood pressure at home. ? Have your blood pressure checked every year. Do this even if your blood pressure is normal.  Talk to your doctor about getting checked for a sleep disorder. Signs of this can include: ? Snoring a lot. ? Feeling very tired.  Take over-the-counter and prescription medicines only as told by your doctor. These may include aspirin or blood thinners (antiplatelets or anticoagulants).  Make sure that any other medical conditions you have are managed. Where to find more information  American Stroke Association: www.strokeassociation.org  National Stroke Association: www.stroke.org Get help right away if:  You have any symptoms of stroke. "BE FAST" is an easy way to remember the main warning signs: ? B - Balance. Signs are dizziness, sudden trouble walking, or loss of balance. ? E - Eyes. Signs are trouble seeing or a sudden change in how you see. ? F  - Face. Signs are sudden weakness or loss of feeling of the face, or the face or eyelid drooping on one side. ? A - Arms. Signs are weakness or loss of feeling in an arm. This happens suddenly and usually on one side of the body. ? S - Speech. Signs are sudden trouble speaking, slurred speech, or trouble understanding what people say. ? T - Time. Time to call emergency services. Write down what time symptoms started.  You have other signs of stroke, such as: ? A sudden, very bad headache with no known cause. ? Feeling sick to your stomach (nausea). ? Throwing up (vomiting). ? Jerky movements you cannot control (seizure). These symptoms may represent a serious problem that is an emergency. Do not wait to see if the symptoms will go away. Get medical help right away. Call your local emergency services (911 in the [place] Do not drive yourself to the hospital. Summary  You can prevent a stroke by eating healthy, exercising, not smoking, drinking less alcohol, and treating other health problems, such as diabetes, high blood pressure, or high cholesterol.  Do not use any products that contain nicotine or tobacco, such as cigarettes and e-cigarettes.  Get help right away if you have any signs or symptoms of a stroke. This information is not intended to replace advice given to you by your health care provider. Make sure you discuss any questions you have with your health care provider. Document Revised: 10/13/2018 Document Reviewed: 11/18/2016 Elsevier Patient Education  [place].

## 2020-07-02 NOTE — Progress Notes (Signed)
Manufacturing engineer [facility])  During conversation with Mr Dowe (brother) family has decided to decline Palliative services at this time. However, when the time comes to move forward with Palliative or Hospice they would be glad to initiate services then.   Thank you for the opportunity to help with discharge planning. Feel free to call [facility] for any Hospice or Palliative related questions.  Clementeen Hoof, BSN, [facility] (in [place]) [phone]

## 2020-07-03 DIAGNOSIS — I4891 Unspecified atrial fibrillation: Secondary | ICD-10-CM | POA: Diagnosis not present

## 2020-07-03 LAB — BASIC METABOLIC PANEL
Anion gap: 10 (ref 5–15)
BUN: 15 mg/dL (ref 8–23)
CO2: 22 mmol/L (ref 22–32)
Calcium: 7.9 mg/dL — ABNORMAL LOW (ref 8.9–10.3)
Chloride: 109 mmol/L (ref 98–111)
Creatinine, Ser: 0.51 mg/dL — ABNORMAL LOW (ref 0.61–1.24)
GFR, Estimated: >60 mL/min (ref >60)
Glucose, Bld: 86 mg/dL (ref 70–99)
Potassium: 4 mmol/L (ref 3.5–5.1)
Sodium: 141 mmol/L (ref 135–145)

## 2020-07-03 LAB — MAGNESIUM: Magnesium: 2.1 mg/dL (ref 1.7–2.4)

## 2020-07-03 LAB — CBC
HCT: 28.4 % — ABNORMAL LOW (ref 39.0–52.0)
Hemoglobin: 8.4 g/dL — ABNORMAL LOW (ref 13.0–17.0)
MCH: 28 pg (ref 26.0–34.0)
MCHC: 29.6 g/dL — ABNORMAL LOW (ref 30.0–36.0)
MCV: 94.7 fL (ref 80.0–100.0)
Platelets: 295 10*3/uL (ref 150–400)
RBC: 3 MIL/uL — ABNORMAL LOW (ref 4.22–5.81)
RDW: 21.9 % — ABNORMAL HIGH (ref 11.5–15.5)
WBC: 5.4 10*3/uL (ref 4.0–10.5)
nRBC: 4.5 % — ABNORMAL HIGH (ref 0.0–0.2)

## 2020-07-03 LAB — CULTURE, BODY FLUID W GRAM STAIN -BOTTLE: Culture: NO GROWTH

## 2020-07-03 NOTE — H&P (Signed)
Physical Medicine and Rehabilitation Admission H&P    Chief Complaint  Patient presents with  . 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  He does have family in the [place] area who assist as needed.  Patient with complicated course he was recently hospitalized at [facility] in [place] from 10/17-10/25 for shortness of breath and atrial fibrillation he did require intubation for a short time.  Attempts were made at chemical cardioversion that failed and was placed on Eliquis.  Reportedly after patient was extubated he was having some left side weakness particularly left upper extremity.  He was frustrated as to the delay in getting an MRI of his brain decided to leave AMA early morning of 06/24/2020 and return to the [place] area to be closer to his brother who is a retired Software engineer and patient immediately after landing in the [place] area was brought to [facility] for ongoing evaluation.  In the ED he was found to still be in rapid atrial fibrillation.  Chest x-ray showed substantial bilateral pleural effusions with evidence of diffuse metastatic lesions in the bones.  CT angiogram of the chest negative for pulmonary emboli.  He had a markedly elevated D-dimer of 19.33.  MRI of the brain showed no acute infarction however there was some thickening enhancement along the posterior falx cerebri with possible minimal associated parenchymal edema.  Attempts were made at MR C-spine but patient was unable to tolerate.  Admission chemistries glucose 153, calcium 7.5, alkaline phosphatase 2755, BNP  281, urine culture no growth, digoxin level 1.0, troponin negative, hemoglobin 9.0, WBC 8.4.  Patient currently remains on Lanoxin as well as Cardizem 30 mg every 8 hours with Eliquis ongoing as well as Toprol-XL 100 mg twice daily.  Echocardiogram with ejection fraction of 50 to 55% no wall motion abnormalities.  Patient did undergo IR ultrasound-guided right thoracentesis on 06/27/2020 with 1.6 L dark yellow fluid removed and left thoracentesis 06/28/2020 with 1.3 L bright yellow fluid removed and cultures continue to show no growth.  Latest chest x-ray 07/01/2020 showed no significant recurrence of left pleural fluid since thoracentesis stable bibasilar atelectasis.  Oncology services Dr. Jana Hakim have been consulted in regards to prostate cancer and remains on Casodex and current plans to follow-up outpatient with Dr. [name] as well as palliative care established with plan to repeat MRI of the brain in 2 months per Dr. Mickeal Skinner.  A Foley catheter tube currently remains in place for urinary retention awaiting plan for voiding trial.  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  Multilevel neural foraminal narrowing greatest on the left at C3-4 moderate to severe and C4-5 moderate.  Therapy evaluations completed and patient was admitted for a comprehensive rehab program.  Review of Systems  Constitutional: Negative for chills and fever.  HENT: Negative for hearing loss.   Eyes: Negative for blurred vision and double vision.  Respiratory: Positive for shortness of breath.   Cardiovascular: Positive for palpitations and leg swelling. Negative  for chest pain.  Gastrointestinal: Positive for constipation. Negative for heartburn, nausea and vomiting.  Genitourinary: Positive for urgency. Negative for dysuria, flank pain  and hematuria.  Musculoskeletal: Positive for myalgias.  Skin: Negative for rash.  All other systems reviewed and are negative.  Past Medical History:  Diagnosis Date  . Prostate CA [facility])    Past Surgical History:  Procedure Laterality Date  . IR THORACENTESIS ASP PLEURAL SPACE W/IMG GUIDE  06/27/2020  . IR THORACENTESIS ASP PLEURAL SPACE W/IMG GUIDE  06/28/2020  . LAMINECTOMY Bilateral 02/20/2019   Procedure: CERVICAL LAMINECTOMY FOR TUMOR;  Surgeon: Earnie Larsson, MD;  Location: [place];  Service: Neurosurgery;  Laterality: Bilateral;  CERVICAL LAMINECTOMY FOR TUMOR  . LIGAMENT REPAIR Left    Family History  Problem Relation Age of Onset  . Prostate cancer Brother    Social History:  reports that he has never smoked. He has never used smokeless tobacco. He reports current alcohol use. He reports that he does not use drugs. Allergies: No Known Allergies Medications Prior to Admission  Medication Sig Dispense Refill  . apixaban (ELIQUIS) 5 MG TABS tablet Take 5 mg by mouth 2 (two) times daily.    . bicalutamide (CASODEX) 50 MG tablet Take 50 mg by mouth daily.    . digoxin (LANOXIN) 0.25 MG tablet Take 0.25 mg by mouth daily.    Marland Kitchen docusate sodium (COLACE) 100 MG capsule Take 100 mg by mouth 2 (two) times daily as needed (constipation).    Marland Kitchen levothyroxine (SYNTHROID) 25 MCG tablet Take 25 mcg by mouth daily before breakfast.    . metoprolol succinate (TOPROL-XL) 50 MG 24 hr tablet Take 100 mg by mouth in the morning and at bedtime. Take with or immediately following a meal.    . pantoprazole (PROTONIX) 40 MG tablet Take 40 mg by mouth daily.    Marland Kitchen senna (SENOKOT) 8.6 MG TABS tablet Take 1 tablet by mouth 2 (two) times daily as needed (constipation).    . folic acid (FOLVITE) 1 MG tablet Take 1 mg by mouth daily.    Marland Kitchen HYDROcodone-acetaminophen (NORCO/VICODIN) 5-325 MG tablet Take 1-2 tablets by mouth every 4 (four) hours as needed for moderate pain. (Patient not taking: Reported on  06/24/2020) 30 tablet 0  . tamsulosin (FLOMAX) 0.4 MG CAPS capsule Take 1 capsule (0.4 mg total) by mouth daily. (Patient not taking: Reported on 06/24/2020) 30 capsule 2    Drug Regimen Review Drug regimen was reviewed and remains appropriate with no significant issues identified  Home: Home Living Family/patient expects to be discharged to:: Private residence Living Arrangements: Alone Available Help at Discharge: Family, Available PRN/intermittently Type of Home: House Home Layout: One level Bathroom Shower/Tub: Multimedia programmer: Standard Home Equipment: None   Functional History: Prior Function Level of Independence: Independent  Functional Status:  Mobility: Bed Mobility Overal bed mobility: Needs Assistance Bed Mobility: Rolling, Sidelying to Sit Rolling: Min guard Sidelying to sit: Min assist Supine to sit: Min guard, HOB elevated Sit to supine: Min assist General bed mobility comments: Pt seated in recliner on arrival. Transfers Overall transfer level: Needs assistance Equipment used: Rolling walker (2 wheeled) Transfers: Sit to/from Stand Sit to Stand: Mod assist General transfer comment: Pt required cues to scoot to edge of recliner, for foot placement, to push from arm rest and lean forward.  Pt with smoother transition but intial mod assistance to boost into standing. Ambulation/Gait Ambulation/Gait assistance: Min guard Gait Distance (Feet): 120 Feet (standing rest break  after 20 ft for around 30 seconds.) Assistive device: Rolling walker (2 wheeled) Gait Pattern/deviations: Step-through pattern, Wide base of support, Trunk flexed General Gait Details: Cues for upper trunk control and scapular retraction.  Pt continues to move with slow gt speed and noticeable fatigue. Gait velocity: reduced Gait velocity interpretation: <1.31 ft/sec, indicative of household ambulator    ADL: ADL Overall ADL's : Needs assistance/impaired Eating/Feeding:  Set up, Sitting Grooming: Oral care, Standing, Min guard Grooming Details (indicate cue type and reason): min guard for safety while standing at sink with RW Upper Body Bathing: Set up, Sitting Lower Body Bathing: Sit to/from stand, Moderate assistance Lower Body Bathing Details (indicate cue type and reason): able to complete figure four positioning Upper Body Dressing : Minimal assistance, Sitting Lower Body Dressing: Moderate assistance, Sit to/from stand Lower Body Dressing Details (indicate cue type and reason): ot don socks seated EOB Toilet Transfer: Minimal assistance, Moderate assistance, RW, Ambulation Toilet Transfer Details (indicate cue type and reason): simulated via functional mobility with RW; min A for ambulation, but MOD A to power up from [place] and Hygiene: Moderate assistance Toileting - Clothing Manipulation Details (indicate cue type and reason): foley cath Functional mobility during ADLs: Minimal assistance, Rolling walker General ADL Comments: session focus on BUE HEP and standingn grooming tasks, pt continues to present with decreased ROM in RUE and impaired strength in BUEs  Cognition: Cognition Overall Cognitive Status: Impaired/Different from baseline Orientation Level: Oriented X4 Cognition Arousal/Alertness: Awake/alert Behavior During Therapy: WFL for tasks assessed/performed Overall Cognitive Status: Impaired/Different from baseline Area of Impairment: Problem solving Orientation Level: Disoriented to, Situation Current Attention Level: Focused Following Commands: Follows one step commands consistently Safety/Judgement: Decreased awareness of deficits Awareness: Intellectual Problem Solving: Slow processing, Requires verbal cues, Requires tactile cues General Comments: Pt required step by step instruction to perform sit to stand transfers.  Physical Exam: Blood pressure (!) 129/95, pulse (!) 122, temperature 97.7 F  (36.5 C), temperature source Oral, resp. rate 18, height 6\' 1"  (1.854 m), weight 70.1 kg, SpO2 99 %. Physical Exam General: Sedated from medications, No apparent distress HEENT: Head is normocephalic, atraumatic, PERRLA, EOMI, sclera anicteric, oral mucosa pink and moist, dentition intact, ext ear canals clear,  Neck: Supple without JVD or lymphadenopathy Heart: Reg rate and rhythm. No murmurs rubs or gallops Chest: CTA bilaterally without wheezes, rales, or rhonchi; no distress Abdomen: Soft, non-tender, non-distended, bowel sounds positive. Extremities: No clubbing, cyanosis, or edema. Pulses are 2+ Skin: Clean and intact without signs of breakdown Neuro: Sedated from medication and unable to tolerate exam Psych: Pt's affect is appropriate. Pt is cooperative  Results for orders placed or performed during the hospital encounter of 06/24/20 (from the past 48 hour(s))  Glucose, capillary     Status: Abnormal   Collection Time: 07/01/20 11:49 AM  Result Value Ref Range   Glucose-Capillary 118 (H) 70 - 99 mg/dL    Comment: Glucose reference range applies only to samples taken after fasting for at least 8 hours.  CBC     Status: Abnormal   Collection Time: 07/02/20  2:25 AM  Result Value Ref Range   WBC 6.2 4.0 - 10.5 K/uL    Comment: WHITE COUNT CONFIRMED ON SMEAR   RBC 2.92 (L) 4.22 - 5.81 MIL/uL   Hemoglobin 8.0 (L) 13.0 - 17.0 g/dL   HCT 27.3 (L) 39 - 52 %   MCV 93.5 80.0 - 100.0 fL   MCH 27.4 26.0 - 34.0 pg  MCHC 29.3 (L) 30.0 - 36.0 g/dL   RDW 21.8 (H) 11.5 - 15.5 %   Platelets 300 150 - 400 K/uL   nRBC 5.6 (H) 0.0 - 0.2 %    Comment: Performed at [facility] [address], [place], [place] [address]  Basic metabolic panel     Status: Abnormal   Collection Time: 07/02/20  2:25 AM  Result Value Ref Range   Sodium 139 135 - 145 mmol/L   Potassium 3.8 3.5 - 5.1 mmol/L   Chloride 108 98 - 111 mmol/L   CO2 23 22 - 32 mmol/L   Glucose, Bld 114 (H) 70 - 99 mg/dL     Comment: Glucose reference range applies only to samples taken after fasting for at least 8 hours.   BUN 15 8 - 23 mg/dL   Creatinine, Ser 0.59 (L) 0.61 - 1.24 mg/dL   Calcium 7.7 (L) 8.9 - 10.3 mg/dL   GFR, Estimated >60 >60 mL/min    Comment: (NOTE) Calculated using the CKD-EPI Creatinine Equation (2021)    Anion gap 8 5 - 15    Comment: Performed at [facility] [address], [place], [place] [address]  Magnesium     Status: None   Collection Time: 07/02/20  2:25 AM  Result Value Ref Range   Magnesium 2.1 1.7 - 2.4 mg/dL    Comment: Performed at [facility] [address], [place], [place] [address]  CBC     Status: Abnormal   Collection Time: 07/03/20  1:47 AM  Result Value Ref Range   WBC 5.4 4.0 - 10.5 K/uL   RBC 3.00 (L) 4.22 - 5.81 MIL/uL   Hemoglobin 8.4 (L) 13.0 - 17.0 g/dL   HCT 28.4 (L) 39 - 52 %   MCV 94.7 80.0 - 100.0 fL   MCH 28.0 26.0 - 34.0 pg   MCHC 29.6 (L) 30.0 - 36.0 g/dL   RDW 21.9 (H) 11.5 - 15.5 %   Platelets 295 150 - 400 K/uL   nRBC 4.5 (H) 0.0 - 0.2 %    Comment: Performed at [facility] [address], [place], [place] [address]  Basic metabolic panel     Status: Abnormal   Collection Time: 07/03/20  1:47 AM  Result Value Ref Range   Sodium 141 135 - 145 mmol/L   Potassium 4.0 3.5 - 5.1 mmol/L   Chloride 109 98 - 111 mmol/L   CO2 22 22 - 32 mmol/L   Glucose, Bld 86 70 - 99 mg/dL    Comment: Glucose reference range applies only to samples taken after fasting for at least 8 hours.   BUN 15 8 - 23 mg/dL   Creatinine, Ser 0.51 (L) 0.61 - 1.24 mg/dL   Calcium 7.9 (L) 8.9 - 10.3 mg/dL   GFR, Estimated >60 >60 mL/min    Comment: (NOTE) Calculated using the CKD-EPI Creatinine Equation (2021)    Anion gap 10 5 - 15    Comment: Performed at [facility] [name], [place], [place] [address]  Magnesium     Status: None   Collection Time: 07/03/20  1:47 AM  Result Value Ref Range   Magnesium 2.1 1.7 - 2.4 mg/dL     Comment: Performed at [facility] [address] [place], [place] [address]   No results found.     Medical Problem List and Plan: 1.  Decreased functional mobility secondary to  acute respiratory failure decompensated diastolic congestive heart failure bilateral pleural effusions status post thoracentesis  -patient may shower  -ELOS/Goals: modI 5-7 days 2.  Antithrombotics: -DVT/anticoagulation: Eliquis mg BID  -antiplatelet therapy: N/A 3. Pain Management: Tylenol as needed 4. Mood: Provide emotional support  -antipsychotic agents: N/A 5. Neuropsych: This patient is capable of making decisions on his own behalf. 6. Skin/Wound Care: Routine skin checks 7. Fluids/Electrolytes/Nutrition: Routine in and outs with follow-up chemistries 8.  Prostate cancer with bony metastasis.  Plans to follow-up outpatient Dr. [name] as well as Dr. Mickeal Skinner and plan to repeat MRI of the brain 2 months.  Continue Casodex 50 mg daily 9.  Atrial fibrillation with RVR.  Lanoxin 0.25 mg daily, Cardizem 30 mg every 8 hours, Toprol-XL 100 mg twice daily 10. Acute on chronic anemia. Latest hemoglobin 8.4. Follow-up CBC 11.  Hypothyroidism.  Synthroid 41mcg daily.  Latest TSH 7.345.  Synthroid was recently initiated while in [place] earlier this month.  Plan to repeat TFTs 4 to 6 weeks. 12.  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  Foley catheter tube placed in [place].DO NOT REMOVE FOLEY TUBE FOR NOW 13. S/p cervical laminectomy with tumor removal in June 2020: monitor for pain, neurological deficits  Cathlyn Parsons, PA-C 07/03/2020   I have personally performed a face to face diagnostic evaluation, including, but not limited to relevant history and physical exam findings, of this patient and developed relevant assessment and plan.  Additionally, I have reviewed and concur with the physician assistant's documentation above.  Leeroy Cha, MD

## 2020-07-03 NOTE — Care Management (Signed)
Dr. Lupita Leash is scheduled for Peer to Peer on 07/04/20 at 10:00am with Dr. Philis Pique, they will contact him on cell phone. TOC Team will continue to monitor. Ricki Miller, RN BSN Case Manager

## 2020-07-03 NOTE — Care Management (Signed)
Case Manager received call from [facility] with [employer], requesting MD to do Peer to Peer review for Hospital stay.  CM provided Dr. Lupita Leash with number: [phone], option 4 for provider line. TOC Team will continue to monitor.  Ricki Miller, RN BSN Case Manager [phone]

## 2020-07-03 NOTE — Progress Notes (Signed)
Inpatient Rehabilitation Admissions Coordinator  I met at bedside with patient. I explained that I have not yet received final determination from his insurance about CIR admit, but there are doubts I will get approval. I will let acute team know as soon as I get their decision.  Danne Baxter, RN, MSN Rehab Admissions Coordinator [phone] 07/03/2020 11:21 AM

## 2020-07-03 NOTE — Progress Notes (Signed)
PROGRESS NOTE    Chris Morales  OQH:[id number] DOB: 1955-11-29 DOA: 06/24/2020 PCP: Patient, No Pcp Per   Chief Complaint  Patient presents with  . 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  Was recently hospitalized mid [facility] 54/65-68/12 with complicated hospital course with A. fib RVR, respiratory failure needing intubation vent support, tried on multiple AV nodal blocking agents including amiodarone digoxin and finally on scheduled metoprolol therapy, attempts for chemical cardioversion failed, extubated.  Post extubation patient realized he was suffering from left-sided weakness particularly left upper extremity.  He was frustrated as to the details of getting an MRI of his brain and decided to leave [place] morning of 10/25.  Then patient immediately jumped on a flight down to [facility] for his brother retired OB/GYN lives, after landing he proceeded to come to [facility] emergency department. In the ED found to have A. fib with RVR, chest x-ray with bilateral pleural effusion and diffuse metastatic lesions, left-sided weakness, elevated D-dimer-CT angio negative for PE, with severe shortness of breath respiratory failure. Patient Complicated hospital course being treated for A. fib RVR, respiratory failure due to diastolic congestive heart failure, exudative bilateral pleural effusion likely malignant, left-sided weakness with MRI brain no infection but possible dural based metastasis versus meningioma, postobstructive uropathy, anemia, prostate cancer with metastasis.  Patient is followed by oncology/neuro-oncology At this time patient heart rate is stable on digoxin, diltiazem and metoprolol, anticoagulated on Eliquis PT OT following and  has advised CIR and awaiting for placement  Subjective: Seen this morning sitting on the edge of bed, feels well, still weak mostly in the left upper extremity A fib- rate stable to high Renal function is stable, hemoglobin slightly stable afternoon 8.4 g, no leukocytosis, CBG stable  Assessment & Plan:  Atrial fibrillation with rapid ventricular response: Recent extensive hospitalization, rate fairly stable continue  Cardizem 30 every 8 hours, digoxin 0.25 mg and metoprolol 100 mg along with anticoagulation Eliquis.  Metastatic prostate cancer, status post C4 6 laminectomy June/22/2020 with pathology documenting metastatic prostate cancer, CT abdomen pelvis 02/19/19-prostatic enlargement multiple bone lesions LLL lung nodule restaging 06/25/2020 CT chest angios shows bilateral effusions, extensive bony metastatic disease, brain MRI 10/26/210 shows possible dural based metastasis.  Seen by oncology here on Casodex started 10/31 and Lupron started 11/1.  He has tolerated well so far, plans for outpatient follow-up with Dr. [name] for ongoing management of his metastatic prostate cancer next appointment 11/17.  Plan is to repeat MRI scan in about 2 months which will be reviewed in changes tumor board  Acute hypoxic respiratory failure due to diastolic congestive heart failure/likely exudative bilateral pleural effusions: Chest x-ray CT angio no PE but with pleural effusion, echo shows EF 50 to 55% is large pleural effusion on left, moderate TR.  Underwent Rt thoracentesis 10/28, 1.6 L dark yellow fluid removed, pathology with reactive nasal cells,then s/p left thoracentesis 10/29 with 1.3 L bright yellow fluid.  Pleural fluid consistent with exudative effusion, pleural fluid culture no growth so far.  Chest x-ray 11/1 shows no significant recurrence of pleural effusion.Wean off oxygen.  Back on home Eliquis.   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  Patient was unable to  tolerate MRI C-spine few times so not pursued it. Continue PT OT and CIR.  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 Foley cathete  Euthyroid sick syndrome/hypothyroidism continue Synthroid.  TSH 7.3.  Thyroid function test in 4 to 6 weeks to adjust dose.  Normocytic anemia likely from chronic anemia of chronic disease/malignancy.  Transfuse for hemoglobin less than 7 g.Status post 1 unit PRBC 10/30, iron 25 TIBC 193 low ferritin high folate normal B12 633.  Monitor.  Elevated d-dimer 19.3 no PE on the CT angio but patient may work CT abdomen pelvis revealed [place] liver concerning for portal vein thrombosis, ultrasound Doppler liver unremarkable.  Could be due to malignancy.He is on anticoagulation.    Goals of care, counseling/discussion:Full code, seen by palliative care.  Nutrition: Diet Order            Diet Heart Room service appropriate? Yes; Fluid consistency: Thin  Diet effective now                  Body mass index is 20.39 kg/m.  DVT prophylaxis: Place TED hose Start: 06/25/20 1957 On PO Eliquis Code Status:   Code Status: Full Code  Family Communication: plan of care discussed with patient at bedside.  Status is: Inpatient  Remains inpatient appropriate because:Unsafe d/c plan and Inpatient level of care appropriate due to severity of illness  Difficult disposition as patient is out of state and came to [place] to be closer to his brother who will be providing support. He is out of network. I did reach out to the number provided to P2P, I was in the line for close to 30 minutes, able to speak someone who transferred me to Dept called "PACS" where there was no one to pick the phone and I left my callback number along with patient's Rica Mote ID and code number.  Dispo:  Patient From: Home  Planned Disposition: Inpatient Rehab  Expected discharge  date: 07/03/20  Medically stable for discharge: No  Consultants:see note  Procedures:see note  Culture/Microbiology    Component Value Date/Time   SDES PLEURAL FLUID 06/28/2020 1053   SDES PLEURAL FLUID 06/28/2020 1053   SPECREQUEST LEFT LUNG 06/28/2020 1053   SPECREQUEST LEFT LUNG 06/28/2020 1053   CULT  06/28/2020 1053    NO GROWTH 5 DAYS Performed at [facility], [place] [address], [place],  [address]    REPTSTATUS 07/03/2020 FINAL 06/28/2020 1053   REPTSTATUS 06/28/2020 FINAL 06/28/2020 1053    Other culture-see note  Medications: Scheduled Meds: . apixaban  5 mg Oral BID  . bicalutamide  50 mg Oral Daily  . Chlorhexidine Gluconate Cloth  6 each Topical Daily  . digoxin  0.25 mg Oral Daily  . diltiazem  30 mg Oral Q8H  . levothyroxine  25 mcg Oral QAC breakfast  . metoprolol succinate  100 mg Oral BID   Continuous Infusions:  Antimicrobials: Anti-infectives (From admission, onward)   None     Objective: Vitals: Today's Vitals   07/02/20 2000 07/03/20 0109 07/03/20 0502 07/03/20 0820  BP:  111/78 (!) 129/95 121/90  Pulse:  (!) 122 (!) 122 (!) 121  Resp:  15 18 19   Temp:  98.1 F (36.7 C) 97.7 F (36.5 C) 98.3 F (36.8 C)  TempSrc:  Oral Oral Oral  SpO2:  99% 99% 97%  Weight:   70.1 kg   Height:      PainSc: 0-No pain       Intake/Output Summary (Last 24  hours) at 07/03/2020 0832 Last data filed at 07/02/2020 2000 Gross per 24 hour  Intake 120 ml  Output --  Net 120 ml   Filed Weights   07/01/20 0537 07/02/20 0535 07/03/20 0502  Weight: 70 kg 69.2 kg 70.1 kg   Weight change: 0.9 kg  Intake/Output from previous day: 11/02 0701 - 11/03 0700 In: 120 [P.O.:120] Out: -  Intake/Output this shift: No intake/output data recorded.  Examination: General exam: AAOx3, old for age,NAD, weak appearing. HEENT:Oral mucosa moist, Ear/Nose WNL grossly,dentition normal. Respiratory system: bilaterally clear,no wheezing or crackles,no use of  accessory muscle, non tender. Cardiovascular system: S1 & S2 +, regular, No JVD. Gastrointestinal system: Abdomen soft, NT,ND, BS+. Nervous System: Weak left upper extremity, alert, awake, moving extremities and grossly nonfocal Extremities: No edema, distal peripheral pulses palpable.  Skin: No rashes,no icterus. MSK: Normal muscle bulk,tone, power  Data Reviewed: I have personally reviewed following labs and imaging studies CBC: Recent Labs  Lab 06/28/20 0202 06/29/20 0408 06/30/20 0448 07/02/20 0225 07/03/20 0147  WBC 7.4 8.3 7.5 6.2 5.4  HGB 7.4* 7.2* 8.3* 8.0* 8.4*  HCT 25.0* 24.7* 27.4* 27.3* 28.4*  MCV 91.9 93.9 92.6 93.5 94.7  PLT 245 253 275 300 034   Basic Metabolic Panel: Recent Labs  Lab 06/28/20 0202 06/29/20 0408 06/30/20 0448 07/02/20 0225 07/03/20 0147  NA 139 139 141 139 141  K 4.0 4.1 4.0 3.8 4.0  CL 108 109 112* 108 109  CO2 23 21* 22 23 22   GLUCOSE 108* 87 94 114* 86  BUN 14 15 13 15 15   CREATININE 0.68 0.62 0.53* 0.59* 0.51*  CALCIUM 7.3* 7.3* 7.4* 7.7* 7.9*  MG 1.9 1.9 1.9 2.1 2.1   GFR: Estimated Creatinine Clearance: 92.5 mL/min (A) (by C-G formula based on SCr of 0.51 mg/dL (L)). Liver Function Tests: Recent Labs  Lab 06/27/20 1520  PROT 5.3*   No results for input(s): LIPASE, AMYLASE in the last 168 hours. No results for input(s): AMMONIA in the last 168 hours. Coagulation Profile: No results for input(s): INR, PROTIME in the last 168 hours. Cardiac Enzymes: No results for input(s): CKTOTAL, CKMB, CKMBINDEX, TROPONINI in the last 168 hours. BNP (last 3 results) No results for input(s): PROBNP in the last 8760 hours. HbA1C: No results for input(s): HGBA1C in the last 72 hours. CBG: Recent Labs  Lab 07/01/20 1149  GLUCAP 118*   Lipid Profile: No results for input(s): CHOL, HDL, LDLCALC, TRIG, CHOLHDL, LDLDIRECT in the last 72 hours. Thyroid Function Tests: No results for input(s): TSH, T4TOTAL, FREET4, T3FREE, THYROIDAB in  the last 72 hours. Anemia Panel: No results for input(s): VITAMINB12, FOLATE, FERRITIN, TIBC, IRON, RETICCTPCT in the last 72 hours. Sepsis Labs: Recent Labs  Lab 06/26/20 1038 06/27/20 0202  LATICACIDVEN 2.6* 1.2    Recent Results (from the past 240 hour(s))  Urine culture     Status: None   Collection Time: 06/24/20  9:57 PM   Specimen: Urine, Catheterized  Result Value Ref Range Status   Specimen Description URINE, CATHETERIZED  Final   Special Requests NONE  Final   Culture   Final    NO GROWTH Performed at [facility], [address] [place], [place] [address]    Report Status 06/26/2020 FINAL  Final  Respiratory Panel by RT PCR (Flu A&B, Covid) - Nasopharyngeal Swab     Status: None   Collection Time: 06/25/20  4:00 PM   Specimen: Nasopharyngeal Swab  Result Value Ref  Range Status   SARS Coronavirus 2 by RT PCR NEGATIVE NEGATIVE Final    Comment: (NOTE) SARS-CoV-2 target nucleic acids are NOT DETECTED.  The SARS-CoV-2 RNA is generally detectable in upper respiratoy specimens during the acute phase of infection. The lowest concentration of SARS-CoV-2 viral copies this assay can detect is 131 copies/mL. A negative result does not preclude SARS-Cov-2 infection and should not be used as the sole basis for treatment or other patient management decisions. A negative result may occur with  improper specimen collection/handling, submission of specimen other than nasopharyngeal swab, presence of viral mutation(s) within the areas targeted by this assay, and inadequate number of viral copies (<131 copies/mL). A negative result must be combined with clinical observations, patient history, and epidemiological information. The expected result is Negative.  Fact Sheet for Patients:  PinkCheek.be  Fact Sheet for Healthcare Providers:  GravelBags.it  This test is no t yet approved or cleared by the [place]  FDA and  has been authorized for detection and/or diagnosis of SARS-CoV-2 by FDA under an Emergency Use Authorization (EUA). This EUA will remain  in effect (meaning this test can be used) for the duration of the COVID-19 declaration under Section 564(b)(1) of the Act, 21 U.S.C. section 360bbb-3(b)(1), unless the authorization is terminated or revoked sooner.     Influenza A by PCR NEGATIVE NEGATIVE Final   Influenza B by PCR NEGATIVE NEGATIVE Final    Comment: (NOTE) The Xpert Xpress SARS-CoV-2/FLU/RSV assay is intended as an aid in  the diagnosis of influenza from Nasopharyngeal swab specimens and  should not be used as a sole basis for treatment. Nasal washings and  aspirates are unacceptable for Xpert Xpress SARS-CoV-2/FLU/RSV  testing.  Fact Sheet for Patients: PinkCheek.be  Fact Sheet for Healthcare Providers: GravelBags.it  This test is not yet approved or cleared by the [place] FDA and  has been authorized for detection and/or diagnosis of SARS-CoV-2 by  FDA under an Emergency Use Authorization (EUA). This EUA will remain  in effect (meaning this test can be used) for the duration of the  Covid-19 declaration under Section 564(b)(1) of the Act, 21  U.S.C. section 360bbb-3(b)(1), unless the authorization is  terminated or revoked. Performed at [facility], [place] [address] [place], [place] [address]   SARS CORONAVIRUS 2 (TAT 6-24 HRS) Nasopharyngeal Nasopharyngeal Swab     Status: None   Collection Time: 06/26/20 11:40 AM   Specimen: Nasopharyngeal Swab  Result Value Ref Range Status   SARS Coronavirus 2 NEGATIVE NEGATIVE Final    Comment: (NOTE) SARS-CoV-2 target nucleic acids are NOT DETECTED.  The SARS-CoV-2 RNA is generally detectable in upper and lower respiratory specimens during the acute phase of infection. Negative results do not preclude SARS-CoV-2 infection, do not rule  out co-infections with other pathogens, and should not be used as the sole basis for treatment or other patient management decisions. Negative results must be combined with clinical observations, patient history, and epidemiological information. The expected result is Negative.  Fact Sheet for Patients: SugarRoll.be  Fact Sheet for Healthcare Providers: https://www.woods-mathews.com/  This test is not yet approved or cleared by the [place] FDA and  has been authorized for detection and/or diagnosis of SARS-CoV-2 by FDA under an Emergency Use Authorization (EUA). This EUA will remain  in effect (meaning this test can be used) for the duration of the COVID-19 declaration under Se ction 564(b)(1) of the Act, 21 U.S.C. section 360bbb-3(b)(1), unless the authorization is terminated or  revoked sooner.  Performed at [facility], [place] [address], [place], [place] [address]   Gram stain     Status: None   Collection Time: 06/27/20 11:40 AM   Specimen: PATH Cytology Pleural fluid  Result Value Ref Range Status   Specimen Description PLEURAL FLUID  Final   Special Requests NONE  Final   Gram Stain   Final    WBC PRESENT,BOTH PMN AND MONONUCLEAR NO ORGANISMS SEEN CYTOSPIN SMEAR Performed at [facility], [place] [address] [place], [place] [address]    Report Status 06/27/2020 FINAL  Final  Culture, body fluid-bottle     Status: None   Collection Time: 06/27/20 11:40 AM   Specimen: Pleura  Result Value Ref Range Status   Specimen Description PLEURAL FLUID  Final   Special Requests NONE  Final   Culture   Final    NO GROWTH 5 DAYS Performed at [place] [address], [place], [place] [address]    Report Status 07/02/2020 FINAL  Final  Culture, body fluid-bottle     Status: None   Collection Time: 06/28/20 10:53 AM   Specimen: Pleura  Result Value Ref Range Status   Specimen Description PLEURAL FLUID  Final    Special Requests LEFT LUNG  Final   Culture   Final    NO GROWTH 5 DAYS Performed at [place] [address] [place], [place] [address]    Report Status 07/03/2020 FINAL  Final  Gram stain     Status: None   Collection Time: 06/28/20 10:53 AM   Specimen: Pleura  Result Value Ref Range Status   Specimen Description PLEURAL FLUID  Final   Special Requests LEFT LUNG  Final   Gram Stain   Final    WBC PRESENT,BOTH PMN AND MONONUCLEAR NO ORGANISMS SEEN CYTOSPIN SMEAR Performed at [facility], [address], [place], [place] [address]    Report Status 06/28/2020 FINAL  Final     Radiology Studies: No results found.   LOS: 8 days   Antonieta Pert, MD [facility]  07/03/2020, 8:32 AM

## 2020-07-03 NOTE — Progress Notes (Signed)
Inpatient Rehabilitation Admissions Coordinator  CIR is negotiating out of network benefits with Rica Mote of [employer]. Hopeful for consensus tomorrow.  Danne Baxter, RN, MSN Rehab Admissions Coordinator [phone] 07/03/2020 6:03 PM

## 2020-07-03 NOTE — Progress Notes (Signed)
Inpatient Rehabilitation Admissions Coordinator  Therapy feels patient can tolerate the intensity of Cir therapy 3 hrs per day , 5 days per week. Patient remains very motivated and we expect ELOS 7 to 10 days.  Danne Baxter, RN, MSN Rehab Admissions Coordinator [phone] 07/03/2020 12:09 PM

## 2020-07-04 DIAGNOSIS — I4891 Unspecified atrial fibrillation: Secondary | ICD-10-CM | POA: Diagnosis not present

## 2020-07-04 MED ORDER — LORAZEPAM 1 MG PO TABS
1.5000 mg | ORAL_TABLET | Freq: Once | ORAL | Status: AC
  Administered 2020-07-04: 1.5 mg via ORAL
  Filled 2020-07-04: qty 1

## 2020-07-04 NOTE — Progress Notes (Signed)
PT Cancellation Note  Patient Details Name: Chris Morales MRN: [id number] DOB: [birth year]   Cancelled Treatment:    Reason Eval/Treat Not Completed: Fatigue/lethargy limiting ability to participate. Pt had ativan for attempted MRI and currently very sleepy.    Shary Decamp [facility] 07/04/2020, 3:34 PM [place] Pager [phone] Office [phone]

## 2020-07-04 NOTE — Progress Notes (Signed)
Occupational Therapy Treatment Patient Details Name: Chris Morales MRN: [id number] DOB: 19-Mar-1956 Today's Date: 07/04/2020    History of present illness 64 year old male with past medical history of prostate cancer, diffusely metastatic to bone presented to [facility] emergency department with complaints of tachycardia, lower extremity edema and left-sided weakness. Pt adm with afib with rvr, acute hypoxic respiratory failure likely secondary to malignant bil pleural effusions. Pt had recently been hospitalized in [place] with afib with rvr and resp failure requiring vent support and then left AMA before flying back to [facility] and presenting immediately to ED. 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 MRI - thickening and enhancement along the posterior falx. New hyperintensity in R parietal lobe that may reflect associated edema.  PMH - prostate CA with bony mets, C3-4 laminectomy with tumor removal 01/2019, afib   OT comments  Pt seen for OT follow up session with focus on ADL mobility progression and BUE strengthening program. Pt was able to complete functional mobility a household distance with min guard assist to prevent falls. Pt remains with high guard with dynamic balance activities. Pt then completed x1 standing grooming activity at sink with min guard assist and intermittent one UE support on sink. Pt then complete BUE exercises as listed below. Pts ADLs continue to be limited by BUE strength deficits and dysfunction. Cognitive deficits in problem solving noted, will assess further as appropriate. Continue to recommend CIR at d/c. Will continue to follow.   Follow Up Recommendations  CIR    Equipment Recommendations  3 in 1 bedside commode    Recommendations for Other Services      Precautions / Restrictions Precautions Precautions: Fall Restrictions Weight Bearing Restrictions: No       Mobility Bed Mobility               General bed mobility  comments: Pt seated in recliner on arrival.  Transfers Overall transfer level: Needs assistance Equipment used: Rolling walker (2 wheeled) Transfers: Sit to/from Stand Sit to Stand: Min guard         General transfer comment: pt able to progress to power up of min guard assist with RW. Use of bil hand rests to push    Balance Overall balance assessment: Needs assistance Sitting-balance support: Feet supported Sitting balance-Leahy Scale: Fair     Standing balance support: During functional activity;Single extremity supported Standing balance-Leahy Scale: Poor Standing balance comment: at least one UE supported during standing ADLs                           ADL either performed or assessed with clinical judgement   ADL       Grooming: Min guard;Standing Grooming Details (indicate cue type and reason): standing at sink with intermittent UE bracing for oral care                 Toilet Transfer: Min guard;Ambulation;RW Toilet Transfer Details (indicate cue type and reason): able to power up with min guard this session, which is improvement from previous sessions         Functional mobility during ADLs: Min guard;Rolling walker General ADL Comments: pt able to progress in ADL mobility, also focus on BUE HEP     Vision Patient Visual Report: No change from baseline     Perception     Praxis      Cognition Arousal/Alertness: Awake/alert Behavior During Therapy: WFL for tasks assessed/performed Overall  Cognitive Status: Impaired/Different from baseline Area of Impairment: Problem solving                             Problem Solving: Slow processing;Requires verbal cues;Requires tactile cues General Comments: continues to require step by step instruction- appears to have difficulty following multistep/higher level instruction        Exercises Other Exercises Other Exercises: bil scapular elevation, retraction, depression x10; bil  table slides with wash cloth bil x10 Other Exercises: shoulder abduction with level one theraband x10; bil bicep curls with level one theraband x10 Other Exercises: LUE composite flexion/extension with level one thereaputty, pincer grasp with LUE and putty   Shoulder Instructions       General Comments      Pertinent Vitals/ Pain       Pain Assessment: No/denies pain  Home Living                                          Prior Functioning/Environment              Frequency  Min 3X/week        Progress Toward Goals  OT Goals(current goals can now be found in the care plan section)  Progress towards OT goals: Progressing toward goals  Acute Rehab OT Goals Patient Stated Goal: return to work in December OT Goal Formulation: With patient Time For Goal Achievement: 07/11/20 Potential to Achieve Goals: Good  Plan Discharge plan remains appropriate;Frequency remains appropriate    Co-evaluation                 AM-PAC OT "6 Clicks" Daily Activity     Outcome Measure   Help from another person eating meals?: A Little Help from another person taking care of personal grooming?: A Little Help from another person toileting, which includes using toliet, bedpan, or urinal?: A Little Help from another person bathing (including washing, rinsing, drying)?: A Lot Help from another person to put on and taking off regular upper body clothing?: A Little Help from another person to put on and taking off regular lower body clothing?: A Lot 6 Click Score: 16    End of Session Equipment Utilized During Treatment: Rolling walker;Gait belt  OT Visit Diagnosis: Unsteadiness on feet (R26.81);Other abnormalities of gait and mobility (R26.89);Muscle weakness (generalized) (M62.81);Other symptoms and signs involving cognitive function   Activity Tolerance Patient tolerated treatment well   Patient Left in chair;with call bell/phone within reach   Nurse  Communication Mobility status        Time: 1140-1206 OT Time Calculation (min): 26 min  Charges: OT General Charges $OT Visit: 1 Visit OT Treatments $Therapeutic Activity: 23-37 mins  Zenovia Jarred, MSOT, OTR/L [facility] Office Number: [phone] Pager: [phone]  Zenovia Jarred 07/04/2020, 1:52 PM

## 2020-07-04 NOTE — Progress Notes (Signed)
PROGRESS NOTE    Chris Morales  ZDG:[id number] DOB: 08/06/1956 DOA: 06/24/2020 PCP: Patient, No Pcp Per   Chief Complaint  Patient presents with  . 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  Was recently hospitalized mid [facility] 95/18-84/16 with complicated hospital course with A. fib RVR, respiratory failure needing intubation vent support, tried on multiple AV nodal blocking agents including amiodarone digoxin and finally on scheduled metoprolol therapy, attempts for chemical cardioversion failed, extubated. Post extubation patient realized he was suffering from left-sided weakness particularly left upper extremity.  He was frustrated as to the details of getting an MRI of his brain and decided to leave [place] morning of 10/25.  Then patient immediately jumped on a flight down to [facility] for his brother retired OB/GYN lives, after landing he proceeded to come to [facility] emergency department. In the ED found to have A. fib with RVR, chest x-ray with bilateral pleural effusion and diffuse metastatic lesions, left-sided weakness, elevated D-dimer-CT angio negative for PE, with severe shortness of breath respiratory failure. Patient was treated with Cardizem bolus and infusion, received IV Lasix in the ED for acute hypoxic respiratory failure/distress complicated hospital course being treated for A. fib RVR, respiratory failure due to diastolic congestive heart failure, exudative bilateral pleural effusion likely malignant, left-sided weakness with MRI brain no infection but possible dural based metastasis versus meningioma, postobstructive uropathy, anemia, prostate cancer with metastasis.  Patient is followed by oncology/neuro-oncology At this  time patient heart rate is stable on digoxin, diltiazem and metoprolol, anticoagulated on Eliquis PT OT following and has advised CIR and awaiting for placement  Subjective: Seen this morning resting comfortably.  He introduced me to his doctor friend at the bedside who is visiting from [place]. Mild left upper extremity weakness but patient reports it is overall better. He is agreeable for trying one more time MRI C-spine and would like to have 1.5 mg Ativan before MRI. Heart rate controlled in 60s.  On room air.  Assessment & Plan:  Atrial fibrillation with rapid ventricular response: Recent extensive hospitalization.  Rate is currently well controlled, continue Cardizem 30 every 8 hours, digoxin 0.25 mg and metoprolol 100 mg along with Eliquis for anticoagulation.  Monitor in telemetry.   Metastatic prostate cancer, status post C4-6 laminectomy June/22/2020 with pathology documenting metastatic prostate cancer, CT abdomen pelvis 02/19/19-prostatic enlargement multiple bone lesions LLL lung nodule restaging 06/25/2020 CT chest angios shows bilateral effusions, extensive bony metastatic disease, brain MRI 10/26/210 shows possible dural based metastasis.  Seen by oncology here on Casodex started 10/31 and Lupron started 11/1.  He has tolerated well so far.  Noted oncology plan for outpatient follow-up with Dr. [name] for ongoing management of his metastatic prostate cancer- next appointment 11/17 and oncology is planning to repeat MRI scan in about 2 months which will be reviewed in tumor board  Acute hypoxic respiratory failure due to diastolic congestive heart failure/likely exudative bilateral pleural effusions: Chest x-ray CT angio no PE but with pleural effusion, echo shows EF 50 to 55% is large pleural effusion on left, moderate TR.  Underwent Rt thoracentesis 10/28, 1.6 L dark yellow fluid removed, pathology with reactive nasal cells,then s/p left thoracentesis 10/29 with 1.3 L bright yellow fluid.   Pleural fluid consistent with exudative effusion, pleural fluid culture no growth so  far.  Chest x-ray 11/1 shows no significant recurrence of pleural effusion.he is currently off oxygen doing well on room air.    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  His arm weakness is much better.MRI C-spine has been attempted multiple times with multiple doses of Ativan but without success.He agrees to give 1 more try for MRI C-spine today with Ativan,request 1.5 mg. Continue PT OT and CIR he had MRI C-spine 01/2019- "Widespread osseous metastatic disease C4 through C7.Additional metastatic disease described separately.Enhancing epidural tumor maximal at the C5 vertebral segment dorsally, eccentric to the LEFT. Significant spinal stenosis and cord compression.Findings are most consistent with epidural spread of metastatic prostate cancer" will consult and discussed with neurolog.  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 Foley cathete to continue.  Euthyroid sick syndrome/hypothyroidism continue Synthroid.  TSH 7.3. Thyroid function test in 4 to 6 weeks to adjust dose.  Normocytic anemia likely from chronic anemia of chronic disease/malignancy.  Transfuse for hemoglobin less than 7 g.s/p 1 unit PRBC 10/30, iron 25 TIBC 193 low ferritin high folate normal B12 633.  Monitor.  Hemoglobin overall stable. Recent Labs  Lab 06/28/20 0202 06/29/20 0408 06/30/20 0448 07/02/20 0225 07/03/20 0147  HGB 7.4* 7.2* 8.3* 8.0* 8.4*  HCT 25.0* 24.7* 27.4* 27.3* 28.4*   Elevated d-dimer 19.3 no PE on the CT angio but patient may work CT abdomen pelvis revealed [place] liver concerning for portal vein thrombosis, ultrasound Doppler liver unremarkable.  Could be due to malignancy.He is on  anticoagulation already and will be continued..    Goals of care, counseling/discussion:Full code, seen by palliative care.  Nutrition: Diet Order            Diet Heart Room service appropriate? Yes; Fluid consistency: Thin  Diet effective now                  Body mass index is 20.45 kg/m.  DVT prophylaxis: Place TED hose Start: 06/25/20 1957 On PO Eliquis Code Status:   Code Status: Full Code  Family Communication: plan of care discussed with patient at bedside.  Status is: Inpatient  Remains inpatient appropriate because:Unsafe d/c plan and Inpatient level of care appropriate due to severity of illness  Difficult disposition as patient is out of state and came to [place] to be closer to his brother who will be providing support. He is out of network.  inpatient hospitalization has been declined and. P2P was done today-I explained him the need for hospitalization due to his A. fib RVR respiratory failure needing Lasix and Cardizem infusion and multiple thoracentesis and consultation-I was told they are still declining acute hospitalization due to admin reason- being out of network and that he left AMA and came down to [place] from [place], the reviewing MD did acknowledge that patient needed hospitalization due to his acute sickness when he presented.  CIR looking into inpatient rehabilitation if gets approved  If not he will return to his brother's house  Dispo:  Patient From: Home  Planned Disposition: Inpatient Rehab  Expected discharge date: 07/04/20  Medically stable for discharge: No  Consultants:see note  Procedures:see note  Culture/Microbiology    Component Value Date/Time   SDES PLEURAL FLUID 06/28/2020 1053   SDES PLEURAL FLUID 06/28/2020 1053   [place] LEFT LUNG 06/28/2020 1053   SPECRE[place] LEFT  LUNG 06/28/2020 1053   CULT  06/28/2020 1053    NO GROWTH 5 DAYS Performed at [facility], [place] [address], [place], [place] [address]    REPTSTATUS  07/03/2020 FINAL 06/28/2020 1053   REPTSTATUS 06/28/2020 FINAL 06/28/2020 1053    Other culture-see note  Medications: Scheduled Meds: . apixaban  5 mg Oral BID  . bicalutamide  50 mg Oral Daily  . Chlorhexidine Gluconate Cloth  6 each Topical Daily  . digoxin  0.25 mg Oral Daily  . diltiazem  30 mg Oral Q8H  . levothyroxine  25 mcg Oral QAC breakfast  . metoprolol succinate  100 mg Oral BID   Continuous Infusions:  Antimicrobials: Anti-infectives (From admission, onward)   None     Objective: Vitals: Today's Vitals   07/04/20 0631 07/04/20 0636 07/04/20 0739 07/04/20 0845  BP: 115/78 115/78 119/75   Pulse:  64 71   Resp:  18 18   Temp:  98.4 F (36.9 C) 98.7 F (37.1 C)   TempSrc:  Oral Oral   SpO2:  100% 100%   Weight:  70.3 kg    Height:      PainSc:    0-No pain    Intake/Output Summary (Last 24 hours) at 07/04/2020 1434 Last data filed at 07/04/2020 0845 Gross per 24 hour  Intake 120 ml  Output 550 ml  Net -430 ml   Filed Weights   07/02/20 0535 07/03/20 0502 07/04/20 0636  Weight: 69.2 kg 70.1 kg 70.3 kg   Weight change: 0.208 kg  Intake/Output from previous day: 11/03 0701 - 11/04 0700 In: -  Out: 1000 [Urine:1000] Intake/Output this shift: Total I/O In: 120 [P.O.:120] Out: -   Examination: General exam: AAOx3,NAD,weak appearing. HEENT:Oral mucosa moist, Ear/Nose WNL grossly, dentition normal. Respiratory system: bilaterally clear,no wheezing or crackles,no use of accessory muscle Cardiovascular system: S1 & S2 +, No JVD. Gastrointestinal system: Abdomen soft, NT,ND, BS+ Nervous System:Alert, awake, moving extremities well with fair strength on the left upper extremity more or less equal to the right,grossly nonfocal. Extremities: No edema, distal peripheral pulses palpable.  Skin: No rashes,no icterus. MSK: Normal muscle bulk,tone, power.  Data Reviewed: I have personally reviewed following labs and imaging studies CBC: Recent Labs  Lab  06/28/20 0202 06/29/20 0408 06/30/20 0448 07/02/20 0225 07/03/20 0147  WBC 7.4 8.3 7.5 6.2 5.4  HGB 7.4* 7.2* 8.3* 8.0* 8.4*  HCT 25.0* 24.7* 27.4* 27.3* 28.4*  MCV 91.9 93.9 92.6 93.5 94.7  PLT 245 253 275 300 546   Basic Metabolic Panel: Recent Labs  Lab 06/28/20 0202 06/29/20 0408 06/30/20 0448 07/02/20 0225 07/03/20 0147  NA 139 139 141 139 141  K 4.0 4.1 4.0 3.8 4.0  CL 108 109 112* 108 109  CO2 23 21* 22 23 22   GLUCOSE 108* 87 94 114* 86  BUN 14 15 13 15 15   CREATININE 0.68 0.62 0.53* 0.59* 0.51*  CALCIUM 7.3* 7.3* 7.4* 7.7* 7.9*  MG 1.9 1.9 1.9 2.1 2.1   GFR: Estimated Creatinine Clearance: 92.8 mL/min (A) (by C-G formula based on SCr of 0.51 mg/dL (L)). Liver Function Tests: Recent Labs  Lab 06/27/20 1520  PROT 5.3*   No results for input(s): LIPASE, AMYLASE in the last 168 hours. No results for input(s): AMMONIA in the last 168 hours. Coagulation Profile: No results for input(s): INR, PROTIME in the last 168 hours. Cardiac Enzymes: No results for input(s): CKTOTAL, CKMB, CKMBINDEX, TROPONINI in the last 168 hours. BNP (last  3 results) No results for input(s): PROBNP in the last 8760 hours. HbA1C: No results for input(s): HGBA1C in the last 72 hours. CBG: Recent Labs  Lab 07/01/20 1149  GLUCAP 118*   Lipid Profile: No results for input(s): CHOL, HDL, LDLCALC, TRIG, CHOLHDL, LDLDIRECT in the last 72 hours. Thyroid Function Tests: No results for input(s): TSH, T4TOTAL, FREET4, T3FREE, THYROIDAB in the last 72 hours. Anemia Panel: No results for input(s): VITAMINB12, FOLATE, FERRITIN, TIBC, IRON, RETICCTPCT in the last 72 hours. Sepsis Labs: No results for input(s): PROCALCITON, LATICACIDVEN in the last 168 hours.  Recent Results (from the past 240 hour(s))  Urine culture     Status: None   Collection Time: 06/24/20  9:57 PM   Specimen: Urine, Catheterized  Result Value Ref Range Status   Specimen Description URINE, CATHETERIZED  Final    Special Requests NONE  Final   Culture   Final    NO GROWTH Performed at [facility], [address] [place], [place] [address]    Report Status 06/26/2020 FINAL  Final  Respiratory Panel by RT PCR (Flu A&B, Covid) - Nasopharyngeal Swab     Status: None   Collection Time: 06/25/20  4:00 PM   Specimen: Nasopharyngeal Swab  Result Value Ref Range Status   SARS Coronavirus 2 by RT PCR NEGATIVE NEGATIVE Final    Comment: (NOTE) SARS-CoV-2 target nucleic acids are NOT DETECTED.  The SARS-CoV-2 RNA is generally detectable in upper respiratoy specimens during the acute phase of infection. The lowest concentration of SARS-CoV-2 viral copies this assay can detect is 131 copies/mL. A negative result does not preclude SARS-Cov-2 infection and should not be used as the sole basis for treatment or other patient management decisions. A negative result may occur with  improper specimen collection/handling, submission of specimen other than nasopharyngeal swab, presence of viral mutation(s) within the areas targeted by this assay, and inadequate number of viral copies (<131 copies/mL). A negative result must be combined with clinical observations, patient history, and epidemiological information. The expected result is Negative.  Fact Sheet for Patients:  PinkCheek.be  Fact Sheet for Healthcare Providers:  GravelBags.it  This test is no t yet approved or cleared by the [place] FDA and  has been authorized for detection and/or diagnosis of SARS-CoV-2 by FDA under an Emergency Use Authorization (EUA). This EUA will remain  in effect (meaning this test can be used) for the duration of the COVID-19 declaration under Section 564(b)(1) of the Act, 21 U.S.C. section 360bbb-3(b)(1), unless the authorization is terminated or revoked sooner.     Influenza A by PCR NEGATIVE NEGATIVE Final   Influenza B by PCR NEGATIVE NEGATIVE  Final    Comment: (NOTE) The Xpert Xpress SARS-CoV-2/FLU/RSV assay is intended as an aid in  the diagnosis of influenza from Nasopharyngeal swab specimens and  should not be used as a sole basis for treatment. Nasal washings and  aspirates are unacceptable for Xpert Xpress SARS-CoV-2/FLU/RSV  testing.  Fact Sheet for Patients: PinkCheek.be  Fact Sheet for Healthcare Providers: GravelBags.it  This test is not yet approved or cleared by the [place] FDA and  has been authorized for detection and/or diagnosis of SARS-CoV-2 by  FDA under an Emergency Use Authorization (EUA). This EUA will remain  in effect (meaning this test can be used) for the duration of the  Covid-19 declaration under Section 564(b)(1) of the Act, 21  U.S.C. section 360bbb-3(b)(1), unless the authorization is  terminated or revoked. Performed  at [facility], [place] [address] [place], [place] [address]   SARS CORONAVIRUS 2 (TAT 6-24 HRS) Nasopharyngeal Nasopharyngeal Swab     Status: None   Collection Time: 06/26/20 11:40 AM   Specimen: Nasopharyngeal Swab  Result Value Ref Range Status   SARS Coronavirus 2 NEGATIVE NEGATIVE Final    Comment: (NOTE) SARS-CoV-2 target nucleic acids are NOT DETECTED.  The SARS-CoV-2 RNA is generally detectable in upper and lower respiratory specimens during the acute phase of infection. Negative results do not preclude SARS-CoV-2 infection, do not rule out co-infections with other pathogens, and should not be used as the sole basis for treatment or other patient management decisions. Negative results must be combined with clinical observations, patient history, and epidemiological information. The expected result is Negative.  Fact Sheet for Patients: SugarRoll.be  Fact Sheet for Healthcare Providers: https://www.woods-mathews.com/  This test is not yet approved or  cleared by the [place] FDA and  has been authorized for detection and/or diagnosis of SARS-CoV-2 by FDA under an Emergency Use Authorization (EUA). This EUA will remain  in effect (meaning this test can be used) for the duration of the COVID-19 declaration under Se ction 564(b)(1) of the Act, 21 U.S.C. section 360bbb-3(b)(1), unless the authorization is terminated or revoked sooner.  Performed at [facility], [place] [address], [place], [place] [address]   Gram stain     Status: None   Collection Time: 06/27/20 11:40 AM   Specimen: PATH Cytology Pleural fluid  Result Value Ref Range Status   Specimen Description PLEURAL FLUID  Final   Special Requests NONE  Final   Gram Stain   Final    WBC PRESENT,BOTH PMN AND MONONUCLEAR NO ORGANISMS SEEN CYTOSPIN SMEAR Performed at [facility], [place] [address] [place], [place] [address]    Report Status 06/27/2020 FINAL  Final  Culture, body fluid-bottle     Status: None   Collection Time: 06/27/20 11:40 AM   Specimen: Pleura  Result Value Ref Range Status   Specimen Description PLEURAL FLUID  Final   Special Requests NONE  Final   Culture   Final    NO GROWTH 5 DAYS Performed at [place] [address], [place], [place] [address]    Report Status 07/02/2020 FINAL  Final  Culture, body fluid-bottle     Status: None   Collection Time: 06/28/20 10:53 AM   Specimen: Pleura  Result Value Ref Range Status   Specimen Description PLEURAL FLUID  Final   Special Requests LEFT LUNG  Final   Culture   Final    NO GROWTH 5 DAYS Performed at [place] [address], [place], [place] [address]    Report Status 07/03/2020 FINAL  Final  Gram stain     Status: None   Collection Time: 06/28/20 10:53 AM   Specimen: Pleura  Result Value Ref Range Status   Specimen Description PLEURAL FLUID  Final   Special Requests LEFT LUNG  Final   Gram Stain   Final    WBC PRESENT,BOTH PMN AND MONONUCLEAR NO ORGANISMS  SEEN CYTOSPIN SMEAR Performed at [facility], [address], [place], [place] [address]    Report Status 06/28/2020 FINAL  Final     Radiology Studies: No results found.   LOS: 9 days   Antonieta Pert, MD [facility]  07/04/2020, 2:34 PM

## 2020-07-04 NOTE — Progress Notes (Addendum)
Inpatient Rehabilitation Admissions Coordinator  I contacted pt's brother by phone. I explained that if CIR out of network benefits for CIR admit could not be worked out, that he and his brother would need to discuss their next rehab venue. Brother states that he would then have patient d/c to his home with follow up [place] needed. Acute team and TOC made aware.  Danne Baxter, RN, MSN Rehab Admissions Coordinator [phone] 07/04/2020 1:27 PM

## 2020-07-04 NOTE — Progress Notes (Signed)
Brought Patient to MRI. Patient refusing scan wanting more meds for claustrophobia than what has already been given. Called RN to see if anymore medication was available. RN stated that nothing else was available and to send patient back if he was refusing scan. Patient sent back to unit.

## 2020-07-05 ENCOUNTER — Encounter (HOSPITAL_COMMUNITY): Payer: Self-pay | Admitting: Physical Medicine & Rehabilitation

## 2020-07-05 ENCOUNTER — Inpatient Hospital Stay (HOSPITAL_COMMUNITY): Payer: PRIVATE HEALTH INSURANCE

## 2020-07-05 ENCOUNTER — Inpatient Hospital Stay (HOSPITAL_COMMUNITY)
Admission: RE | Admit: 2020-07-05 | Discharge: 2020-07-12 | DRG: 945 | Disposition: A | Discharge status: Other Acute Inpatient Hospital | Payer: PRIVATE HEALTH INSURANCE | Source: Intra-hospital | Attending: Physical Medicine & Rehabilitation | Admitting: Physical Medicine & Rehabilitation

## 2020-07-05 ENCOUNTER — Telehealth: Payer: Self-pay | Admitting: Oncology

## 2020-07-05 ENCOUNTER — Other Ambulatory Visit: Payer: Self-pay

## 2020-07-05 DIAGNOSIS — Z8042 Family history of malignant neoplasm of prostate: Secondary | ICD-10-CM

## 2020-07-05 DIAGNOSIS — R0602 Shortness of breath: Secondary | ICD-10-CM

## 2020-07-05 DIAGNOSIS — C7951 Secondary malignant neoplasm of bone: Secondary | ICD-10-CM | POA: Diagnosis not present

## 2020-07-05 DIAGNOSIS — G8194 Hemiplegia, unspecified affecting left nondominant side: Secondary | ICD-10-CM | POA: Diagnosis present

## 2020-07-05 DIAGNOSIS — R600 Localized edema: Secondary | ICD-10-CM

## 2020-07-05 DIAGNOSIS — R531 Weakness: Secondary | ICD-10-CM

## 2020-07-05 DIAGNOSIS — I5033 Acute on chronic diastolic (congestive) heart failure: Secondary | ICD-10-CM | POA: Diagnosis present

## 2020-07-05 DIAGNOSIS — K5900 Constipation, unspecified: Secondary | ICD-10-CM | POA: Diagnosis present

## 2020-07-05 DIAGNOSIS — N179 Acute kidney failure, unspecified: Secondary | ICD-10-CM | POA: Diagnosis present

## 2020-07-05 DIAGNOSIS — R5381 Other malaise: Principal | ICD-10-CM | POA: Diagnosis present

## 2020-07-05 DIAGNOSIS — Q211 Atrial septal defect: Secondary | ICD-10-CM

## 2020-07-05 DIAGNOSIS — I361 Nonrheumatic tricuspid (valve) insufficiency: Secondary | ICD-10-CM | POA: Diagnosis not present

## 2020-07-05 DIAGNOSIS — Z7901 Long term (current) use of anticoagulants: Secondary | ICD-10-CM | POA: Diagnosis not present

## 2020-07-05 DIAGNOSIS — M75101 Unspecified rotator cuff tear or rupture of right shoulder, not specified as traumatic: Secondary | ICD-10-CM | POA: Diagnosis present

## 2020-07-05 DIAGNOSIS — Z79899 Other long term (current) drug therapy: Secondary | ICD-10-CM | POA: Diagnosis not present

## 2020-07-05 DIAGNOSIS — I50812 Chronic right heart failure: Secondary | ICD-10-CM | POA: Diagnosis not present

## 2020-07-05 DIAGNOSIS — E8779 Other fluid overload: Secondary | ICD-10-CM | POA: Diagnosis not present

## 2020-07-05 DIAGNOSIS — Z79890 Hormone replacement therapy (postmenopausal): Secondary | ICD-10-CM | POA: Diagnosis not present

## 2020-07-05 DIAGNOSIS — I4891 Unspecified atrial fibrillation: Secondary | ICD-10-CM | POA: Diagnosis not present

## 2020-07-05 DIAGNOSIS — M4712 Other spondylosis with myelopathy, cervical region: Secondary | ICD-10-CM | POA: Diagnosis present

## 2020-07-05 DIAGNOSIS — R001 Bradycardia, unspecified: Secondary | ICD-10-CM | POA: Diagnosis not present

## 2020-07-05 DIAGNOSIS — D638 Anemia in other chronic diseases classified elsewhere: Secondary | ICD-10-CM | POA: Diagnosis present

## 2020-07-05 DIAGNOSIS — E0781 Sick-euthyroid syndrome: Secondary | ICD-10-CM | POA: Diagnosis present

## 2020-07-05 DIAGNOSIS — D649 Anemia, unspecified: Secondary | ICD-10-CM

## 2020-07-05 DIAGNOSIS — I9589 Other hypotension: Secondary | ICD-10-CM | POA: Diagnosis not present

## 2020-07-05 DIAGNOSIS — E038 Other specified hypothyroidism: Secondary | ICD-10-CM | POA: Diagnosis not present

## 2020-07-05 DIAGNOSIS — J90 Pleural effusion, not elsewhere classified: Secondary | ICD-10-CM | POA: Diagnosis not present

## 2020-07-05 DIAGNOSIS — E039 Hypothyroidism, unspecified: Secondary | ICD-10-CM | POA: Diagnosis present

## 2020-07-05 DIAGNOSIS — I4821 Permanent atrial fibrillation: Secondary | ICD-10-CM | POA: Diagnosis present

## 2020-07-05 DIAGNOSIS — G893 Neoplasm related pain (acute) (chronic): Secondary | ICD-10-CM

## 2020-07-05 DIAGNOSIS — D630 Anemia in neoplastic disease: Secondary | ICD-10-CM | POA: Diagnosis present

## 2020-07-05 DIAGNOSIS — E8809 Other disorders of plasma-protein metabolism, not elsewhere classified: Secondary | ICD-10-CM | POA: Diagnosis not present

## 2020-07-05 DIAGNOSIS — R29898 Other symptoms and signs involving the musculoskeletal system: Secondary | ICD-10-CM

## 2020-07-05 DIAGNOSIS — E8770 Fluid overload, unspecified: Secondary | ICD-10-CM | POA: Diagnosis not present

## 2020-07-05 DIAGNOSIS — G959 Disease of spinal cord, unspecified: Secondary | ICD-10-CM | POA: Diagnosis present

## 2020-07-05 DIAGNOSIS — M7989 Other specified soft tissue disorders: Secondary | ICD-10-CM | POA: Diagnosis not present

## 2020-07-05 DIAGNOSIS — R609 Edema, unspecified: Secondary | ICD-10-CM

## 2020-07-05 DIAGNOSIS — N139 Obstructive and reflux uropathy, unspecified: Secondary | ICD-10-CM | POA: Diagnosis present

## 2020-07-05 DIAGNOSIS — C61 Malignant neoplasm of prostate: Secondary | ICD-10-CM | POA: Diagnosis not present

## 2020-07-05 DIAGNOSIS — I272 Pulmonary hypertension, unspecified: Secondary | ICD-10-CM | POA: Diagnosis present

## 2020-07-05 DIAGNOSIS — I4819 Other persistent atrial fibrillation: Secondary | ICD-10-CM | POA: Diagnosis not present

## 2020-07-05 HISTORY — DX: Hypothyroidism, unspecified: E03.9

## 2020-07-05 MED ORDER — LEVOTHYROXINE SODIUM 25 MCG PO TABS
25.0000 ug | ORAL_TABLET | Freq: Every day | ORAL | Status: DC
  Administered 2020-07-06 – 2020-07-12 (×7): 25 ug via ORAL
  Filled 2020-07-05 (×7): qty 1

## 2020-07-05 MED ORDER — METOPROLOL SUCCINATE ER 50 MG PO TB24
100.0000 mg | ORAL_TABLET | Freq: Two times a day (BID) | ORAL | Status: DC
  Administered 2020-07-05 – 2020-07-12 (×13): 100 mg via ORAL
  Filled 2020-07-05 (×15): qty 2

## 2020-07-05 MED ORDER — POLYETHYLENE GLYCOL 3350 17 G PO PACK: 17.0000 g | PACK | Freq: Every day | ORAL | Status: DC | PRN

## 2020-07-05 MED ORDER — BICALUTAMIDE 50 MG PO TABS
50.0000 mg | ORAL_TABLET | Freq: Every day | ORAL | Status: DC
  Administered 2020-07-06 – 2020-07-12 (×7): 50 mg via ORAL
  Filled 2020-07-05 (×8): qty 1

## 2020-07-05 MED ORDER — LORAZEPAM 2 MG/ML IJ SOLN
2.0000 mg | Freq: Once | INTRAMUSCULAR | Status: AC | PRN
  Administered 2020-07-05: 2 mg via INTRAVENOUS
  Filled 2020-07-05: qty 1

## 2020-07-05 MED ORDER — DILTIAZEM HCL 30 MG PO TABS
30.0000 mg | ORAL_TABLET | Freq: Three times a day (TID) | ORAL | Status: DC
Start: 2020-07-05 — End: 2020-07-11

## 2020-07-05 MED ORDER — LORAZEPAM BOLUS VIA INFUSION: 2.0000 mg | INTRAVENOUS | Status: DC | PRN

## 2020-07-05 MED ORDER — ACETAMINOPHEN 325 MG PO TABS: 650.0000 mg | ORAL_TABLET | ORAL | Status: DC | PRN

## 2020-07-05 MED ORDER — DIGOXIN 125 MCG PO TABS
0.2500 mg | ORAL_TABLET | Freq: Every day | ORAL | Status: DC
  Administered 2020-07-06 – 2020-07-09 (×4): 0.25 mg via ORAL
  Filled 2020-07-05 (×5): qty 2

## 2020-07-05 MED ORDER — APIXABAN 5 MG PO TABS
5.0000 mg | ORAL_TABLET | Freq: Two times a day (BID) | ORAL | Status: DC
  Administered 2020-07-05 – 2020-07-12 (×14): 5 mg via ORAL
  Filled 2020-07-05 (×14): qty 1

## 2020-07-05 MED ORDER — DILTIAZEM HCL 60 MG PO TABS
30.0000 mg | ORAL_TABLET | Freq: Three times a day (TID) | ORAL | Status: DC
  Administered 2020-07-05 – 2020-07-11 (×19): 30 mg via ORAL
  Filled 2020-07-05 (×21): qty 1

## 2020-07-05 MED ORDER — GADOBUTROL 1 MMOL/ML IV SOLN
7.0000 mL | Freq: Once | INTRAVENOUS | Status: AC | PRN
  Administered 2020-07-05: 7 mL via INTRAVENOUS

## 2020-07-05 NOTE — Progress Notes (Signed)
Inpatient Rehabilitation Medication Review by a Pharmacist  A complete drug regimen review was completed for this patient to identify any potential clinically significant medication issues.  Clinically significant medication issues were identified:  yes   Type of Medication Issue Identified Description of Issue Urgent (address now) Non-Urgent (address on AM team rounds) Plan Plan Accepted by Provider? (Yes / No / Pending AM Rounds)  Drug Interaction(s) (clinically significant)       Duplicate Therapy       Allergy       No Medication Administration End Date       Incorrect Dose       Additional Drug Therapy Needed       Other  PTA meds not resumed: Pantoprazole 40mg  PO daily Folic Acid 1mg  daily Docusate 100mg  BID prn Senna 8.6mg  1 tab BID prn Non-Urgent      Name of provider notified for urgent issues identified: n/a  Provider Method of Notification: n/a   For non-urgent medication issues to be resolved on team rounds tomorrow morning a [facility] Secure Chat Handoff was sent to:    Pharmacist comments:   Time spent performing this drug regimen review (minutes):  5   Chris Morales, Rocky Crafts 07/05/2020 9:55 PM

## 2020-07-05 NOTE — Discharge Summary (Signed)
Physician Discharge Summary  Cristan Scherzer DZH:[id number] DOB: February 15, 1956 DOA: 06/24/2020  PCP: Patient, No Pcp Per  Admit date: 06/24/2020 Discharge date: 07/05/2020  Admitted From: home Disposition:  CIR  Recommendations for Outpatient Follow-up:  1. Follow up with PCP in 1-2 weeks 2. Please obtain BMP/CBC in one week 3. Please follow up on the following pending results:  Home Health:no  Equipment/Devices: none  Discharge Condition: Stable Code Status:   Code Status: Full Code Diet recommendation:  Diet Order            Diet - low sodium heart healthy           Diet Heart Room service appropriate? 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  Was recently hospitalized mid [facility] 41/96-22/29 with complicated hospital course with A. fib RVR, respiratory failure needing intubation vent support, tried on multiple AV nodal blocking agents including amiodarone digoxin and finally on scheduled metoprolol therapy, attempts for chemical cardioversion failed, extubated. Post extubation patient realized he was suffering from left-sided weakness particularly left upper extremity.  He was frustrated as to the details of getting an MRI of his brain and decided to leave [place] morning of 10/25.  Then patient immediately jumped on a flight down to [facility] for his brother retired OB/GYN lives, after landing he proceeded to come to [facility] emergency department. In the ED found to have A. fib with RVR, chest x-ray with bilateral pleural effusion and diffuse metastatic lesions, left-sided weakness, elevated D-dimer-CT angio negative for PE, with severe shortness of breath respiratory  failure. Patient was treated with Cardizem bolus and infusion, received IV Lasix in the ED for acute hypoxic respiratory failure/distress complicated hospital course being treated for A. fib RVR, respiratory failure due to diastolic congestive heart failure, exudative bilateral pleural effusion likely malignant, left-sided weakness with MRI brain no infection but possible dural based metastasis versus meningioma, postobstructive uropathy, anemia, prostate cancer with metastasis.  Patient is followed by oncology/neuro-oncology At this time patient heart rate is stable on digoxin, diltiazem and metoprolol, anticoagulated on Eliquis PT OT following and has advised CIR and awaiting for placement. Patient underwent MRI C-spine with IV Ativan finally-shows a lot of postop changes chronic changes no acute finding discussed with Dr. Curly Shores and will need EMG/NCS in 3-4 wks.   Discharge Diagnoses:  Atrial fibrillation with rapid ventricular response: Recent extensive hospitalization.    Rate is stable.  Continue Cardizem 30 every 8 hours, digoxin 0.25 mg and metoprolol 100 mg along with Eliquis for anticoagulation.    Metastatic prostate cancer, history of C4-C5-C6 laminectomies for resection of previous dorsal epidural metastatic tumor.06/25/2020 CT chest angios shows bilateral effusions, extensive bony metastatic disease, brain MRI 10/26/210 shows possible dural based metastasis.  Seen by oncology here on Casodex started 10/31 and Lupron started 11/1.  He has tolerated well so far.  Noted oncology plan for outpatient follow-up with Dr. [name] for ongoing management of his metastatic prostate cancer- next appointment 11/17 and oncology is planning to repeat MRI scan in about 2 months which will be reviewed in tumor board. MRI C-spine shows diffuse osseous metastasis.  Acute hypoxic respiratory failure due to diastolic congestive heart failure/likely  exudative bilateral pleural effusions: Chest x-ray CT angio no  PE but with pleural effusion, echo shows EF 50 to 55% is large pleural effusion on left, moderate TR.  Underwent Rt thoracentesis 10/28, 1.6 L dark yellow fluid removed, pathology with reactive nasal cells,then s/p left thoracentesis 10/29 with 1.3 L bright yellow fluid.  Pleural fluid consistent with exudative effusion, pleural fluid culture no growth so far.  Chest x-ray 11/1 shows no significant recurrence of pleural effusion.doing well on room air currently.    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  His arm weakness is improving.  He had MRI C-spine attempted multiple times without success seen by neurology and had 2 mg IV Ativan and had MRI C-spine report as below, no acute finding currently and no need for inpatient intervention he will need EMG/NCS in 3 to 4 weeks to decide on prognosis.  Discussed with neurology today.  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 Foley cathete to continue.  Euthyroid sick syndrome/hypothyroidism continue Synthroid.  TSH 7.3. Thyroid function test in 4 to 6 weeks to adjust dose.  Normocytic anemia likely from chronic anemia of chronic disease/malignancy.  Transfuse for hemoglobin less than 7 g.s/p 1 unit PRBC 10/30, iron 25 TIBC 193 low ferritin high folate normal B12 633.  Monitor.  Hemoglobin overall stable.  Elevated d-dimer 19.3 no PE on the CT angio but patient may work CT abdomen pelvis revealed [place] liver concerning for portal vein thrombosis, ultrasound Doppler liver unremarkable.  Could be due to malignancy.He is on anticoagulation already and will be continued..    Goals of care, counseling/discussion:Full code, seen by palliative care.  IMAGING:  MRI C SPINE 07/05/20 Since the prior MRI of 02/20/2019, there has  been interval C4, C5 and C6 laminectomies for resection of previous dorsal epidural metastatic tumor. No evidence of recurrent epidural tumor at this site. No convincing evidence of epidural metastatic disease within the cervical spine.  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  Cervical spondylosis as outlined and having not significantly changed from the cervical spine MRI of 02/20/2020.  Severe disc degeneration throughout the majority of the cervical spine.  No more than mild spinal canal stenosis at any level. At C3-C4, a disc bulge contributes to mild relative spinal canal narrowing, contacting and minimally flattening the ventral spinal cord.  Multilevel neural foraminal narrowing greatest on the left at C3-C4 (moderate/severe), on the left at C4-C5 (moderate) and on the left at C6-C7 (moderate).  Consults:  Oncology, IR,cards,neurology  Subjective: Alert awake and oriented this morning,able to move all his extremities well with mild weakness in the left upper extremities.  Discharge Exam: Vitals:   07/05/20 0805 07/05/20 1121  BP: 105/74 99/66  Pulse: 62 61  Resp: 16 16  Temp: 98.2 F (36.8 C)   SpO2: 98% 98%   General: Pt is alert, awake, not in acute distress Cardiovascular: RRR, S1/S2 +, no rubs, no gallops Respiratory: CTA bilaterally, no wheezing, no rhonchi Abdominal: Soft, NT, ND, bowel sounds + Extremities: no edema, no cyanosis  Discharge Instructions  Discharge Instructions    Diet - low sodium heart healthy   Complete by: As directed    Discharge instructions   Complete by: As directed    Will need EMG/nerve conduction study in 3 to 4 weeks if you continue  to have weakness on the left arm  Please call call MD or return to ER for similar or worsening recurring problem that brought you to hospital or if any fever,nausea/vomiting,abdominal pain, uncontrolled pain, chest pain,  shortness of breath or  any other alarming symptoms.  Please follow-up your doctor as instructed in a week time and call the office for appointment.  Please avoid alcohol, smoking, or any other illicit substance and maintain healthy habits including taking your regular medications as prescribed.  You were cared for by a hospitalist during your hospital stay. If you have any questions about your discharge medications or the care you received while you were in the hospital after you are discharged, you can call the unit and ask to speak with the hospitalist on call if the hospitalist that took care of you is not available.  Once you are discharged, your primary care physician will handle any further medical issues. Please note that NO REFILLS for any discharge medications will be authorized once you are discharged, as it is imperative that you return to your primary care physician (or establish a relationship with a primary care physician if you do not have one) for your aftercare needs so that they can reassess your need for medications and monitor your lab values   Increase activity slowly   Complete by: As directed      Allergies as of 07/05/2020   No Known Allergies     Medication List    STOP taking these medications   HYDROcodone-acetaminophen 5-325 MG tablet Commonly known as: NORCO/VICODIN   tamsulosin 0.4 MG Caps capsule Commonly known as: FLOMAX     TAKE these medications   bicalutamide 50 MG tablet Commonly known as: CASODEX Take 50 mg by mouth daily.   digoxin 0.25 MG tablet Commonly known as: LANOXIN Take 0.25 mg by mouth daily.   diltiazem 30 MG tablet Commonly known as: CARDIZEM Take 1 tablet (30 mg total) by mouth every 8 (eight) hours.   docusate sodium 100 MG capsule Commonly known as: COLACE Take 100 mg by mouth 2 (two) times daily as needed (constipation).   Eliquis 5 MG Tabs tablet Generic drug: apixaban Take 5 mg by mouth 2 (two) times daily.   folic acid 1 MG  tablet Commonly known as: FOLVITE Take 1 mg by mouth daily.   levothyroxine 25 MCG tablet Commonly known as: SYNTHROID Take 25 mcg by mouth daily before breakfast.   metoprolol succinate 50 MG 24 hr tablet Commonly known as: TOPROL-XL Take 100 mg by mouth in the morning and at bedtime. Take with or immediately following a meal.   pantoprazole 40 MG tablet Commonly known as: PROTONIX Take 40 mg by mouth daily.   senna 8.6 MG Tabs tablet Commonly known as: SENOKOT Take 1 tablet by mouth 2 (two) times daily as needed (constipation).       Follow-up Information    Wyatt Portela, MD. Go on 07/17/2020.   Specialty: Oncology Why: 2pm Contact information: [place] [phone]              No Known Allergies  The results of significant diagnostics from this hospitalization (including imaging, microbiology, ancillary and laboratory) are listed below for reference.    Microbiology: Recent Results (from the past 240 hour(s))  Respiratory Panel by RT PCR (Flu A&B, Covid) - Nasopharyngeal Swab     Status: None   Collection Time: 06/25/20  4:00 PM   Specimen: Nasopharyngeal  Swab  Result Value Ref Range Status   SARS Coronavirus 2 by RT PCR NEGATIVE NEGATIVE Final    Comment: (NOTE) SARS-CoV-2 target nucleic acids are NOT DETECTED.  The SARS-CoV-2 RNA is generally detectable in upper respiratoy specimens during the acute phase of infection. The lowest concentration of SARS-CoV-2 viral copies this assay can detect is 131 copies/mL. A negative result does not preclude SARS-Cov-2 infection and should not be used as the sole basis for treatment or other patient management decisions. A negative result may occur with  improper specimen collection/handling, submission of specimen other than nasopharyngeal swab, presence of viral mutation(s) within the areas targeted by this assay, and inadequate number of viral copies (<131 copies/mL). A  negative result must be combined with clinical observations, patient history, and epidemiological information. The expected result is Negative.  Fact Sheet for Patients:  PinkCheek.be  Fact Sheet for Healthcare Providers:  GravelBags.it  This test is no t yet approved or cleared by the [place] FDA and  has been authorized for detection and/or diagnosis of SARS-CoV-2 by FDA under an Emergency Use Authorization (EUA). This EUA will remain  in effect (meaning this test can be used) for the duration of the COVID-19 declaration under Section 564(b)(1) of the Act, 21 U.S.C. section 360bbb-3(b)(1), unless the authorization is terminated or revoked sooner.     Influenza A by PCR NEGATIVE NEGATIVE Final   Influenza B by PCR NEGATIVE NEGATIVE Final    Comment: (NOTE) The Xpert Xpress SARS-CoV-2/FLU/RSV assay is intended as an aid in  the diagnosis of influenza from Nasopharyngeal swab specimens and  should not be used as a sole basis for treatment. Nasal washings and  aspirates are unacceptable for Xpert Xpress SARS-CoV-2/FLU/RSV  testing.  Fact Sheet for Patients: PinkCheek.be  Fact Sheet for Healthcare Providers: GravelBags.it  This test is not yet approved or cleared by the [place] FDA and  has been authorized for detection and/or diagnosis of SARS-CoV-2 by  FDA under an Emergency Use Authorization (EUA). This EUA will remain  in effect (meaning this test can be used) for the duration of the  Covid-19 declaration under Section 564(b)(1) of the Act, 21  U.S.C. section 360bbb-3(b)(1), unless the authorization is  terminated or revoked. Performed at [facility], [place] [address], [place], [place] [address]   SARS CORONAVIRUS 2 (TAT 6-24 HRS) Nasopharyngeal Nasopharyngeal Swab     Status: None   Collection Time: 06/26/20 11:40 AM   Specimen:  Nasopharyngeal Swab  Result Value Ref Range Status   SARS Coronavirus 2 NEGATIVE NEGATIVE Final    Comment: (NOTE) SARS-CoV-2 target nucleic acids are NOT DETECTED.  The SARS-CoV-2 RNA is generally detectable in upper and lower respiratory specimens during the acute phase of infection. Negative results do not preclude SARS-CoV-2 infection, do not rule out co-infections with other pathogens, and should not be used as the sole basis for treatment or other patient management decisions. Negative results must be combined with clinical observations, patient history, and epidemiological information. The expected result is Negative.  Fact Sheet for Patients: SugarRoll.be  Fact Sheet for Healthcare Providers: https://www.woods-mathews.com/  This test is not yet approved or cleared by the [place] FDA and  has been authorized for detection and/or diagnosis of SARS-CoV-2 by FDA under an Emergency Use Authorization (EUA). This EUA will remain  in effect (meaning this test can be used) for the duration of the COVID-19 declaration under Se ction 564(b)(1) of the Act, 21 U.S.C. section 360bbb-3(b)(1), unless  the authorization is terminated or revoked sooner.  Performed at [facility], [place] [address], [place], [place] [address]   Gram stain     Status: None   Collection Time: 06/27/20 11:40 AM   Specimen: PATH Cytology Pleural fluid  Result Value Ref Range Status   Specimen Description PLEURAL FLUID  Final   Special Requests NONE  Final   Gram Stain   Final    WBC PRESENT,BOTH PMN AND MONONUCLEAR NO ORGANISMS SEEN CYTOSPIN SMEAR Performed at [facility], [place] [address] [place], [place] [address]    Report Status 06/27/2020 FINAL  Final  Culture, body fluid-bottle     Status: None   Collection Time: 06/27/20 11:40 AM   Specimen: Pleura  Result Value Ref Range Status   Specimen Description PLEURAL FLUID  Final   Special  Requests NONE  Final   Culture   Final    NO GROWTH 5 DAYS Performed at [place] [address], [place], [place] [address]    Report Status 07/02/2020 FINAL  Final  Culture, body fluid-bottle     Status: None   Collection Time: 06/28/20 10:53 AM   Specimen: Pleura  Result Value Ref Range Status   Specimen Description PLEURAL FLUID  Final   Special Requests LEFT LUNG  Final   Culture   Final    NO GROWTH 5 DAYS Performed at [place] [address], [place], [place] [address]    Report Status 07/03/2020 FINAL  Final  Gram stain     Status: None   Collection Time: 06/28/20 10:53 AM   Specimen: Pleura  Result Value Ref Range Status   Specimen Description PLEURAL FLUID  Final   Special Requests LEFT LUNG  Final   Gram Stain   Final    WBC PRESENT,BOTH PMN AND MONONUCLEAR NO ORGANISMS SEEN CYTOSPIN SMEAR Performed at [facility], [address] [place], [place] [address]    Report Status 06/28/2020 FINAL  Final    Procedures/Studies: DG Chest 1 View  Result Date: 06/28/2020 CLINICAL DATA:  Left thoracentesis. EXAM: CHEST  1 VIEW COMPARISON:  06/28/2020. FINDINGS: Mediastinum and hilar structures normal. Stable cardiomegaly. Bilateral interstitial prominence again noted with interim improvement from prior exam. Findings suggests improving CHF. Improved left pleural effusion. Tiny right pleural effusion. No pneumothorax post thoracentesis. IMPRESSION: Findings consistent with improving CHF. Improved left pleural effusion. No pneumothorax post thoracentesis. Tiny right pleural effusion. Electronically Signed   By: Marcello Moores  Register   On: 06/28/2020 10:55   DG Chest 1 View  Result Date: 06/27/2020 CLINICAL DATA:  Post right thoracentesis. EXAM: CHEST  1 VIEW COMPARISON:  06/24/2020 FINDINGS: Interval improvement in right pleural effusion. No pneumothorax. Improved aeration right lung base Interval progression of left pleural effusion and left lower lobe  atelectasis Extensive sclerotic bony changes compatible with metastatic prostate cancer. IMPRESSION: Improved right pleural effusion with no pneumothorax post thoracentesis Progression of left effusion and left lower lobe atelectasis. Bony metastatic disease. Electronically Signed   By: Franchot Gallo M.D.   On: 06/27/2020 12:06   CT ANGIO CHEST PE W OR WO CONTRAST  Result Date: 06/25/2020 CLINICAL DATA:  Worsening shortness of breath EXAM: CT ANGIOGRAPHY CHEST WITH CONTRAST TECHNIQUE: Multidetector CT imaging of the chest was performed using the standard protocol during bolus administration of intravenous contrast. Multiplanar CT image reconstructions and MIPs were obtained to evaluate the vascular anatomy. CONTRAST:  18mL OMNIPAQUE IOHEXOL 350 MG/ML  SOLN COMPARISON:  Radiograph same day FINDINGS: Cardiovascular: There is a optimal opacification of the pulmonary arteries. There is no central,segmental, or subsegmental filling defects within the pulmonary arteries. There is mild cardiomegaly present. No pericardial effusion or thickening. No evidence right heart strain. There is normal three-vessel brachiocephalic anatomy without proximal stenosis. The thoracic aorta is normal in appearance. Mediastinum/Nodes: No hilar, mediastinal, or axillary adenopathy. Thyroid gland, trachea, and esophagus demonstrate no significant findings. Lungs/Pleura: There is moderate to large bilateral pleural effusion with adjacent compressive atelectasis seen. Patchy rounded ground-glass opacities are seen predominantly within the right upper lung and right middle lobe. Upper Abdomen: No acute abnormalities present in the visualized portions of the upper abdomen. There is diffuse anasarca present. Musculoskeletal: Extensive osseous sclerotic blastic lesions are seen throughout the visualized portion of the axial and appendicular skeleton. Review of the MIP images confirms the above findings. IMPRESSION: No central, segmental, or  subsegmental pulmonary embolism Moderate to large bilateral pleural effusions with adjacent compressive atelectasis. Ground-glass opacities within the right upper lung and right middle lobe, likely due to infectious or inflammatory etiology. Extensive diffuse osseous metastatic disease. Electronically Signed   By: Prudencio Pair M.D.   On: 06/25/2020 03:11   MR BRAIN W WO CONTRAST  Result Date: 06/25/2020 CLINICAL DATA:  Left-sided weakness, metastatic prostate cancer EXAM: MRI HEAD WITHOUT AND WITH CONTRAST TECHNIQUE: Multiplanar, multiecho pulse sequences of the brain and surrounding structures were obtained without and with intravenous contrast. CONTRAST:  45mL GADAVIST GADOBUTROL 1 MMOL/ML IV SOLN COMPARISON:  2020 FINDINGS: Brain: There is thickening and enhancement along the posterior falx measuring 5 mm in thickness. The adjacent superior sagittal sinus is uninvolved. There is new minimal adjacent T2 FLAIR hyperintensity in the right parietal lobe that may reflect associated edema. There is no acute infarction or intracranial hemorrhage. There is no parenchymal mass or mass effect. There is no hydrocephalus or extra-axial fluid collection. Prominence of the ventricles and sulci reflects minor generalized parenchymal volume loss. Patchy foci of T2 hyperintensity in the supratentorial white matter are nonspecific but probably reflect mild chronic microvascular ischemic changes. These findings have progressed since 2020. Vascular: Major vessel flow voids at the skull base are preserved. Skull and upper cervical spine: Abnormal marrow signal likely reflecting diffuse metastatic disease. Sinuses/Orbits: Chronic right maxillary sinusitis. Orbits are unremarkable. Other: Sella is unremarkable.  Bilateral mastoid effusions. IMPRESSION: No acute infarction. Thickening enhancement along the posterior falx cerebri with possible minimal associated parenchymal edema. Differential considerations include dural-based  metastasis (more likely given normal appearance on prior study) and meningioma. Diffuse osseous metastatic disease. Electronically Signed   By: Macy Mis M.D.   On: 06/25/2020 08:24   MR CERVICAL SPINE W WO CONTRAST  Addendum Date: 07/05/2020   ADDENDUM REPORT: 07/05/2020 13:24 ADDENDUM: I was subsequently contacted by Dr. Curly Shores of neurology who directed my attention to subtle dural thickening and enhancement at the C5-C6 level. The dura measures 1-2 mm in thickness at this level. This finding may be postoperative in etiology. Trace dural-based tumor is difficult to definitively exclude. Consider contrast-enhanced MRI follow-up for surveillance. No resultant significant spinal cord mass effect at this level. This addendum was discussed with Dr. Curly Shores by telephone at approximately 1:30 p.m. on 07/05/2020. Electronically Signed   By: Kellie Simmering DO   On: 07/05/2020 13:24   Result Date: 07/05/2020 CLINICAL DATA:  Myelopathy, acute or progressive; left arm weakness. Additional history provided: History of prostate cancer metastatic to bone, status post cervical laminectomy with  tumor removal in June 2020. EXAM: MRI CERVICAL SPINE WITHOUT AND WITH CONTRAST TECHNIQUE: Multiplanar and multiecho pulse sequences of the cervical spine, to include the craniocervical junction and cervicothoracic junction, were obtained without and with intravenous contrast. CONTRAST:  63mL GADAVIST GADOBUTROL 1 MMOL/ML IV SOLN COMPARISON:  Radiographs of the cervical spine 02/20/2019. Cervical spine MRI 02/19/2019. Cervical spine MRI 02/20/2019. FINDINGS: Alignment: Reversal of the expected cervical lordosis. No significant spondylolisthesis. Vertebrae: Vertebral body height is maintained. Diffuse metastatic disease throughout the cervical spine. Since the prior MRI of 02/20/2019, there has been interval C4, C5 and C6 laminectomies for resection of epidural metastatic tumor and microdiscectomy. Multilevel degenerative endplate  irregularity. Cord: No spinal cord signal abnormality. Posterior Fossa, vertebral arteries, paraspinal tissues: No abnormality identified within included portions of the posterior fossa. Bilateral mastoid effusions. Flow voids preserved within the imaged cervical vertebral arteries. Paraspinal soft tissues within normal limits. Unchanged nonspecific 13 mm round T2 hyperintense subcutaneous lesion within the posterior right upper back (series 8, image 36). Disc levels: Unless otherwise stated, the level by level findings below have not significantly changed since prior MRIs of 02/20/2019 and 02/19/2019. Severe disc degeneration at C3-C4, C4-C5, C5-C6, C6-C7 and C7-T1. C2-C3: No significant disc herniation or stenosis. C3-C4: Disc bulge. Uncovertebral and facet hypertrophy. The disc bulge effaces the ventral thecal sac, contacting and minimally flattening the ventral spinal cord with overall mild relative spinal canal narrowing. Moderate/severe left neural foraminal narrowing. C4-C5: Interval laminectomies for resection of previous dorsal epidural tumor. Posterior disc osteophyte complex. Uncinate and facet hypertrophy. No significant spinal canal stenosis. Bilateral neural foraminal narrowing (mild right, moderate left). C5-C6: Interval laminectomies for resection of previous dorsal epidural tumor. Posterior disc osteophyte complex. Uncinate and facet hypertrophy. The disc osteophyte complex minimally effaces the ventral thecal sac without significant spinal canal stenosis. Mild/moderate bilateral neural foraminal narrowing. C6-C7: Posterior disc osteophyte complex. Uncinate and facet hypertrophy. Mild spinal canal stenosis. Bilateral neural foraminal narrowing (mild right, moderate left). C7-T1: This level is imaged sagittally. No significant spinal canal stenosis. Mild bilateral neural foraminal narrowing. IMPRESSION: Since the prior MRI of 02/20/2019, there has been interval C4, C5 and C6 laminectomies for  resection of previous dorsal epidural metastatic tumor. No evidence of recurrent epidural tumor at this site. No convincing evidence of epidural metastatic disease within the cervical spine. 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 Cervical spondylosis as outlined and having not significantly changed from the cervical spine MRI of 02/20/2020. Severe disc degeneration throughout the majority of the cervical spine. No more than mild spinal canal stenosis at any level. At C3-C4, a disc bulge contributes to mild relative spinal canal narrowing, contacting and minimally flattening the ventral spinal cord. Multilevel neural foraminal narrowing greatest on the left at C3-C4 (moderate/severe), on the left at C4-C5 (moderate) and on the left at C6-C7 (moderate). 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 EXAM: PORTABLE CHEST 1 VIEW COMPARISON:  06/28/2020 FINDINGS: The heart size and mediastinal contours are within normal limits. No significant recurrence of left pleural fluid since thoracentesis with probable small pleural effusion remaining. No pneumothorax. Stable bibasilar atelectasis. Stable diffuse sclerotic metastatic disease involving the thoracic spine. IMPRESSION: No significant recurrence of left pleural fluid since thoracentesis with probable small pleural effusion remaining. Stable bibasilar atelectasis. Electronically Signed   By: Jenness Corner.D.  On: 07/01/2020 08:03   DG CHEST PORT 1 VIEW  Result Date: 06/28/2020 CLINICAL DATA:  Shortness of breath. EXAM: PORTABLE CHEST 1 VIEW COMPARISON:  06/27/2020. FINDINGS: Mediastinum and heart size normal. Progression of diffuse left lung interstitial infiltrates/edema. Diffuse right lung interstitial infiltrates/edema noted on today's  exam. Persistent left-sided pleural effusion without interim change. No pneumothorax. Extensive sclerotic bony metastatic disease again noted. IMPRESSION: 1. Progression of diffuse left lung interstitial infiltrates/edema. Diffuse right lung interstitial infiltrates/edema noted on today's exam. Persistent left-sided pleural effusion without interim change. 2. Extensive sclerotic bony metastatic disease again noted. Electronically Signed   By: Baker   On: 06/28/2020 05:52   DG Chest Portable 1 View  Result Date: 06/24/2020 CLINICAL DATA:  Tachycardia. EXAM: PORTABLE CHEST 1 VIEW COMPARISON:  February 19, 2019 FINDINGS: There is cardiomegaly. There are moderate to large bilateral pleural effusions. There is bibasilar atelectasis. There is diffuse sclerosis throughout the visualized osseous structures most notably the thoracic spine. There is no pneumothorax. IMPRESSION: 1. Moderate to large bilateral pleural effusions with adjacent atelectasis. 2. Cardiomegaly. 3. Progressive sclerosis throughout the visualized osseous structures consistent with metastatic disease. Electronically Signed   By: Constance Holster M.D.   On: 06/24/2020 21:33   US LIVER DOPPLER  Result Date: 06/25/2020 CLINICAL DATA:  64 year old male with a history of liver disease EXAM: DUPLEX ULTRASOUND OF LIVER TECHNIQUE: Color and duplex Doppler ultrasound was performed to evaluate the hepatic in-flow and out-flow vessels. COMPARISON:  None. FINDINGS: Portal Vein Velocities Main:  43 cm/sec Right:  28 cm/sec Left:  18 cm/sec Hepatic Vein Velocities Right:  77 cm/sec Middle:  78 cm/sec Left:  92 cm/sec Hepatic Artery Velocity:  135 cm/sec Splenic Vein Velocity:  24 cm/sec Varices: Absent Ascites: Absent Spleen volume estimated 52 cubic cm Pleural fluid bilaterally IMPRESSION: Unremarkable duplex of the hepatic vasculature. Bilateral pleural effusions Electronically Signed   By: Corrie Mckusick D.O.   On: 06/25/2020 14:47    ECHOCARDIOGRAM COMPLETE BUBBLE STUDY  Result Date: 06/25/2020    ECHOCARDIOGRAM REPORT   Patient Name:   TYREKE KAESER Date of Exam: 06/25/2020 Medical Rec #:  [id number]    Height:       73.0 in Accession #:    [id number]   Weight:       150.0 lb Date of Birth:  [birth year]    BSA:          1.904 m Patient Age:    14 years     BP:           111/75 mmHg Patient Gender: M            HR:           126 bpm. Exam Location:  Inpatient Procedure: 2D Echo, Cardiac Doppler, Color Doppler and Saline Contrast Bubble            Study Indications:    Atrial fibrillation with rapid ventricular response ([place])  History:        Patient has no prior history of Echocardiogram examinations.  Sonographer:    Bernadene Person RDCS Referring Phys: [id number] [place]  1. Left ventricular ejection fraction, by estimation, is 50 to 55%. The left ventricle has low normal function. The left ventricle has no regional wall motion abnormalities. Left ventricular diastolic parameters were normal. There is the interventricular septum is flattened in diastole ('D' shaped left ventricle), consistent with right ventricular volume overload.  2. Right ventricular systolic function is mildly reduced.  The right ventricular size is mildly enlarged. There is mildly elevated pulmonary artery systolic pressure. The estimated right ventricular systolic pressure is 22.9 mmHg.  3. Left atrial size was mildly dilated.  4. Right atrial size was mildly dilated.  5. Large pleural effusion in the left lateral region.  6. The mitral valve is normal in structure. Mild mitral valve regurgitation.  7. Tricuspid valve regurgitation is mild to moderate.  8. The aortic valve is normal in structure. Aortic valve regurgitation is not visualized. Mild aortic valve sclerosis is present, with no evidence of aortic valve stenosis.  9. The inferior vena cava is dilated in size with <50% respiratory variability, suggesting right atrial pressure of 15 mmHg. 10.  Evidence of atrial level shunting detected by color flow Doppler. Agitated saline contrast bubble study was positive with shunting observed within 3-6 cardiac cycles suggestive of interatrial shunt. Comparison(s): No prior Echocardiogram. Findings are suggestive of right heart volume overload due to left-to-right shunt and there is a small right to left interatrial shunt by saline contrast study. However, an atrial septal defect could not be outlined with 2D and color Doppler imaging. Consider TEE or CT angiography to evaluate for anormalous pulmonary vein return or occult ASD. Qp:Qs shunt fraction could not be accurately calculated due to poor quality RV outflow imaging. FINDINGS  Left Ventricle: Left ventricular ejection fraction, by estimation, is 50 to 55%. The left ventricle has low normal function. The left ventricle has no regional wall motion abnormalities. The left ventricular internal cavity size was normal in size. There is no left ventricular hypertrophy. The interventricular septum is flattened in diastole ('D' shaped left ventricle), consistent with right ventricular volume overload. Left ventricular diastolic parameters were normal. Normal left ventricular filling pressure. Right Ventricle: The right ventricular size is mildly enlarged. No increase in right ventricular wall thickness. Right ventricular systolic function is mildly reduced. There is mildly elevated pulmonary artery systolic pressure. The tricuspid regurgitant  velocity is 2.60 m/s, and with an assumed right atrial pressure of 15 mmHg, the estimated right ventricular systolic pressure is 79.8 mmHg. Left Atrium: Left atrial size was mildly dilated. Right Atrium: Right atrial size was mildly dilated. Pericardium: There is no evidence of pericardial effusion. Mitral Valve: The mitral valve is normal in structure. Mild mitral valve regurgitation. Tricuspid Valve: The tricuspid valve is grossly normal. Tricuspid valve regurgitation is mild to  moderate. Aortic Valve: The aortic valve is normal in structure. Aortic valve regurgitation is not visualized. Mild aortic valve sclerosis is present, with no evidence of aortic valve stenosis. Pulmonic Valve: The pulmonic valve was not well visualized. Pulmonic valve regurgitation is not visualized. Aorta: The aortic root and ascending aorta are structurally normal, with no evidence of dilitation. Venous: The inferior vena cava is dilated in size with less than 50% respiratory variability, suggesting right atrial pressure of 15 mmHg. IAS/Shunts: There is redundancy of the interatrial septum. Evidence of atrial level shunting detected by color flow Doppler. Agitated saline contrast was given intravenously to evaluate for intracardiac shunting. Agitated saline contrast bubble study was  positive with shunting observed within 3-6 cardiac cycles suggestive of interatrial shunt. Additional Comments: There is a large pleural effusion in the left lateral region.  LEFT VENTRICLE PLAX 2D LVIDd:         4.10 cm     Diastology LVIDs:         3.00 cm     LV e' medial:    13.40 cm/s LV PW:  1.00 cm     LV E/e' medial:  6.5 LV IVS:        0.90 cm     LV e' lateral:   9.00 cm/s LVOT diam:     2.10 cm     LV E/e' lateral: 9.7 LV SV:         68 LV SV Index:   36 LVOT Area:     3.46 cm  LV Volumes (MOD) LV vol d, MOD A2C: 63.8 ml LV vol d, MOD A4C: 58.6 ml LV vol s, MOD A2C: 32.5 ml LV vol s, MOD A4C: 27.6 ml LV SV MOD A2C:     31.3 ml LV SV MOD A4C:     58.6 ml LV SV MOD BP:      32.7 ml RIGHT VENTRICLE RV S prime:     6.85 cm/s TAPSE (M-mode): 1.7 cm LEFT ATRIUM             Index       RIGHT ATRIUM           Index LA diam:        4.00 cm 2.10 cm/m  RA Area:     21.40 cm LA Vol (A2C):   37.5 ml 19.70 ml/m RA Volume:   61.60 ml  32.35 ml/m LA Vol (A4C):   53.1 ml 27.89 ml/m LA Biplane Vol: 44.8 ml 23.53 ml/m  AORTIC VALVE             PULMONIC VALVE LVOT Vmax:   89.40 cm/s  RVOT Peak grad: 2 mmHg LVOT Vmean:  65.500  cm/s LVOT VTI:    0.197 m  AORTA Ao Root diam: 2.70 cm Ao Asc diam:  2.90 cm MITRAL VALVE               TRICUSPID VALVE MV Area (PHT): 3.77 cm    TR Peak grad:   27.0 mmHg MV Decel Time: 201 msec    TR Vmax:        260.00 cm/s MV E velocity: 87.50 cm/s MV A velocity: 90.80 cm/s  SHUNTS MV E/A ratio:  0.96        Systemic VTI:  0.20 m                            Systemic Diam: 2.10 cm                            Pulmonic VTI:  0.087 m Dani Gobble Croitoru MD Electronically signed by Sanda Klein MD Signature Date/Time: 06/25/2020/12:54:37 PM    Final    IR THORACENTESIS ASP PLEURAL SPACE W/IMG GUIDE  Result Date: 06/28/2020 INDICATION: Patient with a history of prostate cancer with extensive metastases now has new onset pleural effusions. Interventional radiology asked to perform a therapeutic and diagnostic thoracentesis. EXAM: ULTRASOUND GUIDED THORACENTESIS MEDICATIONS: 1% lidocaine 10 mL COMPLICATIONS: None immediate. PROCEDURE: An ultrasound guided thoracentesis was thoroughly discussed with the patient and questions answered. The benefits, risks, alternatives and complications were also discussed. The patient understands and wishes to proceed with the procedure. Written consent was obtained. Ultrasound was performed to localize and mark an adequate pocket of fluid in the left chest. The area was then prepped and draped in the normal sterile fashion. 1% Lidocaine was used for local anesthesia. Under ultrasound guidance a 6 Fr Safe-T-Centesis catheter was introduced. Thoracentesis was performed. The catheter was removed  and a dressing applied. FINDINGS: A total of approximately 1.3 L of bright yellow fluid was removed. Samples were sent to the laboratory as requested by the clinical team. IMPRESSION: Successful ultrasound guided left thoracentesis yielding 1.3 L of pleural fluid. Read by: Soyla Dryer, NP Electronically Signed   By: Corrie Mckusick D.O.   On: 06/28/2020 12:11   IR THORACENTESIS ASP PLEURAL  SPACE W/IMG GUIDE  Result Date: 06/27/2020 INDICATION: Patient with a history of prostate cancer with extensive metastases now has new onset pleural effusions. Interventional radiology asked to perform a therapeutic and diagnostic thoracentesis. EXAM: ULTRASOUND GUIDED THORACENTESIS MEDICATIONS: 1% lidocaine 20 mL COMPLICATIONS: None immediate. PROCEDURE: An ultrasound guided thoracentesis was thoroughly discussed with the patient and questions answered. The benefits, risks, alternatives and complications were also discussed. The patient understands and wishes to proceed with the procedure. Written consent was obtained. Ultrasound was performed to localize and mark an adequate pocket of fluid in the right chest. The area was then prepped and draped in the normal sterile fashion. 1% Lidocaine was used for local anesthesia. Under ultrasound guidance a 6 Fr Safe-T-Centesis catheter was introduced. Thoracentesis was performed. The catheter was removed and a dressing applied. FINDINGS: A total of approximately 1.6 L of dark yellow fluid was removed. Samples were sent to the laboratory as requested by the clinical team. IMPRESSION: Successful ultrasound guided right thoracentesis yielding 1.6 L of pleural fluid. Read by: Soyla Dryer, NP Electronically Signed   By: Markus Daft M.D.   On: 06/27/2020 12:29    Labs: BNP (last 3 results) Recent Labs    06/24/20 1852  BNP 270.6*   Basic Metabolic Panel: Recent Labs  Lab 06/29/20 0408 06/30/20 0448 07/02/20 0225 07/03/20 0147  NA 139 141 139 141  K 4.1 4.0 3.8 4.0  CL 109 112* 108 109  CO2 21* 22 23 22   GLUCOSE 87 94 114* 86  BUN 15 13 15 15   CREATININE 0.62 0.53* 0.59* 0.51*  CALCIUM 7.3* 7.4* 7.7* 7.9*  MG 1.9 1.9 2.1 2.1   Liver Function Tests: No results for input(s): AST, ALT, ALKPHOS, BILITOT, PROT, ALBUMIN in the last 168 hours. No results for input(s): LIPASE, AMYLASE in the last 168 hours. No results for input(s): AMMONIA in the last  168 hours. CBC: Recent Labs  Lab 06/29/20 0408 06/30/20 0448 07/02/20 0225 07/03/20 0147  WBC 8.3 7.5 6.2 5.4  HGB 7.2* 8.3* 8.0* 8.4*  HCT 24.7* 27.4* 27.3* 28.4*  MCV 93.9 92.6 93.5 94.7  PLT 253 275 300 295   Cardiac Enzymes: No results for input(s): CKTOTAL, CKMB, CKMBINDEX, TROPONINI in the last 168 hours. BNP: Invalid input(s): POCBNP CBG: Recent Labs  Lab 07/01/20 1149  GLUCAP 118*   D-Dimer No results for input(s): DDIMER in the last 72 hours. Hgb A1c No results for input(s): HGBA1C in the last 72 hours. Lipid Profile No results for input(s): CHOL, HDL, LDLCALC, TRIG, CHOLHDL, LDLDIRECT in the last 72 hours. Thyroid function studies No results for input(s): TSH, T4TOTAL, T3FREE, THYROIDAB in the last 72 hours.  Invalid input(s): FREET3 Anemia work up No results for input(s): VITAMINB12, FOLATE, FERRITIN, TIBC, IRON, RETICCTPCT in the last 72 hours. Urinalysis    Component Value Date/Time   COLORURINE AMBER (A) 06/24/2020 2058   APPEARANCEUR HAZY (A) 06/24/2020 2058   LABSPEC 1.026 06/24/2020 2058   PHURINE 5.0 06/24/2020 2058   GLUCOSEU NEGATIVE 06/24/2020 2058   HGBUR SMALL (A) 06/24/2020 2058   BILIRUBINUR NEGATIVE 06/24/2020 2058  [place] NEGATIVE 06/24/2020 2058   PROTEINUR 30 (A) 06/24/2020 2058   NITRITE NEGATIVE 06/24/2020 2058   LEUKOCYTESUR NEGATIVE 06/24/2020 2058   Sepsis Labs Invalid input(s): PROCALCITONIN,  WBC,  LACTICIDVEN Microbiology Recent Results (from the past 240 hour(s))  Respiratory Panel by RT PCR (Flu A&B, Covid) - Nasopharyngeal Swab     Status: None   Collection Time: 06/25/20  4:00 PM   Specimen: Nasopharyngeal Swab  Result Value Ref Range Status   SARS Coronavirus 2 by RT PCR NEGATIVE NEGATIVE Final    Comment: (NOTE) SARS-CoV-2 target nucleic acids are NOT DETECTED.  The SARS-CoV-2 RNA is generally detectable in upper respiratoy specimens during the acute phase of infection. The lowest concentration of  SARS-CoV-2 viral copies this assay can detect is 131 copies/mL. A negative result does not preclude SARS-Cov-2 infection and should not be used as the sole basis for treatment or other patient management decisions. A negative result may occur with  improper specimen collection/handling, submission of specimen other than nasopharyngeal swab, presence of viral mutation(s) within the areas targeted by this assay, and inadequate number of viral copies (<131 copies/mL). A negative result must be combined with clinical observations, patient history, and epidemiological information. The expected result is Negative.  Fact Sheet for Patients:  PinkCheek.be  Fact Sheet for Healthcare Providers:  GravelBags.it  This test is no t yet approved or cleared by the [place] FDA and  has been authorized for detection and/or diagnosis of SARS-CoV-2 by FDA under an Emergency Use Authorization (EUA). This EUA will remain  in effect (meaning this test can be used) for the duration of the COVID-19 declaration under Section 564(b)(1) of the Act, 21 U.S.C. section 360bbb-3(b)(1), unless the authorization is terminated or revoked sooner.     Influenza A by PCR NEGATIVE NEGATIVE Final   Influenza B by PCR NEGATIVE NEGATIVE Final    Comment: (NOTE) The Xpert Xpress SARS-CoV-2/FLU/RSV assay is intended as an aid in  the diagnosis of influenza from Nasopharyngeal swab specimens and  should not be used as a sole basis for treatment. Nasal washings and  aspirates are unacceptable for Xpert Xpress SARS-CoV-2/FLU/RSV  testing.  Fact Sheet for Patients: PinkCheek.be  Fact Sheet for Healthcare Providers: GravelBags.it  This test is not yet approved or cleared by the [place] FDA and  has been authorized for detection and/or diagnosis of SARS-CoV-2 by  FDA under an Emergency Use  Authorization (EUA). This EUA will remain  in effect (meaning this test can be used) for the duration of the  Covid-19 declaration under Section 564(b)(1) of the Act, 21  U.S.C. section 360bbb-3(b)(1), unless the authorization is  terminated or revoked. Performed at [facility], [place] [address], [place], [place] [address]   SARS CORONAVIRUS 2 (TAT 6-24 HRS) Nasopharyngeal Nasopharyngeal Swab     Status: None   Collection Time: 06/26/20 11:40 AM   Specimen: Nasopharyngeal Swab  Result Value Ref Range Status   SARS Coronavirus 2 NEGATIVE NEGATIVE Final    Comment: (NOTE) SARS-CoV-2 target nucleic acids are NOT DETECTED.  The SARS-CoV-2 RNA is generally detectable in upper and lower respiratory specimens during the acute phase of infection. Negative results do not preclude SARS-CoV-2 infection, do not rule out co-infections with other pathogens, and should not be used as the sole basis for treatment or other patient management decisions. Negative results must be combined with clinical observations, patient history, and epidemiological information. The expected result is Negative.  Fact Sheet for Patients: SugarRoll.be  Fact Sheet for Healthcare Providers: https://www.woods-mathews.com/  This test is not yet approved or cleared by the [place] FDA and  has been authorized for detection and/or diagnosis of SARS-CoV-2 by FDA under an Emergency Use Authorization (EUA). This EUA will remain  in effect (meaning this test can be used) for the duration of the COVID-19 declaration under Se ction 564(b)(1) of the Act, 21 U.S.C. section 360bbb-3(b)(1), unless the authorization is terminated or revoked sooner.  Performed at [facility], [place] [address], [place], [place] [address]   Gram stain     Status: None   Collection Time: 06/27/20 11:40 AM   Specimen: PATH Cytology Pleural fluid  Result Value Ref Range Status   Specimen  Description PLEURAL FLUID  Final   Special Requests NONE  Final   Gram Stain   Final    WBC PRESENT,BOTH PMN AND MONONUCLEAR NO ORGANISMS SEEN CYTOSPIN SMEAR Performed at [facility], [place] [address], [place], [place] [address]    Report Status 06/27/2020 FINAL  Final  Culture, body fluid-bottle     Status: None   Collection Time: 06/27/20 11:40 AM   Specimen: Pleura  Result Value Ref Range Status   Specimen Description PLEURAL FLUID  Final   Special Requests NONE  Final   Culture   Final    NO GROWTH 5 DAYS Performed at [facility] [address], [place], [place] [address]    Report Status 07/02/2020 FINAL  Final  Culture, body fluid-bottle     Status: None   Collection Time: 06/28/20 10:53 AM   Specimen: Pleura  Result Value Ref Range Status   Specimen Description PLEURAL FLUID  Final   Special Requests LEFT LUNG  Final   Culture   Final    NO GROWTH 5 DAYS Performed at [facility] [address], [place], [place] [address]    Report Status 07/03/2020 FINAL  Final  Gram stain     Status: None   Collection Time: 06/28/20 10:53 AM   Specimen: Pleura  Result Value Ref Range Status   Specimen Description PLEURAL FLUID  Final   Special Requests LEFT LUNG  Final   Gram Stain   Final    WBC PRESENT,BOTH PMN AND MONONUCLEAR NO ORGANISMS SEEN CYTOSPIN SMEAR Performed at [facility], [address] [place],  [address]    Report Status 06/28/2020 FINAL  Final     Time coordinating discharge: 35  minutes  SIGNED: Antonieta Pert, MD  [facility] 07/05/2020, 1:31 PM  If 7PM-7AM, please contact night-coverage [email or web address]

## 2020-07-05 NOTE — Telephone Encounter (Signed)
Received a call from the pt's brother to reschedule his new pt appt to 11/24 at 11am.

## 2020-07-05 NOTE — Progress Notes (Signed)
OT Cancellation Note  Patient Details Name: Chris Morales MRN: [id number] DOB: July 11, 1956   Cancelled Treatment:    Reason Eval/Treat Not Completed: Other (comment) (Pt OTF for imaging at time OT attempted to initiate treatment. Per CIR AC, pt will be admitted soon. Will follow as long as acute)  Zenovia Jarred, MSOT, OTR/L [facility] Office Number: [phone] Pager: [phone]  Zenovia Jarred 07/05/2020, 11:50 AM

## 2020-07-05 NOTE — Progress Notes (Signed)
Pt arrived to 4W14 per wheelchair. Assessment complete. Meds, history, allergies and plan of care reviewed. Pt has no further questions.  Sheela Stack, LPN

## 2020-07-05 NOTE — Progress Notes (Addendum)
Inpatient Rehabilitation Admissions Coordinator  I have insurance approval for Cir admit today. I await medical clearance to admit today. Patient sleeping after receiving Ativan and having Imaging completed. I spoke with his brother and POA, Dr. Danella Deis and he is aware of plan.  Danne Baxter, RN, MSN Rehab Admissions Coordinator [phone] 07/05/2020 11:41 AM  I have medical clearance to admit to CIR today. I will make the arrangements.  Danne Baxter, RN, MSN Rehab Admissions Coordinator [phone] 07/05/2020 12:17 PM

## 2020-07-05 NOTE — Progress Notes (Signed)
Pt arrived to unit, pt is alert and oriented, has foley in place draining amber/tea colored fluid with sediment, pt educated about rehab.

## 2020-07-05 NOTE — PMR Pre-admission (Signed)
PMR Admission Coordinator Pre-Admission Assessment  Patient: Chris Morales is an 64 y.o., male MRN: [id number] DOB: Dec 28, 1955 Height: 6' 1" (185.4 cm) Weight: 70.6 kg  Insurance Information  PRIMARY: [employer] [place]/[employer] policy with no out of network benefits. Single Case agreement made with [facility] and payor for this admit using in network benefits as approved via contract negotiations     Policy#: [id number]     Subscriber: pt CM Name: Altha Harm      Phone#: [phone]     Fax#: [phone] Pre-Cert#: case # [id number]/ request [id number] case number [place]  Approved for 7 days when f/u is requested  Employer: [employer] insurance Benefits:  Phone #: [phone]     Name:  Eff. Date: active 07/05/2020     Deduct: $2500      Out of Pocket Max: $7500 includes deductible CIR: 70%      SNF:  Outpatient:      Co-Pay:  Home Health:       Co-Pay:  DME:      Co-Pay:  Providers: in network benefits only  SECONDARY:       Policy#:      Phone#:   Development worker, community:       Phone#:   The Engineer, petroleum" for patients in Inpatient Rehabilitation Facilities with attached "Privacy Act [place] Records" was provided and verbally reviewed with: N/A  Emergency Contact Information Contact Information    Name Relation Home Work Wade, Darnell Level Brother   [place], [place] [phone]        Current Medical History  Patient Admitting Diagnosis: debility with metastatic prostate cancer  History of Present Illness:Chris Morales is a 64 year old right-handed male with history of prostate cancer diagnosed June 2020 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   Patient with complicated course he was recently hospitalized at [facility] in [place] from 10/17-10/25 for  shortness of breath and atrial fibrillation he did require intubation for a short time.  Attempts were made at chemical cardioversion that failed and was placed on Eliquis.  Reportedly after patient was extubated he was having some left side weakness particularly left upper extremity.  He was frustrated as to the delay in getting an MRI of his brain decided to leave AMA early morning of 06/24/2020 and return to the [place] area to be closer to his brother who is a retired Software engineer and patient immediately after landing in the [place] area was brought to [facility] for ongoing evaluation.  In the ED he was found to still be in rapid atrial fibrillation.  Chest x-ray showed substantial bilateral pleural effusions with evidence of diffuse metastatic lesions in the bones.  CT angiogram of the chest negative for pulmonary emboli.  He had a markedly elevated D-dimer of 19.33.  MRI of the brain showed no acute infarction however there was some thickening enhancement along the posterior falx cerebri with possible minimal associated parenchymal edema.  Attempts were made at MR C-spine but patient was unable to tolerate.  Admission chemistries glucose 153, calcium 7.5, alkaline phosphatase 2755, BNP 281, urine culture no growth, digoxin level 1.0, troponin negative, hemoglobin 9.0, WBC 8.4.  Patient currently remains on Lanoxin as well as Cardizem 30 mg every 8 hours with Eliquis ongoing as well as Toprol-XL 100 mg twice daily.  Echocardiogram with ejection fraction of 50  to 55% no wall motion abnormalities.  Patient did undergo IR ultrasound-guided right thoracentesis on 06/27/2020 with 1.6 L dark yellow fluid removed and left thoracentesis 06/28/2020 with 1.3 L bright yellow fluid removed and cultures continue to show no growth.  Latest chest x-ray 07/01/2020 showed no significant recurrence of left pleural fluid since thoracentesis stable bibasilar atelectasis.  Oncology services Dr. Jana Hakim have been consulted  in regards to prostate cancer and remains on Casodex and current plans to follow-up outpatient with Dr. [name] as well as palliative care established with plan to repeat MRI of the brain in 2 months per Dr. Mickeal Skinner.  A Foley catheter tube currently remains in place for urinary retention awaiting plan for voiding trial.  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  Multilevel neural foraminal narrowing greatest on the left at C3-4 moderate to severe and C4-5 moderate.     Patient's medical [facility] from [facility]  has been reviewed by the rehabilitation admission coordinator and physician.  Past Medical History  Past Medical History:  Diagnosis Date  . Prostate CA [facility])     Family History   family history includes Prostate cancer in his brother.  Prior Rehab/Hospitalizations Has the patient had prior rehab or hospitalizations prior to admission? Yes  Has the patient had major surgery during 100 days prior to admission? Yes   Current Medications  Current Facility-Administered Medications:  .  acetaminophen (TYLENOL) tablet 650 mg, 650 mg, Oral, Q4H PRN, Shalhoub, Sherryll Burger, MD .  apixaban (ELIQUIS) tablet 5 mg, 5 mg, Oral, BID, [place] Donnamarie Poag, DO, 5 mg at 07/05/20 0949 .  bicalutamide (CASODEX) tablet 50 mg, 50 mg, Oral, Daily, Magrinat, Virgie Dad, MD, 50 mg at 07/05/20 0949 .  Chlorhexidine Gluconate Cloth 2 % PADS 6 each, 6 each, Topical, Daily, Annita Brod, MD, 6 each at 07/03/20 1024 .  digoxin (LANOXIN) tablet 0.25 mg, 0.25 mg, Oral, Daily, Annita Brod, MD, 0.25 mg at 07/05/20 0949 .  diltiazem (CARDIZEM) tablet 30 mg, 30 mg, Oral, Q8H, [place] Donnamarie Poag, DO, 30 mg at 07/05/20 2229 .  levothyroxine (SYNTHROID) tablet 25 mcg, 25 mcg, Oral, QAC breakfast, Annita Brod,  MD, 25 mcg at 07/05/20 [phone] .  lidocaine (PF) (XYLOCAINE) 1 % injection, , , PRN, Covington, Jamie R, NP, 15 mL at 06/27/20 1112 .  lidocaine (XYLOCAINE) 1 % (with pres) injection, , Infiltration, PRN, Covington, Jamie R, NP, 10 mL at 06/28/20 1032 .  metoprolol succinate (TOPROL-XL) 24 hr tablet 100 mg, 100 mg, Oral, BID, [place] Donnamarie Poag, DO, 100 mg at 07/05/20 0949 .  ondansetron (ZOFRAN) injection 4 mg, 4 mg, Intravenous, Q6H PRN, Shalhoub, Sherryll Burger, MD .  polyethylene glycol (MIRALAX / GLYCOLAX) packet 17 g, 17 g, Oral, Daily PRN, Shalhoub, Sherryll Burger, MD  Patients Current Diet:  Diet Order            Diet Heart Room service appropriate? Yes; Fluid consistency: Thin  Diet effective now                 Precautions / Restrictions Precautions Precautions: Fall Precaution Comments: monitor O2 sats Restrictions Weight Bearing Restrictions: No   Has the patient had 2 or more falls or a fall with injury in the past year? 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 Independent  Indoor Mobility: Did the patient need assistance with walking from room to room (with or without device)? Independent  Stairs: Did the patient need assistance with internal or external stairs (with or without device)? Independent  Functional Cognition: Did the patient need help planning regular tasks such as shopping or remembering to take medications? Independent  Home Assistive Devices / Equipment Home Equipment: None  Prior Device Use: Indicate devices/aids used by the patient prior to current illness, exacerbation or injury? None of the above  Current Functional Level Cognition  Overall Cognitive Status: Impaired/Different from baseline Current Attention Level: Focused Orientation Level: Oriented X4 Following Commands: Follows one step commands  consistently Safety/Judgement: Decreased awareness of deficits General Comments: continues to require step by step instruction- appears to have difficulty following multistep/higher level instruction    Extremity Assessment (includes Sensation/Coordination)  Upper Extremity Assessment: RUE deficits/detail, LUE deficits/detail RUE Deficits / Details: Pt states he surffered a rotator cuff injury @ 4 months ago and his PCP had set him up with "therapy". Has not seen ortho MD for this yet. elbow/wrist/hand WFL; no ability to flex, abd or exteranlly rotate RUE; scapular ROM WFL RUE Sensation: WNL RUE Coordination: decreased gross motor LUE Deficits / Details: overall weaker than R regardindg elbow/wrist and hand. grip @ 3+/5; Difficulty achieveing full composte extension with wrist extension - extensor lag.; elbow flex 4/5; extension 2+/5 - unable to extend against gravity; weak shoulder girdle with notable substitution, however using functionally. Note dysmetria LUE Coordination: decreased fine motor, decreased gross motor  Lower Extremity Assessment: Defer to PT evaluation (LLE weaker than R; especially proximally)    ADLs  Overall ADL's : Needs assistance/impaired Eating/Feeding: Set up, Sitting Grooming: Min guard, Standing Grooming Details (indicate cue type and reason): standing at sink with intermittent UE bracing for oral care Upper Body Bathing: Set up, Sitting Lower Body Bathing: Sit to/from stand, Moderate assistance Lower Body Bathing Details (indicate cue type and reason): able to complete figure four positioning Upper Body Dressing : Minimal assistance, Sitting Lower Body Dressing: Moderate assistance, Sit to/from stand Lower Body Dressing Details (indicate cue type and reason): ot don socks seated EOB Toilet Transfer: Min guard, Ambulation, RW Toilet Transfer Details (indicate cue type and reason): able to power up with min guard this session, which is improvement from previous  sessions Toileting- Clothing Manipulation and Hygiene: Moderate assistance Toileting - Clothing Manipulation Details (indicate cue type and reason): foley cath Functional mobility during ADLs: Min guard, Rolling walker General ADL Comments: pt able to progress in ADL mobility, also focus on BUE HEP    Mobility  Overal bed mobility: Needs Assistance Bed Mobility: Rolling, Sidelying to Sit Rolling: Min guard Sidelying to sit: Min assist Supine to sit: Min guard, HOB elevated Sit to supine: Min assist General bed mobility comments: Pt seated in recliner on arrival.    Transfers  Overall transfer level: Needs assistance Equipment used: Rolling walker (2 wheeled) Transfers: Sit to/from Stand Sit to Stand: Min guard General transfer comment: pt able to progress to power up of min guard assist with RW. Use of bil hand rests to push    Ambulation / Gait / Stairs / Wheelchair Mobility  Ambulation/Gait Ambulation/Gait assistance: Min guard Gait Distance (Feet): 120 Feet (standing rest break after 20 ft for around 30 seconds.) Assistive device: Rolling walker (2 wheeled) Gait Pattern/deviations: Step-through pattern,  Wide base of support, Trunk flexed General Gait Details: Cues for upper trunk control and scapular retraction.  Pt continues to move with slow gt speed and noticeable fatigue. Gait velocity: reduced Gait velocity interpretation: <1.31 ft/sec, indicative of household ambulator    Posture / Balance Dynamic Sitting Balance Sitting balance - Comments: apparent weak core Balance Overall balance assessment: Needs assistance Sitting-balance support: Feet supported Sitting balance-Leahy Scale: Fair Sitting balance - Comments: apparent weak core Standing balance support: During functional activity, Single extremity supported Standing balance-Leahy Scale: Poor Standing balance comment: at least one UE supported during standing ADLs    Special needs/care consideration New oncology  follow up planned as outpatient here in [place] To stay with local brother per brother; patient still talks of returning to his practice in [place] is [place]:  (lived alone in [place])  Lives With: Alone Available Help at Discharge: Family, Available 24 hours/day (sister in Sports coach and brother to provide 24/7 supervision) Type of Home: House Home Layout: One level Home Access: Stairs to enter [employer] Shower/Tub: Multimedia programmer: Standard Bathroom Accessibility: Yes How Accessible: Accessible via walker Home Care Services: No Additional Comments: to stay with brother in [place] at discharge  Discharge Living Setting Plans for Discharge Living Setting: Lives with (comment) (to stay with brother in [place] at d/c. Not to return to [place] per ) Type of Home at Discharge: House Discharge Home Layout: One level Discharge Home Access: Stairs to enter Discharge Bathroom Shower/Tub: Walk-in shower Discharge Bathroom Toilet: Standard Discharge Bathroom Accessibility: Yes How Accessible: Accessible via walker Does the patient have any problems obtaining your medications?: No  Social/Family/Support Systems Contact Information: brother , Dr. Danella Deis is health Care POA Anticipated Caregiver: brother and sister in law Anticipated Caregiver's Contact Information: see above Caregiver Availability: 24/7 Discharge Plan Discussed with Primary Caregiver: Yes Is Caregiver In Agreement with Plan?: Yes Does Caregiver/Family have Issues with Lodging/Transportation while Pt is in Rehab?: No  Goals Patient/Family Goal for Rehab: mod I to supervision with PT, OT, and SLP Expected length of stay: ELOS 5 to 7 days Pt/Family Agrees to Admission and willing to participate: Yes Program Orientation Provided & Reviewed with Pt/Caregiver Including Roles  & Responsibilities: Yes  Decrease burden of Care through IP rehab admission: n/a  Possible need  for SNF placement upon discharge: not anticipated  Patient Condition: I have reviewed medical records from [facility] , spoken with CM, and patient and family member. I met with patient at the bedside for inpatient rehabilitation assessment.  Patient will benefit from ongoing PT, OT and SLP, can actively participate in 3 hours of therapy a day 5 days of the week, and can make measurable gains during the admission.  Patient will also benefit from the coordinated team approach during an Inpatient Acute Rehabilitation admission.  The patient will receive intensive therapy as well as Rehabilitation physician, nursing, social worker, and care management interventions.  Due to bladder management, bowel management, safety, skin/wound care, disease management, medication administration, pain management and patient education the patient requires 24 hour a day rehabilitation nursing.  The patient is currently min to mod assist overall with mobility and basic ADLs.  Discharge setting and therapy post discharge at home with home health is anticipated.  Patient has agreed to participate in the Acute Inpatient Rehabilitation Program and will admit today.  Preadmission Screen Completed By:  Cleatrice Burke, 07/05/2020 12:42 PM ______________________________________________________________________  Discussed status with Dr. Ranell Patrick on  07/05/2020  at  1259 and received approval for admission today.  Admission Coordinator:  Cleatrice Burke, RN, time  2202 Date  07/05/2020   Assessment/Plan: Diagnosis: Debility secondary to Afib with RVR 1. Does the need for close, 24 hr/day Medical supervision in concert with the patient's rehab needs make it unreasonable for this patient to be served in a less intensive setting? Yes 2. 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 Due to bladder management, bowel management, safety, skin/wound care, disease management, medication administration, pain management and patient education, does the patient require 24 hr/day rehab nursing? Yes 4. Does the patient require coordinated care of a physician, rehab nurse, PT, OT  to address physical and functional deficits in the context of the above medical diagnosis(es)? Yes Addressing deficits in the following areas: balance, endurance, locomotion, strength, transferring, bowel/bladder control, bathing, dressing, feeding, grooming, toileting and psychosocial support 5. Can the patient actively participate in an intensive therapy program of at least 3 hrs of therapy 5 days a week? Yes 6. The potential for patient to make measurable gains while on inpatient rehab is excellent 7. Anticipated functional outcomes upon discharge from inpatient rehab: modified independent PT, modified independent OT, independent SLP 8. Estimated rehab length of stay to reach the above functional goals is: modI 6-8 days 9. Anticipated discharge destination: Home 10. Overall Rehab/Functional Prognosis: excellent   MD Signature: Leeroy Cha, MD

## 2020-07-05 NOTE — H&P (Signed)
Physical Medicine and Rehabilitation Admission H&P  CC: Cardiac debility: afib with RVR  HPI: Chris Morales is a 64 year old right-handed male with history of prostate cancer diagnosed June 2020 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  He does have family in the [place] area who assist as needed.  Patient with complicated course he was recently hospitalized at [facility] in [place] from 10/17-10/25 for shortness of breath and atrial fibrillation he did require intubation for a short time.  Attempts were made at chemical cardioversion that failed and was placed on Eliquis.  Reportedly after patient was extubated he was having some left side weakness particularly left upper extremity.  He was frustrated as to the delay in getting an MRI of his brain decided to leave AMA early morning of 06/24/2020 and return to the [place] area to be closer to his brother who is a retired Software engineer and patient immediately after landing in the [place] area was brought to [facility] for ongoing evaluation.  In the ED he was found to still be in rapid atrial fibrillation.  Chest x-ray showed substantial bilateral pleural effusions with evidence of diffuse metastatic lesions in the bones.  CT angiogram of the chest negative for pulmonary emboli.  He had a markedly elevated D-dimer of 19.33.  MRI of the brain showed no acute infarction however there was some thickening enhancement along the posterior falx cerebri with possible minimal associated parenchymal edema.  Attempts were made at MR C-spine but patient was unable to tolerate.  Admission chemistries glucose 153, calcium 7.5, alkaline phosphatase 2755, BNP 281, urine culture no  growth, digoxin level 1.0, troponin negative, hemoglobin 9.0, WBC 8.4.  Patient currently remains on Lanoxin as well as Cardizem 30 mg every 8 hours with Eliquis ongoing as well as Toprol-XL 100 mg twice daily.  Echocardiogram with ejection fraction of 50 to 55% no wall motion abnormalities.  Patient did undergo IR ultrasound-guided right thoracentesis on 06/27/2020 with 1.6 L dark yellow fluid removed and left thoracentesis 06/28/2020 with 1.3 L bright yellow fluid removed and cultures continue to show no growth.  Latest chest x-ray 07/01/2020 showed no significant recurrence of left pleural fluid since thoracentesis stable bibasilar atelectasis.  Oncology services Dr. Jana Hakim have been consulted in regards to prostate cancer and remains on Casodex and current plans to follow-up outpatient with Dr. [name] as well as palliative care established with plan to repeat MRI of the brain in 2 months per Dr. Mickeal Skinner.  A Foley catheter tube currently remains in place for urinary retention awaiting plan for voiding trial.  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  Multilevel neural foraminal narrowing greatest on the left at C3-4 moderate to severe and C4-5 moderate.  Therapy evaluations completed and patient was admitted for a comprehensive rehab program.  Review of Systems  Constitutional: Negative for chills and fever.  HENT: Negative for hearing loss.   Eyes: Negative for blurred vision and double vision.  Respiratory: Positive for shortness of breath.   Cardiovascular: Positive for palpitations and leg swelling. Negative for chest pain.  Gastrointestinal: Positive  for constipation. Negative for heartburn, nausea and vomiting.  Genitourinary: Positive for urgency. Negative for dysuria, flank pain and hematuria.   Musculoskeletal: Positive for myalgias.  Skin: Negative for rash.  All other systems reviewed and are negative.  Past Medical History:  Diagnosis Date  . Prostate CA [facility])    Past Surgical History:  Procedure Laterality Date  . IR THORACENTESIS ASP PLEURAL SPACE W/IMG GUIDE  06/27/2020  . IR THORACENTESIS ASP PLEURAL SPACE W/IMG GUIDE  06/28/2020  . LAMINECTOMY Bilateral 02/20/2019   Procedure: CERVICAL LAMINECTOMY FOR TUMOR;  Surgeon: Earnie Larsson, MD;  Location: [facility];  Service: Neurosurgery;  Laterality: Bilateral;  CERVICAL LAMINECTOMY FOR TUMOR  . LIGAMENT REPAIR Left    Family History  Problem Relation Age of Onset  . Prostate cancer Brother    Social History:  reports that he has never smoked. He has never used smokeless tobacco. He reports current alcohol use. He reports that he does not use drugs. Allergies: No Known Allergies Medications Prior to Admission  Medication Sig Dispense Refill  . apixaban (ELIQUIS) 5 MG TABS tablet Take 5 mg by mouth 2 (two) times daily.    . bicalutamide (CASODEX) 50 MG tablet Take 50 mg by mouth daily.    . digoxin (LANOXIN) 0.25 MG tablet Take 0.25 mg by mouth daily.    Marland Kitchen diltiazem (CARDIZEM) 30 MG tablet Take 1 tablet (30 mg total) by mouth every 8 (eight) hours.    . docusate sodium (COLACE) 100 MG capsule Take 100 mg by mouth 2 (two) times daily as needed (constipation).    . folic acid (FOLVITE) 1 MG tablet Take 1 mg by mouth daily.    Marland Kitchen levothyroxine (SYNTHROID) 25 MCG tablet Take 25 mcg by mouth daily before breakfast.    . metoprolol succinate (TOPROL-XL) 50 MG 24 hr tablet Take 100 mg by mouth in the morning and at bedtime. Take with or immediately following a meal.    . pantoprazole (PROTONIX) 40 MG tablet Take 40 mg by mouth daily.    Marland Kitchen senna (SENOKOT) 8.6 MG TABS tablet Take 1 tablet by mouth 2 (two) times daily as needed (constipation).      Drug Regimen Review Drug regimen was reviewed and remains appropriate with no  significant issues identified  Home: Home Living Family/patient expects to be discharged to:: Private residence Living Arrangements: Alone Available Help at Discharge: Family, Available PRN/intermittently Type of Home: House Home Layout: One level Bathroom Shower/Tub: Multimedia programmer: Standard Home Equipment: None   Functional History: Prior Function Level of Independence: Independent  Functional Status:  Mobility: Bed Mobility Overal bed mobility: Needs Assistance Bed Mobility: Rolling, Sidelying to Sit Rolling: Min guard Sidelying to sit: Min assist Supine to sit: Min guard, HOB elevated Sit to supine: Min assist General bed mobility comments: Pt seated in recliner on arrival. Transfers Overall transfer level: Needs assistance Equipment used: Rolling walker (2 wheeled) Transfers: Sit to/from Stand Sit to Stand: Mod assist General transfer comment: Pt required cues to scoot to edge of recliner, for foot placement, to push from arm rest and lean forward.  Pt with smoother transition but intial mod assistance to boost into standing. Ambulation/Gait Ambulation/Gait assistance: Min guard Gait Distance (Feet): 120 Feet (standing rest break after 20 ft for around 30 seconds.) Assistive device: Rolling walker (2 wheeled) Gait Pattern/deviations: Step-through pattern, Wide base of support, Trunk flexed General Gait Details: Cues for upper trunk control and scapular retraction.  Pt continues to move with slow  gt speed and noticeable fatigue. Gait velocity: reduced Gait velocity interpretation: <1.31 ft/sec, indicative of household ambulator  ADL: ADL Overall ADL's : Needs assistance/impaired Eating/Feeding: Set up, Sitting Grooming: Oral care, Standing, Min guard Grooming Details (indicate cue type and reason): min guard for safety while standing at sink with RW Upper Body Bathing: Set up, Sitting Lower Body Bathing: Sit to/from stand, Moderate  assistance Lower Body Bathing Details (indicate cue type and reason): able to complete figure four positioning Upper Body Dressing : Minimal assistance, Sitting Lower Body Dressing: Moderate assistance, Sit to/from stand Lower Body Dressing Details (indicate cue type and reason): ot don socks seated EOB Toilet Transfer: Minimal assistance, Moderate assistance, RW, Ambulation Toilet Transfer Details (indicate cue type and reason): simulated via functional mobility with RW; min A for ambulation, but MOD A to power up from [place] and Hygiene: Moderate assistance Toileting - Clothing Manipulation Details (indicate cue type and reason): foley cath Functional mobility during ADLs: Minimal assistance, Rolling walker General ADL Comments: session focus on BUE HEP and standingn grooming tasks, pt continues to present with decreased ROM in RUE and impaired strength in BUEs  Cognition: Cognition Overall Cognitive Status: Impaired/Different from baseline Orientation Level: Oriented X4 Cognition Arousal/Alertness: Awake/alert Behavior During Therapy: WFL for tasks assessed/performed Overall Cognitive Status: Impaired/Different from baseline Area of Impairment: Problem solving Orientation Level: Disoriented to, Situation Current Attention Level: Focused Following Commands: Follows one step commands consistently Safety/Judgement: Decreased awareness of deficits Awareness: Intellectual Problem Solving: Slow processing, Requires verbal cues, Requires tactile cues General Comments: Pt required step by step instruction to perform sit to stand transfers.  There were no vitals taken for this visit. Physical Exam General: Sedated from medications, No apparent distress HEENT: Head is normocephalic, atraumatic, PERRLA, EOMI, sclera anicteric, oral mucosa pink and moist, dentition intact, ext ear canals clear,  Neck: Supple without JVD or lymphadenopathy Heart: Reg rate  and rhythm. No murmurs rubs or gallops Chest: CTA bilaterally without wheezes, rales, or rhonchi; no distress Abdomen: Soft, non-tender, non-distended, bowel sounds positive. Extremities: No clubbing, cyanosis, or edema. Pulses are 2+ Skin: Clean and intact without signs of breakdown Neuro: Sedated from medication and unable to tolerate exam Psych: Pt's affect is appropriate. Pt is cooperative  No results found for this or any previous visit (from the past 48 hour(s)). MR CERVICAL SPINE W WO CONTRAST  Addendum Date: 07/05/2020   ADDENDUM REPORT: 07/05/2020 13:24 ADDENDUM: I was subsequently contacted by Dr. Curly Shores of neurology who directed my attention to subtle dural thickening and enhancement at the C5-C6 level. The dura measures 1-2 mm in thickness at this level. This finding may be postoperative in etiology. Trace dural-based tumor is difficult to definitively exclude. Consider contrast-enhanced MRI follow-up for surveillance. No resultant significant spinal cord mass effect at this level. This addendum was discussed with Dr. Curly Shores by telephone at approximately 1:30 p.m. on 07/05/2020. Electronically Signed   By: Kellie Simmering DO   On: 07/05/2020 13:24   Result Date: 07/05/2020 CLINICAL DATA:  Myelopathy, acute or progressive; left arm weakness. Additional history provided: History of prostate cancer metastatic to bone, status post cervical laminectomy with tumor removal in June 2020. EXAM: MRI CERVICAL SPINE WITHOUT AND WITH CONTRAST TECHNIQUE: Multiplanar and multiecho pulse sequences of the cervical spine, to include the craniocervical junction and cervicothoracic junction, were obtained without and with intravenous contrast. CONTRAST:  83mL GADAVIST GADOBUTROL 1 MMOL/ML IV SOLN COMPARISON:  Radiographs of the cervical spine 02/20/2019. Cervical spine MRI 02/19/2019.  Cervical spine MRI 02/20/2019. FINDINGS: Alignment: Reversal of the expected cervical lordosis. No significant spondylolisthesis.  Vertebrae: Vertebral body height is maintained. Diffuse metastatic disease throughout the cervical spine. Since the prior MRI of 02/20/2019, there has been interval C4, C5 and C6 laminectomies for resection of epidural metastatic tumor and microdiscectomy. Multilevel degenerative endplate irregularity. Cord: No spinal cord signal abnormality. Posterior Fossa, vertebral arteries, paraspinal tissues: No abnormality identified within included portions of the posterior fossa. Bilateral mastoid effusions. Flow voids preserved within the imaged cervical vertebral arteries. Paraspinal soft tissues within normal limits. Unchanged nonspecific 13 mm round T2 hyperintense subcutaneous lesion within the posterior right upper back (series 8, image 36). Disc levels: Unless otherwise stated, the level by level findings below have not significantly changed since prior MRIs of 02/20/2019 and 02/19/2019. Severe disc degeneration at C3-C4, C4-C5, C5-C6, C6-C7 and C7-T1. C2-C3: No significant disc herniation or stenosis. C3-C4: Disc bulge. Uncovertebral and facet hypertrophy. The disc bulge effaces the ventral thecal sac, contacting and minimally flattening the ventral spinal cord with overall mild relative spinal canal narrowing. Moderate/severe left neural foraminal narrowing. C4-C5: Interval laminectomies for resection of previous dorsal epidural tumor. Posterior disc osteophyte complex. Uncinate and facet hypertrophy. No significant spinal canal stenosis. Bilateral neural foraminal narrowing (mild right, moderate left). C5-C6: Interval laminectomies for resection of previous dorsal epidural tumor. Posterior disc osteophyte complex. Uncinate and facet hypertrophy. The disc osteophyte complex minimally effaces the ventral thecal sac without significant spinal canal stenosis. Mild/moderate bilateral neural foraminal narrowing. C6-C7: Posterior disc osteophyte complex. Uncinate and facet hypertrophy. Mild spinal canal stenosis.  Bilateral neural foraminal narrowing (mild right, moderate left). C7-T1: This level is imaged sagittally. No significant spinal canal stenosis. Mild bilateral neural foraminal narrowing. IMPRESSION: Since the prior MRI of 02/20/2019, there has been interval C4, C5 and C6 laminectomies for resection of previous dorsal epidural metastatic tumor. No evidence of recurrent epidural tumor at this site. No convincing evidence of epidural metastatic disease within the cervical spine. 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 Cervical spondylosis as outlined and having not significantly changed from the cervical spine MRI of 02/20/2020. Severe disc degeneration throughout the majority of the cervical spine. No more than mild spinal canal stenosis at any level. At C3-C4, a disc bulge contributes to mild relative spinal canal narrowing, contacting and minimally flattening the ventral spinal cord. Multilevel neural foraminal narrowing greatest on the left at C3-C4 (moderate/severe), on the left at C4-C5 (moderate) and on the left at C6-C7 (moderate). Electronically Signed: By: Kellie Simmering DO On: 07/05/2020 11:43       Medical Problem List and Plan: 1.  Decreased functional mobility secondary to acute respiratory failure decompensated diastolic congestive heart failure bilateral pleural effusions status post thoracentesis  -patient may shower  -ELOS/Goals: modI 5-7 days 2.  Antithrombotics: -DVT/anticoagulation: Eliquis mg BID  -antiplatelet therapy: N/A 3. Pain Management: Tylenol as needed 4. Mood: Provide emotional support  -antipsychotic agents: N/A 5. Neuropsych: This patient is capable of making decisions on his own behalf. 6. Skin/Wound Care: Routine skin checks 7. Fluids/Electrolytes/Nutrition: Routine in and outs with follow-up chemistries 8.  Prostate cancer with bony metastasis.  Plans to follow-up outpatient Dr. [name] as well as Dr. Mickeal Skinner and plan to repeat  MRI of the brain 2 months.  Continue Casodex 50 mg daily 9.  Atrial fibrillation with RVR.  Lanoxin 0.25 mg daily, Cardizem 30 mg every 8 hours, Toprol-XL 100 mg twice daily 10. Acute on chronic anemia. Latest hemoglobin 8.4.  Follow-up CBC 11.  Hypothyroidism.  Synthroid 19mcg daily.  Latest TSH 7.345.  Synthroid was recently initiated while in [place] earlier this month.  Plan to repeat TFTs 4 to 6 weeks. 12.  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  Foley catheter tube placed in [place].DO NOT REMOVE FOLEY TUBE FOR NOW 13. S/p cervical laminectomy with tumor removal in June 2020. Repeat MRI cervical spine 11/5 was ordered for left upper extremity weakness and did not show recurrent epidural tumor; it does show diffuse osseous metastatic disease throughout the cervical spine with multilevle neural foraminal narrowing greateres on the left at C3-C4 > C4-C5: monitor for pain, left sided weakness, neurological deficits  Cathlyn Parsons, PA-C 07/03/2020   I have personally performed a face to face diagnostic evaluation, including, but not limited to relevant history and physical exam findings, of this patient and developed relevant assessment and plan.  Additionally, I have reviewed and concur with the physician assistant's documentation above.  Leeroy Cha, MD

## 2020-07-05 NOTE — Progress Notes (Signed)
Report given to Ashely, RN on 4W. Patient transported via wheelchair in stable condition to next unit. All belongings with patient.

## 2020-07-05 NOTE — Progress Notes (Signed)
PT Cancellation Note  Patient Details Name: Chris Morales MRN: [id number] DOB: Jun 01, 1956   Cancelled Treatment:    Reason Eval/Treat Not Completed: Other (comment);Patient at procedure or test/unavailable.  I attempted to see pt at 9:20 and he wanted to eat his breakfast first. I returned 30 minutes later and pt almost done with his breakfast. Began arranging room and before we could actually begin working transport came to take him to MRI. Will continue as later.    Shary Decamp [facility] 07/05/2020, 11:28 AM Nadine Pager [phone] Office [phone]

## 2020-07-05 NOTE — Consult Note (Signed)
NEURO HOSPITALIST CONSULT NOTE   Requestig physician: Dr. Lupita Leash  Reason for Consult: LUE weakness  History obtained from:  Patient and Chart    HPI:                                                                                                                                          Chris Morales is an 64 y.o. male with a history of hypothyroidism, a-fib (on Eliquis), prostate cancer with metastases to bone, s/p cervical laminectomy with tumor removal in June of 2020. 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 He recently was hospitalized in [place] for SOB with atrial fibrillation and was intubated at that time. Cardioversion was attempted but failed, and he was placed on Eliquis. Following extubation, he had left upper extremity weakness. There was delay in obtaining MRI, so patient left the hospital in [place] and traveled to [place] where his family lives. He immediately went to [facility] for assessment after his plane landed. He still was in a-fib with RVR. Imaging revealed bilateral pleural effusions with diffuse bony metastatic lesions. His D-dimer was significantly elevated. MRI brain revealed no acute infarction, but there was thickening and enhancement along the posterior falx cerebri with possible adjacent brain parenchyma edema. MRI C-spine was also ordered, but patient was unable to tolerate due to being given PO Ativan instead of IV.   Several specialists are on board for the patient's care, including Dr. Mickeal Skinner of Neurooncology, Dr. Jana Hakim of Oncology and palliative care. Neurology was consulted for further assessment of the patient's LUE weakness, which has recovered somewhat but is still present.    Past Medical History:  Diagnosis Date  . Prostate CA [facility])     Past Surgical History:  Procedure Laterality Date  . IR THORACENTESIS ASP PLEURAL SPACE W/IMG GUIDE  06/27/2020  . IR THORACENTESIS ASP PLEURAL SPACE W/IMG GUIDE  06/28/2020  . LAMINECTOMY Bilateral  02/20/2019   Procedure: CERVICAL LAMINECTOMY FOR TUMOR;  Surgeon: Earnie Larsson, MD;  Location: [facility];  Service: Neurosurgery;  Laterality: Bilateral;  CERVICAL LAMINECTOMY FOR TUMOR  . LIGAMENT REPAIR Left     Family History  Problem Relation Age of Onset  . Prostate cancer Brother               Social History:  reports that he has never smoked. He has never used smokeless tobacco. He reports current alcohol use. He reports that he does not use drugs.  No Known Allergies  MEDICATIONS:  Scheduled: . apixaban  5 mg Oral BID  . bicalutamide  50 mg Oral Daily  . Chlorhexidine Gluconate Cloth  6 each Topical Daily  . digoxin  0.25 mg Oral Daily  . diltiazem  30 mg Oral Q8H  . levothyroxine  25 mcg Oral QAC breakfast  . metoprolol succinate  100 mg Oral BID     ROS:                                                                                                                                       As per HPI.    Blood pressure 107/76, pulse 76, temperature 98.3 F (36.8 C), temperature source Oral, resp. rate 16, height 6\' 1"  (1.854 m), weight 70.6 kg, SpO2 98 %.   General Examination:                                                                                                       Physical Exam  HEENT-  [place]/AT   Lungs- Respirations unlabored Extremities- Pedal edema is noted.   Neurological Examination Mental Status: Alert, fully oriented, thought content appropriate.  Speech fluent without evidence of aphasia.  Able to follow all commands without difficulty. Excellent memory and insight.  Cranial Nerves: II: Visual fields intact with no extinction to DSS. PERRL III,IV, VI: No ptosis. EOMI. No nystagmus.  V,VII: Smile symmetric, facial temp sensation equal bilaterally VIII: hearing intact to voice IX,X: No hypophonia XI: Symmetric shoulder shrug XII: midline  tongue extension Motor: RUE 4-/5 proximally with 4+/5 grip LUE Deltoid 4-/5 Triceps 1/5 Biceps 4/5 Grip 4/5 RLE 4+/5 proximally with 4-/5 ADF/APF LLE 4+/5 proximally with 4-/5 ADF/APF Sensory: Temp and light touch intact throughout, bilaterally with no sensory loss in LUE Deep Tendon Reflexes:  LUE: 0 triceps, biceps and brachioradialis RUE: 1+ triceps, trace biceps and brachioradialis Bilateral patellae 0 Achilles reflexes 0 Plantars: Right: downgoing   Left: downgoing Cerebellar: No ataxia with FNF bilaterally  Gait: Deferred   Lab Results: Basic Metabolic Panel: Recent Labs  Lab 06/29/20 0408 06/29/20 0408 06/30/20 0448 07/02/20 0225 07/03/20 0147  NA 139  --  141 139 141  K 4.1  --  4.0 3.8 4.0  CL 109  --  112* 108 109  CO2 21*  --  22 23 22   GLUCOSE 87  --  94 114* 86  BUN 15  --  13 15 15   CREATININE 0.62  --  0.53* 0.59* 0.51*  CALCIUM 7.3*   < >  7.4* 7.7* 7.9*  MG 1.9  --  1.9 2.1 2.1   < > = values in this interval not displayed.    CBC: Recent Labs  Lab 06/29/20 0408 06/30/20 0448 07/02/20 0225 07/03/20 0147  WBC 8.3 7.5 6.2 5.4  HGB 7.2* 8.3* 8.0* 8.4*  HCT 24.7* 27.4* 27.3* 28.4*  MCV 93.9 92.6 93.5 94.7  PLT 253 275 300 295    Cardiac Enzymes: No results for input(s): CKTOTAL, CKMB, CKMBINDEX, TROPONINI in the last 168 hours.  Lipid Panel: No results for input(s): CHOL, TRIG, HDL, CHOLHDL, VLDL, LDLCALC in the last 168 hours.  Imaging: No results found.  Assessment: 64 year old cachectic male with metastatic prostate cancer, multiple bony metastases, recently diagnosed atrial fibrillation, recent intubation for respiratory compromise at OSH in [place], who has experienced decreased LUE strength in the context of diffuse muscle wasting, cachexia and diffuse limb weakness.  1. Exam reveals diffuse muscle wasting and cachexia, diffuse muscle weakness and hypo to areflexia, with disproportionate weakness of the left triceps. Suspect left C7  radiculopathy with possible involvement of C6 and C8 as well.  2. MRI brain revealed no stroke to account for the weakness. An enhancing dural based lesion with possible adjacent brain parenchymal edema was noted, however.   Recommendations: 1. MRI C-spine with and without contrast. Will need 2 mg IV Ativan prior to MRI per patient request. Would not give PO as this has slower onset and lower peak effect than the IV form.  2. PT/OT 3. Continue to optimize his nutrition. 4. Continue anticoagulation for his atrial fibrillation.  5. Neurooncology and Oncology are following 6. Neurology will continue to follow with you  Electronically signed: Dr. Kerney Elbe 07/05/2020, 8:02 AM

## 2020-07-05 NOTE — Progress Notes (Signed)
Chris Ribas, MD  Physician  Physical Medicine and Rehabilitation  PMR Pre-admission     Signed  Date of Service:  07/05/2020 12:42 PM      Related encounter: ED to Hosp-Admission (Current) from 06/24/2020 in [facility]      Signed       Show:Clear all [x] Manual[x] Template[x] Copied  Added by: [x] Cristina Gong, RN[x] Raulkar, Clide Deutscher, MD  [] Hover for details PMR Admission Coordinator Pre-Admission Assessment   Patient: Chris Morales is an 64 y.o., male MRN: [id number] DOB: [birth year] Height: 6' 1"  (185.4 cm) Weight: 70.6 kg   Insurance Information   PRIMARY: [employer] [place]/[employer] policy with no out of network benefits. Single Case agreement made with [facility] and payor for this admit using in network benefits as approved via contract negotiations     Policy#: [id number]     Subscriber: pt CM Name: Chris Morales      Phone#: [phone]     Fax#: [phone] Pre-Cert#: case # [id number] request [id number] case number [id number]  Approved for 7 days when f/u is requested  Employer: [employer] insurance Benefits:  Phone #: [phone]     Name:  Eff. Date: active 07/05/2020     Deduct: $2500      Out of Pocket Max: $7500 includes deductible CIR: 70%      SNF:  Outpatient:      Co-Pay:  Home Health:       Co-Pay:  DME:      Co-Pay:  Providers: in network benefits only  SECONDARY:       Policy#:      Phone#:    Development worker, community:       Phone#:    The Engineer, petroleum" for patients in Inpatient Rehabilitation Facilities with attached "Privacy Act [place] Records" was provided and verbally reviewed with: N/A   Emergency Contact Information         Contact Information     Name Relation Home Work [place], [place] Brother     [place], [place] [phone]             Current Medical History  Patient Admitting Diagnosis: debility with metastatic prostate  cancer   History of Present Illness:Chris Morales is a 64 year old right-handed male with history of prostate cancer diagnosed June 2020 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   Patient with complicated course he was recently hospitalized at [facility] in [place] from 10/17-10/25 for shortness of breath and atrial fibrillation he did require intubation for a short time.  Attempts were made at chemical cardioversion that failed and was placed on Eliquis.  Reportedly after patient was extubated he was having some left side weakness particularly left upper extremity.  He was frustrated as to the delay in getting an MRI of his brain decided to leave AMA early morning of 06/24/2020 and return to the [place] area to be closer to his brother who is a retired Software engineer and patient immediately after landing in the [place] area was brought to [facility] for ongoing evaluation.  In the ED he was found to still be in rapid atrial fibrillation.  Chest x-ray showed substantial bilateral pleural effusions with evidence of diffuse metastatic lesions in the bones.  CT angiogram of the chest negative for pulmonary emboli.  He had a markedly elevated  D-dimer of 19.33.  MRI of the brain showed no acute infarction however there was some thickening enhancement along the posterior falx cerebri with possible minimal associated parenchymal edema.  Attempts were made at MR C-spine but patient was unable to tolerate.  Admission chemistries glucose 153, calcium 7.5, alkaline phosphatase 2755, BNP 281, urine culture no growth, digoxin level 1.0, troponin negative, hemoglobin 9.0, WBC 8.4.  Patient currently remains on Lanoxin as well as Cardizem 30 mg every 8 hours with Eliquis ongoing as well as Toprol-XL 100 mg twice daily.  Echocardiogram with ejection fraction of 50 to 55% no wall  motion abnormalities.  Patient did undergo IR ultrasound-guided right thoracentesis on 06/27/2020 with 1.6 L dark yellow fluid removed and left thoracentesis 06/28/2020 with 1.3 L bright yellow fluid removed and cultures continue to show no growth.  Latest chest x-ray 07/01/2020 showed no significant recurrence of left pleural fluid since thoracentesis stable bibasilar atelectasis.  Oncology services Dr. Jana Hakim have been consulted in regards to prostate cancer and remains on Casodex and current plans to follow-up outpatient with Dr. [name] as well as palliative care established with plan to repeat MRI of the brain in 2 months per Dr. Mickeal Skinner.  A Foley catheter tube currently remains in place for urinary retention awaiting plan for voiding trial.  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  Multilevel neural foraminal narrowing greatest on the left at C3-4 moderate to severe and C4-5 moderate.    Patient's medical record from [facility] [place]  has been reviewed by the rehabilitation admission coordinator and physician.   Past Medical History      Past Medical History:  Diagnosis Date  . Prostate CA [facility])        Family History   family history includes Prostate cancer in his brother.   Prior Rehab/Hospitalizations Has the patient had prior rehab or hospitalizations prior to admission? Yes   Has the patient had major surgery during 100 days prior to admission? Yes              Current Medications   Current Facility-Administered Medications:  .  acetaminophen (TYLENOL) tablet 650 mg, 650 mg, Oral, Q4H PRN, Shalhoub, Sherryll Burger, MD .  apixaban (ELIQUIS) tablet 5 mg, 5 mg, Oral, BID, [place] Donnamarie Poag, DO, 5 mg at 07/05/20 0949 .  bicalutamide (CASODEX) tablet 50 mg, 50 mg, Oral, Daily, Magrinat, Virgie Dad,  MD, 50 mg at 07/05/20 0949 .  Chlorhexidine Gluconate Cloth 2 % PADS 6 each, 6 each, Topical, Daily, Annita Brod, MD, 6 each at 07/03/20 1024 .  digoxin (LANOXIN) tablet 0.25 mg, 0.25 mg, Oral, Daily, Annita Brod, MD, 0.25 mg at 07/05/20 0949 .  diltiazem (CARDIZEM) tablet 30 mg, 30 mg, Oral, Q8H, [place] Donnamarie Poag, DO, 30 mg at 07/05/20 2500 .  levothyroxine (SYNTHROID) tablet 25 mcg, 25 mcg, Oral, QAC breakfast, Annita Brod, MD, 25 mcg at 07/05/20 [phone] .  lidocaine (PF) (XYLOCAINE) 1 % injection, , , PRN, Covington, Jamie R, NP, 15 mL at 06/27/20 1112 .  lidocaine (XYLOCAINE) 1 % (with pres) injection, , Infiltration, PRN, Covington, Jamie R, NP, 10 mL at 06/28/20 1032 .  metoprolol succinate (TOPROL-XL) 24 hr tablet 100 mg, 100 mg, Oral, BID, [place] Donnamarie Poag, DO, 100 mg at 07/05/20 0949 .  ondansetron (ZOFRAN) injection 4  mg, 4 mg, Intravenous, Q6H PRN, Shalhoub, Sherryll Burger, MD .  polyethylene glycol (MIRALAX / GLYCOLAX) packet 17 g, 17 g, Oral, Daily PRN, Shalhoub, Sherryll Burger, MD   Patients Current Diet:     Diet Order                      Diet Heart Room service appropriate? Yes; Fluid consistency: Thin  Diet effective now                      Precautions / Restrictions Precautions Precautions: Fall Precaution Comments: monitor O2 sats Restrictions Weight Bearing Restrictions: No    Has the patient had 2 or more falls or a fall with injury in the past year? 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 Independent   Indoor Mobility: Did the patient need assistance with walking from room to room (with or without device)? Independent   Stairs: Did the patient need assistance with internal or external stairs (with or without device)? Independent   Functional Cognition: Did the patient need help planning  regular tasks such as shopping or remembering to take medications? Independent   Home Assistive Devices / Equipment Home Equipment: None   Prior Device Use: Indicate devices/aids used by the patient prior to current illness, exacerbation or injury? None of the above   Current Functional Level Cognition   Overall Cognitive Status: Impaired/Different from baseline Current Attention Level: Focused Orientation Level: Oriented X4 Following Commands: Follows one step commands consistently Safety/Judgement: Decreased awareness of deficits General Comments: continues to require step by step instruction- appears to have difficulty following multistep/higher level instruction    Extremity Assessment (includes Sensation/Coordination)   Upper Extremity Assessment: RUE deficits/detail, LUE deficits/detail RUE Deficits / Details: Pt states he surffered a rotator cuff injury @ 4 months ago and his PCP had set him up with "therapy". Has not seen ortho MD for this yet. elbow/wrist/hand WFL; no ability to flex, abd or exteranlly rotate RUE; scapular ROM WFL RUE Sensation: WNL RUE Coordination: decreased gross motor LUE Deficits / Details: overall weaker than R regardindg elbow/wrist and hand. grip @ 3+/5; Difficulty achieveing full composte extension with wrist extension - extensor lag.; elbow flex 4/5; extension 2+/5 - unable to extend against gravity; weak shoulder girdle with notable substitution, however using functionally. Note dysmetria LUE Coordination: decreased fine motor, decreased gross motor  Lower Extremity Assessment: Defer to PT evaluation (LLE weaker than R; especially proximally)     ADLs   Overall ADL's : Needs assistance/impaired Eating/Feeding: Set up, Sitting Grooming: Min guard, Standing Grooming Details (indicate cue type and reason): standing at sink with intermittent UE bracing for oral care Upper Body Bathing: Set up, Sitting Lower Body Bathing: Sit to/from stand, Moderate  assistance Lower Body Bathing Details (indicate cue type and reason): able to complete figure four positioning Upper Body Dressing : Minimal assistance, Sitting Lower Body Dressing: Moderate assistance, Sit to/from stand Lower Body Dressing Details (indicate cue type and reason): ot don socks seated EOB Toilet Transfer: Min guard, Ambulation, RW Toilet Transfer Details (indicate cue type and reason): able to power up with min guard this session, which is improvement from previous sessions Toileting- Clothing Manipulation and Hygiene: Moderate assistance Toileting - Clothing Manipulation Details (indicate cue type and reason): foley cath Functional mobility during ADLs: Min guard,  Rolling walker General ADL Comments: pt able to progress in ADL mobility, also focus on BUE HEP     Mobility   Overal bed mobility: Needs Assistance Bed Mobility: Rolling, Sidelying to Sit Rolling: Min guard Sidelying to sit: Min assist Supine to sit: Min guard, HOB elevated Sit to supine: Min assist General bed mobility comments: Pt seated in recliner on arrival.     Transfers   Overall transfer level: Needs assistance Equipment used: Rolling walker (2 wheeled) Transfers: Sit to/from Stand Sit to Stand: Min guard General transfer comment: pt able to progress to power up of min guard assist with RW. Use of bil hand rests to push     Ambulation / Gait / Stairs / Wheelchair Mobility   Ambulation/Gait Ambulation/Gait assistance: Counsellor (Feet): 120 Feet (standing rest break after 20 ft for around 30 seconds.) Assistive device: Rolling walker (2 wheeled) Gait Pattern/deviations: Step-through pattern, Wide base of support, Trunk flexed General Gait Details: Cues for upper trunk control and scapular retraction.  Pt continues to move with slow gt speed and noticeable fatigue. Gait velocity: reduced Gait velocity interpretation: <1.31 ft/sec, indicative of household ambulator     Posture /  Balance Dynamic Sitting Balance Sitting balance - Comments: apparent weak core Balance Overall balance assessment: Needs assistance Sitting-balance support: Feet supported Sitting balance-Leahy Scale: Fair Sitting balance - Comments: apparent weak core Standing balance support: During functional activity, Single extremity supported Standing balance-Leahy Scale: Poor Standing balance comment: at least one UE supported during standing ADLs     Special needs/care consideration New oncology follow up planned as outpatient here in [place] To stay with local brother per brother; patient still talks of returning to his practice in [place]:  (lived alone in [place])  Lives With: Alone Available Help at Discharge: Family, Available 24 hours/day (sister in Sports coach and brother to provide 24/7 supervision) Type of Home: House Home Layout: One level Home Access: Stairs to enter [employer] Shower/Tub: Multimedia programmer: Standard Bathroom Accessibility: Yes How Accessible: Accessible via walker Home Care Services: No Additional Comments: to stay with brother in [place] at discharge   Discharge Living Setting Plans for Discharge Living Setting: Lives with (comment) (to stay with brother in [place] at d/c. Not to return to [place] per ) Type of Home at Discharge: House Discharge Home Layout: One level Discharge Home Access: Stairs to enter Discharge Bathroom Shower/Tub: Walk-in shower Discharge Bathroom Toilet: Standard Discharge Bathroom Accessibility: Yes How Accessible: Accessible via walker Does the patient have any problems obtaining your medications?: No   Social/Family/Support Systems Contact Information: brother , Dr. Danella Deis is health Care POA Anticipated Caregiver: brother and sister in law Anticipated Caregiver's Contact Information: see above Caregiver Availability: 24/7 Discharge Plan Discussed with Primary  Caregiver: Yes Is Caregiver In Agreement with Plan?: Yes Does Caregiver/Family have Issues with Lodging/Transportation while Pt is in Rehab?: No   Goals Patient/Family Goal for Rehab: mod I to supervision with PT, OT, and SLP Expected length of stay: ELOS 5 to 7 days Pt/Family Agrees to Admission and willing to participate: Yes Program Orientation Provided & Reviewed with Pt/Caregiver Including Roles  & Responsibilities: Yes   Decrease burden of Care through IP rehab admission: n/a   Possible need for SNF placement upon discharge: not anticipated   Patient Condition: I have reviewed medical [facility] from [facility] , spoken with CM, and patient and family  member. I met with patient at the bedside for inpatient rehabilitation assessment.  Patient will benefit from ongoing PT, OT and SLP, can actively participate in 3 hours of therapy a day 5 days of the week, and can make measurable gains during the admission.  Patient will also benefit from the coordinated team approach during an Inpatient Acute Rehabilitation admission.  The patient will receive intensive therapy as well as Rehabilitation physician, nursing, social worker, and care management interventions.  Due to bladder management, bowel management, safety, skin/wound care, disease management, medication administration, pain management and patient education the patient requires 24 hour a day rehabilitation nursing.  The patient is currently min to mod assist overall with mobility and basic ADLs.  Discharge setting and therapy post discharge at home with home health is anticipated.  Patient has agreed to participate in the Acute Inpatient Rehabilitation Program and will admit today.   Preadmission Screen Completed By:  Cleatrice Burke, 07/05/2020 12:42 PM ______________________________________________________________________   Discussed status with Dr. Ranell Patrick on  07/05/2020  at  1259 and received approval for admission today.    Admission Coordinator:  Cleatrice Burke, RN, time  4730 Date  07/05/2020    Assessment/Plan: Diagnosis: Debility secondary to Afib with RVR 1. Does the need for close, 24 hr/day Medical supervision in concert with the patient's rehab needs make it unreasonable for this patient to be served in a less intensive setting? Yes 2. 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 Due to bladder management, bowel management, safety, skin/wound care, disease management, medication administration, pain management and patient education, does the patient require 24 hr/day rehab nursing? Yes 4. Does the patient require coordinated care of a physician, rehab nurse, PT, OT  to address physical and functional deficits in the context of the above medical diagnosis(es)? Yes Addressing deficits in the following areas: balance, endurance, locomotion, strength, transferring, bowel/bladder control, bathing, dressing, feeding, grooming, toileting and psychosocial support 5. Can the patient actively participate in an intensive therapy program of at least 3 hrs of therapy 5 days a week? Yes 6. The potential for patient to make measurable gains while on inpatient rehab is excellent 7. Anticipated functional outcomes upon discharge from inpatient rehab: modified independent PT, modified independent OT, independent SLP 8. Estimated rehab length of stay to reach the above functional goals is: modI 6-8 days 9. Anticipated discharge destination: Home 10. Overall Rehab/Functional Prognosis: excellent     MD Signature: Leeroy Cha, MD        Revision History                     Note Details  Author Chris Ribas, MD File Time 07/05/2020  1:28 PM  Author Type Physician Status Signed  Last Editor Chris Ribas, MD Service  Physical Medicine and Rehabilitation

## 2020-07-06 ENCOUNTER — Inpatient Hospital Stay (HOSPITAL_COMMUNITY): Payer: 59 | Admitting: Occupational Therapy

## 2020-07-06 ENCOUNTER — Inpatient Hospital Stay (HOSPITAL_COMMUNITY): Payer: 59

## 2020-07-06 DIAGNOSIS — I4891 Unspecified atrial fibrillation: Secondary | ICD-10-CM

## 2020-07-06 NOTE — Progress Notes (Signed)
[facility]IC[facility]ILIT[facility] PROGRESS NOTE   Subjective/Complaints: Patient complains of needing to wear belt in chair- states it makes him feel claustrophobic. States that he will not get out of bed on his own. Discussed with RN trying bed alarm instead.  Appreciate pharmacy med review  ROS: Denies pain   Objective:   MR CERVICAL SPINE W WO CONTRAST  Addendum Date: 07/05/2020   ADDENDUM REPORT: 07/05/2020 13:24 ADDENDUM: I was subsequently contacted by Dr. Curly Shores of neurology who directed my attention to subtle dural thickening and enhancement at the C5-C6 level. The dura measures 1-2 mm in thickness at this level. This finding may be postoperative in etiology. Trace dural-based tumor is difficult to definitively exclude. Consider contrast-enhanced MRI follow-up for surveillance. No resultant significant spinal cord mass effect at this level. This addendum was discussed with Dr. Curly Shores by telephone at approximately 1:30 p.m. on 07/05/2020. Electronically Signed   By: Kellie Simmering DO   On: 07/05/2020 13:24   Result Date: 07/05/2020 CLINICAL DATA:  Myelopathy, acute or progressive; left arm weakness. Additional history provided: History of prostate cancer metastatic to bone, status post cervical laminectomy with tumor removal in June 2020. EXAM: MRI CERVICAL SPINE WITHOUT AND WITH CONTRAST TECHNIQUE: Multiplanar and multiecho pulse sequences of the cervical spine, to include the craniocervical junction and cervicothoracic junction, were obtained without and with intravenous contrast. CONTRAST:  85mL GADAVIST GADOBUTROL 1 MMOL/ML IV SOLN COMPARISON:  Radiographs of the cervical spine 02/20/2019. Cervical spine MRI 02/19/2019. Cervical spine MRI 02/20/2019. FINDINGS: Alignment: Reversal of the expected cervical lordosis. No significant spondylolisthesis. Vertebrae: Vertebral body height is maintained. Diffuse metastatic disease throughout the cervical spine. Since the prior MRI of  02/20/2019, there has been interval C4, C5 and C6 laminectomies for resection of epidural metastatic tumor and microdiscectomy. Multilevel degenerative endplate irregularity. Cord: No spinal cord signal abnormality. Posterior Fossa, vertebral arteries, paraspinal tissues: No abnormality identified within included portions of the posterior fossa. Bilateral mastoid effusions. Flow voids preserved within the imaged cervical vertebral arteries. Paraspinal soft tissues within normal limits. Unchanged nonspecific 13 mm round T2 hyperintense subcutaneous lesion within the posterior right upper back (series 8, image 36). Disc levels: Unless otherwise stated, the level by level findings below have not significantly changed since prior MRIs of 02/20/2019 and 02/19/2019. Severe disc degeneration at C3-C4, C4-C5, C5-C6, C6-C7 and C7-T1. C2-C3: No significant disc herniation or stenosis. C3-C4: Disc bulge. Uncovertebral and facet hypertrophy. The disc bulge effaces the ventral thecal sac, contacting and minimally flattening the ventral spinal cord with overall mild relative spinal canal narrowing. Moderate/severe left neural foraminal narrowing. C4-C5: Interval laminectomies for resection of previous dorsal epidural tumor. Posterior disc osteophyte complex. Uncinate and facet hypertrophy. No significant spinal canal stenosis. Bilateral neural foraminal narrowing (mild right, moderate left). C5-C6: Interval laminectomies for resection of previous dorsal epidural tumor. Posterior disc osteophyte complex. Uncinate and facet hypertrophy. The disc osteophyte complex minimally effaces the ventral thecal sac without significant spinal canal stenosis. Mild/moderate bilateral neural foraminal narrowing. C6-C7: Posterior disc osteophyte complex. Uncinate and facet hypertrophy. Mild spinal canal stenosis. Bilateral neural foraminal narrowing (mild right, moderate left). C7-T1: This level is imaged sagittally. No significant spinal canal  stenosis. Mild bilateral neural foraminal narrowing. IMPRESSION: Since the prior MRI of 02/20/2019, there has been interval C4, C5 and C6 laminectomies for resection of previous dorsal epidural metastatic tumor. No evidence of recurrent epidural tumor at this site. No convincing evidence of epidural metastatic disease within the cervical spine. Redemonstrated diffuse  osseous metastatic disease throughout the cervical spine. Cervical spondylosis as outlined and having not significantly changed from the cervical spine MRI of 02/20/2020. Severe disc degeneration throughout the majority of the cervical spine. No more than mild spinal canal stenosis at any level. At C3-C4, a disc bulge contributes to mild relative spinal canal narrowing, contacting and minimally flattening the ventral spinal cord. Multilevel neural foraminal narrowing greatest on the left at C3-C4 (moderate/severe), on the left at C4-C5 (moderate) and on the left at C6-C7 (moderate). Electronically Signed: By: Kellie Simmering DO On: 07/05/2020 11:43   No results for input(s): WBC, HGB, HCT, PLT in the last 72 hours. No results for input(s): NA, K, CL, CO2, GLUCOSE, BUN, CREATININE, CALCIUM in the last 72 hours.  Intake/Output Summary (Last 24 hours) at 07/06/2020 1118 Last data filed at 07/06/2020 0636 Gross per 24 hour  Intake 360 ml  Output 355 ml  Net 5 ml        Physical Exam: Vital Signs Blood pressure 118/73, pulse 60, temperature 97.8 F (36.6 C), resp. rate 16, height 6\' 1"  (1.854 m), weight 72.1 kg, SpO2 99 %.  General: Alert and oriented x 3, No apparent distress HEENT: Head is normocephalic, atraumatic, PERRLA, EOMI, sclera anicteric, oral mucosa pink and moist, dentition intact, ext ear canals clear,  Neck: Supple without JVD or lymphadenopathy Heart: Reg rate and rhythm. No murmurs rubs or gallops Chest: CTA bilaterally without wheezes, rales, or rhonchi; no distress Abdomen: Soft, non-tender, non-distended, bowel sounds  positive. Extremities: No clubbing, cyanosis, or edema. Pulses are 2+ Skin: Clean and intact without signs of breakdown Neuro: Pt is cognitively appropriate with normal insight, memory, and awareness. Cranial nerves 2-12 are intact. Sensory exam is normal. Reflexes are 2+ in all 4's. Fine motor coordination is intact. No tremors. Motor function is grossly 4/5 with exception of 1/5 EE in LUE Musculoskeletal: Full ROM, No pain with AROM or PROM in the neck, trunk, or extremities. Posture appropriate Psych: Pt's affect is appropriate. Pt is cooperative    Assessment/Plan: 1. Functional deficits secondary to Afib with RVR which require 3+ hours per day of interdisciplinary therapy in a comprehensive inpatient rehab setting.  Physiatrist is providing close team supervision and 24 hour management of active medical problems listed below.  Physiatrist and rehab team continue to assess barriers to discharge/monitor patient progress toward functional and medical goals  Care Tool:  Bathing              Bathing assist       Upper Body Dressing/Undressing Upper body dressing        Upper body assist      Lower Body Dressing/Undressing Lower body dressing            Lower body assist       Toileting Toileting    Toileting assist Assist for toileting: Supervision/Verbal cueing     Transfers Chair/bed transfer  Transfers assist     Chair/bed transfer assist level: Minimal Assistance - Patient > 75%     Locomotion Ambulation   Ambulation assist      Assist level: Minimal Assistance - Patient > 75% Assistive device: No Device Max distance: 200'   Walk 10 feet activity   Assist     Assist level: Minimal Assistance - Patient > 75% Assistive device: No Device   Walk 50 feet activity   Assist    Assist level: Minimal Assistance - Patient > 75% Assistive device: No Device  Walk 150 feet activity   Assist    Assist level: Minimal Assistance -  Patient > 75% Assistive device: No Device    Walk 10 feet on uneven surface  activity   Assist     Assist level: Minimal Assistance - Patient > 75%     Wheelchair     Assist Will patient use wheelchair at discharge?: No             Wheelchair 50 feet with 2 turns activity    Assist            Wheelchair 150 feet activity     Assist          Blood pressure 118/73, pulse 60, temperature 97.8 F (36.6 C), resp. rate 16, height 6\' 1"  (1.854 m), weight 72.1 kg, SpO2 99 %.    Medical Problem List and Plan: 1.  Decreased functional mobility secondary to acute respiratory failure decompensated diastolic congestive heart failure bilateral pleural effusions status post thoracentesis             -patient may shower             -ELOS/Goals: modI 5-7 days  Initial CIR evaluations today 2.  Antithrombotics: -DVT/anticoagulation: Eliquis mg BID             -antiplatelet therapy: N/A 3. Pain Management: Tylenol as needed. Well controlled 4. Mood: Provide emotional support             -antipsychotic agents: N/A 5. Neuropsych: This patient is capable of making decisions on his own behalf. Patient refuses chair belt but states that he will not get out of chair on his own and will call for help when needed. Chair belt makes him claustrophobic. Bed alarm in place 6. Skin/Wound Care: Routine skin checks 7. Fluids/Electrolytes/Nutrition: Routine in and outs with follow-up chemistries 8.  Prostate cancer with bony metastasis.  Plans to follow-up outpatient Dr. [name] as well as Dr. Mickeal Skinner and plan to repeat MRI of the brain 2 months.  Continue Casodex 50 mg daily 9.  Atrial fibrillation with RVR.  Lanoxin 0.25 mg daily, Cardizem 30 mg every 8 hours, Toprol-XL 100 mg twice daily. HR is well controlled 10. Acute on chronic anemia. Latest hemoglobin 8.4. Follow-up CBC 11.  Hypothyroidism.  Synthroid 55mcg daily.  Latest TSH 7.345.  Synthroid was recently initiated while in  [place] earlier this month.  Plan to repeat TFTs 4 to 6 weeks. 12.  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  Foley catheter tube placed in [place].DO NOT REMOVE FOLEY TUBE FOR NOW 13. S/p cervical laminectomy with tumor removal in June 2020. Repeat MRI cervical spine 11/5 was ordered for left upper extremity weakness and did not show recurrent epidural tumor; it does show diffuse osseous metastatic disease throughout the cervical spine with multilevle neural foraminal narrowing greateres on the left at C3-C4 > C4-C5: monitor for pain, left sided weakness, neurological deficits  LOS: 1 days A FACE TO FACE EVALUATION WAS PERFORMED  Jarian Longoria P Khristi Schiller 07/06/2020, 11:18 AM

## 2020-07-06 NOTE — Evaluation (Signed)
Occupational Therapy Assessment and Plan  Patient Details  Name: Chris Morales MRN: [id number] Date of Birth: [birth year]  OT Diagnosis: muscle weakness (generalized), swelling of limb and coordination disorder Rehab Potential: Rehab Potential (ACUTE ONLY): Good ELOS: 7-10 days   Today's Date: 07/06/2020 OT Individual Time: 6433-2951 and [id number] OT Individual Time Calculation (min): 74 min  And 28 min  Hospital Problem: Principal Problem:   Atrial fibrillation with rapid ventricular response ([place]) Active Problems:   Cervical myelopathy ([place])   AKI (acute kidney injury) ([place])   Prostate CA ([place])   Malignant neoplasm of prostate metastatic to bone ([place])   Metastasis to spinal column ([place])   Euthyroid sick syndrome   Left-sided weakness   Pleural effusion, bilateral   Debility   Past Medical History:  Past Medical History:  Diagnosis Date  . Hypothyroidism   . Prostate CA [facility])    Past Surgical History:  Past Surgical History:  Procedure Laterality Date  . IR THORACENTESIS ASP PLEURAL SPACE W/IMG GUIDE  06/27/2020  . IR THORACENTESIS ASP PLEURAL SPACE W/IMG GUIDE  06/28/2020  . LAMINECTOMY Bilateral 02/20/2019   Procedure: CERVICAL LAMINECTOMY FOR TUMOR;  Surgeon: Earnie Larsson, MD;  Location: [facility];  Service: Neurosurgery;  Laterality: Bilateral;  CERVICAL LAMINECTOMY FOR TUMOR  . LIGAMENT REPAIR Left     Assessment & Plan Clinical Impression: Chris Morales is a 64 year old right-handed male with history of prostate cancer diagnosed June 2020 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  He does have family in the [place] area who assist as needed.  Patient with complicated course he was recently hospitalized at [facility] in [place] from 10/17-10/25 for shortness of breath and atrial fibrillation he did require intubation for a short time.  Attempts were made at chemical cardioversion that failed and was placed on Eliquis.  Reportedly after patient was extubated he was having some left side weakness particularly left upper extremity.  He was frustrated as to the delay in getting an MRI of his brain decided to leave AMA early morning of 06/24/2020 and return to the [place] area to be closer to his brother who is a retired Software engineer and patient immediately after landing in the [place] area was brought to [facility] for ongoing evaluation.  In the ED he was found to still be in rapid atrial fibrillation.  Chest x-ray showed substantial bilateral pleural effusions with evidence of diffuse metastatic lesions in the bones.  CT angiogram of the chest negative for pulmonary emboli.  He had a markedly elevated D-dimer of 19.33.  MRI of the brain showed no acute infarction however there was some thickening enhancement along the posterior falx cerebri with possible minimal associated parenchymal edema.  Attempts were made at MR C-spine but patient was unable to tolerate.  Admission chemistries glucose 153, calcium 7.5, alkaline phosphatase 2755, BNP 281, urine culture no growth, digoxin level 1.0, troponin negative, hemoglobin 9.0, WBC 8.4.  Patient currently remains on Lanoxin as well as Cardizem 30 mg every 8 hours with Eliquis ongoing as well as Toprol-XL 100 mg twice daily.  Echocardiogram with ejection fraction of 50 to 55% no wall motion abnormalities.  Patient did undergo IR ultrasound-guided right thoracentesis on 06/27/2020 with 1.6 L dark yellow fluid  removed and left thoracentesis 06/28/2020 with 1.3 L bright yellow fluid removed and cultures continue to show no growth.  Latest chest x-ray 07/01/2020 showed no significant recurrence of left pleural fluid since thoracentesis stable bibasilar  atelectasis.  Oncology services Dr. Jana Hakim have been consulted in regards to prostate cancer and remains on Casodex and current plans to follow-up outpatient with Dr. [name] as well as palliative care established with plan to repeat MRI of the brain in 2 months per Dr. Mickeal Skinner.  A Foley catheter tube currently remains in place for urinary retention awaiting plan for voiding trial.  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  Multilevel neural foraminal narrowing greatest on the left at C3-4 moderate to severe and C4-5 moderate.  Therapy evaluations completed and patient was admitted for a comprehensive rehab program.   Patient currently requires min-mod with basic self-care skills secondary to muscle weakness, decreased cardiorespiratoy endurance and edema, decreased problem solving and decreased standing balance, decreased postural control and decreased balance strategies.  Prior to hospitalization, patient could complete BADLs with independent .  Patient will benefit from skilled intervention to increase independence with basic self-care skills prior to discharge home with brother.  Anticipate patient will require intermittent supervision and follow up home health.  OT - End of Session Endurance Deficit: Yes Endurance Deficit Description: Pt requiring multiple seated rest breaks during BADL session OT Assessment Rehab Potential (ACUTE ONLY): Good OT Barriers to Discharge: Home environment access/layout;Other (comments) (8 STE + foley care) OT Patient demonstrates impairments in the following area(s): Balance;Safety;Edema;Endurance;Motor OT Basic ADL's Functional Problem(s): Grooming;Bathing;Dressing;Toileting OT Advanced ADL's Functional Problem(s): Simple Meal Preparation;Light Housekeeping OT Transfers  Functional Problem(s): Toilet;Tub/Shower OT Additional Impairment(s): Fuctional Use of Upper Extremity OT Plan OT Intensity: Minimum of 1-2 x/day, 45 to 90 minutes OT Frequency: 5 out of 7 days OT Duration/Estimated Length of Stay: 7-10 days OT Treatment/Interventions: Balance/vestibular training;Discharge planning;Pain management;Therapeutic Activities;UE/LE Coordination activities;Self Care/advanced ADL retraining;Cognitive remediation/compensation;Disease mangement/prevention;Functional mobility training;Patient/family education;Therapeutic Exercise;UE/LE Strength taining/ROM;Psychosocial support;Neuromuscular re-education;DME/adaptive equipment instruction;Community reintegration OT Self Feeding Anticipated Outcome(s): No goal OT Basic Self-Care Anticipated Outcome(s): Supervision/setup-Mod I OT Toileting Anticipated Outcome(s): Mod I OT Bathroom Transfers Anticipated Outcome(s): Supervision/setup-Mod I OT Recommendation Recommendations for Other Services: Neuropsych consult;Therapeutic Recreation consult Therapeutic Recreation Interventions: Clinical cytogeneticist;Outing/community reintergration Patient destination: Home Follow Up Recommendations: Home health OT Equipment Recommended: To be determined   OT Evaluation Precautions/Restrictions  Precautions Precaution Comments: edema in LLE, RTC injury in the Rt shoulder Restrictions Weight Bearing Restrictions: No Pain Pain Assessment Pain Score: 0-No pain Home Living/Prior Functioning Home Living Available Help at Discharge: Family, Available 24 hours/day Type of Home: House Home Access: Stairs to enter (8 STE) Home Layout: Able to live on main level with bedroom/bathroom Bathroom Shower/Tub: Multimedia programmer: Standard Bathroom Accessibility: Yes  Lives With: Alone IADL History Homemaking Responsibilities: Yes (pt reports being fully independent in regards to household management PTA) Occupation:  Full time employment Type of Occupation: working in Journalist, newspaper Prior Function Level of Independence: Independent with basic ADLs, Independent with gait, Independent with transfers, Independent with homemaking with ambulation  Able to Take Stairs?: Yes Driving: Yes Vocation: Full time employment Vision Baseline Vision/History: Wears glasses (Used to wear glasses but has stopped wearing them) Patient Visual Report: No change from baseline Perception  Perception: Within Functional Limits Praxis Praxis: Intact Cognition Overall Cognitive  Status: Impaired/Different from baseline Arousal/Alertness: Awake/alert Orientation Level: Person;Place;Situation Person: Oriented Place: Oriented Situation: Oriented Year: 2021 Month: November Day of Week: Correct Memory: Impaired Memory Impairment: Decreased recall of new information Immediate Memory Recall: Sock;Blue;Bed Memory Recall Sock: Without Cue Memory Recall Blue: Without Cue Memory Recall Bed: Without Cue Attention: Alternating Awareness: Appears intact Problem Solving: Appears intact Safety/Judgment: Appears intact Sensation Coordination Gross Motor Movements are Fluid and Coordinated: Yes Fine Motor Movements are Fluid and Coordinated: No (Deficits in the Lt hand, i.e. pt dropping wash cloth and lotion bottle during session) Finger Nose Finger Test: Able to complete on the Rt by just flexing/extending elbow (no shoulder involvement), dysmetric on the Lt side Motor  Motor Motor: Abnormal postural alignment and control Motor - Skilled Clinical Observations: gneralized weakness (L > R); decreased balance strategies  Trunk/Postural Assessment  Cervical Assessment Cervical Assessment: Within Functional Limits Thoracic Assessment Thoracic Assessment: Within Functional Limits Lumbar Assessment Lumbar Assessment: Within Functional Limits Postural Control Postural Control: Deficits on evaluation (impaired in standing,  Lt LE offloading in standing)  Balance Balance Balance Assessed: Yes Dynamic Sitting Balance Dynamic Sitting - Level of Assistance: 4: Min assist (donning gripper socks) Dynamic Standing Balance Dynamic Standing - Level of Assistance: 4: Min assist (transferring into the shower without AD) Extremity/Trunk Assessment RUE Assessment RUE Assessment: Exceptions to WFL (hx RTC injury) Active Range of Motion (AROM) Comments: Active assist shoulder flexion and abduction ~35-40 degrees, WNL AROM at elbow, wrist, and digits General Strength Comments: 4/5 biceps/triceps, grip strength 25# LUE Assessment LUE Assessment: Exceptions to [facility] Active Range of Motion (AROM) Comments: Active assist ROM ~90 degrees shoulder flexion and abduction, WNL active ROM of the elbow, wrist, and digits General Strength Comments: 3+/5 biceps/triceps, grip strength 14#  Care Tool Care Tool Self Care Eating    not assessed    Oral Care    Oral Care Assist Level: Contact Guard/Toucning assist (standing at the sink)    Bathing   Body parts bathed by patient: Right arm;Left arm;Chest;Abdomen;Front perineal area;Buttocks;Right upper leg;Left upper leg;Left lower leg;Face;Right lower leg     Assist Level: Contact Guard/Touching assist    Upper Body Dressing(including orthotics)   What is the patient wearing?: Pull over shirt   Assist Level: Minimal Assistance - Patient > 75%    Lower Body Dressing (excluding footwear)   What is the patient wearing?: Pants Assist for lower body dressing: Moderate Assistance - Patient 50 - 74%    Putting on/Taking off footwear   What is the patient wearing?: Non-skid slipper socks;Ted hose Assist for footwear: Moderate Assistance - Patient 50 - 74%       Care Tool Toileting Toileting activity   Assist for toileting: Supervision/Verbal cueing     Care Tool Bed Mobility Roll left and right activity   Roll left and right assist level: Supervision/Verbal cueing    Sit to  lying activity   Sit to lying assist level: Supervision/Verbal cueing    Lying to sitting edge of bed activity   Lying to sitting edge of bed assist level: Supervision/Verbal cueing     Care Tool Transfers Sit to stand transfer   Sit to stand assist level: Minimal Assistance - Patient > 75%    Chair/bed transfer   Chair/bed transfer assist level: Minimal Assistance - Patient > 75%     Toilet transfer   Assist Level: Minimal Assistance - Patient > 75%     Care Tool Cognition Expression of Ideas and Wants Expression of Ideas  and Wants: Without difficulty (complex and basic) - expresses complex messages without difficulty and with speech that is clear and easy to understand   Understanding Verbal and Non-Verbal Content Understanding Verbal and Non-Verbal Content: Understands (complex and basic) - clear comprehension without cues or repetitions   Memory/Recall Ability *first 3 days only      Refer to Care Plan for [place] 1 OT Short Term Goal 1 (Week 1): STGs=LTGs due to ELOS  Recommendations for other services: Neuropsych and Surveyor, mining group, Stress management and Outing/community reintegration   Skilled Therapeutic Intervention Skilled OT session completed with focus on initial evaluation, education on OT role/POC, and establishment of patient-centered goals.   Pt greeted in the recliner with no c/o pain. Agreeable to session. Started by ambulating to the bathroom without AD and Min A. Note that when pt walked to the door, he stated he need to sit due to some dizziness/fatigue and took 1 seated rest break before fully making it to the shower. He then bathed at sit<stand level with CGA, min vcs for sequencing and thoroughness. He dressed sit<stand from the standard toilet, needed both UEs to power up into standing. Assistance required for threading catheter/Lt LE into pants due to tightness of fit. Discussed having family bring  looser fitting clothing into CIR this week. Min A for threading head into shirt due to decreased shoulder ROM bilaterally. Oral care completed while standing at the sink, pt with visible unloading of the Lt side and exhibiting Rt lean. He then returned to the recliner and was left with all needs within reach and chair alarm set.   2nd Session 1:1 tx (28 min) Pt greeted while transferring to the [facility] with NT. Asked pt if he'd prefer to ambulate to the toilet however he declined, stating he was more comfortable using the [facility]. Pt voided bowels and needed assistance for hygiene though he did attempt himself with the Rt hand. Pt was wearing a hospital gown so clothing mgt n/a. CGA for stand pivot transfer back to the recliner where UE assessment was continued via Dynamometer testing. See eval for details.Transitioned to bilateral shoulder ROM using the UE ranger while OT facilitated scapular movement. Pt remained in the recliner at close of session, left with all needs within reach and chair alarm set.   ADL ADL Eating: Not assessed Grooming: Contact guard Where Assessed-Grooming: Standing at sink Upper Body Bathing: Supervision/safety Where Assessed-Upper Body Bathing: Shower Lower Body Bathing: Contact guard Where Assessed-Lower Body Bathing: Shower Upper Body Dressing: Minimal assistance Where Assessed-Upper Body Dressing: Other (Comment) (sitting on toilet) Lower Body Dressing: Moderate assistance Where Assessed-Lower Body Dressing: Other (Comment) (sitting on toilet) Toileting: Not assessed Toilet Transfer: Minimal assistance Toilet Transfer Method: Ambulating (without AD) Toilet Transfer Equipment: Energy manager: Minimal assistance Social research officer, government Method: Ambulating (without AD) Youth worker: Manufacturing systems engineer   Min A ambulatory bathroom transfers without AD   Discharge Criteria: Patient will be discharged from OT if patient refuses  treatment 3 consecutive times without medical reason, if treatment goals not met, if there is a change in medical status, if patient makes no progress towards goals or if patient is discharged from hospital.  The above assessment, treatment plan, treatment alternatives and goals were discussed and mutually agreed upon: by patient  Skeet Simmer 07/06/2020, 12:47 PM

## 2020-07-06 NOTE — Evaluation (Signed)
Physical Therapy Assessment and Plan  Patient Details  Name: Chris Morales MRN: [id number] Date of Birth: 1955-10-03  PT Diagnosis: Difficulty walking, Edema, Impaired sensation and Muscle weakness Rehab Potential: Good ELOS: 5-7 days   Today's Date: 07/06/2020 PT Individual Time: 0947-0962 PT Individual Time Calculation (min): 60 min    Hospital Problem: Principal Problem:   Atrial fibrillation with rapid ventricular response ([place]) Active Problems:   Cervical myelopathy ([place])   AKI (acute kidney injury) ([place])   Prostate CA ([place])   Malignant neoplasm of prostate metastatic to bone ([place])   Metastasis to spinal column ([place])   Euthyroid sick syndrome   Left-sided weakness   Pleural effusion, bilateral   Debility   Past Medical History:  Past Medical History:  Diagnosis Date  . Hypothyroidism   . Prostate CA [facility])    Past Surgical History:  Past Surgical History:  Procedure Laterality Date  . IR THORACENTESIS ASP PLEURAL SPACE W/IMG GUIDE  06/27/2020  . IR THORACENTESIS ASP PLEURAL SPACE W/IMG GUIDE  06/28/2020  . LAMINECTOMY Bilateral 02/20/2019   Procedure: CERVICAL LAMINECTOMY FOR TUMOR;  Surgeon: Earnie Larsson, MD;  Location: [facility];  Service: Neurosurgery;  Laterality: Bilateral;  CERVICAL LAMINECTOMY FOR TUMOR  . LIGAMENT REPAIR Left     Assessment & Plan Clinical Impression: Patient is a 64 year old right-handed male with history of prostate cancer diagnosed June 2020 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  He does have family in the [place] area who assist as needed.  Patient with complicated course he was recently hospitalized at [facility] in [place] from 10/17-10/25 for shortness of breath  and atrial fibrillation he did require intubation for a short time.  Attempts were made at chemical cardioversion that failed and was placed on Eliquis.  Reportedly after patient was extubated he was having some left side weakness particularly left upper extremity.  He was frustrated as to the delay in getting an MRI of his brain decided to leave AMA early morning of 06/24/2020 and return to the [place] area to be closer to his brother who is a retired Software engineer and patient immediately after landing in the [place] area was brought to [facility] for ongoing evaluation.  In the ED he was found to still be in rapid atrial fibrillation.  Chest x-ray showed substantial bilateral pleural effusions with evidence of diffuse metastatic lesions in the bones.  CT angiogram of the chest negative for pulmonary emboli.  He had a markedly elevated D-dimer of 19.33.  MRI of the brain showed no acute infarction however there was some thickening enhancement along the posterior falx cerebri with possible minimal associated parenchymal edema.  Attempts were made at MR C-spine but patient was unable to tolerate.  Admission chemistries glucose 153, calcium 7.5, alkaline phosphatase 2755, BNP 281, urine culture no growth, digoxin level 1.0, troponin negative, hemoglobin 9.0, WBC 8.4.  Patient currently remains on Lanoxin as well as Cardizem 30 mg every 8 hours with Eliquis ongoing as well as Toprol-XL 100 mg twice daily.  Echocardiogram with ejection fraction of 50 to 55% no wall motion abnormalities.  Patient did undergo IR ultrasound-guided right thoracentesis on 06/27/2020 with 1.6 L dark yellow fluid removed and left thoracentesis 06/28/2020 with 1.3 L bright yellow  fluid removed and cultures continue to show no growth.  Latest chest x-ray 07/01/2020 showed no significant recurrence of left pleural fluid since thoracentesis stable bibasilar atelectasis.  Oncology services Dr. Jana Hakim have been consulted in regards to  prostate cancer and remains on Casodex and current plans to follow-up outpatient with Dr. [name] as well as palliative care established with plan to repeat MRI of the brain in 2 months per Dr. Mickeal Skinner.  A Foley catheter tube currently remains in place for urinary retention awaiting plan for voiding trial.  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  Multilevel neural foraminal narrowing greatest on the left at C3-4 moderate to severe and C4-5 moderate.  Therapy evaluations completed. Patient transferred to CIR on 07/05/2020 .   Patient currently requires min with mobility secondary to muscle weakness and muscle joint tightness, decreased cardiorespiratoy endurance and decreased standing balance, decreased postural control and decreased balance strategies.  Prior to hospitalization, patient was independent  with mobility and lived with Alone in a   home.  Home access is  Level entry.  Patient will benefit from skilled PT intervention to maximize safe functional mobility, minimize fall risk and decrease caregiver burden for planned discharge home with 24 hour supervision.  Anticipate patient will benefit from follow up OP at discharge.  PT - End of Session Activity Tolerance: Decreased this session Endurance Deficit: Yes PT Assessment Rehab Potential (ACUTE/IP ONLY): Good PT Patient demonstrates impairments in the following area(s): Balance;Edema;Endurance;Motor;Sensory;Skin Integrity PT Transfers Functional Problem(s): Bed Mobility;Bed to Chair;Car;Furniture;Floor PT Locomotion Functional Problem(s): Ambulation;Stairs PT Plan PT Intensity: Minimum of 1-2 x/day ,45 to 90 minutes PT Frequency: 5 out of 7 days PT Duration Estimated Length of Stay: 5-7 days PT Treatment/Interventions: Ambulation/gait  training;Balance/vestibular training;Community reintegration;Discharge planning;Disease management/prevention;DME/adaptive equipment instruction;Functional mobility training;Neuromuscular re-education;Pain management;Patient/family education;Psychosocial support;Skin care/wound management;Splinting/orthotics;Stair training;Therapeutic Activities;Therapeutic Exercise;UE/LE Strength taining/ROM;UE/LE Coordination activities;Wheelchair propulsion/positioning PT Transfers Anticipated Outcome(s): mod I overall PT Locomotion Anticipated Outcome(s): mod I overall PT Recommendation Recommendations for Other Services: Neuropsych consult;Therapeutic Recreation consult Therapeutic Recreation Interventions: Stress management;Outing/community reintergration Follow Up Recommendations: Outpatient PT Patient destination: Home (brother home in [place]) Equipment Recommended: To be determined Equipment Details: has no equipment but may need RW or SPC at d/c for independent   PT Evaluation Precautions/Restrictions Precautions Precautions: None Precaution Comments: edema in LLE Restrictions Weight Bearing Restrictions: No   Vital Signs Pulse Rate: 60 O2 = 100%  Pain Pain Assessment Pain Scale: 0-10 Pain Score: 0-No pain Home Living/Prior Functioning Home Living Available Help at Discharge: Family;Available 24 hours/day (brother ) Home Access: Level entry Home Layout: Able to live on main level with bedroom/bathroom (multi level but can stay on main level) Bathroom Accessibility: Yes  Lives With: Alone Prior Function Level of Independence: Independent with basic ADLs;Independent with gait;Independent with transfers;Independent with homemaking with ambulation  Able to Take Stairs?: Yes Driving: Yes 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) Vision/Perception  Perception Perception: Within Functional Limits Praxis Praxis: Intact  Cognition Overall Cognitive Status: Within Functional Limits for tasks  assessed Orientation Level: Oriented X4 Memory: Appears intact Safety/Judgment: Appears intact Sensation Sensation Light Touch: Impaired by gross assessment (reports some decreased sensation on LUE/LLE) Proprioception: Appears Intact Coordination Gross Motor Movements are Fluid and Coordinated: Yes Motor  Motor Motor: Abnormal postural alignment and control Motor - Skilled Clinical Observations: gneralized weakness (L > R); decreased balance  strategies   Trunk/Postural Assessment  Cervical Assessment Cervical Assessment: Within Functional Limits Thoracic Assessment Thoracic Assessment: Within Functional Limits Lumbar Assessment Lumbar Assessment: Within Functional Limits Postural Control Postural Control: Deficits on evaluation (decreased balance strategies)  Balance Balance Balance Assessed: Yes Static Sitting Balance Static Sitting - Level of Assistance: 6: Modified independent (Device/Increase time) Dynamic Sitting Balance Dynamic Sitting - Level of Assistance: 5: Stand by assistance Static Standing Balance Static Standing - Level of Assistance: 4: Min assist Dynamic Standing Balance Dynamic Standing - Level of Assistance: 4: Min assist Extremity Assessment      RLE Assessment RLE Assessment: Exceptions to [facility] General Strength Comments: grossly 3+ to 4-/5 LLE Assessment LLE Assessment: Exceptions to [facility] General Strength Comments: grossly 3+/5; limited DF due to edema (3-/5)  Care Tool Care Tool Bed Mobility Roll left and right activity   Roll left and right assist level: Supervision/Verbal cueing    Sit to lying activity   Sit to lying assist level: Supervision/Verbal cueing    Lying to sitting edge of bed activity   Lying to sitting edge of bed assist level: Supervision/Verbal cueing     Care Tool Transfers Sit to stand transfer   Sit to stand assist level: Minimal Assistance - Patient > 75%    Chair/bed transfer   Chair/bed transfer assist level:  Minimal Assistance - Patient > 75%     Physiological scientist transfer assist level: Minimal Assistance - Patient > 75%      Care Tool Locomotion Ambulation   Assist level: Minimal Assistance - Patient > 75% Assistive device: No Device Max distance: 200'  Walk 10 feet activity   Assist level: Minimal Assistance - Patient > 75% Assistive device: No Device   Walk 50 feet with 2 turns activity   Assist level: Minimal Assistance - Patient > 75% Assistive device: No Device  Walk 150 feet activity   Assist level: Minimal Assistance - Patient > 75% Assistive device: No Device  Walk 10 feet on uneven surfaces activity   Assist level: Minimal Assistance - Patient > 75%    Stairs   Assist level: Minimal Assistance - Patient > 75% Stairs assistive device: 2 hand rails Max number of stairs: 4  Walk up/down 1 step activity   Walk up/down 1 step (curb) assist level: Minimal Assistance - Patient > 75% Walk up/down 1 step or curb assistive device: 2 hand rails    Walk up/down 4 steps activity Walk up/down 4 steps assist level: Minimal Assistance - Patient > 75% Walk up/down 4 steps assistive device: 2 hand rails  Walk up/down 12 steps activity Walk up/down 12 steps activity did not occur: Safety/medical concerns (endurance)      Pick up small objects from floor   Pick up small object from the floor assist level: Minimal Assistance - Patient > 75%    Wheelchair Will patient use wheelchair at discharge?: No          Wheel 50 feet with 2 turns activity      Wheel 150 feet activity        Refer to Care Plan for [place] 1 PT Short Term Goal 1 (Week 1): = LTGS  Recommendations for other services: Neuropsych and Therapeutic Recreation  Stress management and Outing/community reintegration  Skilled Therapeutic Intervention Mobility Bed Mobility Bed Mobility: Rolling Right;Supine to Sit;Sit to Supine Rolling Right:  Supervision/verbal cueing Supine  to Sit: Supervision/Verbal cueing Sit to Supine: Supervision/Verbal cueing Transfers Transfers: Sit to Stand;Stand to Sit;Stand Pivot Transfers Sit to Stand: Minimal Assistance - Patient > 75% Stand to Sit: Minimal Assistance - Patient > 75% Stand Pivot Transfers: Minimal Assistance - Patient > 75%   Cues for technique and assist for functional balance.  Locomotion  Gait Gait Distance (Feet): 200 Feet Assistive device: None Gait Gait Pattern: Impaired Stairs / Additional Locomotion Stairs: Yes Stairs Assistance: Minimal Assistance - Patient > 75% Stair Management Technique: Two rails;Alternating pattern Number of Stairs: 4 Height of Stairs: 6 Ramp: Minimal Assistance - Patient >75% Wheelchair Mobility Wheelchair Mobility: No   Instructed in simulated car transfer with min assist overall for community access.  Pt's progressed with functional mobility and interventions to overall CGA by end of session and did not introduce AD to increase challenge for balance. Pt pleased with his overall progress and eager to d/c at most independent level possible. Education provided on rehab process and goals as well as conferences with interdisciplinary team.    Discharge Criteria: Patient will be discharged from PT if patient refuses treatment 3 consecutive times without medical reason, if treatment goals not met, if there is a change in medical status, if patient makes no progress towards goals or if patient is discharged from hospital.  The above assessment, treatment plan, treatment alternatives and goals were discussed and mutually agreed upon: by patient  Juanna Cao, PT, DPT, CBIS  07/06/2020, 12:48 PM

## 2020-07-06 NOTE — Plan of Care (Signed)
  Problem: RH Balance Goal: LTG Patient will maintain dynamic standing with ADLs (OT) Description: LTG:  Patient will maintain dynamic standing balance with assist during activities of daily living (OT)  Flowsheets (Taken 07/06/2020 1614) LTG: Pt will maintain dynamic standing balance during ADLs with: Independent with assistive device   Problem: Sit to Stand Goal: LTG:  Patient will perform sit to stand in prep for activites of daily living with assistance level (OT) Description: LTG:  Patient will perform sit to stand in prep for activites of daily living with assistance level (OT) Flowsheets (Taken 07/06/2020 1614) LTG: PT will perform sit to stand in prep for activites of daily living with assistance level: Independent with assistive device   Problem: RH Grooming Goal: LTG Patient will perform grooming w/assist,cues/equip (OT) Description: LTG: Patient will perform grooming with assist, with/without cues using equipment (OT) Flowsheets (Taken 07/06/2020 1614) LTG: Pt will perform grooming with assistance level of: Independent with assistive device    Problem: RH Bathing Goal: LTG Patient will bathe all body parts with assist levels (OT) Description: LTG: Patient will bathe all body parts with assist levels (OT) Flowsheets (Taken 07/06/2020 1614) LTG: Pt will perform bathing with assistance level/cueing: Set up assist    Problem: RH Dressing Goal: LTG Patient will perform upper body dressing (OT) Description: LTG Patient will perform upper body dressing with assist, with/without cues (OT). Flowsheets (Taken 07/06/2020 1614) LTG: Pt will perform upper body dressing with assistance level of: Independent with assistive device Goal: LTG Patient will perform lower body dressing w/assist (OT) Description: LTG: Patient will perform lower body dressing with assist, with/without cues in positioning using equipment (OT) Flowsheets (Taken 07/06/2020 1614) LTG: Pt will perform lower body dressing  with assistance level of: Independent with assistive device   Problem: RH Toileting Goal: LTG Patient will perform toileting task (3/3 steps) with assistance level (OT) Description: LTG: Patient will perform toileting task (3/3 steps) with assistance level (OT)  Flowsheets (Taken 07/06/2020 1614) LTG: Pt will perform toileting task (3/3 steps) with assistance level: Independent with assistive device   Problem: RH Toilet Transfers Goal: LTG Patient will perform toilet transfers w/assist (OT) Description: LTG: Patient will perform toilet transfers with assist, with/without cues using equipment (OT) Flowsheets (Taken 07/06/2020 1614) LTG: Pt will perform toilet transfers with assistance level of: Independent with assistive device   Problem: RH Tub/Shower Transfers Goal: LTG Patient will perform tub/shower transfers w/assist (OT) Description: LTG: Patient will perform tub/shower transfers with assist, with/without cues using equipment (OT) Flowsheets (Taken 07/06/2020 1614) LTG: Pt will perform tub/shower stall transfers with assistance level of: Set up assist

## 2020-07-06 NOTE — Evaluation (Signed)
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 Hypothyroidism   . Prostate CA [facility])    Past Surgical History:  Past Surgical History:  Procedure Laterality Date  . IR THORACENTESIS ASP PLEURAL SPACE W/IMG GUIDE  06/27/2020  . IR THORACENTESIS ASP PLEURAL SPACE W/IMG GUIDE  06/28/2020  . LAMINECTOMY Bilateral 02/20/2019   Procedure: CERVICAL LAMINECTOMY FOR TUMOR;  Surgeon: Earnie Larsson, MD;  Location: [facility];  Service: Neurosurgery;  Laterality: Bilateral;  CERVICAL LAMINECTOMY FOR TUMOR  . LIGAMENT REPAIR Left     Assessment / Plan / Recommendation Clinical Impression Chris Morales is a 64 year old right-handed male with history of prostate cancer diagnosed June 2020 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 Patient with complicated course he was recently hospitalized at [facility] from 10/17-10/25 for shortness of breath and atrial fibrillation he did require intubation for a short time. Attempts were made at chemical cardioversion that failed and was placed on Eliquis. Reportedly after patient was extubated he was having some left  side weakness particularly left upper extremity. He was frustrated as to the delay in getting an MRI of his brain decided to leave AMA early morning of 06/24/2020 and return to the [place] area to be closer to his brother who is a retired Software engineer and patient immediately after landing in the [place] area was brought to [facility] for ongoing evaluation. In the ED he was found to still be in rapid atrial fibrillation. Chest x-ray showed substantial bilateral pleural effusions with evidence of diffuse metastatic lesions in the bones. CT angiogram of the chest negative for pulmonary emboli. He had a markedly elevated D-dimer of 19.33. MRI of the brain showed no acute infarction however there was some thickening enhancement along the posterior falx cerebri with possible minimal associated parenchymal edema. Attempts were made at MR C-spine but patient was unable to tolerate. Admission chemistries glucose 153, calcium 7.5, alkaline phosphatase 2755, BNP 281, urine culture no growth, digoxin level 1.0, troponin negative, hemoglobin 9.0, WBC 8.4. Patient currently remains on Lanoxin as well as Cardizem 30 mg every 8 hours with Eliquis ongoing as well as Toprol-XL 100 mg twice daily. Echocardiogram with ejection fraction of 50 to 55% no wall motion abnormalities. Patient did undergo IR ultrasound-guided right thoracentesis on 06/27/2020 with 1.6 L dark yellow fluid removed and left thoracentesis 06/28/2020 with 1.3 L bright yellow fluid removed and cultures continue to show no growth. Latest chest x-ray 07/01/2020 showed no significant recurrence of left pleural fluid since thoracentesis stable bibasilar atelectasis. Oncology services Dr. Jana Morales have been consulted in regards to prostate cancer and remains on Casodex and current plans to follow-up outpatient with Dr. Alen Morales as well as palliative care established with  plan to repeat MRI of the brain in 2 months per Dr. Mickeal Morales. A Foley catheter  tube currently remains in place for urinary retention awaiting plan for voiding trial. Neurology consult July 05, 2020 for some left upper extremity weakness with MRI cervical spine showing no evidence of recurrent epidural tumor butdidre demonstratesome diffuse osseous metastatic disease throughout the cervical spine. Multilevel neural foraminal narrowing greatest on the left at C3-4 moderate to severe and C4- 5 moderate.  Pt presents with mild memory impairments. Pt demonstrated moderate memory deficits on formal higher level cognitive linguistic assessment CLQT, however demonstrated functional recall of hospital course of stay, today's events and carryover of recall strategies taught by SLP. Pt demonstrated WFL in executive function, visual spatial, language and attention skills on CLQT. Pt demonstrated alternating attention and anticipatory awareness skills in conversation. Pt supports no cognitive changes and was able to increase short term recall skills over 3 trials of recalling details from paragraphs following instruction of general recall strategies. SLP provided handout of recall strategies and communicated with PT/OT to carryover recall strategies. Pt demonstrated speech skills WFL in conversation and no impairments noted in swallow per chart review. SLP does not recommend skill ST services at this time due to pt's improved carryover of skills, other therapeutic disciplines targeting recall within functional task and pt's desire to be excluded from [place] services.    Skilled Therapeutic Interventions          SLP facilitated administration of cognitive linguistic formal assessment and provided education of results and recall strategies. All questions answered to satisfaction.  Pt was left in room with call bell within reach and chair alarm set.    SLP Assessment  Patient does not need any further Speech [name] Pathology Services    Recommendations  Patient destination: Home Follow up  Recommendations: None Equipment Recommended: None recommended by SLP    SLP Frequency  (N/A)   SLP Duration  SLP Intensity  SLP Treatment/Interventions N/A   (N/A)    (N/A)   Pain Pain Assessment Pain Score: 0-No pain  Prior Functioning Cognitive/Linguistic Baseline: Information not available Type of Home: House  Lives With: Alone Available Help at Discharge: Family;Available 24 hours/day Vocation: Full time employment  SLP Evaluation Cognition Overall Cognitive Status: Impaired/Different from baseline Arousal/Alertness: Awake/alert Orientation Level: Oriented X4 Attention: Alternating Alternating Attention: Appears intact Memory: Impaired Memory Impairment: Decreased recall of new information Immediate Memory Recall: Sock;Blue;Bed Memory Recall Sock: Without Cue Memory Recall Blue: Without Cue Memory Recall Bed: Without Cue Awareness: Appears intact Problem Solving: Appears intact Safety/Judgment: Appears intact  Comprehension Auditory Comprehension Overall Auditory Comprehension: Appears within functional limits for tasks assessed Expression Expression Primary Mode of Expression: Verbal Verbal Expression Overall Verbal Expression: Appears within functional limits for tasks assessed Written Expression Dominant Hand: Right Oral Motor Oral Motor/Sensory Function Overall Oral Motor/Sensory Function: Within functional limits Motor Speech Overall Motor Speech: Appears within functional limits for tasks assessed  Care Tool Care Tool Cognition Expression of Ideas and Wants Expression of Ideas and Wants: Without difficulty (complex and basic) - expresses complex messages without difficulty and with speech that is clear and easy to understand   Understanding Verbal and Non-Verbal Content Understanding Verbal and Non-Verbal Content: Understands (complex and basic) - clear comprehension without cues or repetitions   Memory/Recall Ability *first 3 days only       Short Term Goals: No short term goals set  Refer to Care Plan for Long Term Goals  Recommendations for other services: None  Discharge Criteria: Patient will be discharged from SLP if patient refuses treatment 3 consecutive times without medical reason, if treatment goals not met, if there is a change in medical status, if patient makes no progress towards goals or if patient is discharged from hospital.  The above assessment, treatment plan, treatment alternatives and goals were discussed and mutually agreed upon: by patient  Tywan Siever  [facility] 07/06/2020, 12:50 PM

## 2020-07-07 NOTE — Progress Notes (Signed)
[facility] PHYSICAL MEDICINE & REHABILITATION PROGRESS NOTE   Subjective/Complaints: Mr. Worley has no complaints this morning. He does not like the call bell but understands that he is not supposed to get up without staff assistance at this time. Discussed results of cervical MRI with him and plan of care.   ROS: Denies pain, constipation   Objective:   No results found. No results for input(s): WBC, HGB, HCT, PLT in the last 72 hours. No results for input(s): NA, K, CL, CO2, GLUCOSE, BUN, CREATININE, CALCIUM in the last 72 hours.  Intake/Output Summary (Last 24 hours) at 07/07/2020 1223 Last data filed at 07/07/2020 0518 Gross per 24 hour  Intake 120 ml  Output 450 ml  Net -330 ml        Physical Exam: Vital Signs Blood pressure 111/73, pulse 62, temperature 98.5 F (36.9 C), resp. rate 18, height 6\' 1"  (1.854 m), weight 72.1 kg, SpO2 100 %. General: Alert and oriented x 3, No apparent distress HEENT: Head is normocephalic, atraumatic, PERRLA, EOMI, sclera anicteric, oral mucosa pink and moist, dentition intact, ext ear canals clear,  Neck: Supple without JVD or lymphadenopathy Heart: Reg rate and rhythm. No murmurs rubs or gallops Chest: CTA bilaterally without wheezes, rales, or rhonchi; no distress Abdomen: Soft, non-tender, non-distended, bowel sounds positive. Extremities: No clubbing, cyanosis, or edema. Pulses are 2+  Skin: Clean and intact without signs of breakdown Neuro: Pt is cognitively appropriate with normal insight, memory, and awareness. Cranial nerves 2-12 are intact. Sensory exam is normal. Reflexes are 2+ in all 4's. Fine motor coordination is intact. No tremors. Motor function is grossly 4/5 with exception of 3/5 left hand grip and 4-/5 left elbow extension (much improved) Musculoskeletal: Full ROM, No pain with AROM or PROM in the neck, trunk, or extremities. Posture appropriate Psych: Pt's affect is appropriate. Pt is  cooperative    Assessment/Plan: 1. Functional deficits secondary to Afib with RVR which require 3+ hours per day of interdisciplinary therapy in a comprehensive inpatient rehab setting.  Physiatrist is providing close team supervision and 24 hour management of active medical problems listed below.  Physiatrist and rehab team continue to assess barriers to discharge/monitor patient progress toward functional and medical goals  Care Tool:  Bathing    Body parts bathed by patient: Right arm, Left arm, Chest, Abdomen, Front perineal area, Buttocks, Right upper leg, Left upper leg, Left lower leg, Face, Right lower leg         Bathing assist Assist Level: Contact Guard/Touching assist     Upper Body Dressing/Undressing Upper body dressing   What is the patient wearing?: Pull over shirt    Upper body assist Assist Level: Minimal Assistance - Patient > 75%    Lower Body Dressing/Undressing Lower body dressing      What is the patient wearing?: Pants     Lower body assist Assist for lower body dressing: Moderate Assistance - Patient 50 - 74%     Toileting Toileting    Toileting assist Assist for toileting: Supervision/Verbal cueing     Transfers Chair/bed transfer  Transfers assist     Chair/bed transfer assist level: Minimal Assistance - Patient > 75%     Locomotion Ambulation   Ambulation assist      Assist level: Minimal Assistance - Patient > 75% Assistive device: No Device Max distance: 200'   Walk 10 feet activity   Assist     Assist level: Minimal Assistance - Patient > 75% Assistive device:  No Device   Walk 50 feet activity   Assist    Assist level: Minimal Assistance - Patient > 75% Assistive device: No Device    Walk 150 feet activity   Assist    Assist level: Minimal Assistance - Patient > 75% Assistive device: No Device    Walk 10 feet on uneven surface  activity   Assist     Assist level: Minimal Assistance -  Patient > 75%     Wheelchair     Assist Will patient use wheelchair at discharge?: No             Wheelchair 50 feet with 2 turns activity    Assist            Wheelchair 150 feet activity     Assist          Blood pressure 111/73, pulse 62, temperature 98.5 F (36.9 C), resp. rate 18, height 6\' 1"  (1.854 m), weight 72.1 kg, SpO2 100 %.    Medical Problem List and Plan: 1.  Decreased functional mobility secondary to acute respiratory failure decompensated diastolic congestive heart failure bilateral pleural effusions status post thoracentesis             -patient may shower             -ELOS/Goals: modI 5-7 days  Continue CIR 2.  Antithrombotics: -DVT/anticoagulation: Eliquis mg BID             -antiplatelet therapy: N/A 3. Pain Management: Tylenol as needed. Well controlled 4. Mood: Provide emotional support             -antipsychotic agents: N/A 5. Neuropsych: This patient is capable of making decisions on his own behalf. Patient refuses chair belt but states that he will not get out of chair on his own and will call for help when needed. Chair belt makes him claustrophobic. Bed alarm in place 6. Skin/Wound Care: Routine skin checks 7. Fluids/Electrolytes/Nutrition: Routine in and outs with follow-up chemistries 8.  Prostate cancer with bony metastasis.  Plans to follow-up outpatient Dr. [name] as well as Dr. Mickeal Skinner and plan to repeat MRI of the brain 2 months.  Continue Casodex 50 mg daily 9.  Atrial fibrillation with RVR.  Lanoxin 0.25 mg daily, Cardizem 30 mg every 8 hours, Toprol-XL 100 mg twice daily. HR is well controlled 10. Acute on chronic anemia. Latest hemoglobin 8.4. Follow-up CBC on Monday. 11.  Hypothyroidism.  Synthroid 11mcg daily.  Latest TSH 7.345.  Synthroid was recently initiated while in [place] earlier this month.  Plan to repeat TFTs 4 to 6 weeks. 12.  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  Foley catheter tube  placed in [place].DO NOT REMOVE FOLEY TUBE FOR NOW 13. S/p cervical laminectomy with tumor removal in June 2020. Repeat MRI cervical spine 11/5 was ordered for left upper extremity weakness and did not show recurrent epidural tumor; it does show diffuse osseous metastatic disease throughout the cervical spine with multilevle neural foraminal narrowing greateres on the left at C3-C4 > C4-C5: monitor for pain, left sided weakness, neurological deficits. Discussed results with patient.   LOS: 2 days A FACE TO FACE EVALUATION WAS PERFORMED  Clide Deutscher Christien Berthelot 07/07/2020, 12:23 PM

## 2020-07-07 NOTE — Progress Notes (Signed)
Patient educated at length by multiple staff members; about need to call staff for mobility and transfers to toilet, related to safety and unit policy. Patient repeatedly found to be non-compliant with safety precautions, by standing up alone in the room and observed to be transferring between the bed and chair alone, as well as ambulating to Bathroom independently. Patient has foley catheter that is tethering patient to chair and safety risk is noted in chart. PT, OT, and SLP evaluations noted for balance, postural control, endurance and problem solving deficits. Charge nurse Roselyn Reef notified of situation, and oncoming nurse will be notified of safety risk. Bed alarm in use. Bed in lowest position with call bell in reach.

## 2020-07-07 NOTE — Progress Notes (Signed)
   07/07/20 1500  Clinical Encounter Type  Visited With Patient  Visit Type Other (Comment)  Referral From Nurse  Consult/Referral To Chaplain  Spiritual Encounters  Spiritual Needs Other (Comment)  Stress Factors  Patient Stress Factors None identified  Family Stress Factors None identified  Patient was on Chaplain's consult list for AD info. Chaplain took AD to patient's room and he already has one. He has not looked at it but will have nurse call Chaplain when he is ready to sign.

## 2020-07-08 ENCOUNTER — Inpatient Hospital Stay (HOSPITAL_COMMUNITY): Payer: 59 | Admitting: Occupational Therapy

## 2020-07-08 ENCOUNTER — Inpatient Hospital Stay (HOSPITAL_COMMUNITY): Payer: 59

## 2020-07-08 ENCOUNTER — Inpatient Hospital Stay (HOSPITAL_COMMUNITY): Payer: PRIVATE HEALTH INSURANCE

## 2020-07-08 DIAGNOSIS — G959 Disease of spinal cord, unspecified: Secondary | ICD-10-CM

## 2020-07-08 DIAGNOSIS — R5381 Other malaise: Principal | ICD-10-CM

## 2020-07-08 LAB — CBC WITH DIFFERENTIAL/PLATELET
Abs Immature Granulocytes: 0.08 10*3/uL — ABNORMAL HIGH (ref 0.00–0.07)
Basophils Absolute: 0.1 10*3/uL (ref 0.0–0.1)
Basophils Relative: 1 %
Eosinophils Absolute: 0.1 10*3/uL (ref 0.0–0.5)
Eosinophils Relative: 2 %
HCT: 31.8 % — ABNORMAL LOW (ref 39.0–52.0)
Hemoglobin: 9.7 g/dL — ABNORMAL LOW (ref 13.0–17.0)
Immature Granulocytes: 2 %
Lymphocytes Relative: 24 %
Lymphs Abs: 1.3 10*3/uL (ref 0.7–4.0)
MCH: 28.8 pg (ref 26.0–34.0)
MCHC: 30.5 g/dL (ref 30.0–36.0)
MCV: 94.4 fL (ref 80.0–100.0)
Monocytes Absolute: 0.4 10*3/uL (ref 0.1–1.0)
Monocytes Relative: 7 %
Neutro Abs: 3.4 10*3/uL (ref 1.7–7.7)
Neutrophils Relative %: 64 %
Platelets: 326 10*3/uL (ref 150–400)
RBC: 3.37 MIL/uL — ABNORMAL LOW (ref 4.22–5.81)
RDW: 22.5 % — ABNORMAL HIGH (ref 11.5–15.5)
WBC: 5.3 10*3/uL (ref 4.0–10.5)
nRBC: 6.6 % — ABNORMAL HIGH (ref 0.0–0.2)

## 2020-07-08 LAB — COMPREHENSIVE METABOLIC PANEL
ALT: 21 U/L (ref 0–44)
AST: 18 U/L (ref 15–41)
Albumin: 2.6 g/dL — ABNORMAL LOW (ref 3.5–5.0)
Alkaline Phosphatase: 2559 U/L — ABNORMAL HIGH (ref 38–126)
Anion gap: 8 (ref 5–15)
BUN: 16 mg/dL (ref 8–23)
CO2: 23 mmol/L (ref 22–32)
Calcium: 8.1 mg/dL — ABNORMAL LOW (ref 8.9–10.3)
Chloride: 108 mmol/L (ref 98–111)
Creatinine, Ser: 0.75 mg/dL (ref 0.61–1.24)
GFR, Estimated: >60 mL/min (ref >60)
Glucose, Bld: 133 mg/dL — ABNORMAL HIGH (ref 70–99)
Potassium: 4.3 mmol/L (ref 3.5–5.1)
Sodium: 139 mmol/L (ref 135–145)
Total Bilirubin: 0.8 mg/dL (ref 0.3–1.2)
Total Protein: 5.8 g/dL — ABNORMAL LOW (ref 6.5–8.1)

## 2020-07-08 NOTE — IPOC Note (Signed)
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[place])   Euthyroid sick syndrome   Left-sided weakness   Pleural effusion, bilateral   Debility     Functional Problem List: Nursing Bladder, Bowel, Edema, Endurance, Sensory  PT Balance, Edema, Endurance, Motor, Sensory, Skin Integrity  OT Balance, Safety, Edema, Endurance, Motor  SLP    TR         Basic ADL's: OT Grooming, Bathing, Dressing, Toileting     Advanced  ADL's: OT Simple Meal Preparation, Light Housekeeping     Transfers: PT Bed Mobility, Bed to Chair, Car, Furniture, Floor  OT Toilet, Tub/Shower     Locomotion: PT Ambulation, Stairs     Additional Impairments: OT Fuctional Use of Upper Extremity  SLP        TR      Anticipated Outcomes Item Anticipated Outcome  Self Feeding No goal  Swallowing      Basic self-care  Supervision/setup-Mod I  Toileting  Mod I   Bathroom Transfers Supervision/setup-Mod I  Bowel/Bladder  to be continent of bowel  Transfers  mod I overall  Locomotion  mod I overall  Communication     Cognition     Pain  less than 3 out of 10  Safety/Judgment  (P) No reports of falls or injuries while on IPR   Therapy Plan: PT Intensity: Minimum of 1-2 x/day ,45 to 90 minutes PT Frequency: 5 out of 7 days PT Duration Estimated Length of Stay: 5-7 days OT Intensity: Minimum of 1-2 x/day, 45 to 90 minutes OT Frequency: 5 out of 7 days OT Duration/Estimated Length of Stay: 7-10 days SLP Intensity:  (N/A) SLP Frequency:  (N/A) SLP Duration/Estimated Length of  Stay: N/A   Due to the current state of emergency, patients may not be receiving their 3-hours of Medicare-mandated therapy.   Team Interventions: Nursing Interventions Patient/Family Education, Bladder Management, Bowel Management, Disease Management/Prevention, Discharge Planning, Pain Management, Psychosocial Support, Medication Management  PT interventions Ambulation/gait training, Training and development officer, Community reintegration, Discharge planning, Disease management/prevention, DME/adaptive equipment instruction, Functional mobility training, Neuromuscular re-education, Pain management, Patient/family education, Psychosocial support, Skin care/wound management, Splinting/orthotics, Stair training, Therapeutic Activities, Therapeutic Exercise, UE/LE Strength taining/ROM, UE/LE Coordination activities, Wheelchair propulsion/positioning  OT Interventions Balance/vestibular training, Discharge planning, Pain management, Therapeutic Activities, UE/LE Coordination activities, Self Care/advanced ADL retraining, Cognitive remediation/compensation, Disease mangement/prevention, Functional mobility training, Patient/family education, Therapeutic Exercise, UE/LE Strength taining/ROM, Psychosocial support, Neuromuscular re-education, DME/adaptive equipment instruction, Community reintegration  SLP Interventions    TR Interventions    SW/CM Interventions     Barriers to Discharge MD  Medical stability and Neurogenic bowel and bladder  Nursing Incontinence, Pending chemo/radiation, Other (comments) (Has Prostatic CA with mets)    PT      OT Home environment access/layout, Other (comments) (8 STE + foley care)    SLP      SW       Team Discharge Planning: Destination: PT-Home (brother home in [place]) ,OT- Home , SLP-Home Projected Follow-up: PT-Outpatient PT, OT-  Home health OT, SLP-None Projected Equipment Needs: PT-To be determined, OT- To be determined, SLP-None recommended by  SLP Equipment  Details: PT-has no equipment but may need RW or SPC at d/c for independent, OT-  Patient/family involved in discharge planning: PT- Patient,  OT-Patient, SLP-Patient  MD ELOS: 5-7 d Medical Rehab Prognosis:  Good Assessment:  64 year old right-handed male with history of prostate cancer diagnosed June 2020 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  He does have family in the [place] area who assist as needed.  Patient with complicated course he was recently hospitalized at [facility] in [place] from 10/17-10/25 for shortness of breath and atrial fibrillation he did require intubation for a short time.  Attempts were made at chemical cardioversion that failed and was placed on Eliquis.  Reportedly after patient was extubated he was having some left side weakness particularly left upper extremity.  He was frustrated as to the delay in getting an MRI of his brain decided to leave AMA early morning of 06/24/2020 and return to the [place] area to be closer to his brother who is a retired Software engineer and patient immediately after landing in the [place] area was brought to [facility] for ongoing evaluation.  In the ED he was found to still be in rapid atrial fibrillation.  Chest x-ray showed substantial bilateral pleural effusions with evidence of diffuse metastatic lesions in the bones.  CT angiogram of the chest negative for pulmonary emboli.  He had a markedly elevated D-dimer of 19.33.  MRI of the brain showed no acute infarction however there was some thickening enhancement along the posterior falx cerebri with possible minimal associated parenchymal edema.  Attempts were made at MR C-spine but patient was unable to tolerate.   Admission chemistries glucose 153, calcium 7.5, alkaline phosphatase 2755, BNP 281, urine culture no growth, digoxin level 1.0, troponin negative, hemoglobin 9.0, WBC 8.4.  Patient currently remains on Lanoxin as well as Cardizem 30 mg every 8 hours with Eliquis ongoing as well as Toprol-XL 100 mg twice daily.  Echocardiogram with ejection fraction of 50 to 55% no wall motion abnormalities.  Patient did undergo IR ultrasound-guided right thoracentesis on 06/27/2020 with 1.6 L dark yellow fluid removed and left thoracentesis 06/28/2020 with 1.3 L bright yellow fluid removed and cultures continue to show no growth.  Latest chest x-ray 07/01/2020 showed no significant recurrence of left pleural fluid since thoracentesis stable bibasilar atelectasis.  Oncology services Dr. Jana Hakim have been consulted in regards to prostate cancer and remains on Casodex and current plans to follow-up outpatient with Dr. [name] as well as palliative care established with plan to repeat MRI of the brain in 2 months per Dr. Mickeal Skinner.  A Foley catheter tube currently remains in place for urinary retention awaiting plan for voiding trial.  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  Multilevel neural foraminal narrowing greatest on the left at C3-4 moderate to severe and C4-5 moderate.  Therapy evaluations completed and patient was admitted for a comprehensive rehab program.    Now requiring 24/7 Rehab RN,MD, as well as CIR level PT, OT .  Treatment team will focus on ADLs and mobility with goals set at sup/Mod  I  See Team Conference Notes for weekly updates to the plan of care

## 2020-07-08 NOTE — Progress Notes (Addendum)
[facility] Individual Statement of Services  Patient Name:  Chris Morales  Date:  07/08/2020  Welcome to the [facility].  Our goal is to provide you with an individualized program based on your diagnosis and situation, designed to meet your specific needs.  With this comprehensive rehabilitation program, you will be expected to participate in at least 3 hours of rehabilitation therapies Monday-Friday, with modified therapy programming on the weekends.  Your rehabilitation program will include the following services:  Physical Therapy (PT), Occupational Therapy (OT),SP evaluation, 24 hour per day rehabilitation nursing, Neuropsychology, Care Coordinator, Rehabilitation Medicine, Nutrition Services and Pharmacy Services  Weekly team conferences will be held on Wednesday to discuss your progress.  Your Inpatient Rehabilitation Care Coordinator will talk with you frequently to get your input and to update you on team discussions.  Team conferences with you and your family in attendance may also be held.  Expected length of stay:7-9 days  Overall anticipated outcome: independent with device  Depending on your progress and recovery, your program may change. Your Inpatient Rehabilitation Care Coordinator will coordinate services and will keep you informed of any changes. Your Inpatient Rehabilitation Care Coordinator's name and contact numbers are listed  below.  The following services may also be recommended but are not provided by the [facility] will be made to provide these services after discharge if needed.  Arrangements include referral to agencies that provide these services.  Your insurance has been verified to be:  Chris Morales Your primary doctor is:  Oncologist  Pertinent  information will be shared with your doctor and your insurance company.  Inpatient Rehabilitation Care Coordinator:  Ovidio Kin, [place] or Emilia Beck  Information discussed with and copy given to patient by: Elease Hashimoto, 07/08/2020, 10:23 AM

## 2020-07-08 NOTE — Progress Notes (Signed)
Occupational Therapy Session Note  Patient Details  Name: Chris Morales MRN: [id number] Date of Birth: 06-Jan-1956  Today's Date: 07/08/2020 OT Individual Time: 9323-5573 OT Individual Time Calculation (min): 40 min  and Today's Date: 07/08/2020 OT Missed Time: 20 Minutes Missed Time Reason: Other (comment) (patient eating)   Short Term Goals: Week 1:  OT Short Term Goal 1 (Week 1): STGs=LTGs due to ELOS  Skilled Therapeutic Interventions/Progress Updates:    Pt received sitting in recliner with no c/o pain. Pt waiting for condiments to be brought up from dietary to eat his breakfast. First 20 min missed as pt preferred to finish his breakfast before starting OT session. Discussed R RTC injury (?) pt with very limited R shoulder activation/movement. Pt reports injury began with sudden paresthesia and loss of movement at the shoulder after sleeping. Encouraged pt to follow up with appropriate MD. Pt completed sit > stand from recliner with supervision. He took several steps before requiring a minute rest break in standing to "get himself together" before finishing transfer into bathroom with CGA, progressing to supervision. Pt completed toilet transfer with supervision. He voided BM and requested assistance with wiping, however with encouragement was able to do this himself without assistance. Pt transferred into shower with min A to doff socks while seated. Pt completed all bathing with supervision. Pt required cueing for safety, transferring out of the shower while soaking wet. Pt donned gown with min A. Pt transferred back to recliner and thigh high teds were donned for edema control with total A. Pt was left sitting up with all needs met, chair alarm set.   Therapy Documentation Precautions:  Precautions Precautions: None Precaution Comments: edema in LLE, RTC injury in the Rt shoulder Restrictions Weight Bearing Restrictions: No  Therapy/Group: Individual Therapy  Curtis Sites 07/08/2020, 6:53 AM

## 2020-07-08 NOTE — Progress Notes (Signed)
Physical Therapy Session Note  Patient Details  Name: Chris Morales MRN: [id number] Date of Birth: [birth year]  Today's Date: 07/08/2020 PT Individual Time: 1015-1112 PT Individual Time Calculation (min): 57 min   Short Term Goals: Week 1:  PT Short Term Goal 1 (Week 1): = LTGS  Skilled Therapeutic Interventions/Progress Updates:   Received pt sitting in recliner, pt agreeable to therapy, and denied any pain during session. Session with emphasis on functional mobility/transfers, generalized strengthening, dynamic standing balance/coordination, ambulation, and improved activity tolerance. Donned second gown with min A and pt transferred sit<>stand without AD and supervision and began ambulating. Pt suddenly reported increased dizziness and returned to recliner until symptoms resolved. Pt stated "that happens sometimes when I get up too fast." Pt demonstrated good awareness knowing when to sit and rest. Pt ambulated 156ft x 2 trials to/from therapy gym without AD and CGA. Pt demonstrates decreased cadence, narrow BOS, decreased stride length, decreased trunk rotation and arm swing, and decreased bilateral foot clearance. Pt performed the following exercises with verbal cues for technique and CGA for standing balance: -Standing bicep curls 2x10 bilaterally with 5lb weight on LUE and un-weighted on R UE due to reports of a rotator cuff tear. Pt with poor grip strength dropping dumbbell in L hand -Seated single arm overhead chest press 2x10 bilaterally un-weighted on L UE; unable to perform on R UE due to weakness from rotator cuff tear -standing alternating marching 2x15  -mini-squats 2x10 Pt required multiple rest breaks throughout session due to increased fatigue and c/o SOB. O2 sat 96% throughout session. Pt ambulated additional 151ft without AD and CGA to dayroom and performed BLE strengthening on Nustep at workload 1 for 5 minutes for a total of 172 steps for improved cardiovascular endurance.  Concluded session with pt sitting in recliner, needs within reach, and chair pad alarm on.   Therapy Documentation Precautions:  Precautions Precautions: None Precaution Comments: edema in LLE, RTC injury in the Rt shoulder Restrictions Weight Bearing Restrictions: No  Therapy/Group: Individual Therapy Alfonse Alpers PT, DPT   07/08/2020, 7:31 AM

## 2020-07-08 NOTE — Progress Notes (Signed)
Inpatient Rehabilitation  Patient information reviewed and entered into eRehab system by Burnett Spray M. Roselee Tayloe, M.A., CCC/SLP, PPS Coordinator.  Information including medical coding, functional ability and quality indicators will be reviewed and updated through discharge.    

## 2020-07-08 NOTE — Progress Notes (Signed)
Occupational Therapy Session Note  Patient Details  Name: Chris Morales MRN: [id number] Date of Birth: 29-Dec-1955  Today's Date: 07/08/2020 OT Individual Time: 1157-2620 OT Individual Time Calculation (min): 70 min    Short Term Goals: Week 1:  OT Short Term Goal 1 (Week 1): STGs=LTGs due to ELOS  Skilled Therapeutic Interventions/Progress Updates:    Treatment session with focus on functional mobility, endurance, and BUE strengthening and ROM.  Pt received upright in recliner reporting desire to focus on BUE strengthening and ROM.  Pt ambulated 150' to therapy gym without AD with CGA.  Pt with one instance of LOB, however able to correct with sidestepping and min assist.  Engaged in further assessment of RUE weakness. Pt with trace shoulder movement but good movements distally and good grip strength.  Engaged in [place] and PROM to shoulder with improved movements with support at elbow to approximate joint, but no active movement without support.  Utilized UE Ranger in sitting with shoulder flexion to 30-40*  Engaged in shoulder abduction with support at elbow, but unable to complete without support.  Engaged in wall slides and wall pushups with LUE only.  Pt required frequent rest breaks due to decreased endurance.  Pt able to tolerate 1-2 mins of standing activity/exercise before requiring seated rest break.  Engaged in [place] with 2 sets of 12 bicep curls, elbow extension, single hand chest press, and supination/pronation with 3# dumbbell.  Attempted use of dowel with BUE to facilitate use of LUE to increase AAROM of RUE.  Pt unable to complete without support at Rt elbow.  Pt ambulated back to room and transferred back to recliner with CGA.  Pt remained upright with chair alarm on and all needs in reach.  Therapy Documentation Precautions:  Precautions Precautions: None Precaution Comments: edema in LLE, RTC injury in the Rt shoulder Restrictions Weight Bearing Restrictions:  No General:   Vital Signs: Therapy Vitals Temp: 98.6 F (37 C) Temp Source: Oral Pulse Rate: 67 Resp: 18 BP: 123/67 Patient Position (if appropriate): Sitting Oxygen Therapy SpO2: 100 % O2 Device: Room Air Pain:  Pt with no c/o pain   Therapy/Group: Individual Therapy  [name] 07/08/2020, 3:14 PM

## 2020-07-08 NOTE — Progress Notes (Signed)
Patient alert and oriented x4, denies pain or discomfort, up in recliner most of shift and night,states his bed is very uncomfortable. No acute distress, call bed and chair alarm in place.Continue regime.

## 2020-07-08 NOTE — Progress Notes (Signed)
[facility] PHYSICAL MEDICINE & REHABILITATION PROGRESS NOTE   Subjective/Complaints: C/o bed alarm interfering with sleep .   No other issues, no pain LLE>RLE weakness Patient gives history of falling asleep in the car with his hands behind his head and unable to move the arm afterwards.  Denies any numbness in the shoulder.  Reviewed cervical MRI He reports his therapist and he discussed a shoulder MRI. ROS: Denies pain, constipation   Objective:   No results found. Recent Labs    07/08/20 0757  WBC 5.3  HGB 9.7*  HCT 31.8*  PLT 326   Recent Labs    07/08/20 0757  NA 139  K 4.3  CL 108  CO2 23  GLUCOSE 133*  BUN 16  CREATININE 0.75  CALCIUM 8.1*    Intake/Output Summary (Last 24 hours) at 07/08/2020 1024 Last data filed at 07/08/2020 0511 Gross per 24 hour  Intake 660 ml  Output 1075 ml  Net -415 ml        Physical Exam: Vital Signs Blood pressure 124/83, pulse 60, temperature 98.6 F (37 C), temperature source Oral, resp. rate 18, height 6\' 1"  (1.854 m), weight 72.1 kg, SpO2 100 %.  General: No acute distress Mood and affect are appropriate Heart: Regular rate and rhythm no rubs murmurs or extra sounds Lungs: Clear to auscultation, breathing unlabored, no rales or wheezes Abdomen: Positive bowel sounds, soft nontender to palpation, nondistended Extremities: No clubbing, cyanosis, or edema Skin: No evidence of breakdown, no evidence of rash  Neuro: Pt is cognitively appropriate with normal insight, memory, and awareness. Cranial nerves 2-12 are intact. Sensory exam is normal. No tremors.  Lower extremity motor 4 - left hip flexor and knee extensor 4+ right hip flexor knee extensor Right upper extremity trace shoulder abduction 4/5 right elbow flexors, 4/5 finger flexors, left upper extremity 3 - shoulder abductors, 4/5 elbow flexors, 3/5 finger flexors Musculoskeletal: Full ROM, No pain with AROM or PROM in the neck, trunk, or extremities. Posture  appropriate Psych: Pt's affect is appropriate. Pt is cooperative    Assessment/Plan: 1. Functional deficits secondary to debility after CHF exacerbation which require 3+ hours per day of interdisciplinary therapy in a comprehensive inpatient rehab setting.  Physiatrist is providing close team supervision and 24 hour management of active medical problems listed below.  Physiatrist and rehab team continue to assess barriers to discharge/monitor patient progress toward functional and medical goals  Care Tool:  Bathing    Body parts bathed by patient: Right arm, Left arm, Chest, Abdomen, Front perineal area, Buttocks, Right upper leg, Left upper leg, Left lower leg, Face, Right lower leg         Bathing assist Assist Level: Supervision/Verbal cueing     Upper Body Dressing/Undressing Upper body dressing   What is the patient wearing?: Hospital gown only    Upper body assist Assist Level: Minimal Assistance - Patient > 75%    Lower Body Dressing/Undressing Lower body dressing      What is the patient wearing?: Pants     Lower body assist Assist for lower body dressing: Moderate Assistance - Patient 50 - 74%     Toileting Toileting    Toileting assist Assist for toileting: Supervision/Verbal cueing     Transfers Chair/bed transfer  Transfers assist     Chair/bed transfer assist level: Contact Guard/Touching assist     Locomotion Ambulation   Ambulation assist      Assist level: Minimal Assistance - Patient > 75% Assistive  device: No Device Max distance: 200'   Walk 10 feet activity   Assist     Assist level: Minimal Assistance - Patient > 75% Assistive device: No Device   Walk 50 feet activity   Assist    Assist level: Minimal Assistance - Patient > 75% Assistive device: No Device    Walk 150 feet activity   Assist    Assist level: Minimal Assistance - Patient > 75% Assistive device: No Device    Walk 10 feet on uneven surface   activity   Assist     Assist level: Minimal Assistance - Patient > 75%     Wheelchair     Assist Will patient use wheelchair at discharge?: No             Wheelchair 50 feet with 2 turns activity    Assist            Wheelchair 150 feet activity     Assist          Blood pressure 124/83, pulse 60, temperature 98.6 F (37 C), temperature source Oral, resp. rate 18, height 6\' 1"  (1.854 m), weight 72.1 kg, SpO2 100 %.    Medical Problem List and Plan: 1.  Decreased functional mobility secondary to acute respiratory failure decompensated diastolic congestive heart failure bilateral pleural effusions status post thoracentesis             -patient may shower             -ELOS/Goals: modI 5-7 days  Continue CIR 2.  Antithrombotics: -DVT/anticoagulation: Eliquis mg BID             -antiplatelet therapy: N/A 3. Pain Management: Tylenol as needed. Well controlled 4. Mood: Provide emotional support             -antipsychotic agents: N/A 5. Neuropsych: This patient is capable of making decisions on his own behalf. 6. Skin/Wound Care: Routine skin checks 7. Fluids/Electrolytes/Nutrition: Routine in and outs with follow-up chemistries 8.  Prostate cancer with bony metastasis.  Plans to follow-up outpatient Dr. [name] as well as Dr. Mickeal Skinner and plan to repeat MRI of the brain 2 months.  Continue Casodex 50 mg daily 9.  Atrial fibrillation with RVR.  Lanoxin 0.25 mg daily, Cardizem 30 mg every 8 hours, Toprol-XL 100 mg twice daily. HR is well controlled 10. Acute on chronic anemia. Latest hemoglobin 8.4. Follow-up CBC on Monday. 11.  Hypothyroidism.  Synthroid 54mcg daily.  Latest TSH 7.345.  Synthroid was recently initiated while in [place] earlier this month.  Plan to repeat TFTs 4 to 6 weeks. 12.  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  Foley catheter tube placed in [place].DO NOT REMOVE FOLEY TUBE FOR NOW 13. S/p cervical laminectomy with tumor  removal in June 2020. Repeat MRI cervical spine 11/5 was ordered for left upper extremity weakness and did not show recurrent epidural tumor; it does show diffuse osseous metastatic disease throughout the cervical spine with multilevle neural foraminal narrowing greateres on the left at C3-C4 > C4-C5: monitor for pain, left sided weakness, neurological deficits. Discussed results with patient.   Upper extremity weakness right shoulder greater than left shoulder as well as left grip weakness, history not consistent with rotator cuff issue given that deficits started after falling asleep.  This appears more neurogenic either related to his cervical spine issues versus brachial plexus.  He does not have pain in the shoulder, will check shoulder x-ray given his history of  metastatic prostate CA, question baseline post cervical spine decompression for epidural tumor. LOS: 3 days A FACE TO FACE EVALUATION WAS PERFORMED  Charlett Blake 07/08/2020, 10:24 AM

## 2020-07-08 NOTE — Progress Notes (Signed)
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 Hypothyroidism   . Prostate CA [facility])    Past Surgical History:  Past Surgical History:  Procedure Laterality Date  . IR THORACENTESIS ASP PLEURAL SPACE W/IMG GUIDE  06/27/2020  . IR THORACENTESIS ASP PLEURAL SPACE W/IMG GUIDE  06/28/2020  . LAMINECTOMY Bilateral 02/20/2019   Procedure: CERVICAL LAMINECTOMY FOR TUMOR;  Surgeon: Earnie Larsson, MD;  Location: [facility];  Service: Neurosurgery;  Laterality: Bilateral;  CERVICAL LAMINECTOMY FOR TUMOR  . LIGAMENT REPAIR Left    Social History:  reports that he has never smoked. He has never used smokeless tobacco. He reports current alcohol use. He reports that he does not use drugs.  Family / Support Systems Marital Status: Divorced Patient Roles: Other (Comment) (sibling) Other Supports: [name] [phone]  [name] [phone] Anticipated Caregiver: Brother and sister in-law Ability/Limitations of Caregiver: None Caregiver Availability: 24/7 Family Dynamics: Close with his brother and sister in-law. He has freinds and colleagues but will not rely on them. He is very independent and plans to be when he is discharged from the hospital.  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 Pt does have a HCPOA-Living will will need to be sure in his chart   Abuse/Neglect Abuse/Neglect Assessment Can Be Completed: Yes Physical Abuse: Denies Verbal Abuse: Denies Sexual Abuse: Denies Exploitation of patient/patient's resources: Denies Self-Neglect: Denies  Emotional Status Pt's affect, behavior and adjustment status: Pt can explain his cancer and hopes the MD's can manage it, he realizes they can not cure him. But could manage it and he could continue to live a fulfilling life. He feels much better and is gaining weight while here Recent Psychosocial Issues: current health issues hospitalized for SOB and weight loss this time Psychiatric History: No issues appears to be coping appropriately and motivated to do well. He would benefit from seeing neuro-psych while here for coping. 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 Both are very involved with his medical care and talk wth the MD's daily Premorbid pt/family roles/activities: Brother, Pharmacist, community, friend, colleague, etc Anticipated changes in roles/activities/participation: resume Pt/family expectations/goals: Pt states: " I expect to leave here not using a device and independent."  [employer]: None (Will be followed by [employer] at [place]) Premorbid Home Care/DME Agencies: None Transportation available at discharge: Brother and pt plans on driving himself when he leaves  Resource referrals recommended:  Neuropsychology  Discharge Planning Living Arrangements: Alone Support Systems: Other relatives, Friends/neighbors Type of Residence: Private residence Insurance Resources: Multimedia programmer (specify) [facility]) Financial Resources: Employment Financial Screen Referred: No Living Expenses: Own Money Management: Patient Does the patient have any problems obtaining your medications?: No Home Management: Self Patient/Family Preliminary Plans: Plans to go to brother's home where someone is there andthen eventually back to his home when he is stronger. He hopes to not be here long and does not plan to use a cane or walker at discharge. He is aware of the team conferene Wed and is looking forward to target discharge date. Care Coordinator Anticipated Follow Up Needs: HH/OP  Clinical Impression Pleasant gentleman who was making jokes. He seems to be doing well and in good spirits. He is looking forward to team conference Wed to know his discharge date. Neuro-psych to see him while here. Plans to go to brother's home at discharge for a short time  Elease Hashimoto 07/08/2020, 1:19 PM

## 2020-07-09 ENCOUNTER — Inpatient Hospital Stay (HOSPITAL_COMMUNITY): Payer: PRIVATE HEALTH INSURANCE | Admitting: Occupational Therapy

## 2020-07-09 ENCOUNTER — Inpatient Hospital Stay (HOSPITAL_COMMUNITY): Payer: PRIVATE HEALTH INSURANCE | Admitting: Physical Therapy

## 2020-07-09 ENCOUNTER — Inpatient Hospital Stay (HOSPITAL_COMMUNITY): Payer: PRIVATE HEALTH INSURANCE

## 2020-07-09 DIAGNOSIS — D649 Anemia, unspecified: Secondary | ICD-10-CM

## 2020-07-09 DIAGNOSIS — C61 Malignant neoplasm of prostate: Secondary | ICD-10-CM

## 2020-07-09 DIAGNOSIS — G893 Neoplasm related pain (acute) (chronic): Secondary | ICD-10-CM

## 2020-07-09 DIAGNOSIS — E0781 Sick-euthyroid syndrome: Secondary | ICD-10-CM

## 2020-07-09 DIAGNOSIS — N179 Acute kidney failure, unspecified: Secondary | ICD-10-CM

## 2020-07-09 DIAGNOSIS — C7951 Secondary malignant neoplasm of bone: Secondary | ICD-10-CM

## 2020-07-09 DIAGNOSIS — I4891 Unspecified atrial fibrillation: Secondary | ICD-10-CM

## 2020-07-09 DIAGNOSIS — N139 Obstructive and reflux uropathy, unspecified: Secondary | ICD-10-CM

## 2020-07-09 LAB — DIGOXIN LEVEL: Digoxin Level: 1.4 ng/mL (ref 1.0–2.0)

## 2020-07-09 NOTE — Progress Notes (Signed)
[facility] PHYSICAL MEDICINE & REHABILITATION PROGRESS NOTE   Subjective/Complaints: Patient seen sitting up in his chair this morning.  He states he slept fairly overnight due to being in the hospital.  He states he had a shoulder x-ray yesterday.  He states his shoulder feels better this morning.  ROS: + Mild shortness of breath.  Denies CP, N/V/D  Objective:   DG Shoulder Right  Result Date: 07/08/2020 CLINICAL DATA:  Shoulder weakness, bone metastasis, torn rotator cuff EXAM: RIGHT SHOULDER - 2+ VIEW COMPARISON:  Radiograph 02/20/2014 FINDINGS: A diffusely mottled sclerotic appearance of the bones is compatible with known osseous metastatic disease. There is a high-riding appearance of the humeral head which is suggestive of underlying rotator cuff insufficiency particularly in the setting of known rotator cuff tear. Moderate arthrosis of the glenohumeral and acromioclavicular joints. No acute traumatic osseous injury is present at this time. IMPRESSION: 1. Diffusely mottled sclerotic appearance of the bones compatible with known osseous metastatic disease. 2. High-riding appearance of the humeral head is suggestive of underlying rotator cuff insufficiency, particularly in the setting of known rotator cuff tear. 3. No acute fracture or traumatic malalignment. Electronically Signed   By: Lovena Le M.D.   On: 07/08/2020 19:23   Recent Labs    07/08/20 0757  WBC 5.3  HGB 9.7*  HCT 31.8*  PLT 326   Recent Labs    07/08/20 0757  NA 139  K 4.3  CL 108  CO2 23  GLUCOSE 133*  BUN 16  CREATININE 0.75  CALCIUM 8.1*    Intake/Output Summary (Last 24 hours) at 07/09/2020 0935 Last data filed at 07/09/2020 0700 Gross per 24 hour  Intake 220 ml  Output --  Net 220 ml        Physical Exam: Vital Signs Blood pressure 113/71, pulse 64, temperature 98.6 F (37 C), temperature source Oral, resp. rate 16, height 6\' 1"  (1.854 m), weight 72.1 kg, SpO2 100 %. Constitutional: No  distress . Vital signs reviewed. HENT: Normocephalic.  Atraumatic. Eyes: EOMI. No discharge. Cardiovascular: No JVD.  Irregularly irregular. Respiratory: Normal effort.  No stridor.  Bilateral clear to auscultation. GI: Non-distended.  BS +. GU: + Foley Skin: Warm and dry.  Intact.  Vascular changes bilateral lower extremities Psych: Normal mood.  Normal behavior. Musc: Lower extremity edema.  No tenderness in extremities. Neuro: Alert Bilateral upper extremities: Grossly 4/5 proximal distal Bilateral lower extremities: Hip flexion, knee extension 4/5, dorsiflexion 4+/5  Assessment/Plan: 1. Functional deficits secondary to debility after CHF exacerbation which require 3+ hours per day of interdisciplinary therapy in a comprehensive inpatient rehab setting.  Physiatrist is providing close team supervision and 24 hour management of active medical problems listed below.  Physiatrist and rehab team continue to assess barriers to discharge/monitor patient progress toward functional and medical goals  Care Tool:  Bathing    Body parts bathed by patient: Right arm, Left arm, Chest, Abdomen, Front perineal area, Buttocks, Right upper leg, Left upper leg, Left lower leg, Face, Right lower leg         Bathing assist Assist Level: Supervision/Verbal cueing     Upper Body Dressing/Undressing Upper body dressing   What is the patient wearing?: Hospital gown only    Upper body assist Assist Level: Minimal Assistance - Patient > 75%    Lower Body Dressing/Undressing Lower body dressing      What is the patient wearing?: Pants     Lower body assist Assist for lower body dressing:  Moderate Assistance - Patient 50 - 74%     Toileting Toileting    Toileting assist Assist for toileting: Supervision/Verbal cueing     Transfers Chair/bed transfer  Transfers assist     Chair/bed transfer assist level: Contact Guard/Touching assist     Locomotion Ambulation   Ambulation  assist      Assist level: Contact Guard/Touching assist Assistive device: No Device Max distance: 122ft   Walk 10 feet activity   Assist     Assist level: Contact Guard/Touching assist Assistive device: No Device   Walk 50 feet activity   Assist    Assist level: Contact Guard/Touching assist Assistive device: No Device    Walk 150 feet activity   Assist    Assist level: Contact Guard/Touching assist Assistive device: No Device    Walk 10 feet on uneven surface  activity   Assist     Assist level: Minimal Assistance - Patient > 75%     Wheelchair     Assist Will patient use wheelchair at discharge?: No             Wheelchair 50 feet with 2 turns activity    Assist            Wheelchair 150 feet activity     Assist          Blood pressure 113/71, pulse 64, temperature 98.6 F (37 C), temperature source Oral, resp. rate 16, height 6\' 1"  (1.854 m), weight 72.1 kg, SpO2 100 %.    Medical Problem List and Plan: 1.  Decreased functional mobility secondary to acute respiratory failure decompensated diastolic congestive heart failure bilateral pleural effusions status post thoracentesis  Continue CIR 2.  Antithrombotics: -DVT/anticoagulation: Eliquis mg BID             -antiplatelet therapy: N/A 3. Pain Management: Tylenol as needed.   Controlled on 1/9 4. Mood: Provide emotional support             -antipsychotic agents: N/A 5. Neuropsych: This patient is capable of making decisions on his own behalf.  6. Skin/Wound Care: Routine skin checks 7. Fluids/Electrolytes/Nutrition: Routine in and outs. 8.  Prostate cancer with bony metastasis.  Plans to follow-up outpatient Dr. [name] as well as Dr. Mickeal Skinner and plan to repeat MRI of the brain 2 months.  Continue Casodex 50 mg daily  Right shoulder x-ray with showing sequela of bony mets as well as chronic rotator cuff tear sequela 9.  Atrial fibrillation with RVR.  Lanoxin 0.25 mg  daily, Cardizem 30 mg every 8 hours, Toprol-XL 100 mg twice daily.   Rate controlled on 11/9 10. Acute on chronic anemia.   Hemoglobin 9.7 on 1/8  Continue to monitor 11. Hypothyroidism.  Synthroid 44mcg daily.  Latest TSH 7.345.  Synthroid was recently initiated while in [place] earlier this month.  Plan to repeat TFTs 4-6 weeks. 12. 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    Foley catheter tube placed in [place]. DO NOT REMOVE FOLEY TUBE FOR NOW 13. S/p cervical laminectomy with tumor removal in June 2020. 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    LOS: 4 days A FACE TO FACE EVALUATION WAS PERFORMED  Jaretssi Kraker Lorie Phenix 07/09/2020, 9:35 AM

## 2020-07-09 NOTE — Progress Notes (Signed)
Patient ID: Chris Morales, male   DOB: 18-May-1956, 64 y.o.   MRN: [id number] Spoke with Christina-RNCM for pt's insurance [facility]. She wanted to know pt's discharge needs and when he would be discharging. Informed her team conference tomorrow, will touch base with her tomorrow after meeting.

## 2020-07-09 NOTE — Progress Notes (Signed)
Occupational Therapy Session Note  Patient Details  Name: Chris Morales MRN: [id number] Date of Birth: 1955-10-13  Today's Date: 07/09/2020 OT Individual Time: 1410-1510 OT Individual Time Calculation (min): 60 min    Short Term Goals: Week 1:  OT Short Term Goal 1 (Week 1): STGs=LTGs due to ELOS  Skilled Therapeutic Interventions/Progress Updates:    Treatment session with focus on functional mobility, balance, endurance, and LUE strengthening.  Pt received upright in recliner reporting no pain.  Therapist had discussed with RN results of x-ray and limitations of RUE and possibility of ordering envelope sling - however pt refused envelope sling expressing desire to discuss with MD results and next steps before introducing anything.  Pt ambulated to therapy gym without AD with CGA.  Therapist encouraged pt to engage in endurance activities as pt with very low activity tolerance, however pt declined stating that he didn't want to "overdo it".  Pt expressing desire to continue to focus on LUE strengthening.  Engaged in [place] with 3 and 4" dumbbells with bicep curls, single hand chest presses, and elbow extension.  Therapist challenged pt to engage in shoulder flexion with overhead reach, pt declined attempts with weight therefore completed without weight.  Provided pt with level 1 theraband with pt completing shoulder external rotation and PNF pattern reaching while anchoring theraband in Rt hand.  Engaged in ambulation through obstacle course, incorporating bending down to retreive items from floor to challenge endurance and balance.  Pt actually picking up bean bags with Rt hand.  Engaged in reaching activity in standing with focus on increased shoulder flexion with overhead reaching.  Pt to reach for resistive clothespins to incorporate pinch and grip strength.  Pt unable to complete tip to tip of 3 jaw chuck having to utilize full hand grasp to remove clothespin due to decreased grasp in Lt  hand.  Pt required frequent rest breaks between each task taking 2-3 mins for each ~1 minute of activity.  Pt ambulated back to room and transferred back to recliner.  Pt remained upright in recliner with chair alarm on and all needs in reach.  Therapy Documentation Precautions:  Precautions Precautions: None Precaution Comments: edema in LLE, RTC injury in the Rt shoulder Restrictions Weight Bearing Restrictions: No General:   Vital Signs: Therapy Vitals Temp: 98.3 F (36.8 C) Pulse Rate: 68 Resp: 14 BP: 107/67 Patient Position (if appropriate): Sitting Oxygen Therapy SpO2: 99 % O2 Device: Room Air Pain:  Pt with no c/o pain   Therapy/Group: Individual Therapy  [name] 07/09/2020, 3:47 PM

## 2020-07-09 NOTE — Progress Notes (Signed)
Physical Therapy Session Note  Patient Details  Name: Chris Morales MRN: [id number] Date of Birth: Mar 07, 1956  Today's Date: 07/09/2020 PT Individual Time: 1530-1600 PT Individual Time Calculation (min): 30 min   Short Term Goals: Week 1:  PT Short Term Goal 1 (Week 1): = LTGS  Skilled Therapeutic Interventions/Progress Updates:    Patient in recliner in room.  Reports too fatigued to work on endurance on [place], prefers to work on L UE strengthening.  Sit to stand CGA, standing for one minute prior to ambulating far from chair due to some light headedness (reinforced for safety).  Pt ambulated to dayroom with CGA no device.  Patient seated EOM for using 2# bar for using L to assist R UE shoulder elevation with PT assist.  Seated in chair for table top flexion L shoulder and abduction x 10 each with education on needing to keep motion despite possible rotator cuff tear.  Seated for scapular squeezes 2 x 10 w/ 5 sec hold; L only 4 way rotator cuff strengthening with green t-band x 2 x 10 and bicep curls on L with green t-band 2 x 10.  Patient sit to stand with S and ambulated to room with CGA no device.  Left in recliner with needs in reach and chair alarm active.   Therapy Documentation Precautions:  Precautions Precautions: None Precaution Comments: edema in LLE, RTC injury in the Rt shoulder Restrictions Weight Bearing Restrictions: No Pain: Pain Assessment Pain Scale: 0-10 Pain Score: 0-No pain       Therapy/Group: Individual Therapy  Reginia Naas  Magda Kiel, PT 07/09/2020, 4:43 PM

## 2020-07-09 NOTE — Progress Notes (Signed)
Physical Therapy Session Note  Patient Details  Name: Chris Morales MRN: [id number] Date of Birth: 03-21-1956  Today's Date: 07/09/2020 PT Individual Time: 1102-1157 PT Individual Time Calculation (min): 55 min   Short Term Goals: Week 1:  PT Short Term Goal 1 (Week 1): = LTGS  Skilled Therapeutic Interventions/Progress Updates:   Received pt sitting in recliner, pt agreeable to therapy, and denied any pain during session. Session with emphasis on functional mobility/transfers, generalized strengthening, dynamic standing balance/coordination, ambulation, simulated car transfers, and improved activity tolerance. Pt ambulated >138ft without AD and CGA to ortho gym and required extensive rest break prior to performing ambulatory simulated car transfer without AD and close supervision. Pt ambulated 50ft on uneven surfaces (ramp and mulch) and navigated 1 curb with 1 UE support and CGA overall. Pt transferred sit<>stand without AD and worked on dynamic standing balance performing alternating toe taps to 3in step with 2x30 reps with CGA for balance. Pt transferred sit<>stand on [employer] with min A fading to CGA 1x5 and 1x4 reps with increased time in between reps to rest. Pt unable to stand without UE support despite cues and therapist raising height of mat. Pt ambulated >171ft without AD and CGA back to room. Concluded session with pt sitting in recliner, needs within reach, and chair pad alarm on.   Therapy Documentation Precautions:  Precautions Precautions: None Precaution Comments: edema in LLE, RTC injury in the Rt shoulder Restrictions Weight Bearing Restrictions: No  Therapy/Group: Individual Therapy Alfonse Alpers PT, DPT   07/09/2020, 7:28 AM

## 2020-07-09 NOTE — Progress Notes (Signed)
Physical Therapy Session Note  Patient Details  Name: Chris Morales MRN: [id number] Date of Birth: [birth year]  Today's Date: 07/09/2020 PT Individual Time: 5573-2202 PT Individual Time Calculation (min): 59 min   Short Term Goals: Week 1:  PT Short Term Goal 1 (Week 1): = LTGS  Skilled Therapeutic Interventions/Progress Updates:    Pt received sitting in recliner with B LEs elevated and agreeable to therapy session. Donned B LE thigh high TED hose max assist for time management (pt reports wearing thigh highs b/c of the increased swelling in BLEs). Sit<>stands, no AD, with close supervision for safety throughout session. Gait training ~285ft to main therapy gym, no AD, with CGA - once made it to the doorway of his room required prolonged standing rest break due to feeling symptoms of orthostatic hypotension but it dissipated and pt able to continue ambulating. Pt reports his primary goal is to address his B UE weakness then his secondary goals are to address endurance and balance. Pt reports he received an X-ray of his R shoulder to determine causes of his strength deficits. Assessed B UE strength in sitting with the following: R UE:  - no activation of external rotators noted (0/5) - some activation of internal rotators (1/5) - only minimal deltoid for shoulder abduction <30 degrees, appears to have no activation of supraspinatus (2-/5) - significant weakness in biceps/triceps (2-/5) - good grip strength (4/5) L UE:  - 3/5 external rotators - 3+/5 internal rotators  - 3+/5 biceps - 2-/5 triceps - weak grip strength  Educated pt on impaired R UE rotator cuff strength with limited/inability to progress strength training if an underlying pathology is present affecting the muscle/tendons - discussed only being able to address the surrounding musculature.  Performed the following L UE strengthening:  - R sidelying L shoulder external rotation against manual resistance x 12 reps  - supine L  elbow extension targeting triceps against gravity with pt requiring assistance to achieve movement 2x10 reps with some increased muscle activation noted in 2nd set - supine scapular punches (protraction/retraction) with therapist assisting with sustained elbow extension during the movement x 10 reps  - supine bicep curls against manual resistance x12 reps - supine internal rotation against manual resistance x 12 reps  Supine<>sit on mat with supervision between exercises as pt reports he is not comfortable lying in supine due to feeling that he cannot breathe well.   Gait ~225ft back to room, no AD, with CGA for steadying - continues to demo slow gait speed with slightly wider BOS. Pt left seated in recliner with B LEs elevated, needs in reach, and chair alarm on.    Therapy Documentation Precautions:  Precautions Precautions: None Precaution Comments: edema in LLE, RTC injury in the Rt shoulder Restrictions Weight Bearing Restrictions: No  Pain: Repeatedly denies pain during session.   Therapy/Group: Individual Therapy  Tawana Scale , PT, DPT, CSRS  07/09/2020, 9:37 AM

## 2020-07-10 ENCOUNTER — Inpatient Hospital Stay (HOSPITAL_COMMUNITY): Payer: PRIVATE HEALTH INSURANCE

## 2020-07-10 ENCOUNTER — Inpatient Hospital Stay (HOSPITAL_COMMUNITY): Payer: PRIVATE HEALTH INSURANCE | Admitting: Occupational Therapy

## 2020-07-10 ENCOUNTER — Encounter (HOSPITAL_COMMUNITY): Payer: PRIVATE HEALTH INSURANCE | Admitting: Psychology

## 2020-07-10 DIAGNOSIS — R29898 Other symptoms and signs involving the musculoskeletal system: Secondary | ICD-10-CM

## 2020-07-10 NOTE — Consult Note (Signed)
Neuropsychological Consultation   Patient:   Chris Morales   DOB:   April 20, 1956  MR Number:  [id number]  Location:  [facility] [id number] [place] [address] Dept: [place]: [phone]           Date of Service:   07/10/2020  Start Time:   2 PM End Time:   3 PM  Provider/Observer:  Ilean Skill, Psy.D.       Clinical Neuropsychologist       Billing Code/Service: 24401  Chief Complaint:    Chris Morales is a 64 year old male with history of prostate cancer diagnosed in June 2020.  There is a diffusely distributed metabolic status to bone maintained on Casodex, patient also has a history of A. fib, C3-4 laminectomy with tumor removal 01/2019 and hypothyroidism.  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  Patient was recently hospitalized at regional hospital facility and [facility] after the patient was extubated he was having some left-sided weakness particular with the left upper extremity.  There was a delay in MRI being obtained and the patient left AMA early morning 06/24/2020 to return to [place] where he has at home and his brother lives.  The patient immediately presented to the emergency department here and was scheduled for an MRI.  Patient still was in rapid A. fib at the time of his presentation at [facility].  MRI of the brain showed no acute infarction however there was some thickening and has been along the posterior falx cerebri with possible minimal associated peritoneal edema.  There were attempts to obtain an MRI of C-spine but the patient was unable to tolerate.  Patient will be followed up outpatient with oncology and there is a plan to have repeat MRI of the brain in 2 months.  Reason for Service:  Patient was referred for neuropsychological consultation due to coping adjustment with  significant medical issues including metastatic prostate cancer with tumor removal from cervical spine C3-4 little more than a year ago.  The patient has some degree of anxiety and stress associated with all his medical issues.  Below is the HPI for the current mission.  HPI: Chris Morales is a 64 year old right-handed male with history of prostate cancer diagnosed June 2020 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  He does have family in the [place] area who assist as needed.  Patient with complicated course he was recently hospitalized at [facility] in [place] from 10/17-10/25 for shortness of breath and atrial fibrillation he did require intubation for a short time.  Attempts were made at chemical cardioversion that failed and was placed on Eliquis.  Reportedly after patient was extubated he was having some left side weakness particularly left upper extremity.  He was frustrated as to the delay in getting an MRI of his brain decided to leave AMA early morning of 06/24/2020 and return to the [place] area to be closer to his brother who is a retired Software engineer and patient immediately after landing in the [place] area was brought to [facility] for ongoing evaluation.  In the ED  he was found to still be in rapid atrial fibrillation.  Chest x-ray showed substantial bilateral pleural effusions with evidence of diffuse metastatic lesions in the bones.  CT angiogram of the chest negative for pulmonary emboli.  He had a markedly elevated D-dimer of 19.33.  MRI of the brain showed no acute infarction however there was some thickening enhancement along the posterior falx cerebri with possible minimal associated parenchymal edema.   Attempts were made at MR C-spine but patient was unable to tolerate.  Admission chemistries glucose 153, calcium 7.5, alkaline phosphatase 2755, BNP 281, urine culture no growth, digoxin level 1.0, troponin negative, hemoglobin 9.0, WBC 8.4.  Patient currently remains on Lanoxin as well as Cardizem 30 mg every 8 hours with Eliquis ongoing as well as Toprol-XL 100 mg twice daily.  Echocardiogram with ejection fraction of 50 to 55% no wall motion abnormalities.  Patient did undergo IR ultrasound-guided right thoracentesis on 06/27/2020 with 1.6 L dark yellow fluid removed and left thoracentesis 06/28/2020 with 1.3 L bright yellow fluid removed and cultures continue to show no growth.  Latest chest x-ray 07/01/2020 showed no significant recurrence of left pleural fluid since thoracentesis stable bibasilar atelectasis.  Oncology services Dr. Jana Hakim have been consulted in regards to prostate cancer and remains on Casodex and current plans to follow-up outpatient with Dr. [name] as well as palliative care established with plan to repeat MRI of the brain in 2 months per Dr. Mickeal Skinner.  A Foley catheter tube currently remains in place for urinary retention awaiting plan for voiding trial.  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  Multilevel neural foraminal narrowing greatest on the left at C3-4 moderate to severe and C4-5 moderate.  Therapy evaluations completed and patient was admitted for a comprehensive rehab program.   Current Status:  The patient was alert and oriented when I entered the room sitting in chair with another chair in front to prop up his legs.  The patient was oriented x4 with good mental status and cognition.  Patient reported good mood and was quite talkative throughout showing good memory of recent  events and reasonable plan with hope to return to work in approximately 3 weeks.  The patient reports that he has been progressing well with therapeutic interventions and feels like he is getting stronger each day in his efforts.  The patient plans to continue these efforts post discharge.  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  At this point, it is unclear how realistic some of these plans are but the patient has a plan that he is working on trying to execute.  Behavioral Observation: Chris Morales  presents as a 64 y.o.-year-old Right African American Male who appeared his stated age. his dress was Appropriate and he was Well Groomed and his manners were Appropriate to the situation.  his participation was indicative of Appropriate and Attentive behaviors.  There were any physical disabilities noted.  he displayed an appropriate level of cooperation and motivation.     Interactions:    Active Appropriate and Attentive  Attention:   within normal limits and attention span and concentration were age appropriate  Memory:   within normal limits; recent and remote memory intact  Visuo-spatial:  not examined  Speech (Volume):  normal  Speech:   normal; normal  Thought Process:  Coherent and Relevant  Though Content:  WNL; not suicidal and not homicidal  Orientation:   person, place, time/date and situation  Judgment:   Good  Planning:   Fair  Affect:    Appropriate  Mood:    Anxious  Insight:   Good  Intelligence:   high   Medical History:   Past Medical History:  Diagnosis Date  . Hypothyroidism   . Prostate CA ([place])        Abuse/Trauma History: Patient is trying to cope with significant medical issues that have long-term significant implications.  The patient is being followed by palliative care for his metastatic prostate cancer.  Psychiatric History:  No prior psychiatric history  Family Med/Psych  History:  Family History  Problem Relation Age of Onset  . Prostate cancer Brother    Impression/DX:  Chris Morales is a 64 year old male with history of prostate cancer diagnosed in June 2020.  There is a diffusely distributed metabolic status to bone maintained on Casodex, patient also has a history of A. fib, C3-4 laminectomy with tumor removal 01/2019 and hypothyroidism.  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  Patient was recently hospitalized at regional hospital facility and [facility] after the patient was extubated he was having some left-sided weakness particular with the left upper extremity.  There was a delay in MRI being obtained and the patient left AMA early morning 06/24/2020 to return to [place] where he has at home and his brother lives.  The patient immediately presented to the emergency department here and was scheduled for an MRI.  Patient still was in rapid A. fib at the time of his presentation at [facility].  MRI of the brain showed no acute infarction however there was some thickening and has been along the posterior falx cerebri with possible minimal associated peritoneal edema.  There were attempts to obtain an MRI of C-spine but the patient was unable to tolerate.  Patient will be followed up outpatient with oncology and there is a plan to have repeat MRI of the brain in 2 months.  Diagnosis:    Debility - Plan: Ambulatory referral to Physical Medicine Rehab  Metastasis to spinal column ([place]) - Plan: bicalutamide (CASODEX) tablet 50 mg  Metastasis to bone ([place]) - Plan: DG Shoulder Right, DG Shoulder Right  Shoulder weakness - Plan: DG Shoulder Right, DG Shoulder Right         Electronically Signed   _______________________ Ilean Skill, Psy.D.

## 2020-07-10 NOTE — Plan of Care (Signed)
  Problem: RH Dressing Goal: LTG Patient will perform upper body dressing (OT) Description: LTG Patient will perform upper body dressing with assist, with/without cues (OT). Flowsheets (Taken 07/10/2020 1525) LTG: Pt will perform upper body dressing with assistance level of: (downgraded) Supervision/Verbal cueing Note: Downgraded due to slow progress towards goal and impairments secondary to RTC injury Goal: LTG Patient will perform lower body dressing w/assist (OT) Description: LTG: Patient will perform lower body dressing with assist, with/without cues in positioning using equipment (OT) Flowsheets (Taken 07/10/2020 1525) LTG: Pt will perform lower body dressing with assistance level of: (downgraded) Supervision/Verbal cueing Note: Downgraded due to slow progress towards goal and impairments secondary to RTC injury

## 2020-07-10 NOTE — Progress Notes (Signed)
Patient ID: Chris Morales, male   DOB: 1956/01/16, 64 y.o.   MRN: [id number]  Met with pt and spoke with brother via telephone to inform of team conference goals mod/i level and discharge date 11/14. Both are pleased with this plan. [name] wanted MD to call him regarding some questions he had. Text MD brother's number to call him. Brother would like the foley changed before he is discharged due to been in at least three weeks. Work on discharge needs, see if pt interested in home health therapies. His insurance would need to contract with a home health here and would be out of network.

## 2020-07-10 NOTE — Progress Notes (Signed)
Occupational Therapy Session Note  Patient Details  Name: Chris Morales MRN: [id number] Date of Birth: July 07, 1956  Today's Date: 07/10/2020 OT Individual Time: 0935-1030 OT Individual Time Calculation (min): 55 min    Short Term Goals: Week 1:  OT Short Term Goal 1 (Week 1): STGs=LTGs due to ELOS  Skilled Therapeutic Interventions/Progress Updates:    Treatment session with focus on self-care retraining, dynamic standing balance, dressing strategies, and energy conservation.  Pt received upright in recliner agreeable to shower.  Pt ambulated to bathroom with CGA initially fading to supervision.  Pt completed undressing with min assist due to Rt rotator cuff tear with decreased shoulder mobility.  Pt completed bathing with supervision at sit > stand level for energy conservation.  Pt reports need to toilet.  Ambulated to toilet with supervision.  Pt completed BM on toilet and then completed hygiene at standing level.  Engaged in dressing with education on dressing RUE first due to decreased ROM.  Pt required assistance with pulling shirt over head and when donning zip up jacket to pull jacket around back.  Pt with limited participation in LB dressing, asking questions about catheter bag and sequencing. Pt required physical assistance with donning pants, but pt able to pull pants over hips with supervision.  Therapist applied thigh high TEDS and pt requested therapist to don socks due to fatigue post shower.  Discussed energy conservation strategies for bathing and dressing, pt will benefit from continued focus on energy conservation as pt with decreased receptiveness about increased endurance and balance challenges during therapy sessions.  Pt remained seated upright in recliner with all needs in reach and RN arriving.  Therapy Documentation Precautions:  Precautions Precautions: None Precaution Comments: edema in LLE, RTC injury in the Rt shoulder Restrictions Weight Bearing Restrictions:  No Pain:  Pt with no c/o pain   Therapy/Group: Individual Therapy  [name] 07/10/2020, 12:12 PM

## 2020-07-10 NOTE — Progress Notes (Signed)
Physical Therapy Session Note  Patient Details  Name: Chris Morales MRN: [id number] Date of Birth: [birth year]  Today's Date: 07/10/2020 PT Individual Time: 7425-9563 and 1502-1527  PT Individual Time Calculation (min): 54 min and 25 min  Short Term Goals: Week 1:  PT Short Term Goal 1 (Week 1): = LTGS  Skilled Therapeutic Interventions/Progress Updates:   Treatment Session 1: 8756-4332 54 min Received pt sitting in recliner, pt agreeable to therapy, and denied any pain during session. Session with emphasis on functional mobility/transfers, generalized strengthening, dynamic standing balance/coordination, ambulation, and improved activity tolerance. Donned bilateral ted hose and non-skid socks total A for time management purposes. Pt requested RN to come and secure tape from peripheral line prior to leaving room. RN notified and present to re-apply tape. Pt with questions regarding severity of rotator cuff tear. Educated pt on anatomy behind tear as well as surgical and conservative options but encouraged pt to speak with MD regarding specifics behind tear. Pt declined working on endurance on treadmill this morning stating "I have no problems walking." Although pt demonstrates significant endurance deficits throughout sessions requiring multiple extended rest breaks throughout. Informed pt of noted strength deficits as well, as evidenced by pt's inability to actively lift LLE when putting his feet up to rest or when getting LLE onto footplate of Nustep. Pt ambulated 125ft x 2 trials to/from dayroom without AD and CGA/close supervision. Pt continued to demonstrate narrow BOS, decreased cadence, and decreased trunk rotation and arm swing. MD present for morning rounds and extensive discussion had regarding R rotator cuff tear, treatment options, and expectations for recovery. Pt also with questions regarding team conference and discharge date. Pt performed BLE and LUE strengthening on Nustep at workload 4  for 8 minutes for a total of 409 steps for improved cardiovascular endurance and LUE/LE strengthening with 1 rest break. Pt ambulated additional 37ft without AD and supervision and performed seated active assisted shoulder flexion towel slides with RUE 3x10 reps. Noted pt with decreased active shoulder flexion crawling fingers onto towel due to inability to actively lift RUE. Concluded session with pt sitting in recliner, needs within reach, and chair pad alarm on.   Treatment Session 2: 9518-8416 25 min Received pt sitting in recliner, pt agreeable to therapy, and denied any pain during session. Session with emphasis on functional mobility/transfers, generalized strengthening, dynamic standing balance/coordination, ambulation, and improved activity tolerance. Pt transferred sit<>stand from recliner with supervision, upon standing pt requested to wait, and then asked therapist to pull nearby chair up for him to sit. Pt stated sometimes when he stands he feels "wobbly" but if he sits for a few minutes and stands again he feels better. Pt ambulated 174ft x 2 trials without AD and close supervision to/from dayroom with same gait pattern mentioned in previous note. Engaged in dynamic standing balance and BUE strength and ROM playing cornhole x 1 trial using RUE and x 1 trial using LUE with CGA for balance. Pt able to reach ~75 degrees shoulder flexion on RUE to throw beanbag but lacking full ROM and eccentric control compared to ~120 degrees of shoulder flexion on LUE. Pt able to ambulate to beanbags and pick them up from floor x 2 trials with CGA and no LOB noted. However, pt required extensive seated rest breaks in between trials due to reports of SOB. Concluded session with pt sitting in recliner, needs within reach, and chair pad alarm on.   Therapy Documentation Precautions:  Precautions Precautions: None Precaution Comments: edema in  LLE, RTC injury in the Rt shoulder Restrictions Weight Bearing  Restrictions: No  Therapy/Group: Individual Therapy Alfonse Alpers PT, DPT   07/10/2020, 7:29 AM

## 2020-07-10 NOTE — Patient Care Conference (Signed)
Inpatient RehabilitationTeam Conference and Plan of Care Update Date: 07/10/2020   Time: 11:45 AM     Patient Name: Chris Morales      Medical Record Number: [id number]  Date of Birth: 16-Dec-1955 Sex: Male         Room/Bed: 4W14C/4W14C-01 Payor Info: Payor: GENERIC COMMERCIAL / Plan: GENERIC COMMERCIAL / Product Type: *No Product type* /    Admit Date/Time:  07/05/2020  6:14 PM  Primary Diagnosis:  Atrial fibrillation with rapid ventricular response [facility])  Hospital Problems: Principal Problem:   Atrial fibrillation with rapid ventricular response ([place]) Active Problems:   Cervical myelopathy ([facility])   AKI (acute kidney injury) ([place])   Prostate CA ([place])   Malignant neoplasm of prostate metastatic to bone ([place])   Metastasis to spinal column ([place])   Euthyroid sick syndrome   Left-sided weakness   Pleural effusion, bilateral   Debility   Metastasis to bone ([facility])   Obstructive uropathy   Cancer associated pain   Acute on chronic anemia   Atrial fibrillation [facility])   Shoulder weakness    Expected Discharge Date: Expected Discharge Date: 07/14/20  Team Members Present: Physician leading conference: Dr. Delice Lesch Nurse Present: Renda Rolls, LPN PT Present: Becky Sax, PT OT Present: [name], OT PPS Coordinator present : Gunnar Fusi, Novella Olive, PT     Current Status/Progress Goal Weekly Team Focus  Bowel/Bladder   Foley cath., continent of bowel     Assess QS   Swallow/Nutrition/ Hydration             ADL's   CGA ambulatory transfers without AD, Supervision bathing at shower level, Min assist UB dressing, Mod assist LB dressing  Mod I overall, Setup bathing and shower transfers  ADL retraining, endurance, problem solving UE ROM/strengthening   Mobility   CGA overall without AD  Mod I  functional mobility/transfers, generalized strengthening, dynamic standing balance/coordination, ambulation, stair navigation, and improved endurance.   Communication              Safety/Cognition/ Behavioral Observations            Pain   Denies pain  < 3  Assess QS/PRN   Skin               Discharge Planning:  Going to brother's home at discharge and then eventually back to home. He has no plans to use a assistive device at discharge.   Team Discussion: Bradycardia noted; MD holding medications and re-evaluating medications ordered. Foley in place and will continue for now. Noted shoulder issues (RTC) and staff providing recommendations for endurance/energy conservation. Patient requires frequent rest breaks due to deconditioned level and cues for safety . Patient self limits progress due to request to focus on areas other than therapy recommendations. Staff note edema of bilateral lower extremities and feet also affects balance. Patient declining use of adaptive equipment at discharge.  Patient on target to meet rehab goals: yes, mod I goals set. Currently CGA with gait. Patient able to manage 150' without an assistive device and 12 steps with min assistance. CGA-Supervision for showers.  *See Care Plan and progress notes for long and short-term goals.   Revisions to Treatment Plan:   Teaching Needs: Transfers, use of adaptive equipment, medications,foley management, etc.   Current Barriers to Discharge: Decreased caregiver support, Home enviroment access/layout and Behavior  Possible Resolutions to Barriers: Family education with brother     Medical Summary Current Status: Decreased functional mobility secondary to acute respiratory  failure decompensated diastolic congestive heart failure bilateral pleural effusions status post thoracentesis  Barriers to Discharge: Medical stability;Other (comments)  Barriers to Discharge Comments: Foley, chronic right rotator cuff injury Possible Resolutions to [employer] Focus: Therapies, follow labs - Hb, follow HR - adjust meds as necessary, continue foley   Continued Need for Acute Rehabilitation  Level of Care: The patient requires daily medical management by a physician with specialized training in physical medicine and rehabilitation for the following reasons: Direction of a multidisciplinary physical rehabilitation program to maximize functional independence : Yes Medical management of patient stability for increased activity during participation in an intensive rehabilitation regime.: Yes Analysis of laboratory values and/or radiology reports with any subsequent need for medication adjustment and/or medical intervention. : Yes   I attest that I was present, lead the team conference, and concur with the assessment and plan of the team.   Dorien Chihuahua B 07/10/2020, 4:03 PM

## 2020-07-10 NOTE — Plan of Care (Signed)
  Problem: Consults Goal: RH GENERAL PATIENT EDUCATION Description: See Patient Education module for education specifics. Outcome: Progressing Goal: Nutrition Consult-if indicated Outcome: Progressing   Problem: RH BOWEL ELIMINATION Goal: RH STG MANAGE BOWEL WITH ASSISTANCE Description: STG Manage Bowel with Assistance.- min assistance Outcome: Progressing Goal: RH STG MANAGE BOWEL W/MEDICATION W/ASSISTANCE Description: STG Manage Bowel with Medication with min Assistance. Outcome: Progressing   Problem: RH BLADDER ELIMINATION Goal: RH STG MANAGE BLADDER WITH ASSISTANCE Description: STG Manage Bladder With min Assistance Outcome: Progressing Goal: RH STG MANAGE BLADDER WITH EQUIPMENT WITH ASSISTANCE Description: STG Manage Bladder With Equipment With min  Assistance Outcome: Progressing   Problem: RH SKIN INTEGRITY Goal: RH STG MAINTAIN SKIN INTEGRITY WITH ASSISTANCE Description: STG Maintain Skin Integrity With min Assistance. Outcome: Progressing   Problem: RH SAFETY Goal: RH STG ADHERE TO SAFETY PRECAUTIONS W/ASSISTANCE/DEVICE Description: STG Adhere to Safety Precautions With min Assistance/Device. Outcome: Progressing   Problem: RH PAIN MANAGEMENT Goal: RH STG PAIN MANAGED AT OR BELOW PT'S PAIN GOAL Description: At or below level 4 Outcome: Progressing   Problem: RH KNOWLEDGE DEFICIT GENERAL Goal: RH STG INCREASE KNOWLEDGE OF SELF CARE AFTER HOSPITALIZATION Description: Patient will be able to manage care and direct others to assist with care needs at discharge using handouts and educational material provided with cues/reminders Outcome: Progressing

## 2020-07-10 NOTE — Progress Notes (Signed)
[place] PHYSICAL MEDICINE & REHABILITATION PROGRESS NOTE   Subjective/Complaints: Patient seen sitting up, working with therapies this AM.  He states he slept well overnight.  Discussed right rotator cuff injury at length with patient and therapies. Patient with questions regarding discharge date and outpatient care, discussed with patient.  Discussed plans for Foley as well.  Discussed digoxin with nursing as well as pharmacy regarding level in conjunction with heart rate/BP.  ROS: Denies CP, SOB, N/V/D  Objective:   DG Shoulder Right  Result Date: 07/08/2020 CLINICAL DATA:  Shoulder weakness, bone metastasis, torn rotator cuff EXAM: RIGHT SHOULDER - 2+ VIEW COMPARISON:  Radiograph 02/20/2014 FINDINGS: A diffusely mottled sclerotic appearance of the bones is compatible with known osseous metastatic disease. There is a high-riding appearance of the humeral head which is suggestive of underlying rotator cuff insufficiency particularly in the setting of known rotator cuff tear. Moderate arthrosis of the glenohumeral and acromioclavicular joints. No acute traumatic osseous injury is present at this time. IMPRESSION: 1. Diffusely mottled sclerotic appearance of the bones compatible with known osseous metastatic disease. 2. High-riding appearance of the humeral head is suggestive of underlying rotator cuff insufficiency, particularly in the setting of known rotator cuff tear. 3. No acute fracture or traumatic malalignment. Electronically Signed   By: Lovena Le M.D.   On: 07/08/2020 19:23   Recent Labs    07/08/20 0757  WBC 5.3  HGB 9.7*  HCT 31.8*  PLT 326   Recent Labs    07/08/20 0757  NA 139  K 4.3  CL 108  CO2 23  GLUCOSE 133*  BUN 16  CREATININE 0.75  CALCIUM 8.1*    Intake/Output Summary (Last 24 hours) at 07/10/2020 1041 Last data filed at 07/10/2020 1000 Gross per 24 hour  Intake 100 ml  Output 200 ml  Net -100 ml        Physical Exam: Vital Signs Blood  pressure 101/64, pulse (!) 58, temperature 98.3 F (36.8 C), resp. rate 18, height 6\' 1"  (1.854 m), weight 72.1 kg, SpO2 100 %. Constitutional: No distress . Vital signs reviewed. HENT: Normocephalic.  Atraumatic. Eyes: EOMI. No discharge. Cardiovascular: No JVD.  Irregularly irregular. Respiratory: Normal effort.  No stridor.  Bilateral clear to auscultation. GI: Non-distended.  BS +. GU: +Foley Skin: Warm and dry.  Intact. Vasc changes b/l LE. Psych: Normal mood.  Normal behavior. Musc: No edema in extremities. Bilateral lower extremity edema. Neuro: Alert LUE extremities: 4/5 proximal distal RUE: Shoulder abduction: 2-/5, elbow flexion/extention 3/5, hand grip 4/5 Bilateral lower extremities: Hip flexion, knee extension 4/5, dorsiflexion 4+/5  Assessment/Plan: 1. Functional deficits secondary to debility after CHF exacerbation which require 3+ hours per day of interdisciplinary therapy in a comprehensive inpatient rehab setting.  Physiatrist is providing close team supervision and 24 hour management of active medical problems listed below.  Physiatrist and rehab team continue to assess barriers to discharge/monitor patient progress toward functional and medical goals  Care Tool:  Bathing    Body parts bathed by patient: Right arm, Left arm, Chest, Abdomen, Front perineal area, Buttocks, Right upper leg, Left upper leg, Left lower leg, Face, Right lower leg         Bathing assist Assist Level: Supervision/Verbal cueing     Upper Body Dressing/Undressing Upper body dressing   What is the patient wearing?: Hospital gown only    Upper body assist Assist Level: Minimal Assistance - Patient > 75%    Lower Body Dressing/Undressing Lower body dressing  What is the patient wearing?: Pants     Lower body assist Assist for lower body dressing: Moderate Assistance - Patient 50 - 74%     Toileting Toileting    Toileting assist Assist for toileting: Supervision/Verbal  cueing     Transfers Chair/bed transfer  Transfers assist     Chair/bed transfer assist level: Supervision/Verbal cueing     Locomotion Ambulation   Ambulation assist      Assist level: Supervision/Verbal cueing Assistive device: No Device Max distance: 159ft   Walk 10 feet activity   Assist     Assist level: Supervision/Verbal cueing Assistive device: No Device   Walk 50 feet activity   Assist    Assist level: Supervision/Verbal cueing Assistive device: No Device    Walk 150 feet activity   Assist    Assist level: Contact Guard/Touching assist Assistive device: No Device    Walk 10 feet on uneven surface  activity   Assist     Assist level: Contact Guard/Touching assist Assistive device: Other (comment) (no device)   Wheelchair     Assist Will patient use wheelchair at discharge?: No             Wheelchair 50 feet with 2 turns activity    Assist            Wheelchair 150 feet activity     Assist          Blood pressure 101/64, pulse (!) 58, temperature 98.3 F (36.8 C), resp. rate 18, height 6\' 1"  (1.854 m), weight 72.1 kg, SpO2 100 %.    Medical Problem List and Plan: 1.  Decreased functional mobility secondary to acute respiratory failure decompensated diastolic congestive heart failure bilateral pleural effusions status post thoracentesis  Continue CIR  Team conference today to discuss current and goals and coordination of care, home and environmental barriers, and discharge planning with nursing, case manager, and therapies. Please see conference note from today as well.  2.  Antithrombotics: -DVT/anticoagulation: Eliquis mg BID             -antiplatelet therapy: N/A 3. Pain Management: Tylenol as needed.   Controlled on 11/10 4. Mood: Provide emotional support             -antipsychotic agents: N/A 5. Neuropsych: This patient is capable of making decisions on his own behalf.  6. Skin/Wound Care:  Routine skin checks 7. Fluids/Electrolytes/Nutrition: Routine in and outs. 8.  Prostate cancer with bony metastasis.  Plans to follow-up outpatient Dr. [name] as well as Dr. Mickeal Skinner and plan to repeat MRI of the brain 2 months.  Continue Casodex 50 mg daily  Right shoulder x-ray personally reviewed and discussed with patient and therapies-with showing sequela of bony mets as well as chronic rotator cuff tear sequela -discussed with patient and therapies 9.  Atrial fibrillation with RVR.    Lanoxin 0.25 mg daily on hold, plan to resume on 11/12 at 0.125 given hypotension/bradycardia -discussed with pharmacy   Dig level within normal limits on 11/9  Cardizem 30 mg every 8 hours  Toprol-XL 100 mg twice daily.   Bradycardic on 11/10 10. Acute on chronic anemia.   Hemoglobin 9.7 on 11/8  Continue to monitor 11. Hypothyroidism.  Synthroid 60mcg daily.  Latest TSH 7.345.  Synthroid was recently initiated while in [place] earlier this month.  Plan to repeat TFTs 4-6 weeks. 12. 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    Foley catheter tube placed in [place]. DO  NOT REMOVE FOLEY TUBE FOR NOW 13. S/p cervical laminectomy with tumor removal in June 2020. 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    LOS: 5 days A FACE TO FACE EVALUATION WAS PERFORMED  Ulonda Klosowski Lorie Phenix 07/10/2020, 10:41 AM

## 2020-07-10 NOTE — Progress Notes (Signed)
Patient appears to rest during the earlier part of the shift in bed for several hours, then up in recliner.Cooperative and deny pain,no significant changes in status. Edema to LE's 2+-3+,able to elevated LE's at times , Foley patent, monitor and assisted, Call bell within reach

## 2020-07-10 NOTE — Progress Notes (Signed)
Patient is alert and cooperative, denies pain upon questioning, placed self from bed to bedside chair,Foley patent without discomfort verbalized, Consuming po food brought in by brother earlier tonight, Edema remains to lower bilateral extremities left greater than right, somewhat elevated while in chair, encouraged to elevate. Verbalize why he is not on Lasix, will discuss this concern with His MD in the morning. Support provided, monitor and assisted.

## 2020-07-10 NOTE — Progress Notes (Signed)
Physical Therapy Session Note  Patient Details  Name: Chris Morales MRN: [id number] Date of Birth: Jan 13, 1956  Today's Date: 07/10/2020 PT Individual Time: 9872-1587 PT Individual Time Calculation (min): 43 min   Short Term Goals: Week 1:  PT Short Term Goal 1 (Week 1): = LTGS  Skilled Therapeutic Interventions/Progress Updates:     Pt received seated in recliner and agrees to therapy. No complaint of pain. Stand step transfer to [facility] with supervision and PT managing foley bag. WC transport to gym for time management and energy conservation.  Pt performs NMR for standing balance, activity tolerance, and B upper extremity motor planning, performing ball toss activity with trampoline. Pt performs reps of 20 in standing with PT providing CGA. Initially pt performs on stable ground and progresses to performing on airex mat for increased challenge. Pt requires extended seated rest breaks between each bout. PT cues on L upper extremity mechanics and sequencing for improved performance and efficiency of use.  Pt ambulates 100' back to room with supervision and cues to increase gait speed and bilateral stride length, as well as maintain upright gaze to improve posture and balance. Pt left seated in recliner with all needs within reach.  Therapy Documentation Precautions:  Precautions Precautions: None Precaution Comments: edema in LLE, RTC injury in the Rt shoulder Restrictions Weight Bearing Restrictions: No    Therapy/Group: Individual Therapy  Breck Coons, PT, DPT 07/10/2020, 4:05 PM

## 2020-07-11 ENCOUNTER — Inpatient Hospital Stay (HOSPITAL_COMMUNITY): Payer: PRIVATE HEALTH INSURANCE

## 2020-07-11 ENCOUNTER — Inpatient Hospital Stay (HOSPITAL_COMMUNITY): Payer: PRIVATE HEALTH INSURANCE | Admitting: Physical Therapy

## 2020-07-11 DIAGNOSIS — R600 Localized edema: Secondary | ICD-10-CM

## 2020-07-11 DIAGNOSIS — R531 Weakness: Secondary | ICD-10-CM

## 2020-07-11 MED ORDER — LEVOTHYROXINE SODIUM 25 MCG PO TABS: 25.0000 ug | ORAL_TABLET | Freq: Every day | ORAL | 0 refills | Status: DC

## 2020-07-11 MED ORDER — DILTIAZEM HCL 30 MG PO TABS: 30.0000 mg | ORAL_TABLET | Freq: Three times a day (TID) | ORAL | 0 refills | Status: DC

## 2020-07-11 MED ORDER — PANTOPRAZOLE SODIUM 40 MG PO TBEC: 40.0000 mg | DELAYED_RELEASE_TABLET | Freq: Every day | ORAL | 0 refills | Status: DC

## 2020-07-11 MED ORDER — BICALUTAMIDE 50 MG PO TABS: 50.0000 mg | ORAL_TABLET | Freq: Every day | ORAL | 0 refills | Status: DC

## 2020-07-11 MED ORDER — FOLIC ACID 1 MG PO TABS: 1.0000 mg | ORAL_TABLET | Freq: Every day | ORAL | 0 refills | Status: DC

## 2020-07-11 MED ORDER — DIGOXIN 125 MCG PO TABS: 0.1250 mg | ORAL_TABLET | Freq: Every day | ORAL | 0 refills | Status: DC

## 2020-07-11 MED ORDER — ACETAMINOPHEN 325 MG PO TABS: 650.0000 mg | ORAL_TABLET | ORAL | Status: DC | PRN

## 2020-07-11 MED ORDER — METOPROLOL SUCCINATE ER 100 MG PO TB24: 100.0000 mg | ORAL_TABLET | Freq: Two times a day (BID) | ORAL | 0 refills | Status: DC

## 2020-07-11 MED ORDER — DIGOXIN 125 MCG PO TABS
0.1250 mg | ORAL_TABLET | Freq: Every day | ORAL | Status: DC
  Administered 2020-07-12: 0.125 mg via ORAL
  Filled 2020-07-11: qty 1

## 2020-07-11 MED ORDER — APIXABAN 5 MG PO TABS: 5.0000 mg | ORAL_TABLET | Freq: Two times a day (BID) | ORAL | 1 refills | Status: DC

## 2020-07-11 MED ORDER — FUROSEMIDE 20 MG PO TABS
20.0000 mg | ORAL_TABLET | Freq: Once | ORAL | Status: AC
  Administered 2020-07-11: 20 mg via ORAL
  Filled 2020-07-11: qty 1

## 2020-07-11 NOTE — Progress Notes (Signed)
Foley catheter removed at this time. Pt tolerated well. Pt voided in toilet after removal. No complications noted. Sediment, cloudy urine emptied from catheter bag. Sheela Stack, LPN

## 2020-07-11 NOTE — Progress Notes (Signed)
Physical Therapy Session Note  Patient Details  Name: Chris Morales MRN: [id number] Date of Birth: 1955-10-30  Today's Date: 07/11/2020 PT Individual Time: 7939-0300 PT Individual Time Calculation (min): 26 min   Short Term Goals: Week 1:  PT Short Term Goal 1 (Week 1): = LTGS  Skilled Therapeutic Interventions/Progress Updates:    Pt received sitting in recliner and agreeable to therapy session. Continues to report primary concern is B UE strength impairments. Sit<>stands, no AD, with supervision for safety during session, no unsteadiness noted. After coming to stand pt required a seated rest break prior to ambulating to therapy gym due to onset of orthostatic hypotension symptoms - pt reports this happens when he sits for >1hour. Gait training ~136ft towards day room, no AD, with close supervision for safety - continues to demo slow gait speed with decreased trunk rotation and arm swing. Pt reports need to use bathroom - used day room restroom. Gait in/out bathroom, no AD, with supervision. Standing with supervision for safety able to manage clothing without assist and void bladder. Gait ~20ft remainder of distance into day room with supervision, continues with above gait impairments. Sit<>supine on mat table independently. Supine L UE strengthening via bicep curls against manual resistance ~12 reps. Pt reports onset of SOB with this activity requiring prolonged seated rest break. Gait training ~168ft back to room, no AD, with continued close supervision and pt continuing to ambulate with decreased gait speed. Left seated in recliner with needs in reach, chair alarm on, and B LEs elevated.  Therapy Documentation Precautions:  Precautions Precautions: Other (comment) Precaution Comments: Edema in BLE, RTC injury R shoulder Restrictions Weight Bearing Restrictions: No  Pain:  No reports of pain during session.   Therapy/Group: Individual Therapy  Tawana Scale , PT, DPT,  CSRS  07/11/2020, 9:16 PM

## 2020-07-11 NOTE — Progress Notes (Signed)
Physical Therapy Discharge Summary  Patient Details  Name: Chris Morales MRN: [id number] Date of Birth: [birth year]   Patient has met 8 of 8 long term goals due to improved activity tolerance, improved balance, improved postural control, increased strength and improved coordination.  Patient to discharge at an ambulatory level Modified Independent. Pt's family did not attend family education training. However, pt to discharge home at Mod I level overall and reports he has assist from his brother if needed.   All goals met   Recommendation:  Patient will benefit from ongoing skilled PT services in outpatient setting to continue to advance safe functional mobility, address ongoing impairments in generalized weakness (particuarly in BUE), decreased balance/postural control, gait training, endurance, and to minimize fall risk.  Equipment: No equipment provided  Reasons for discharge: treatment goals met  Patient/family agrees with progress made and goals achieved: Yes  PT Discharge Precautions/Restrictions Precautions Precautions: Other (comment) Precaution Comments: Edema in BLE, RTC injury R shoulder Restrictions Weight Bearing Restrictions: No Cognition Overall Cognitive Status: Within Functional Limits for tasks assessed Arousal/Alertness: Awake/alert Orientation Level: Oriented X4 Memory: Appears intact Awareness: Appears intact Problem Solving: Appears intact Safety/Judgment: Appears intact Sensation Sensation Light Touch: Impaired by gross assessment Proprioception: Appears Intact Additional Comments: decreased sensation along L L3-L4 dermatome Coordination Gross Motor Movements are Fluid and Coordinated: No Fine Motor Movements are Fluid and Coordinated: No Finger Nose Finger Test: Able to complete on the R but decreased ROM due to RTC tear; dysmetric on the L side Heel Shin Test: [facility] bilaterally Motor  Motor Motor: Other (comment) Motor - Skilled Clinical  Observations: generalized weakness and poor endurance with activity  Mobility Bed Mobility Bed Mobility: Rolling Right;Rolling Left;Sit to Supine;Supine to Sit Rolling Right: Independent Rolling Left: Independent Supine to Sit: Independent Sit to Supine: Independent Transfers Transfers: Sit to Stand;Stand to Sit;Stand Pivot Transfers Sit to Stand: Independent with assistive device (increased time) Stand to Sit: Independent with assistive device (increased time) Stand Pivot Transfers: Independent with assistive device (increased time) Transfer (Assistive device): None Locomotion  Gait Ambulation: Yes Gait Assistance: Independent with assistive device Gait Distance (Feet): 150 Feet Assistive device: None Gait Gait: Yes Gait Pattern: Impaired Gait Pattern: Step-through pattern;Decreased stride length;Narrow base of support;Poor foot clearance - left;Poor foot clearance - right;Decreased trunk rotation Gait velocity: reduced Stairs / Additional Locomotion Stairs: Yes Stairs Assistance: Independent with assistive device Stair Management Technique: Two rails Number of Stairs: 12 Height of Stairs: 6 Ramp: Supervision/Verbal cueing Wheelchair Mobility Wheelchair Mobility: No  Trunk/Postural Assessment  Cervical Assessment Cervical Assessment: Within Functional Limits Thoracic Assessment Thoracic Assessment: Within Functional Limits Lumbar Assessment Lumbar Assessment: Within Functional Limits Postural Control Postural Control: Deficits on evaluation  Balance Balance Balance Assessed: Yes Static Sitting Balance Static Sitting - Balance Support: Feet supported;No upper extremity supported Static Sitting - Level of Assistance: 7: Independent Dynamic Sitting Balance Dynamic Sitting - Balance Support: No upper extremity supported;Feet supported Dynamic Sitting - Level of Assistance: 7: Independent Static Standing Balance Static Standing - Balance Support: No upper extremity  supported Static Standing - Level of Assistance: 6: Modified independent (Device/Increase time) Dynamic Standing Balance Dynamic Standing - Balance Support: No upper extremity supported Dynamic Standing - Level of Assistance: 6: Modified independent (Device/Increase time) Dynamic Standing - Comments: increased time required with static and dynamic standing balance Extremity Assessment  RLE Assessment RLE Assessment: Exceptions to [facility] General Strength Comments: grossly generalized to 4-/5 (except hip adduction 3+/5) LLE Assessment LLE Assessment: Exceptions to [facility] General Strength  Comments: grossly generalized to 4-/5 (except hip adduction and knee flexion 3+/5)  Alfonse Alpers PT, DPT  07/11/2020, 10:48 AM

## 2020-07-11 NOTE — Progress Notes (Signed)
Physical Therapy Session Note  Patient Details  Name: Chris Morales MRN: [id number] Date of Birth: 04-05-1956  Today's Date: 07/11/2020 PT Individual Time: 0093-8182 and 9937-1696 PT Individual Time Calculation (min): 53 min and 24 min  Short Term Goals: Week 1:  PT Short Term Goal 1 (Week 1): = LTGS  Skilled Therapeutic Interventions/Progress Updates:   Treatment Session 1: 7893-8101 53 min Received pt sitting in recliner requesting therapist check with RN to see when urology was going to come adjust catheter. However, RN unsure. Pt agreeable to therapy, and denied any pain during session. Session with emphasis on functional mobility/transfers, BUE strengthening, dynamic standing balance/coordination, ambulation, and improved activity tolerance. Donned bilateral ted hose and non-skid socks total A and donned clean gown min A. Pt began to ambulate out of room but again requested to sit once standing due to orthostatics. Pt then reported urge to urinate and therapist provided pt with towel to collect urine per pt request, as since last night catheter dislodged resulting in pt leaking urine. However, pt reported being able to feel sensation of needing to urinate. Pt ambulated 177ft x 2 trials to/from dayroom without AD and close supervision. Pt continues to demonstrate narrow BOS, decreased cadence, decreased trunk rotation, and downward gaze with gait. Pt performed the following strengthening exercises standing with supervision and verbal/visual cues for technique: -RUE shoulder isometrics (flexion, extension, IR, adduction,and abduction) standing at wall 10x15 second hold each direction -active assisted shoulder flexion towel slides x12 bilaterally. Pt able to achieve ~60 degrees flexion on RUE and ~90 degrees on LUE with reports of fatigue. -seated active assisted UE abd/add windshield wiper towel slides x10 bilaterally Pt required multiple extened rest breaks throughout session due to SOB and poor  activity activity tolerance. Concluded session with pt sitting in recliner, needs within reach, and chair pad alarm on.   Treatment Session 2: 1300-1324 24 min Received pt sitting in recliner, pt agreeable to therapy, and denied any pain during session. Pt reported he is waiting on urology to come to take out catheter. Session with emphasis on functional mobility/transfers, generalized strengthening, dynamic standing balance/coordination, ambulation, toileting, stair navigation, and improved activity tolerance. Pt ambulated 25ft without AD and supervision towards therapy gym then reported sudden urge to urinate. Pt performed toilet transfer with supervision and required min A for clothing management. Pt able to void minimally and perform hygiene management with supervision. However, pt required increased time with toileting due to catheter displacement resulting in urine leakage. Pt stood at sink and washed hands with supervision. Pt ambulated additional 85ft without AD and supervision to therapy gym and navigated 4 steps with 2 rails with supervision ascending and descending with a step through pattern. Pt continues to require multiple extended rest breaks throughout session due to poor activity tolerance. Pt ambulated additional 144ft without AD and supervision back to room. Noted pt with improved stride length and increased arm swing/trunk rotation with gait compared to previous sessions. Concluded session with pt sitting in recliner, needs within reach, and chair pad alarm on.   Therapy Documentation Precautions:  Precautions Precautions: None Precaution Comments: edema in LLE, RTC injury in the Rt shoulder Restrictions Weight Bearing Restrictions: No  Therapy/Group: Individual Therapy Alfonse Alpers PT, DPT   07/11/2020, 7:27 AM

## 2020-07-11 NOTE — Progress Notes (Signed)
Patient up to BR while NT( Cherise) assist, NT observed patient urinating on BR floor after manipulating foley catheter to the side. In communicating the issue with the patient he states" he was relieving the pressure of the urine around his scrotum and need to see a urologist hopefully today"/ Will report this concern with oncoming nurse to inform MD, denies pain

## 2020-07-11 NOTE — Progress Notes (Signed)
When rounding patient found up in BR,states he had to go and use that BR,informed he was to call for assistance, gait unsteady as he ambulated back to bedside chair, continue to encourage to call for staff  Assistance/  0540 Called to room by NT( Cherise),states patient's c/o of foley catheter linking,writer  Assessed foley site and no leakage noted, assured patient that catheter was intact and will be monitored.Foley care provided per protocol.

## 2020-07-11 NOTE — Progress Notes (Signed)
Occupational Therapy Session Note  Patient Details  Name: Chris Morales MRN: [id number] Date of Birth: 23-Sep-1955  Today's Date: 07/11/2020 OT Individual Time: 1100-1200 OT Individual Time Calculation (min): 60 min    Short Term Goals: Week 1:  OT Short Term Goal 1 (Week 1): STGs=LTGs due to ELOS  Skilled Therapeutic Interventions/Progress Updates:    Pt resting in recliner upon arrival.  Pt declined bathing/dressing this morning, preferring to concentrate on UE strengthening. Pt with RUE RTC(?) old injury (per pt). Focus on LUE strengthening with theraband, 3# bar bell, and cuff weights (2 x 1.5#). Pt also focused on functional amb in room without AD. Focus on LUE exercises included bicep with theraband, adduction and abduction with theraband, shoulder abduction with 3# cuff weights (10x3 for all exercises). Pt remained in recliner with all needs within reach. BLE resting on standard chair placed in front of recliner.  Therapy Documentation Precautions:  Precautions Precautions: Other (comment) Precaution Comments: Edema in BLE, RTC injury R shoulder Restrictions Weight Bearing Restrictions: No Pain: Pt denies pain this morning  Therapy/Group: Individual Therapy  Leroy Libman 07/11/2020, 12:16 PM

## 2020-07-11 NOTE — Progress Notes (Signed)
Pt and family member concerned that pt's systolic BP in the low 991A and that pt has 4+pitting edema to BLLE. Pt states he was placed on Lasix by provider.

## 2020-07-11 NOTE — Progress Notes (Signed)
[facility]IC[facility]ABILITATION PROGRESS NOTE   Subjective/Complaints: Patient seen sitting up in his chair this AM.  He states he slept well overnight.  Per nursing, patient pushing foley aside and urinating around foley.  Discussed with patient as well urination. Discussed edema with patient.   ROS: Denies CP, SOB, N/V/D  Objective:   No results found. No results for input(s): WBC, HGB, HCT, PLT in the last 72 hours. No results for input(s): NA, K, CL, CO2, GLUCOSE, BUN, CREATININE, CALCIUM in the last 72 hours.  Intake/Output Summary (Last 24 hours) at 07/11/2020 1028 Last data filed at 07/11/2020 0821 Gross per 24 hour  Intake 848 ml  Output 450 ml  Net 398 ml        Physical Exam: Vital Signs Blood pressure 128/77, pulse 74, temperature 98.2 F (36.8 C), resp. rate 18, height 6\' 1"  (1.854 m), weight 72.1 kg, SpO2 100 %.  Constitutional: No distress . Vital signs reviewed. HENT: Normocephalic.  Atraumatic. Eyes: EOMI. No discharge. Cardiovascular: No JVD.  Irregularly irregular. Respiratory: Normal effort.  No stridor.  Bilateral clear to auscultation. GI: Non-distended.  BS +. GU: +Foley Scrotal edema Skin: Warm and dry.  Intact. Vasc changes b/l LE. Psych: Normal mood.  Normal behavior. Musc: Bilateral LE edema. No tenderness in extremities. Neuro: Alert LUE extremities: 4/5 proximal distal RUE: Shoulder abduction: 2-/5, elbow flexion/extention 4/5, hand grip 4/5 Bilateral lower extremities: Hip flexion, knee extension 4/5, dorsiflexion 4+/5  Assessment/Plan: 1. Functional deficits secondary to debility after CHF exacerbation which require 3+ hours per day of interdisciplinary therapy in a comprehensive inpatient rehab setting.  Physiatrist is providing close team supervision and 24 hour management of active medical problems listed below.  Physiatrist and rehab team continue to assess barriers to discharge/monitor patient progress toward  functional and medical goals  Care Tool:  Bathing    Body parts bathed by patient: Right arm, Left arm, Chest, Abdomen, Front perineal area, Buttocks, Right upper leg, Left upper leg, Left lower leg, Face, Right lower leg         Bathing assist Assist Level: Supervision/Verbal cueing     Upper Body Dressing/Undressing Upper body dressing   What is the patient wearing?: Pull over shirt    Upper body assist Assist Level: Minimal Assistance - Patient > 75%    Lower Body Dressing/Undressing Lower body dressing      What is the patient wearing?: Pants     Lower body assist Assist for lower body dressing: Contact Guard/Touching assist     Toileting Toileting    Toileting assist Assist for toileting: Supervision/Verbal cueing     Transfers Chair/bed transfer  Transfers assist     Chair/bed transfer assist level: Supervision/Verbal cueing     Locomotion Ambulation   Ambulation assist      Assist level: Supervision/Verbal cueing Assistive device: No Device Max distance: 100'   Walk 10 feet activity   Assist     Assist level: Supervision/Verbal cueing Assistive device: No Device   Walk 50 feet activity   Assist    Assist level: Supervision/Verbal cueing Assistive device: No Device    Walk 150 feet activity   Assist    Assist level: Contact Guard/Touching assist Assistive device: No Device    Walk 10 feet on uneven surface  activity   Assist     Assist level: Contact Guard/Touching assist Assistive device: Other (comment) (no device)   Wheelchair     Assist Will patient use wheelchair at  discharge?: No             Wheelchair 50 feet with 2 turns activity    Assist            Wheelchair 150 feet activity     Assist          Blood pressure 128/77, pulse 74, temperature 98.2 F (36.8 C), resp. rate 18, height 6\' 1"  (1.854 m), weight 72.1 kg, SpO2 100 %.    Medical Problem List and Plan: 1.   Decreased functional mobility secondary to acute respiratory failure decompensated diastolic congestive heart failure bilateral pleural effusions status post thoracentesis  Continue CIR  Detailed discussion with patient's brother regarding comorbidites, working, weakness, prognosis, interventions, etc. 2.  Antithrombotics: -DVT/anticoagulation: Eliquis mg BID             -antiplatelet therapy: N/A 3. Pain Management: Tylenol as needed.   Controlled on 11/11 4. Mood: Provide emotional support             -antipsychotic agents: N/A 5. Neuropsych: This patient is capable of making decisions on his own behalf.  6. Skin/Wound Care: Routine skin checks 7. Fluids/Electrolytes/Nutrition: Routine in and outs. 8.  Prostate cancer with bony metastasis.  Plans to follow-up outpatient Dr. [name] as well as Dr. Mickeal Skinner and plan to repeat MRI of the brain 2 months.  Continue Casodex 50 mg daily  Right shoulder x-ray personally reviewed and discussed with patient and therapies-with showing sequela of bony mets as well as chronic rotator cuff tear sequela -discussed with patient and therapies 9.  Atrial fibrillation with RVR.    Lanoxin 0.25 mg daily on hold, plan to resume on 11/12 at 0.125 given hypotension/bradycardia -discussed with pharmacy   Dig level within normal limits on 11/9  Cardizem 30 mg every 8 hours  Toprol-XL 100 mg twice daily.   HR relatively controlled on 11/12 10. Acute on chronic anemia.   Hemoglobin 9.7 on 11/8  Continue to monitor 11. Hypothyroidism.  Synthroid 53mcg daily.  Latest TSH 7.345.  Synthroid was recently initiated while in [place] earlier this month.  Plan to repeat TFTs 4-6 weeks. 12. 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    Foley catheter tube placed in [place].   Will discuss with Urology, malfunctioning foley and removal vs replacement. 13. S/p cervical laminectomy with tumor removal in June 2020. 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   14. LE edema  Will give Lasix 20 x1 and monitor BP to ensure avoiding hypotension  LOS: 6 days A FACE TO FACE EVALUATION WAS PERFORMED  Rosa Gambale Lorie Phenix 07/11/2020, 10:28 AM

## 2020-07-12 ENCOUNTER — Inpatient Hospital Stay (HOSPITAL_COMMUNITY): Payer: PRIVATE HEALTH INSURANCE

## 2020-07-12 ENCOUNTER — Inpatient Hospital Stay (HOSPITAL_COMMUNITY): Payer: PRIVATE HEALTH INSURANCE | Admitting: Physical Therapy

## 2020-07-12 ENCOUNTER — Observation Stay (HOSPITAL_COMMUNITY): Payer: PRIVATE HEALTH INSURANCE

## 2020-07-12 ENCOUNTER — Inpatient Hospital Stay (HOSPITAL_COMMUNITY): Payer: PRIVATE HEALTH INSURANCE | Admitting: Occupational Therapy

## 2020-07-12 ENCOUNTER — Inpatient Hospital Stay (HOSPITAL_COMMUNITY)
Admission: AD | Admit: 2020-07-12 | Discharge: 2020-07-19 | DRG: 291 | Disposition: A | Discharge status: Home / Self Care | Payer: PRIVATE HEALTH INSURANCE | Source: Ambulatory Visit | Attending: Internal Medicine | Admitting: Internal Medicine

## 2020-07-12 DIAGNOSIS — R0602 Shortness of breath: Secondary | ICD-10-CM

## 2020-07-12 DIAGNOSIS — C7951 Secondary malignant neoplasm of bone: Secondary | ICD-10-CM

## 2020-07-12 DIAGNOSIS — I4819 Other persistent atrial fibrillation: Secondary | ICD-10-CM

## 2020-07-12 DIAGNOSIS — I272 Pulmonary hypertension, unspecified: Secondary | ICD-10-CM

## 2020-07-12 DIAGNOSIS — I4892 Unspecified atrial flutter: Secondary | ICD-10-CM | POA: Diagnosis not present

## 2020-07-12 DIAGNOSIS — Q211 Atrial septal defect: Secondary | ICD-10-CM

## 2020-07-12 DIAGNOSIS — J9601 Acute respiratory failure with hypoxia: Secondary | ICD-10-CM | POA: Diagnosis present

## 2020-07-12 DIAGNOSIS — I361 Nonrheumatic tricuspid (valve) insufficiency: Secondary | ICD-10-CM

## 2020-07-12 DIAGNOSIS — I4891 Unspecified atrial fibrillation: Secondary | ICD-10-CM

## 2020-07-12 DIAGNOSIS — R600 Localized edema: Secondary | ICD-10-CM

## 2020-07-12 DIAGNOSIS — E039 Hypothyroidism, unspecified: Secondary | ICD-10-CM | POA: Diagnosis present

## 2020-07-12 DIAGNOSIS — I50812 Chronic right heart failure: Secondary | ICD-10-CM | POA: Diagnosis present

## 2020-07-12 DIAGNOSIS — Z79890 Hormone replacement therapy (postmenopausal): Secondary | ICD-10-CM

## 2020-07-12 DIAGNOSIS — I959 Hypotension, unspecified: Secondary | ICD-10-CM | POA: Diagnosis not present

## 2020-07-12 DIAGNOSIS — I4821 Permanent atrial fibrillation: Secondary | ICD-10-CM | POA: Diagnosis present

## 2020-07-12 DIAGNOSIS — Z79899 Other long term (current) drug therapy: Secondary | ICD-10-CM

## 2020-07-12 DIAGNOSIS — E8770 Fluid overload, unspecified: Secondary | ICD-10-CM | POA: Diagnosis present

## 2020-07-12 DIAGNOSIS — D638 Anemia in other chronic diseases classified elsewhere: Secondary | ICD-10-CM | POA: Diagnosis present

## 2020-07-12 DIAGNOSIS — Z7901 Long term (current) use of anticoagulants: Secondary | ICD-10-CM

## 2020-07-12 DIAGNOSIS — C61 Malignant neoplasm of prostate: Secondary | ICD-10-CM

## 2020-07-12 DIAGNOSIS — E8809 Other disorders of plasma-protein metabolism, not elsewhere classified: Secondary | ICD-10-CM

## 2020-07-12 DIAGNOSIS — E038 Other specified hypothyroidism: Secondary | ICD-10-CM

## 2020-07-12 DIAGNOSIS — Z8042 Family history of malignant neoplasm of prostate: Secondary | ICD-10-CM

## 2020-07-12 DIAGNOSIS — I5033 Acute on chronic diastolic (congestive) heart failure: Principal | ICD-10-CM | POA: Diagnosis present

## 2020-07-12 DIAGNOSIS — R609 Edema, unspecified: Secondary | ICD-10-CM

## 2020-07-12 DIAGNOSIS — I2729 Other secondary pulmonary hypertension: Secondary | ICD-10-CM | POA: Diagnosis present

## 2020-07-12 HISTORY — DX: Dyspnea, unspecified: R06.00

## 2020-07-12 LAB — BRAIN NATRIURETIC PEPTIDE: B Natriuretic Peptide: 271 pg/mL — ABNORMAL HIGH (ref 0.0–100.0)

## 2020-07-12 LAB — COMPREHENSIVE METABOLIC PANEL
ALT: 17 U/L (ref 0–44)
AST: 17 U/L (ref 15–41)
Albumin: 2.9 g/dL — ABNORMAL LOW (ref 3.5–5.0)
Alkaline Phosphatase: 2412 U/L — ABNORMAL HIGH (ref 38–126)
Anion gap: 12 (ref 5–15)
BUN: 19 mg/dL (ref 8–23)
CO2: 21 mmol/L — ABNORMAL LOW (ref 22–32)
Calcium: 8.1 mg/dL — ABNORMAL LOW (ref 8.9–10.3)
Chloride: 106 mmol/L (ref 98–111)
Creatinine, Ser: 0.87 mg/dL (ref 0.61–1.24)
GFR, Estimated: >60 mL/min (ref >60)
Glucose, Bld: 103 mg/dL — ABNORMAL HIGH (ref 70–99)
Potassium: 4.3 mmol/L (ref 3.5–5.1)
Sodium: 139 mmol/L (ref 135–145)
Total Bilirubin: 0.9 mg/dL (ref 0.3–1.2)
Total Protein: 5.8 g/dL — ABNORMAL LOW (ref 6.5–8.1)

## 2020-07-12 LAB — CBC
HCT: 30.9 % — ABNORMAL LOW (ref 39.0–52.0)
Hemoglobin: 9.1 g/dL — ABNORMAL LOW (ref 13.0–17.0)
MCH: 29.1 pg (ref 26.0–34.0)
MCHC: 29.4 g/dL — ABNORMAL LOW (ref 30.0–36.0)
MCV: 98.7 fL (ref 80.0–100.0)
Platelets: 290 10*3/uL (ref 150–400)
RBC: 3.13 MIL/uL — ABNORMAL LOW (ref 4.22–5.81)
RDW: 23.7 % — ABNORMAL HIGH (ref 11.5–15.5)
WBC: 5.6 10*3/uL (ref 4.0–10.5)
nRBC: 6.1 % — ABNORMAL HIGH (ref 0.0–0.2)

## 2020-07-12 LAB — TROPONIN I (HIGH SENSITIVITY)
Troponin I (High Sensitivity): 17 ng/L (ref <18)
Troponin I (High Sensitivity): 19 ng/L — ABNORMAL HIGH (ref <18)

## 2020-07-12 LAB — ECHOCARDIOGRAM LIMITED
Height: 73 in
S' Lateral: 3.4 cm
Weight: 2543.23 oz

## 2020-07-12 MED ORDER — DILTIAZEM HCL ER COATED BEADS 120 MG PO CP24
120.0000 mg | ORAL_CAPSULE | Freq: Every day | ORAL | Status: DC
  Administered 2020-07-12: 120 mg via ORAL
  Filled 2020-07-12: qty 1

## 2020-07-12 MED ORDER — ACETAMINOPHEN 325 MG PO TABS: 650.0000 mg | ORAL_TABLET | ORAL | Status: DC | PRN

## 2020-07-12 MED ORDER — APIXABAN 5 MG PO TABS
5.0000 mg | ORAL_TABLET | Freq: Two times a day (BID) | ORAL | Status: DC
  Administered 2020-07-12 – 2020-07-19 (×14): 5 mg via ORAL
  Filled 2020-07-12 (×14): qty 1

## 2020-07-12 MED ORDER — FOLIC ACID 1 MG PO TABS
1.0000 mg | ORAL_TABLET | Freq: Every day | ORAL | Status: DC
  Administered 2020-07-12 – 2020-07-19 (×8): 1 mg via ORAL
  Filled 2020-07-12 (×8): qty 1

## 2020-07-12 MED ORDER — BICALUTAMIDE 50 MG PO TABS
50.0000 mg | ORAL_TABLET | Freq: Every day | ORAL | Status: DC
  Administered 2020-07-13 – 2020-07-19 (×7): 50 mg via ORAL
  Filled 2020-07-12 (×7): qty 1

## 2020-07-12 MED ORDER — FUROSEMIDE 40 MG PO TABS
40.0000 mg | ORAL_TABLET | Freq: Every day | ORAL | Status: DC
  Administered 2020-07-12: 40 mg via ORAL
  Filled 2020-07-12: qty 1

## 2020-07-12 MED ORDER — DIGOXIN 125 MCG PO TABS
0.1250 mg | ORAL_TABLET | Freq: Every day | ORAL | Status: DC
  Administered 2020-07-13 – 2020-07-19 (×7): 0.125 mg via ORAL
  Filled 2020-07-12 (×7): qty 1

## 2020-07-12 MED ORDER — ONDANSETRON HCL 4 MG/2ML IJ SOLN: 4.0000 mg | Freq: Four times a day (QID) | INTRAMUSCULAR | Status: DC | PRN

## 2020-07-12 MED ORDER — POLYETHYLENE GLYCOL 3350 17 G PO PACK: 17.0000 g | PACK | Freq: Every day | ORAL | Status: DC | PRN

## 2020-07-12 MED ORDER — LEVOTHYROXINE SODIUM 25 MCG PO TABS
25.0000 ug | ORAL_TABLET | Freq: Every day | ORAL | Status: DC
  Administered 2020-07-13 – 2020-07-19 (×7): 25 ug via ORAL
  Filled 2020-07-12 (×7): qty 1

## 2020-07-12 MED ORDER — DILTIAZEM HCL ER COATED BEADS 120 MG PO CP24
120.0000 mg | ORAL_CAPSULE | Freq: Every day | ORAL | Status: DC
  Administered 2020-07-13 – 2020-07-19 (×7): 120 mg via ORAL
  Filled 2020-07-12 (×7): qty 1

## 2020-07-12 MED ORDER — DILTIAZEM HCL ER COATED BEADS 120 MG PO CP24: 120.0000 mg | ORAL_CAPSULE | Freq: Every day | ORAL | 0 refills | Status: DC

## 2020-07-12 MED ORDER — FUROSEMIDE 10 MG/ML IJ SOLN
60.0000 mg | Freq: Two times a day (BID) | INTRAMUSCULAR | Status: DC
  Administered 2020-07-12 – 2020-07-18 (×11): 60 mg via INTRAVENOUS
  Filled 2020-07-12 (×11): qty 6

## 2020-07-12 MED ORDER — FUROSEMIDE 40 MG PO TABS: 40.0000 mg | ORAL_TABLET | Freq: Every day | ORAL | Status: DC

## 2020-07-12 MED ORDER — METOPROLOL SUCCINATE ER 100 MG PO TB24
100.0000 mg | ORAL_TABLET | Freq: Two times a day (BID) | ORAL | Status: DC
  Administered 2020-07-12 – 2020-07-19 (×11): 100 mg via ORAL
  Filled 2020-07-12 (×12): qty 1

## 2020-07-12 MED ORDER — ONDANSETRON HCL 4 MG PO TABS: 4.0000 mg | ORAL_TABLET | Freq: Four times a day (QID) | ORAL | Status: DC | PRN

## 2020-07-12 NOTE — Progress Notes (Signed)
Occupational Therapy Session Note  Patient Details  Name: Chris Morales MRN: [id number] Date of Birth: [birth year]  Today's Date: 07/12/2020 OT Individual Time: 0935-1020 OT Individual Time Calculation (min): 45 min    Short Term Goals: Week 1:  OT Short Term Goal 1 (Week 1): STGs=LTGs due to ELOS  Skilled Therapeutic Interventions/Progress Updates:    Treatment session with focus on activity tolerance, balance, and BUE strengthening.  Pt received upright in recliner declining bathing/dressing this session.  Pt expressing desire to focus on "arm strength".  Pt completed sit > stand and short distance ambulation to w/c before requiring seated rest break due to symptoms of orthostatic hypotension, pt reports this happens frequently after prolonged sitting. Pt ambulated to therapy gym without AD with supervision.  Engaged in LUE wall slides, wall pushups, and RUE isometrics (IR, abduction, adduction).  Pt required frequent rest breaks between each set of 15.  Pt ambulated back to room and returned to upright in recliner with all needs in reach.  Therapy Documentation Precautions:  Precautions Precautions: Other (comment) Precaution Comments: Edema in BLE, RTC injury R shoulder Restrictions Weight Bearing Restrictions: No General:   Vital Signs:   Pain:  Pt with no c/o pain  Therapy/Group: Individual Therapy  [name] 07/12/2020, 12:35 PM

## 2020-07-12 NOTE — Discharge Summary (Addendum)
Physician Discharge Summary  Patient ID: Chris Morales MRN: [id number] DOB/AGE: September 08, 1955 64 y.o.  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 EXAM: CHEST  1 VIEW COMPARISON:  06/28/2020. FINDINGS: Mediastinum and hilar structures normal. Stable cardiomegaly. Bilateral interstitial prominence again noted with interim improvement from prior exam. Findings suggests improving CHF. Improved left pleural effusion. Tiny right pleural effusion. No pneumothorax post thoracentesis. IMPRESSION: Findings consistent with improving CHF. Improved left pleural effusion. No pneumothorax post thoracentesis. Tiny right pleural effusion. Electronically Signed   By: Marcello Moores  Register   On: 06/28/2020 10:55   DG Chest 1 View  Result Date: 06/27/2020 CLINICAL DATA:  Post right thoracentesis. EXAM: CHEST  1 VIEW COMPARISON:  06/24/2020 FINDINGS: Interval improvement in right pleural effusion. No pneumothorax. Improved aeration right lung base Interval progression of left pleural effusion and left lower lobe atelectasis Extensive sclerotic bony changes compatible with metastatic prostate cancer. IMPRESSION: Improved right pleural effusion with no pneumothorax  post thoracentesis Progression of left effusion and left lower lobe atelectasis. Bony metastatic disease. Electronically Signed   By: Franchot Gallo M.D.   On: 06/27/2020 12:06   DG Shoulder Right  Result Date: 07/08/2020 CLINICAL DATA:  Shoulder weakness, bone metastasis, torn rotator cuff EXAM: RIGHT SHOULDER - 2+ VIEW COMPARISON:  Radiograph 02/20/2014 FINDINGS: A diffusely mottled sclerotic appearance of the bones is compatible with known osseous metastatic disease. There is a high-riding appearance of the humeral head which is suggestive of underlying rotator cuff insufficiency particularly in the setting of known rotator cuff tear. Moderate arthrosis of the glenohumeral and acromioclavicular joints. No acute traumatic osseous injury is present at this time. IMPRESSION: 1. Diffusely mottled sclerotic appearance of the bones compatible with known osseous metastatic disease. 2. High-riding appearance of the humeral head is suggestive of underlying rotator cuff insufficiency, particularly in the setting of known rotator cuff tear. 3. No acute fracture or traumatic malalignment. Electronically Signed   By: Lovena Le M.D.   On: 07/08/2020 19:23   CT ANGIO CHEST PE W OR WO CONTRAST  Result Date: 06/25/2020 CLINICAL DATA:  Worsening shortness of breath EXAM: CT ANGIOGRAPHY CHEST WITH CONTRAST TECHNIQUE: Multidetector CT imaging of the chest was performed using the standard protocol during bolus administration of intravenous contrast. Multiplanar CT image reconstructions and MIPs were obtained to evaluate the vascular anatomy. CONTRAST:  175mL OMNIPAQUE IOHEXOL 350 MG/ML SOLN COMPARISON:  Radiograph same day FINDINGS: Cardiovascular: There is a optimal opacification of the pulmonary arteries. There is no central,segmental, or subsegmental filling defects within the pulmonary arteries. There is mild cardiomegaly present. No pericardial effusion or thickening. No evidence right heart strain. There is normal  three-vessel brachiocephalic anatomy without proximal stenosis. The thoracic aorta is normal in appearance. Mediastinum/Nodes: No hilar, mediastinal, or axillary  adenopathy. Thyroid gland, trachea, and esophagus demonstrate no significant findings. Lungs/Pleura: There is moderate to large bilateral pleural effusion with adjacent compressive atelectasis seen. Patchy rounded ground-glass opacities are seen predominantly within the right upper lung and right middle lobe. Upper Abdomen: No acute abnormalities present in the visualized portions of the upper abdomen. There is diffuse anasarca present. Musculoskeletal: Extensive osseous sclerotic blastic lesions are seen throughout the visualized portion of the axial and appendicular skeleton. Review of the MIP images confirms the above findings. IMPRESSION: No central, segmental, or subsegmental pulmonary embolism Moderate to large bilateral pleural effusions with adjacent compressive atelectasis. Ground-glass opacities within the right upper lung and right middle lobe, likely due to infectious or inflammatory etiology. Extensive diffuse osseous metastatic disease. Electronically Signed   By: Prudencio Pair M.D.   On: 06/25/2020 03:11   MR BRAIN W WO CONTRAST  Result Date: 06/25/2020 CLINICAL DATA:  Left-sided weakness, metastatic prostate cancer EXAM: MRI HEAD WITHOUT AND WITH CONTRAST TECHNIQUE: Multiplanar, multiecho pulse sequences of the brain and surrounding structures were obtained without and with intravenous contrast. CONTRAST:  38mL GADAVIST GADOBUTROL 1 MMOL/ML IV SOLN COMPARISON:  2020 FINDINGS: Brain: There is thickening and enhancement along the posterior falx measuring 5 mm in thickness. The adjacent superior sagittal sinus is uninvolved. There is new minimal adjacent T2 FLAIR hyperintensity in the right parietal lobe that may reflect associated edema. There is no acute infarction or intracranial hemorrhage. There is no parenchymal mass or mass effect.  There is no hydrocephalus or extra-axial fluid collection. Prominence of the ventricles and sulci reflects minor generalized parenchymal volume loss. Patchy foci of T2 hyperintensity in the supratentorial white matter are nonspecific but probably reflect mild chronic microvascular ischemic changes. These findings have progressed since 2020. Vascular: Major vessel flow voids at the skull base are preserved. Skull and upper cervical spine: Abnormal marrow signal likely reflecting diffuse metastatic disease. Sinuses/Orbits: Chronic right maxillary sinusitis. Orbits are unremarkable. Other: Sella is unremarkable.  Bilateral mastoid effusions. IMPRESSION: No acute infarction. Thickening enhancement along the posterior falx cerebri with possible minimal associated parenchymal edema. Differential considerations include dural-based metastasis (more likely given normal appearance on prior study) and meningioma. Diffuse osseous metastatic disease. Electronically Signed   By: Macy Mis M.D.   On: 06/25/2020 08:24   MR CERVICAL SPINE W WO CONTRAST  Addendum Date: 07/05/2020   ADDENDUM REPORT: 07/05/2020 13:24 ADDENDUM: I was subsequently contacted by Dr. Curly Shores of neurology who directed my attention to subtle dural thickening and enhancement at the C5-C6 level. The dura measures 1-2 mm in thickness at this level. This finding may be postoperative in etiology. Trace dural-based tumor is difficult to definitively exclude. Consider contrast-enhanced MRI follow-up for surveillance. No resultant significant spinal cord mass effect at this level. This addendum was discussed with Dr. Curly Shores by telephone at approximately 1:30 p.m. on 07/05/2020. Electronically Signed   By: Kellie Simmering DO   On: 07/05/2020 13:24   Result Date: 07/05/2020 CLINICAL DATA:  Myelopathy, acute or progressive; left arm weakness. Additional history provided: History of prostate cancer metastatic to bone, status post cervical laminectomy with tumor  removal in June 2020. EXAM: MRI CERVICAL SPINE WITHOUT AND WITH CONTRAST TECHNIQUE: Multiplanar and multiecho pulse sequences of the cervical spine, to include the craniocervical junction and cervicothoracic junction, were obtained without and with intravenous contrast. CONTRAST:  61mL GADAVIST GADOBUTROL 1 MMOL/ML IV SOLN COMPARISON:  Radiographs of the cervical spine 02/20/2019. Cervical spine MRI 02/19/2019. Cervical spine MRI 02/20/2019. FINDINGS: Alignment: Reversal  of the expected cervical lordosis. No significant spondylolisthesis. Vertebrae: Vertebral body height is maintained. Diffuse metastatic disease throughout the cervical spine. Since the prior MRI of 02/20/2019, there has been interval C4, C5 and C6 laminectomies for resection of epidural metastatic tumor and microdiscectomy. Multilevel degenerative endplate irregularity. Cord: No spinal cord signal abnormality. Posterior Fossa, vertebral arteries, paraspinal tissues: No abnormality identified within included portions of the posterior fossa. Bilateral mastoid effusions. Flow voids preserved within the imaged cervical vertebral arteries. Paraspinal soft tissues within normal limits. Unchanged nonspecific 13 mm round T2 hyperintense subcutaneous lesion within the posterior right upper back (series 8, image 36). Disc levels: Unless otherwise stated, the level by level findings below have not significantly changed since prior MRIs of 02/20/2019 and 02/19/2019. Severe disc degeneration at C3-C4, C4-C5, C5-C6, C6-C7 and C7-T1. C2-C3: No significant disc herniation or stenosis. C3-C4: Disc bulge. Uncovertebral and facet hypertrophy. The disc bulge effaces the ventral thecal sac, contacting and minimally flattening the ventral spinal cord with overall mild relative spinal canal narrowing. Moderate/severe left neural foraminal narrowing. C4-C5: Interval laminectomies for resection of previous dorsal epidural tumor. Posterior disc osteophyte complex. Uncinate  and facet hypertrophy. No significant spinal canal stenosis. Bilateral neural foraminal narrowing (mild right, moderate left). C5-C6: Interval laminectomies for resection of previous dorsal epidural tumor. Posterior disc osteophyte complex. Uncinate and facet hypertrophy. The disc osteophyte complex minimally effaces the ventral thecal sac without significant spinal canal stenosis. Mild/moderate bilateral neural foraminal narrowing. C6-C7: Posterior disc osteophyte complex. Uncinate and facet hypertrophy. Mild spinal canal stenosis. Bilateral neural foraminal narrowing (mild right, moderate left). C7-T1: This level is imaged sagittally. No significant spinal canal stenosis. Mild bilateral neural foraminal narrowing. IMPRESSION: Since the prior MRI of 02/20/2019, there has been interval C4, C5 and C6 laminectomies for resection of previous dorsal epidural metastatic tumor. No evidence of recurrent epidural tumor at this site. No convincing evidence of epidural metastatic disease within the cervical spine. 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 Cervical spondylosis as outlined and having not significantly changed from the cervical spine MRI of 02/20/2020. Severe disc degeneration throughout the majority of the cervical spine. No more than mild spinal canal stenosis at any level. At C3-C4, a disc bulge contributes to mild relative spinal canal narrowing, contacting and minimally flattening the ventral spinal cord. Multilevel neural foraminal narrowing greatest on the left at C3-C4 (moderate/severe), on the left at C4-C5 (moderate) and on the left at C6-C7 (moderate). 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 EXAM: PORTABLE CHEST 1 VIEW COMPARISON:  06/28/2020 FINDINGS: The heart size and  mediastinal contours are within normal limits. No significant recurrence of left pleural fluid since thoracentesis with probable small pleural effusion remaining. No pneumothorax. Stable bibasilar atelectasis. Stable diffuse sclerotic metastatic disease involving the thoracic spine. IMPRESSION: No significant recurrence of left pleural fluid since thoracentesis with probable small pleural effusion remaining. Stable bibasilar atelectasis. Electronically Signed   By: Aletta Edouard M.D.   On: 07/01/2020 08:03   DG CHEST PORT 1 VIEW  Result Date: 06/28/2020 CLINICAL DATA:  Shortness of breath. EXAM: PORTABLE CHEST 1 VIEW COMPARISON:  06/27/2020. FINDINGS: Mediastinum and heart size normal. Progression of diffuse left lung interstitial infiltrates/edema. Diffuse right lung interstitial infiltrates/edema noted on today's exam. Persistent left-sided pleural effusion without interim change. No pneumothorax. Extensive sclerotic bony metastatic disease  again noted. IMPRESSION: 1. Progression of diffuse left lung interstitial infiltrates/edema. Diffuse right lung interstitial infiltrates/edema noted on today's exam. Persistent left-sided pleural effusion without interim change. 2. Extensive sclerotic bony metastatic disease again noted. Electronically Signed   By: [place]   On: 06/28/2020 05:52   DG Chest Portable 1 View  Result Date: 06/24/2020 CLINICAL DATA:  Tachycardia. EXAM: PORTABLE CHEST 1 VIEW COMPARISON:  February 19, 2019 FINDINGS: There is cardiomegaly. There are moderate to large bilateral pleural effusions. There is bibasilar atelectasis. There is diffuse sclerosis throughout the visualized osseous structures most notably the thoracic spine. There is no pneumothorax. IMPRESSION: 1. Moderate to large bilateral pleural effusions with adjacent atelectasis. 2. Cardiomegaly. 3. Progressive sclerosis throughout the visualized osseous structures consistent with metastatic disease. Electronically Signed    By: Constance Holster M.D.   On: 06/24/2020 21:33   US LIVER DOPPLER  Result Date: 06/25/2020 CLINICAL DATA:  64 year old male with a history of liver disease EXAM: DUPLEX ULTRASOUND OF LIVER TECHNIQUE: Color and duplex Doppler ultrasound was performed to evaluate the hepatic in-flow and out-flow vessels. COMPARISON:  None. FINDINGS: Portal Vein Velocities Main:  43 cm/sec Right:  28 cm/sec Left:  18 cm/sec Hepatic Vein Velocities Right:  77 cm/sec Middle:  78 cm/sec Left:  92 cm/sec Hepatic Artery Velocity:  135 cm/sec Splenic Vein Velocity:  24 cm/sec Varices: Absent Ascites: Absent Spleen volume estimated 52 cubic cm Pleural fluid bilaterally IMPRESSION: Unremarkable duplex of the hepatic vasculature. Bilateral pleural effusions Electronically Signed   By: Corrie Mckusick D.O.   On: 06/25/2020 14:47   ECHOCARDIOGRAM COMPLETE BUBBLE STUDY  Result Date: 06/25/2020    ECHOCARDIOGRAM REPORT   Patient Name:   ARGUS CARAHER Date of Exam: 06/25/2020 Medical Rec #:  [id number]    Height:       73.0 in Accession #:    [id number]   Weight:       150.0 lb Date of Birth:  07/09/1956    BSA:          1.904 m Patient Age:    21 years     BP:           111/75 mmHg Patient Gender: M            HR:           126 bpm. Exam Location:  Inpatient Procedure: 2D Echo, Cardiac Doppler, Color Doppler and Saline Contrast Bubble            Study Indications:    Atrial fibrillation with rapid ventricular response ([place])  History:        Patient has no prior history of Echocardiogram examinations.  Sonographer:    Bernadene Person RDCS Referring Phys: [id number] [place]  1. Left ventricular ejection fraction, by estimation, is 50 to 55%. The left ventricle has low normal function. The left ventricle has no regional wall motion abnormalities. Left ventricular diastolic parameters were normal. There is the interventricular septum is flattened in diastole ('D' shaped left ventricle), consistent with right ventricular  volume overload.  2. Right ventricular systolic function is mildly reduced. The right ventricular size is mildly enlarged. There is mildly elevated pulmonary artery systolic pressure. The estimated right ventricular systolic pressure is 65.4 mmHg.  3. Left atrial size was mildly dilated.  4. Right atrial size was mildly dilated.  5. Large pleural effusion in the left lateral region.  6. The mitral valve is normal in structure. Mild mitral valve regurgitation.  7. Tricuspid valve regurgitation is mild to moderate.  8. The aortic valve is normal in structure. Aortic valve regurgitation is not visualized. Mild aortic valve sclerosis is present, with no evidence of aortic valve stenosis.  9. The inferior vena cava is dilated in size with <50% respiratory variability, suggesting right atrial pressure of 15 mmHg. 10. Evidence of atrial level shunting detected by color flow Doppler. Agitated saline contrast bubble study was positive with shunting observed within 3-6 cardiac cycles suggestive of interatrial shunt. Comparison(s): No prior Echocardiogram. Findings are suggestive of right heart volume overload due to left-to-right shunt and there is a small right to left interatrial shunt by saline contrast study. However, an atrial septal defect could not be outlined with 2D and color Doppler imaging. Consider TEE or CT angiography to evaluate for anormalous pulmonary vein return or occult ASD. Qp:Qs shunt fraction could not be accurately calculated due to poor quality RV outflow imaging. FINDINGS  Left Ventricle: Left ventricular ejection fraction, by estimation, is 50 to 55%. The left ventricle has low normal function. The left ventricle has no regional wall motion abnormalities. The left ventricular internal cavity size was normal in size. There is no left ventricular hypertrophy. The interventricular septum is flattened in diastole ('D' shaped left ventricle), consistent with right ventricular volume overload. Left  ventricular diastolic parameters were normal. Normal left ventricular filling pressure. Right Ventricle: The right ventricular size is mildly enlarged. No increase in right ventricular wall thickness. Right ventricular systolic function is mildly reduced. There is mildly elevated pulmonary artery systolic pressure. The tricuspid regurgitant  velocity is 2.60 m/s, and with an assumed right atrial pressure of 15 mmHg, the estimated right ventricular systolic pressure is 05.3 mmHg. Left Atrium: Left atrial size was mildly dilated. Right Atrium: Right atrial size was mildly dilated. Pericardium: There is no evidence of pericardial effusion. Mitral Valve: The mitral valve is normal in structure. Mild mitral valve regurgitation. Tricuspid Valve: The tricuspid valve is grossly normal. Tricuspid valve regurgitation is mild to moderate. Aortic Valve: The aortic valve is normal in structure. Aortic valve regurgitation is not visualized. Mild aortic valve sclerosis is present, with no evidence of aortic valve stenosis. Pulmonic Valve: The pulmonic valve was not well visualized. Pulmonic valve regurgitation is not visualized. Aorta: The aortic root and ascending aorta are structurally normal, with no evidence of dilitation. Venous: The inferior vena cava is dilated in size with less than 50% respiratory variability, suggesting right atrial pressure of 15 mmHg. IAS/Shunts: There is redundancy of the interatrial septum. Evidence of atrial level shunting detected by color flow Doppler. Agitated saline contrast was given intravenously to evaluate for intracardiac shunting. Agitated saline contrast bubble study was  positive with shunting observed within 3-6 cardiac cycles suggestive of interatrial shunt. Additional Comments: There is a large pleural effusion in the left lateral region.  LEFT VENTRICLE PLAX 2D LVIDd:         4.10 cm     Diastology LVIDs:         3.00 cm     LV e' medial:    13.40 cm/s LV PW:         1.00 cm     LV  E/e' medial:  6.5 LV IVS:        0.90 cm     LV e' lateral:   9.00 cm/s LVOT diam:     2.10 cm     LV E/e' lateral: 9.7 LV SV:  68 LV SV Index:   36 LVOT Area:     3.46 cm  LV Volumes (MOD) LV vol d, MOD A2C: 63.8 ml LV vol d, MOD A4C: 58.6 ml LV vol s, MOD A2C: 32.5 ml LV vol s, MOD A4C: 27.6 ml LV SV MOD A2C:     31.3 ml LV SV MOD A4C:     58.6 ml LV SV MOD BP:      32.7 ml RIGHT VENTRICLE RV S prime:     6.85 cm/s TAPSE (M-mode): 1.7 cm LEFT ATRIUM             Index       RIGHT ATRIUM           Index LA diam:        4.00 cm 2.10 cm/m  RA Area:     21.40 cm LA Vol (A2C):   37.5 ml 19.70 ml/m RA Volume:   61.60 ml  32.35 ml/m LA Vol (A4C):   53.1 ml 27.89 ml/m LA Biplane Vol: 44.8 ml 23.53 ml/m  AORTIC VALVE             PULMONIC VALVE LVOT Vmax:   89.40 cm/s  RVOT Peak grad: 2 mmHg LVOT Vmean:  65.500 cm/s LVOT VTI:    0.197 m  AORTA Ao Root diam: 2.70 cm Ao Asc diam:  2.90 cm MITRAL VALVE               TRICUSPID VALVE MV Area (PHT): 3.77 cm    TR Peak grad:   27.0 mmHg MV Decel Time: 201 msec    TR Vmax:        260.00 cm/s MV E velocity: 87.50 cm/s MV A velocity: 90.80 cm/s  SHUNTS MV E/A ratio:  0.96        Systemic VTI:  0.20 m                            Systemic Diam: 2.10 cm                            Pulmonic VTI:  0.087 m Dani Gobble Croitoru MD Electronically signed by Sanda Klein MD Signature Date/Time: 06/25/2020/12:54:37 PM    Final    IR THORACENTESIS ASP PLEURAL SPACE W/IMG GUIDE  Result Date: 06/28/2020 INDICATION: Patient with a history of prostate cancer with extensive metastases now has new onset pleural effusions. Interventional radiology asked to perform a therapeutic and diagnostic thoracentesis. EXAM: ULTRASOUND GUIDED THORACENTESIS MEDICATIONS: 1% lidocaine 10 mL COMPLICATIONS: None immediate. PROCEDURE: An ultrasound guided thoracentesis was thoroughly discussed with the patient and questions answered. The benefits, risks, alternatives and complications were also discussed.  The patient understands and wishes to proceed with the procedure. Written consent was obtained. Ultrasound was performed to localize and mark an adequate pocket of fluid in the left chest. The area was then prepped and draped in the normal sterile fashion. 1% Lidocaine was used for local anesthesia. Under ultrasound guidance a 6 Fr Safe-T-Centesis catheter was introduced. Thoracentesis was performed. The catheter was removed and a dressing applied. FINDINGS: A total of approximately 1.3 L of bright yellow fluid was removed. Samples were sent to the laboratory as requested by the clinical team. IMPRESSION: Successful ultrasound guided left thoracentesis yielding 1.3 L of pleural fluid. Read by: Soyla Dryer, NP Electronically Signed   By: Corrie Mckusick D.O.   On: 06/28/2020  12:11   IR THORACENTESIS ASP PLEURAL SPACE W/IMG GUIDE  Result Date: 06/27/2020 INDICATION: Patient with a history of prostate cancer with extensive metastases now has new onset pleural effusions. Interventional radiology asked to perform a therapeutic and diagnostic thoracentesis. EXAM: ULTRASOUND GUIDED THORACENTESIS MEDICATIONS: 1% lidocaine 20 mL COMPLICATIONS: None immediate. PROCEDURE: An ultrasound guided thoracentesis was thoroughly discussed with the patient and questions answered. The benefits, risks, alternatives and complications were also discussed. The patient understands and wishes to proceed with the procedure. Written consent was obtained. Ultrasound was performed to localize and mark an adequate pocket of fluid in the right chest. The area was then prepped and draped in the normal sterile fashion. 1% Lidocaine was used for local anesthesia. Under ultrasound guidance a 6 Fr Safe-T-Centesis catheter was introduced. Thoracentesis was performed. The catheter was removed and a dressing applied. FINDINGS: A total of approximately 1.6 L of dark yellow fluid was removed. Samples were sent to the laboratory as requested by the  clinical team. IMPRESSION: Successful ultrasound guided right thoracentesis yielding 1.6 L of pleural fluid. Read by: Soyla Dryer, NP Electronically Signed   By: Markus Daft M.D.   On: 06/27/2020 12:29    Labs:  Basic Metabolic Panel: Recent Labs  Lab 07/08/20 0757  NA 139  K 4.3  CL 108  CO2 23  GLUCOSE 133*  BUN 16  CREATININE 0.75  CALCIUM 8.1*    CBC: Recent Labs  Lab 07/08/20 0757  WBC 5.3  NEUTROABS 3.4  HGB 9.7*  HCT 31.8*  MCV 94.4  PLT 326    CBG: No results for input(s): GLUCAP in the last 168 hours.  Family history.  Brother with prostate cancer.  Denies any colon cancer esophageal cancer or rectal cancer  Brief HPI:   Jeffren Dombek is a 64 y.o. 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  Patient with complicated course he was recently hospitalized at [facility] in [place] from 10/17-10/25 for shortness of breath atrial fibrillation did require intubation for a short time.  Attempts were made at chemical cardioversion that failed placed on Eliquis.  Reportedly after patient was extubated he was having some left-sided weakness particularly left upper extremity.  He was frustrated as to the delay in getting an MRI of his brain and decided to leave AMA early morning of 06/24/2020 to return to the [place] area to be closer to his brother who is a retired Oncologist and patient immediately after landing in the [place] area was brought to [facility] for ongoing evaluation.  In the ED he was found to still be in rapid atrial fibrillation.  Chest x-ray showed substantial bilateral  pleural effusions with evidence of diffuse metastatic lesions in the bones.  CT angiogram of the chest negative for pulmonary emboli.  He had markedly elevated D-dimer of 19.33.  MRI of the brain showed no acute infarction however there was some increasing thickening enhancement along the posterior falx cerebri with possible minimal associated parenchymal edema.  Attempts were made at MR cervical spine but patient was unable to tolerate.  Admission chemistries glucose 153 calcium 7.5 alkaline phosphatase 2755 BNP 281 urine culture  no growth digoxin level 1.0 troponin negative hemoglobin 9.  Patient maintained on Lanoxin as well as Cardizem with Eliquis ongoing as well as Toprol.  Echocardiogram with ejection fraction of 50 to 55% no wall motion abnormalities.  Patient did undergo IR ultrasound-guided right thoracentesis on 06/27/2020 with 1.6 L dark yellow fluid removed and left thoracentesis 06/28/2020 with 1.3 L bright yellow fluid removed and cultures continue to show no growth.  Latest chest x-ray 07/01/2020 showed no significant recurrence of left pleural effusion since thoracentesis stable bibasilar atelectasis.  Oncology services Dr. Jana Hakim consulted in regards to prostate cancer remain on Casodex current plans to follow-up outpatient with Dr. [name] as well as palliative care established with plan to repeat MRI of the brain in 2 months per Dr. Mickeal Skinner.  A Foley catheter tube had initially been placed while in [place] for urinary retention planning voiding trial.  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  Therapy evaluations completed and patient was admitted for a comprehensive rehab program   Hospital Course: Remer Couse was admitted to rehab 07/05/2020 for inpatient therapies to consist of PT, ST and OT at least  three hours five days a week. Past admission physiatrist, therapy team and rehab RN have worked together to provide customized collaborative inpatient rehab.  Pertaining to patient's acute respiratory failure decompensated diastolic congestive heart failure bilateral pleural effusion status post thoracentesis.  He continued to attend therapies no increasing shortness of breath.  He remained on Eliquis for new findings of atrial fibrillation initiated while in [place] before leaving AMA.  Noted history of prostate cancer bony metastasis plan was to follow-up outpatient Dr. [name] as well as Dr. Mickeal Skinner to repeat MRI of the brain in 2 months and continue Casodex at this time.  His atrial fibrillation remained controlled his Lanoxin was initially held and resumed 07/12/2020 and 0.125 mg daily.  He will continue Cardizem as well as Toprol.  Acute on chronic anemia hemoglobin 9.7 no bleeding episodes.  He continued on Synthroid for hypothyroidism.  Noted postoperative uropathy secondary to prostate cancer Foley tube in place while in [place] patient was voiding around Foley tube that was removed voiding trial and voiding without difficulty a urology consult ambulatory was made in referral at discharge.   Blood pressures were monitored on TID basis and controlled      Rehab course: During patient's stay in rehab weekly team conferences were held to monitor patient's progress, set goals and discuss barriers to discharge. At admission, patient required minimal guard 120 feet rolling walker minimal assist sit to supine.  Set up upper body bathing minimal assist upper body dressing moderate assist lower body dressing minimal assist toilet transfers  Physical exam.  Blood pressure 128/70 pulse 80 temperature 98 respirations 17 oxygen saturations 92% room air Constitutional.  No acute distress HEENT Head.  Normocephalic and atraumatic Eyes.  Pupils round and reactive to light no discharge without nystagmus Neck.   Supple nontender no JVD without thyromegaly Cardiac regular rate rhythm not extra sounds or murmur heard Chest.  Respiratory no acute distress good inspiratory effort without wheeze Abdomen.  Soft nontender positive bowel sounds without rebound Extremities no clubbing cyanosis or edema Neurologic.  Alert provides his name and age follows commands.  Vonore.  Strength grossly graded 4+/5 throughout except 4 - left upper  extremity  He/  has had improvement in activity tolerance, balance, postural control as well as ability to compensate for deficits. He/ has had improvement in functional use RUE/LUE  and RLE/LLE as well as improvement in awareness.  Working with energy conservation techniques ambulates 100 feet to the day room without assistive device close supervision.  Ongoing monitoring for any orthostasis.  Ambulates in and out of the bathroom without assistive device and supervision.  Patient could gather his belongings for ADLs modified independent but declined therapies at times.  Cardiology services was consulted in regards to patient recent onset of new atrial fibrillation there was heavy concern of fluid overload with follow-up per cardiology services his Cardizem was adjusted he was placed on diuretics.  Due to his fluid overload was recommended patient return back to acute care services for repeat echocardiogram and monitoring.       Disposition: Discharged to acute care services    Diet: Regular  Special Instructions: No driving smoking or alcohol  Medications at discharge 1.  Tylenol as needed 2.  Eliquis 5 mg p.o. twice daily 3.  Casodex 50 mg daily 4.  Lanoxin 0.125 mg daily 5.  Cardizem 120 mg daily 6.  Synthroid 25 mcg p.o. daily 7.  Toprol-XL 100 mg p.o. twice daily  30-35 minutes were spent completing discharge summary and discharge planning  Discharge Instructions     Ambulatory referral to Physical Medicine Rehab   Complete by: As directed     Moderate complexity follow-up 1 month debility related to acute respiratory failure with history of prostate cancer   Ambulatory referral to Urology   Complete by: As directed    Ambulatory referral in regards to urinary retention.  Foley catheter tube currently in place      Allergies as of 07/12/2020   No Known Allergies      Medication List     TAKE these medications    acetaminophen 325 MG tablet Commonly known as: TYLENOL Take 2 tablets (650 mg total) by mouth every 4 (four) hours as needed for headache or mild pain.   apixaban 5 MG Tabs tablet Commonly known as: Eliquis Take 1 tablet (5 mg total) by mouth 2 (two) times daily.   bicalutamide 50 MG tablet Commonly known as: CASODEX Take 1 tablet (50 mg total) by mouth daily.   digoxin 0.125 MG tablet Commonly known as: LANOXIN Take 1 tablet (0.125 mg total) by mouth daily. What changed:  medication strength how much to take   diltiazem 30 MG tablet Commonly known as: CARDIZEM Take 1 tablet (30 mg total) by mouth every 8 (eight) hours.   docusate sodium 100 MG capsule Commonly known as: COLACE Take 100 mg by mouth 2 (two) times daily as needed (constipation).   folic acid 1 MG tablet Commonly known as: FOLVITE Take 1 tablet (1 mg total) by mouth daily.   levothyroxine 25 MCG tablet Commonly known as: SYNTHROID Take 1 tablet (25 mcg total) by mouth daily before breakfast.   metoprolol succinate 100 MG 24 hr tablet Commonly known as: TOPROL-XL Take 1 tablet (100 mg total) by mouth 2 (two) times daily. Take with or immediately following a meal. What changed:  medication strength when to take this   pantoprazole 40 MG tablet Commonly known as: PROTONIX Take 1 tablet (40 mg total) by mouth daily.   senna 8.6 MG Tabs tablet Commonly known as: SENOKOT Take 1 tablet by mouth 2 (two) times daily as needed (constipation).  Follow-up Information     Jamse Arn, MD Follow up.   Specialty:  Physical Medicine and Rehabilitation Why: Office to call for appointment Contact information: [address] [place] [address] [phone]         Ventura Sellers, MD Follow up.   Specialties: Psychiatry, Neurology, Oncology Why: Call for appointment Contact information: [place] [address] [phone]         Wyatt Portela, MD Follow up.   Specialty: Oncology Why: Call for appointment Contact information: [place] [address] [phone]                 Signed: Cathlyn Parsons 07/12/2020, 5:27 AM Patient was seen, face-face, and physical exam performed by me on day of discharge, greater than 30 minutes of total time spent.. Please see progress note from day of discharge as well.  Delice Lesch, MD, ABPMR

## 2020-07-12 NOTE — Progress Notes (Addendum)
Inpatient Rehabilitation Care Coordinator  Discharge Note  The overall goal for the admission was met for  Discharge location: NO-TRANSFERRING BACK TO ACUTE FOR MEDICAL ISSUES  Length of Stay: NO-7 DAYS  Discharge activity level: Yes-MOD/I LEVEL  Home/community participation: Yes  Services provided included: MD, RD, PT, OT, RN, CM, Pharmacy, Neuropsych and SW-EVALUATED BY SP-THEN DC  Financial Services: Other: [place] CLINICAL-CARDIOLOGY CONSULT SENT TO CM JUSTIFYING TRANSFER TO ACUTE  Follow-up services arranged: Outpatient: [facility] -PT & OT WILL CALL PT OR HIS BROTHER TO SET UP APPOINTMENTS  Comments (or additional information):PT DID WELL AND REACHED THE GOALS HE SET FOR HIMSELF-NO ASSISTIVE DEVICE. PLANS TO STAY WITH BROTHER UNTIL HE IS ABLE TO RETURN TO HIS OWN HOME.  Patient/Family verbalized understanding of follow-up arrangements: Yes  SPOKE [place] TO ACUTE  Individual responsible for coordination of the follow-up plan: SELF [phone]-CELL DR. Bosie Clos [phone]  Confirmed correct DME delivered: Elease Hashimoto 07/12/2020    Lashina Milles, Gardiner Rhyme

## 2020-07-12 NOTE — Plan of Care (Signed)
  Problem: Consults Goal: RH GENERAL PATIENT EDUCATION Description: See Patient Education module for education specifics. Outcome: Completed/Met Goal: Nutrition Consult-if indicated Outcome: Completed/Met   Problem: RH BOWEL ELIMINATION Goal: RH STG MANAGE BOWEL WITH ASSISTANCE Description: STG Manage Bowel with Assistance.- min assistance Outcome: Completed/Met Goal: RH STG MANAGE BOWEL W/MEDICATION W/ASSISTANCE Description: STG Manage Bowel with Medication with min Assistance. Outcome: Completed/Met   Problem: RH BLADDER ELIMINATION Goal: RH STG MANAGE BLADDER WITH ASSISTANCE Description: STG Manage Bladder With min Assistance Outcome: Completed/Met Goal: RH STG MANAGE BLADDER WITH EQUIPMENT WITH ASSISTANCE Description: STG Manage Bladder With Equipment With min  Assistance Outcome: Completed/Met   Problem: RH SKIN INTEGRITY Goal: RH STG MAINTAIN SKIN INTEGRITY WITH ASSISTANCE Description: STG Maintain Skin Integrity With min Assistance. Outcome: Completed/Met   Problem: RH SAFETY Goal: RH STG ADHERE TO SAFETY PRECAUTIONS W/ASSISTANCE/DEVICE Description: STG Adhere to Safety Precautions With min Assistance/Device. Outcome: Completed/Met   Problem: RH PAIN MANAGEMENT Goal: RH STG PAIN MANAGED AT OR BELOW PT'S PAIN GOAL Description: At or below level 4 Outcome: Completed/Met   Problem: RH KNOWLEDGE DEFICIT GENERAL Goal: RH STG INCREASE KNOWLEDGE OF SELF CARE AFTER HOSPITALIZATION Description: Patient will be able to manage care and direct others to assist with care needs at discharge using handouts and educational material provided with cues/reminders Outcome: Completed/Met

## 2020-07-12 NOTE — Progress Notes (Signed)
Physical Therapy Session Note  Patient Details  Name: Chris Morales MRN: [id number] Date of Birth: 10-16-1955  Today's Date: 07/12/2020 PT Individual Time: 1055-1205 PT Individual Time Calculation (min): 70 min   Short Term Goals: Week 1:  PT Short Term Goal 1 (Week 1): = LTGS  Skilled Therapeutic Interventions/Progress Updates:    Pt received sitting in recliner and agreeable to therapy session. While sitting, continent of bladder in urinal without assist. Sit<>stands with distant supervision for safety during session but no unsteadiness noted. Gait to sink, no AD, with supervision for safety and performed standing hand hygiene with supervision. Required seated rest break prior to ambulating to therapy gym again due to pt fear of orthostatic hypotension due to hx of this. Gait training ~123ft to main therapy gym, no AD, with close supervision for safety - continues to demo slow gait speed with decreased trunk rotations and arm swing. Pt aware of endurance impairments but adamantly declines addressing that while in CIR despite encouragement stating he feels confident doing that at home and would rather address BUE strength impairments. Supine<>sit on mat table independently - pt returns to sitting between supine exercises due to pt reporting that his airway feels "restricted" when lying down (reports he has been sleeping sitting up in recliner while in CIR due to this) - pt does report endurance deficits even after a few bouts of supine<>sit.  Participated in the following B UE strengthening exercises (2x10 reps each unless otherwise states) with cuing/facilitation for proper form/technique and therapist providing assist as needed:  - supine L UE elbow extension against gravity with pt only able to perform through limited ROM (~20degrees) without assist  - supine L UE scapular retraction/protaction with assist to maintain elbow extended during movement (though demos improvement compared to last time  performed this with this therapist)  - seated L UE external rotation against manual resistance - seated R UE supported on table using arm skate:  - pushing forward/backwards with focus on scapular protraction/retraction, therapist providing facilitation for proper scapular alignment-  - shoulder external rotation with elbow extension with therapist providing slight resistance  - shoulder abduction/adduction - seated with R elbow supported on arm rest to provide stability of humerus performed bicep curls with 2lb weight - seated with R arm supported on table performed forearm pronation/supination against 1lb weight Repeated cuing throughout exercises for improved upright/trunk posture to improve shoulder alignment.  Gait ~173ft back to room, no AD, with supervision for safety - continues with above gait impairments. Pt left seated in recliner, B LEs elevated, needs in reach and chair alarm on.  Therapy Documentation Precautions:  Precautions Precautions: Other (comment) Precaution Comments: Edema in BLE, RTC injury R shoulder Restrictions Weight Bearing Restrictions: No  Pain: Denies pain during session.  Therapy/Group: Individual Therapy  Tawana Scale , PT, DPT, CSRS  07/12/2020, 10:58 AM

## 2020-07-12 NOTE — Progress Notes (Signed)
Pt's brother Dr Chrissie Noa called to check on pt's night and status. Permission was given by pt to given information. Pts brother concerned that pt has been appearing to gain weight quickly and that pt is having 4+pitting edema.

## 2020-07-12 NOTE — Progress Notes (Signed)
Physical Therapy Session Note  Patient Details  Name: Chris Morales MRN: [id number] Date of Birth: 11/14/1955  Today's Date: 07/12/2020 PT Individual Time: 1330-1441 PT Individual Time Calculation (min): 71 min   Short Term Goals: Week 1:  PT Short Term Goal 1 (Week 1): = LTGS  Skilled Therapeutic Interventions/Progress Updates:   Received pt sitting in recliner, pt agreeable to therapy, and denied any pain during session. Noted catheter removed and pt able to use urinal independently x 2 trials during session. Session with emphasis on functional mobility/transfers, generalized strengthening, dynamic standing balance/coordination, ambulation, stair navigation, simulated car transfers, and improved activity tolerance. Pt ambulated >127ft without AD and close supervision to ortho gym and required seated rest break prior to performing simulated car transfer with supervision without AD. Pt ambulated 50ft on uneven surfaces (ramp and mulch) and navigated 1 curb with supervision without AD. Cardiology present during session to assess pt and reported noted increased fluid in R side of heart and ordered chest x-ray. Pt ambulated additional 31ft without AD and supervision and navigated 12 steps with 2 rails and supervision ascending and descending with a step through pattern with 1 minor LOB but pt able to self-correct. Pt required multiple extended rest breaks throughout session due to fatigue, SOB, and decreased activity tolerance. Pt ambulated 5ft without AD and supervision and radiology present to perform chest x-ray during session. Pt voided in urinal and ambulated 38ft without AD and supervision to wash hands at sink with supervision. Therapist suggested supine shoulder exercises but pt requested seated exercises due to difficulty breathing when lying down. Pt performed the following exercises sitting in recliner with supervision and verbal cues for technique: -shoulder flexion x10 bilaterally (decreased  ROM bilaterally R>L) -shoulder ER x10 bilaterally -shoulder IR x10 bilaterally -shoulder abduction x10 bilaterally -bicep curls with grn TB x10 bilaterally (pt only able to achieve ~60 degrees RUE flexion) Attempted horizontal shoulder abuction with grn TB however pt compensating and switched to single arm RUE resisted ER with grn TB x10. Noted impaired grip strength LUE>RUE and therapist encouraged pt to use putty. Pt used tan putty for 1 minute in LUE with emphasis on grip strength. PA, Linna Hoff, present twice during session stating that pt will be transferring to acute later on this afternoon due to increased fluid retention in BLEs. Concluded session with pt sitting in recliner, needs within reach, and chair pad alarm on.   Therapy Documentation Precautions:  Precautions Precautions: Other (comment) Precaution Comments: Edema in BLE, RTC injury R shoulder Restrictions Weight Bearing Restrictions: No   Therapy/Group: Individual Therapy Alfonse Alpers PT, DPT   07/12/2020, 7:28 AM

## 2020-07-12 NOTE — Progress Notes (Signed)
Patient ID: Emersyn Kotarski, male   DOB: November 23, 1955, 64 y.o.   MRN: [id number] Spoke with pt's brother-Dr. Chrissie Noa who has concerned regarding pt's plus 4 pitting edema and his a-fib and need to see a Cardiologist prior to discharging Sunday, if not addressed he feels he will come right back to the hospital. Have text MD to call brother. Brother's other concern is switching his insurance so follow up and MD's appointments are covered. Encouraged him to reach out to pt's current insurance in [place] to see what options he has since he will be staying down here for further treatment and follow up. Discussed OP therapy and he is agreeable for pt but would need to be negotiated with insurance since all is out of network here.

## 2020-07-12 NOTE — Consult Note (Addendum)
Cardiology Consultation:   Patient ID: Chris Morales MRN: [id number]; DOB: 16-Feb-1956  Admit date: 07/05/2020 Date of Consult: 07/12/2020  Primary Care Provider: Patient, No Pcp Per [facility] Cardiologist: [place]  Patient Profile:   Chris Morales is a 64 y.o. male with a hx of prostate cancer with bone metastasis, permanent atrial fibrillation who is being seen today for the evaluation of atrial fibrillation at the request of Dr. Posey Pronto.   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  History of Present Illness:   Chris Morales is admitted mid [facility] 06/16/20-06/24/20 for respiratory failure requiring intubation & vent support. Had afib RVR with failed cardioversion. Tx with amiodarone and digoxin. Post intubation he had left side weakness but there was delay in getting  MRI and Left AMA.   He took flight and came to [place] (his retired OB/GYN brother lives here) and came to ER. Admitted for acute hypoxic respiratory failure and acute dCHF.  CT chest angios showed bilateral effusions and extensive bony metastatic disease. Brain MRI 10/26/210 showed possible dural based metastasis.  MRI C-spine shows diffuse osseous metastasis. Seen by oncology and started on Casodex and Lupron. Underwent bilateral thoracentesis.  He underwent transfusion for anemia.  His left-sided upper extremity weakness improved.  Further evaluation based on prognosis.  He was discharged to inpatient rehab on 07/05/20.  He remains on Eliquis for anticoagulation.  His digoxin resume today. HR 60-70s.  Elevated alkaline phosphatase.  Albumin 2.6.  Hemoglobin 9.7.  His last lab was on 07/08/2020.  Patient reported shortness of breath without chest discomfort during physical therapy.  Denies palpitation.  Due to orthopnea he sleeps on recliner.  Has lower extremity edema.  He initially diagnosed with atrial fibrillation in March of this year.  He was seen by cardiologist at [facility] but did not anticoagulated because  finding of anemia.  He was started on anticoagulation when admitted to her [facility].   Past Medical History:  Diagnosis Date  . Hypothyroidism   . Prostate CA [facility])     Past Surgical History:  Procedure Laterality Date  . IR THORACENTESIS ASP PLEURAL SPACE W/IMG GUIDE  06/27/2020  . IR THORACENTESIS ASP PLEURAL SPACE W/IMG GUIDE  06/28/2020  . LAMINECTOMY Bilateral 02/20/2019   Procedure: CERVICAL LAMINECTOMY FOR TUMOR;  Surgeon: Earnie Larsson, MD;  Location: [place];  Service: Neurosurgery;  Laterality: Bilateral;  CERVICAL LAMINECTOMY FOR TUMOR  . LIGAMENT REPAIR Left      Inpatient Medications: Scheduled Meds: . apixaban  5 mg Oral BID  . bicalutamide  50 mg Oral Daily  . digoxin  0.125 mg Oral Daily  . diltiazem  120 mg Oral Daily  . furosemide  40 mg Oral Daily  . levothyroxine  25 mcg Oral QAC breakfast  . metoprolol succinate  100 mg Oral BID   Continuous Infusions:  PRN Meds: acetaminophen, polyethylene glycol  Allergies:   No Known Allergies  Social History:   Social History   Socioeconomic History  . Marital status: Divorced    Spouse name: Not on file  . Number of children: Not on file  . Years of education: Not on file  . Highest education level: Not on file  Occupational History  . Not on file  Tobacco Use  . Smoking status: Never Smoker  . Smokeless tobacco: Never Used  Vaping Use  . Vaping Use: Never used  Substance and Sexual Activity  . Alcohol use: Yes  . Drug use: No  .  Sexual activity: Not Currently  Other Topics Concern  . Not on file  Social History Narrative  . Not on file   Social Determinants of Health   Financial Resource Strain:   . Difficulty of Paying Living Expenses: Not on file  Food Insecurity:   . Worried About Charity fundraiser in the Last Year: Not on file  . Ran Out of Food in the Last Year: Not on file  Transportation Needs:   . Lack of Transportation (Medical): Not on file  . Lack of Transportation  (Non-Medical): Not on file  Physical Activity:   . Days of Exercise per Week: Not on file  . Minutes of Exercise per Session: Not on file  Stress:   . Feeling of Stress : Not on file  Social Connections:   . Frequency of Communication with Friends and Family: Not on file  . Frequency of Social Gatherings with Friends and Family: Not on file  . Attends Religious Services: Not on file  . Active Member of Clubs or Organizations: Not on file  . Attends Archivist Meetings: Not on file  . Marital Status: Not on file  Intimate Partner Violence:   . Fear of Current or Ex-Partner: Not on file  . Emotionally Abused: Not on file  . Physically Abused: Not on file  . Sexually Abused: Not on file    Family History:   Family History  Problem Relation Age of Onset  . Prostate cancer Brother      ROS:  Please see the history of present illness.  All other ROS reviewed and negative.     Physical Exam/Data:   Vitals:   07/11/20 1920 07/11/20 2143 07/12/20 0340 07/12/20 0748  BP: 102/66 119/70 (!) 98/59 116/65  Pulse: 72  (!) 59 68  Resp: 17  18   Temp: 98.1 F (36.7 C)  97.7 F (36.5 C)   TempSrc: Oral     SpO2: 99%  100%   Weight:      Height:        Intake/Output Summary (Last 24 hours) at 07/12/2020 1349 Last data filed at 07/12/2020 0800 Gross per 24 hour  Intake 480 ml  Output 1275 ml  Net -795 ml   Last 3 Weights 07/06/2020 07/05/2020 07/05/2020  Weight (lbs) 158 lb 15.2 oz 162 lb 11.2 oz 155 lb 9.6 oz  Weight (kg) 72.1 kg 73.8 kg 70.58 kg     Body mass index is 20.97 kg/m.  General:  Well nourished, well developed, in no acute distress HEENT: normal Lymph: no adenopathy Neck: + JVD Endocrine:  No thryomegaly Vascular: No carotid bruits; FA pulses 2+ bilaterally without bruits  Cardiac:  normal S1, S2; RRR; no murmur Lungs: Diminished breath sound Abd: soft, nontender, no hepatomegaly  Ext: 2+ bilateral lower extremity edema with compression  stocking Musculoskeletal:  No deformities, BUE and BLE strength normal and equal Skin: warm and dry  Neuro:  CNs 2-12 intact, no focal abnormalities noted Psych:  Normal affect   EKG:  The EKG 06/24/20 was personally reviewed and demonstrates: Atrial fibrillation at rate of 130 bpm  Telemetry:  Telemetry was personally reviewed and demonstrates: Not on telemetry  Relevant CV Studies:  Echo 06/25/20 1. Left ventricular ejection fraction, by estimation, is 50 to 55%. The  left ventricle has low normal function. The left ventricle has no regional  wall motion abnormalities. Left ventricular diastolic parameters were  normal. There is the  interventricular septum is flattened  in diastole ('D' shaped left  ventricle), consistent with right ventricular volume overload.  2. Right ventricular systolic function is mildly reduced. The right  ventricular size is mildly enlarged. There is mildly elevated pulmonary  artery systolic pressure. The estimated right ventricular systolic  pressure is 17.0 mmHg.  3. Left atrial size was mildly dilated.  4. Right atrial size was mildly dilated.  5. Large pleural effusion in the left lateral region.  6. The mitral valve is normal in structure. Mild mitral valve  regurgitation.  7. Tricuspid valve regurgitation is mild to moderate.  8. The aortic valve is normal in structure. Aortic valve regurgitation is  not visualized. Mild aortic valve sclerosis is present, with no evidence  of aortic valve stenosis.  9. The inferior vena cava is dilated in size with <50% respiratory  variability, suggesting right atrial pressure of 15 mmHg.  10. Evidence of atrial level shunting detected by color flow Doppler.  Agitated saline contrast bubble study was positive with shunting observed  within 3-6 cardiac cycles suggestive of interatrial shunt.   Laboratory Data:  High Sensitivity Troponin:   Recent Labs  Lab 06/25/20 1546  TROPONINIHS 15      Chemistry Recent Labs  Lab 07/08/20 0757  NA 139  K 4.3  CL 108  CO2 23  GLUCOSE 133*  BUN 16  CREATININE 0.75  CALCIUM 8.1*  GFRNONAA >60  ANIONGAP 8    Recent Labs  Lab 07/08/20 0757  PROT 5.8*  ALBUMIN 2.6*  AST 18  ALT 21  ALKPHOS 2,559*  BILITOT 0.8   Hematology Recent Labs  Lab 07/08/20 0757  WBC 5.3  RBC 3.37*  HGB 9.7*  HCT 31.8*  MCV 94.4  MCH 28.8  MCHC 30.5  RDW 22.5*  PLT 326    Radiology/Studies:  DG Shoulder Right  Result Date: 07/08/2020 CLINICAL DATA:  Shoulder weakness, bone metastasis, torn rotator cuff EXAM: RIGHT SHOULDER - 2+ VIEW COMPARISON:  Radiograph 02/20/2014 FINDINGS: A diffusely mottled sclerotic appearance of the bones is compatible with known osseous metastatic disease. There is a high-riding appearance of the humeral head which is suggestive of underlying rotator cuff insufficiency particularly in the setting of known rotator cuff tear. Moderate arthrosis of the glenohumeral and acromioclavicular joints. No acute traumatic osseous injury is present at this time. IMPRESSION: 1. Diffusely mottled sclerotic appearance of the bones compatible with known osseous metastatic disease. 2. High-riding appearance of the humeral head is suggestive of underlying rotator cuff insufficiency, particularly in the setting of known rotator cuff tear. 3. No acute fracture or traumatic malalignment. Electronically Signed   By: Lovena Le M.D.   On: 07/08/2020 19:23    Assessment and Plan:    1.  Permanent atrial fibrillation -Initially diagnosed March 2021. -Seems rate controlled and could be sinus rhythm by exam currently -Consolidate short acting cardizem to Cardizem CD 120 mg -On Eliquis for anticoagulation and on digoxin CHA2DS2-VASc Score = 0  This indicates a 0.2% annual risk of stroke. The patient's score is based upon: CHF History: 0 HTN History: 0 Diabetes History: 0 Stroke History: 0 Vascular Disease History: 0 Age Score:  0 Gender Score: 0   2.  Shortness of breath -Seems multifactorial secondary to metastatic cancer, pleural effusion, anemia and deconditioning -Echocardiogram earlier this admission showed LV function of 50 to 01%, normal diastolic dysfunction and mildly reduced RV function with mild pulmonary hypertension -No PE on CT angio of chest on admit -He could have underlying pulmonary hypertension>>  plan to repeat limited echocardiogram -He has lower extremity edema in setting of hypoalbuminemia>> increase protein in his diet -He is volume overloaded on exam >>  Continue Lasix. Likely increase. Will review with MD.  - Get CMP, BNP and CBC  3.  Metastatic prostate cancer - On  Casodex and Lupron  4. Bilateral pleural effusion - S/p bilateral thoracentesis with likely exudative - Repeat chest x-ray   For questions or updates, please contact [place] Please consult [email or web address] for contact info under    Jarrett Soho, PA  07/12/2020 1:49 PM

## 2020-07-12 NOTE — Progress Notes (Addendum)
Brief Note:  Discussed with Cards attending, plan to discharge to acute for further workup. Patient states he will discuss with his brother.  > 30 minutes spent in total in discharge planning between myself and PA regarding aforementioned, as well discussion regarding DME equipment, follow-up appointments, follow-up therapies, discharge medications, discussion with Cards and transplant physician- these things are subject to change based on cardiology workup.

## 2020-07-12 NOTE — Progress Notes (Addendum)
Chris Morales PHYSICAL MEDICINE & REHABILITATION PROGRESS NOTE   Subjective/Complaints: Patient seen laying in bed this AM.  He states he slept well overnight. He has questions regarding edema.  Brother has questions as well.  He responded to Lasix yesterday and is voiding without catheter.  ROS: Denies CP, SOB, N/V/D  Objective:   No results found. No results for input(s): WBC, HGB, HCT, PLT in the last 72 hours. No results for input(s): NA, K, CL, CO2, GLUCOSE, BUN, CREATININE, CALCIUM in the last 72 hours.  Intake/Output Summary (Last 24 hours) at 07/12/2020 1107 Last data filed at 07/12/2020 0800 Gross per 24 hour  Intake 720 ml  Output 1375 ml  Net -655 ml        Physical Exam: Vital Signs Blood pressure 116/65, pulse 68, temperature 97.7 F (36.5 C), resp. rate 18, height 6\' 1"  (1.854 m), weight 72.1 kg, SpO2 100 %.  Constitutional: No distress . Vital signs reviewed. HENT: Normocephalic.  Atraumatic. Eyes: EOMI. No discharge. Cardiovascular: Irregularly irregular.  Respiratory: Normal effort.  No stridor.  Bilateral clear to auscultation. GI: Non-distended.  BS +. GU: +Foley.  Scrotal edema, improving Skin: Warm and dry.  Vasc changes to b/l LE. Psych: Normal mood.  Normal behavior. Musc: B/l LE edema, some improvement. No tenderness in extremities. Neuro: Alert LUE extremities: 4/5 proximal distal RUE: Shoulder abduction: 2-/5, elbow flexion/extention 4/5, hand grip 4/5 Bilateral lower extremities: Hip flexion, knee extension 4/5, dorsiflexion 4+/5, stable  Assessment/Plan: 1. Functional deficits secondary to debility after CHF exacerbation which require 3+ hours per day of interdisciplinary therapy in a comprehensive inpatient rehab setting.  Physiatrist is providing close team supervision and 24 hour management of active medical problems listed below.  Physiatrist and rehab team continue to assess barriers to discharge/monitor patient progress toward  functional and medical goals  Care Tool:  Bathing    Body parts bathed by patient: Right arm, Left arm, Chest, Abdomen, Front perineal area, Buttocks, Right upper leg, Left upper leg, Left lower leg, Face, Right lower leg         Bathing assist Assist Level: Supervision/Verbal cueing     Upper Body Dressing/Undressing Upper body dressing   What is the patient wearing?: Pull over shirt    Upper body assist Assist Level: Minimal Assistance - Patient > 75%    Lower Body Dressing/Undressing Lower body dressing      What is the patient wearing?: Pants     Lower body assist Assist for lower body dressing: Contact Guard/Touching assist     Toileting Toileting    Toileting assist Assist for toileting: Supervision/Verbal cueing     Transfers Chair/bed transfer  Transfers assist     Chair/bed transfer assist level: Supervision/Verbal cueing     Locomotion Ambulation   Ambulation assist      Assist level: Supervision/Verbal cueing Assistive device: No Device Max distance: 136ft   Walk 10 feet activity   Assist     Assist level: Supervision/Verbal cueing Assistive device: No Device   Walk 50 feet activity   Assist    Assist level: Supervision/Verbal cueing Assistive device: No Device    Walk 150 feet activity   Assist    Assist level: Supervision/Verbal cueing Assistive device: No Device    Walk 10 feet on uneven surface  activity   Assist     Assist level: Contact Guard/Touching assist Assistive device: Other (comment) (no device)   Wheelchair     Assist Will patient use wheelchair at discharge?:  No             Wheelchair 50 feet with 2 turns activity    Assist            Wheelchair 150 feet activity     Assist          Blood pressure 116/65, pulse 68, temperature 97.7 F (36.5 C), resp. rate 18, height 6\' 1"  (1.854 m), weight 72.1 kg, SpO2 100 %.    Medical Problem List and Plan: 1.  Decreased  functional mobility secondary to acute respiratory failure decompensated diastolic congestive heart failure bilateral pleural effusions status post thoracentesis  Continue CIR  Discussed edema with patient's brother again, insisting on Cardiology consult inpatient.  Plan for d/c on Sunday  Will see patient for hospital follow up in 1 month post-discharge 2.  Antithrombotics: -DVT/anticoagulation: Eliquis mg BID             -antiplatelet therapy: N/A 3. Pain Management: Tylenol as needed.   Controlled on 11/12 4. Mood: Provide emotional support             -antipsychotic agents: N/A 5. Neuropsych: This patient is capable of making decisions on his own behalf.  6. Skin/Wound Care: Routine skin checks 7. Fluids/Electrolytes/Nutrition: Routine in and outs. 8.  Prostate cancer with bony metastasis.  Plans to follow-up outpatient Dr. [name] as well as Dr. Mickeal Skinner and plan to repeat MRI of the brain 2 months.  Continue Casodex 50 mg daily  Right shoulder x-ray personally reviewed and discussed with patient and therapies-with showing sequela of bony mets as well as chronic rotator cuff tear sequela -discussed with patient and therapies 9.  Atrial fibrillation with RVR.    Lanoxin 0.25 mg daily on hold, resumed on 11/12 at 0.125 given hypotension/bradycardia -discussed with pharmacy   Dig level within normal limits on 11/9  Cardizem 30 mg every 8 hours  Toprol-XL 100 mg twice daily.   HR relatively controlled on 11/12  Cardiology consulted 10. Acute on chronic anemia.   Hemoglobin 9.7 on 11/8  Continue to monitor 11. Hypothyroidism.  Synthroid 50mcg daily.  Latest TSH 7.345.  Synthroid was recently initiated while in [place] earlier this month.  Plan to repeat TFTs 4-6 weeks. 12. 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      Malfunctioning foley d/ced on 11/11, patient voiding. Discussed with Urology - follow up as outpatient  PVRs ordered 13. S/p cervical laminectomy with tumor  removal in June 2020. 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   14. LE edema  Blood pressure stable after Lasix 20 x1   Will order lasix daily x2 days and monitor BP - discussed with patient.  Cardiology consulted  LOS: 7 days A FACE TO FACE EVALUATION WAS PERFORMED  Chris Morales Lorie Phenix 07/12/2020, 11:07 AM

## 2020-07-12 NOTE — Discharge Instructions (Signed)
Inpatient Rehab Discharge Instructions  Chris Morales Discharge date and time: No discharge date for patient encounter.   Activities/Precautions/ Functional Status: Activity: activity as tolerated Diet: regular diet Wound Care: Routine skin checks Functional status:  ___ No restrictions     ___ Walk up steps independently ___ 24/7 supervision/assistance   ___ Walk up steps with assistance ___ Intermittent supervision/assistance  ___ Bathe/dress independently ___ Walk with walker     _x__ Bathe/dress with assistance ___ Walk Independently    ___ Shower independently ___ Walk with assistance    ___ Shower with assistance ___ No alcohol     ___ Return to work/school ________  Special Instructions: No driving smoking or alcohol    COMMUNITY REFERRALS UPON DISCHARGE:    Outpatient: PT & OT             Agency:[facility] [place] Phone: [phone]             Appointment Date/Time: WILL CONTACT PATIENT OR HIS BROTHER TO SET UP APPOINTMENTS  Medical Equipment/Items Ordered: NONE NEEDED                                                     My questions have been answered and I understand these instructions. I will adhere to these goals and the provided educational materials after my discharge from the hospital.  Patient/Caregiver Signature _______________________________ Date __________  Clinician Signature _______________________________________ Date __________  Please bring this form and your medication list with you to all your follow-up doctor's appointments.

## 2020-07-12 NOTE — H&P (Signed)
History and Physical    [name] CXK:[id number] DOB: 1955/12/06 DOA: 07/12/2020  PCP: Patient, No Pcp Per Patient coming from: inpatient rehab  Chief Complaint: SOB, legs edema and weight gain  HPI: Chris Morales is a 64 y.o. male with medical history significant of atrial fibrillation, metastatic prostate cancer, hypothyroidism who was admitted from inpatient rehab for  SOB, legs edema and weight gain.   Patient was recently admitted between 10/25 and 07/05/2020 for A. fib with RVR, acute hypoxic respiratory failure, bilateral pleural effusions, and metastatic prostate cancer.  Patient was discharged to inpatient rehab on 07/05/2020.  Patient reports that he has had intermittent SOB/DOE since his discharge.  Over last 3-4 days, his symptoms worsened with associated orthopnea and PND and progressive bilateral legs edema. He told me that his base weight was around 130's lbs prior to his hospital admission, and his current weight was about 160's lbs.  Cardiologist had seen patient today and recommended readmission to hospital for IV diuresis and further workup. Denies fevers, chills, cough, wheezing, chest pain, chest pressure, nausea, vomiting, diarrhea, abdominal pain, dysuria or urinary frequency or urgency.   Review of Systems: As per HPI otherwise 10 point review of systems negative.  Review of Systems Otherwise negative except as per HPI, including: General: Denies fever, chills, night sweats or unintended weight loss. Resp: Denies cough, wheezing.  Positive for shortness of breath, DOE, orthopnea, PND, bilateral legs edema and weight gain Cardiac: Denies chest pain, palpitations, orthopnea, paroxysmal nocturnal dyspnea. GI: Denies abdominal pain, nausea, vomiting, diarrhea or constipation GU: Denies dysuria, frequency, hesitancy or incontinence MS: Denies muscle aches, joint pain or swelling Neuro: Denies headache, neurologic deficits (focal weakness, numbness, tingling), abnormal  gait Psych: Denies anxiety, depression, SI/HI/AVH Skin: Denies new rashes or lesions ID: Denies sick contacts, exotic exposures, travel  Past Medical History:  Diagnosis Date  . Hypothyroidism   . Prostate CA [facility])     Past Surgical History:  Procedure Laterality Date  . IR THORACENTESIS ASP PLEURAL SPACE W/IMG GUIDE  06/27/2020  . IR THORACENTESIS ASP PLEURAL SPACE W/IMG GUIDE  06/28/2020  . LAMINECTOMY Bilateral 02/20/2019   Procedure: CERVICAL LAMINECTOMY FOR TUMOR;  Surgeon: Earnie Larsson, MD;  Location: [facility];  Service: Neurosurgery;  Laterality: Bilateral;  CERVICAL LAMINECTOMY FOR TUMOR  . LIGAMENT REPAIR Left     SOCIAL HISTORY:  reports that he has never smoked. He has never used smokeless tobacco. He reports current alcohol use. He reports that he does not use drugs.  No Known Allergies  FAMILY HISTORY: Family History  Problem Relation Age of Onset  . Prostate cancer Brother      Prior to Admission medications   Medication Sig Start Date End Date Taking? Authorizing Provider  acetaminophen (TYLENOL) 325 MG tablet Take 2 tablets (650 mg total) by mouth every 4 (four) hours as needed for headache or mild pain. 07/11/20   Angiulli, Lavon Paganini, PA-C  apixaban (ELIQUIS) 5 MG TABS tablet Take 1 tablet (5 mg total) by mouth 2 (two) times daily. 07/11/20   Angiulli, Lavon Paganini, PA-C  bicalutamide (CASODEX) 50 MG tablet Take 1 tablet (50 mg total) by mouth daily. 07/11/20   Angiulli, Lavon Paganini, PA-C  digoxin (LANOXIN) 0.125 MG tablet Take 1 tablet (0.125 mg total) by mouth daily. 07/12/20   Angiulli, Lavon Paganini, PA-C  diltiazem (CARDIZEM CD) 120 MG 24 hr capsule Take 1 capsule (120 mg total) by mouth daily. 07/12/20   Angiulli, Lavon Paganini, PA-C  diltiazem (CARDIZEM) 30  MG tablet Take 1 tablet (30 mg total) by mouth every 8 (eight) hours. 07/11/20   Angiulli, Lavon Paganini, PA-C  docusate sodium (COLACE) 100 MG capsule Take 100 mg by mouth 2 (two) times daily as needed (constipation).     [provider]  folic acid (FOLVITE) 1 MG tablet Take 1 tablet (1 mg total) by mouth daily. 07/11/20   Angiulli, Lavon Paganini, PA-C  levothyroxine (SYNTHROID) 25 MCG tablet Take 1 tablet (25 mcg total) by mouth daily before breakfast. 07/11/20   Angiulli, Lavon Paganini, PA-C  metoprolol succinate (TOPROL-XL) 100 MG 24 hr tablet Take 1 tablet (100 mg total) by mouth 2 (two) times daily. Take with or immediately following a meal. 07/11/20   Angiulli, Lavon Paganini, PA-C  pantoprazole (PROTONIX) 40 MG tablet Take 1 tablet (40 mg total) by mouth daily. 07/11/20   Angiulli, Lavon Paganini, PA-C  senna (SENOKOT) 8.6 MG TABS tablet Take 1 tablet by mouth 2 (two) times daily as needed (constipation).    [provider]    Physical Exam: Vitals:   07/12/20 1721  Weight: 75.3 kg  Height: 6\' 1"  (1.854 m)      Constitutional: NAD, calm, comfortable.  Acute ill-appearing.  Chronic ill-appearing. Eyes: PERRL, lids and conjunctivae normal ENMT: Mucous membranes are moist. Posterior pharynx clear of any exudate or lesions.Normal dentition.  Neck: normal, supple, no masses, no thyromegaly Respiratory: Bibasilar lung sounds diminished.  No rhonchi.  No wheezing, no crackles. Normal respiratory effort. No accessory muscle use.  Cardiovascular: Regular rate and rhythm, no murmurs / rubs / gallops. + JVD  Bilateral lower extremity 3+ pitting edema up to mid thighs.. 2+ pedal pulses. No carotid bruits.  Abdomen: no tenderness, no masses palpated. No hepatosplenomegaly. Bowel sounds positive.  Musculoskeletal: no clubbing / cyanosis. No joint deformity upper and lower extremities. Good ROM, no contractures. Normal muscle tone.  Skin: no rashes, lesions, ulcers. No induration Neurologic: CN 2-12 grossly intact. Sensation intact, DTR normal. Strength 5/5 in all 4.  Psychiatric: Normal judgment and insight. Alert and oriented x 3. Normal mood.     Labs on Admission: I have personally reviewed following labs and  imaging studies  CBC: Recent Labs  Lab 07/08/20 0757 07/12/20 1512  WBC 5.3 5.6  NEUTROABS 3.4  --   HGB 9.7* 9.1*  HCT 31.8* 30.9*  MCV 94.4 98.7  PLT 326 505   Basic Metabolic Panel: Recent Labs  Lab 07/08/20 0757 07/12/20 1512  Durell Lofaso 139 139  K 4.3 4.3  CL 108 106  CO2 23 21*  GLUCOSE 133* 103*  BUN 16 19  CREATININE 0.75 0.87  CALCIUM 8.1* 8.1*   GFR: Estimated Creatinine Clearance: 91.4 mL/min (by C-G formula based on SCr of 0.87 mg/dL). Liver Function Tests: Recent Labs  Lab 07/08/20 0757 07/12/20 1512  AST 18 17  ALT 21 17  ALKPHOS 2,559* 2,412*  BILITOT 0.8 0.9  PROT 5.8* 5.8*  ALBUMIN 2.6* 2.9*   No results for input(s): LIPASE, AMYLASE in the last 168 hours. No results for input(s): AMMONIA in the last 168 hours. Coagulation Profile: No results for input(s): INR, PROTIME in the last 168 hours. Cardiac Enzymes: No results for input(s): CKTOTAL, CKMB, CKMBINDEX, TROPONINI in the last 168 hours. BNP (last 3 results) No results for input(s): PROBNP in the last 8760 hours. HbA1C: No results for input(s): HGBA1C in the last 72 hours. CBG: No results for input(s): GLUCAP in the last 168 hours. Lipid Profile: No results for input(s):  CHOL, HDL, LDLCALC, TRIG, CHOLHDL, LDLDIRECT in the last 72 hours. Thyroid Function Tests: No results for input(s): TSH, T4TOTAL, FREET4, T3FREE, THYROIDAB in the last 72 hours. Anemia Panel: No results for input(s): VITAMINB12, FOLATE, FERRITIN, TIBC, IRON, RETICCTPCT in the last 72 hours. Urine analysis:    Component Value Date/Time   COLORURINE AMBER (A) 06/24/2020 2058   APPEARANCEUR HAZY (A) 06/24/2020 2058   LABSPEC 1.026 06/24/2020 2058   PHURINE 5.0 06/24/2020 2058   GLUCOSEU NEGATIVE 06/24/2020 2058   HGBUR SMALL (A) 06/24/2020 2058   [place] NEGATIVE 06/24/2020 2058   [place] NEGATIVE 06/24/2020 2058   PROTEINUR 30 (A) 06/24/2020 2058   NITRITE NEGATIVE 06/24/2020 2058   LEUKOCYTESUR NEGATIVE  06/24/2020 2058   Sepsis Labs: !!!!!!!!!!!!!!!!!!!!!!!!!!!!!!!!!!!!!!!!!!!! @LABRCNTIP (procalcitonin:4,lacticidven:4) )No results found for this or any previous visit (from the past 240 hour(s)).   Radiological Exams on Admission: DG Chest 1 View  Result Date: 07/12/2020 CLINICAL DATA:  Shortness of breath EXAM: CHEST  1 VIEW COMPARISON:  July 01, 2020.  History of prostate carcinoma FINDINGS: There is airspace consolidation in the left base region. Lungs elsewhere clear. Heart is upper normal in size with pulmonary vascularity normal. No adenopathy. Sclerotic bony metastases noted throughout the thoracic region. IMPRESSION: Airspace opacity left base consistent with pneumonia. Lungs elsewhere clear. Heart upper normal in size. Widespread bony metastases noted throughout the thoracic region. Electronically Signed   By: Lowella Grip III M.D.   On: 07/12/2020 14:24   ECHOCARDIOGRAM LIMITED  Result Date: 07/12/2020    ECHOCARDIOGRAM LIMITED REPORT   Patient Name:   ERSEL ENSLIN Date of Exam: 07/12/2020 Medical Rec #:  [id number]    Height:       73.0 in Accession #:    [id number]   Weight:       159.0 lb Date of Birth:  [birth year]    BSA:          1.951 m Patient Age:    38 years     BP:           116/65 mmHg Patient Gender: M            HR:           87 bpm. Exam Location:  Inpatient Procedure: Limited Echo, Limited Color Doppler and Cardiac Doppler Indications:     atrial fibrillation  History:         Patient has no prior history of Echocardiogram examinations.  Sonographer:     Johny Chess Referring Phys:  [id number] Leanor Kail Diagnosing Phys: Loralie Champagne MD IMPRESSIONS  1. Left ventricular ejection fraction, by estimation, is 60 to 65%. The left ventricle has normal function. There is mild left ventricular hypertrophy. Left ventricular diastolic parameters are indeterminate.  2. Right ventricular systolic function is normal. The right ventricular size is mildly enlarged. There  is mildly elevated pulmonary artery systolic pressure. The estimated right ventricular systolic pressure is 69.6 mmHg. Mildly D-shaped interventricular septum suggests elevated RV pressure.  3. Left atrial size was moderately dilated.  4. Right atrial size was moderately dilated.  5. The mitral valve is normal in structure. Trivial mitral valve regurgitation. No evidence of mitral stenosis.  6. The aortic valve is tricuspid. Aortic valve regurgitation is not visualized. Mild aortic valve sclerosis is present, with no evidence of aortic valve stenosis.  7. The inferior vena cava is dilated in size with <50% respiratory variability, suggesting right atrial pressure of 15 mmHg.  8. Significant left pleural effusion. FINDINGS  Left Ventricle: Left ventricular ejection fraction, by estimation, is 60 to 65%. The left ventricle has normal function. The left ventricular internal cavity size was normal in size. There is mild left ventricular hypertrophy. Left ventricular diastolic  parameters are indeterminate. Right Ventricle: The right ventricular size is mildly enlarged. No increase in right ventricular wall thickness. Right ventricular systolic function is normal. There is mildly elevated pulmonary artery systolic pressure. The tricuspid regurgitant velocity is 2.55 m/s, and with an assumed right atrial pressure of 15 mmHg, the estimated right ventricular systolic pressure is 87.5 mmHg. Left Atrium: Left atrial size was moderately dilated. Right Atrium: Right atrial size was moderately dilated. Pericardium: Significant left pleural effusion. Trivial pericardial effusion is present. Mitral Valve: The mitral valve is normal in structure. Trivial mitral valve regurgitation. No evidence of mitral valve stenosis. Tricuspid Valve: The tricuspid valve is normal in structure. Tricuspid valve regurgitation is mild. Aortic Valve: The aortic valve is tricuspid. Aortic valve regurgitation is not visualized. Mild aortic valve  sclerosis is present, with no evidence of aortic valve stenosis. Venous: The inferior vena cava is dilated in size with less than 50% respiratory variability, suggesting right atrial pressure of 15 mmHg. IAS/Shunts: No atrial level shunt detected by color flow Doppler. LEFT VENTRICLE PLAX 2D LVIDd:         4.50 cm LVIDs:         3.40 cm LV PW:         1.10 cm LV IVS:        0.70 cm  IVC IVC diam: 2.30 cm LEFT ATRIUM         Index LA diam:    4.30 cm 2.20 cm/m  TRICUSPID VALVE TR Peak grad:   26.0 mmHg TR Vmax:        255.00 cm/s Loralie Champagne MD Electronically signed by Loralie Champagne MD Signature Date/Time: 07/12/2020/4:21:33 PM    Final (Updated)      All images have been reviewed by me personally.  EKG: Independently reviewed.   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   Assessment  plan  # SOB, B/L legs edema and weight gain concerning for volume overload  #Moderate Pul HTN  # chronic RV failure # Hypoalbuminemia  #PFO on previous echo  Patient presented with intermittent SOB/DOE, significant leg edema and weight gain.  Patient reports that his baseline weight was about 130's pounds and his current weight is 166 lbs today.  The clinical manifestation is suggestive of volume overload, which could be multifactorial related to acute on chronic RV failure and hypoalbuminemia   - admit to observation -Telemetry monitoring -Troponins, TSH, Free T4 -normal HGB a1C 5.8 - BNP 271 - Echo limited pending -Daily weight and strict intake and output -Heart healthy diet -No pulmonary edema  on CXR.  And he is not hypoxia -Diuretics-Lasix 60 mg IV twice daily -Cardiology is already on board and appreciate their input    #Recent admission with exudative pleural effusions between 10/25-11/12/2019 # S/P R thoracentesis 10/28 and left thoracentesis 10/29. Pleural fluid consistent with exudative  effusion, pleural fluid culture no growth  - portable CXR suggested Airspace opacity left base. But patient has no fever, denies cough/wheezing and no leukocytosis. Will repeat 2view CXR to have a better assessment of LLL. May need chest CT if 2 view CXR is equivocal  -Repeat CXR  Pending   # A fib  -Continue rate control-Toprol-XL, Cardizem, digoxin -Continue Eliquis   #metastatic prostate cancer #history of C4-C5-C6 laminectomies for resection of previous dorsal epidural metastatic tumor  06/25/2020 CTA chest shows bilateral effusions, extensive bony metastatic disease, 06/25/2020 brain MRI shows possible dural based metastasis.  - per DC summary on 07/05/2020, Casodex started 10/31 and and Lupron started 11/1 - Noted oncologyplan for outpatient follow-up with Dr. [name] for ongoing management of his metastatic prostate cancer-next appointment 11/17 andoncology is planning torepeatMRI scan in about 2 months which will be reviewedintumor board. MRI C-spine shows diffuse osseous metastasis. - he is currently on Casodex but I do not see Lupron on his med list. I spoke to oncall oncologist who recs for [name] to call DR. Shadad on Monday/ -   #hypothyroidism - TSH - continue home meds       DVT prophylaxis: SCD Code Status: Full code Family Communication: none at bedside Consults called: cardiology Admission status: observation   Status is: Observation  The patient remains OBS appropriate and will d/c before 2 midnights.  Dispo: The patient is from: Inpatient rehab              Anticipated d/c is to: TBD              Anticipated d/c date is: 2 days              Patient  currently is not medically stable to d/c.       Time Spent: 65 minutes.  >50% of the time was devoted to discussing the patients care, assessment, plan and disposition with other care givers along with counseling the patient about the risks and benefits of treatment.    Charlann Lange MD [facility]  If 7PM-7AM, please contact night-coverage   07/12/2020, 6:11 PM

## 2020-07-12 NOTE — Progress Notes (Signed)
  Echocardiogram 2D Echocardiogram has been performed.  Johny Chess 07/12/2020, 3:10 PM

## 2020-07-12 NOTE — Progress Notes (Signed)
Cardiology services consulted for recently diagnosed atrial fibrillation at brothers request for evaluation as patient will be staying here in the area.  A. fib recently diagnosed while in [place] and currently rate controlled maintained on Eliquis, Lanoxin as well as Toprol.  Await plans for recommendations and follow-up outpatient

## 2020-07-13 ENCOUNTER — Inpatient Hospital Stay (HOSPITAL_COMMUNITY): Payer: PRIVATE HEALTH INSURANCE

## 2020-07-13 ENCOUNTER — Observation Stay (HOSPITAL_COMMUNITY): Payer: PRIVATE HEALTH INSURANCE

## 2020-07-13 DIAGNOSIS — Z7901 Long term (current) use of anticoagulants: Secondary | ICD-10-CM | POA: Diagnosis not present

## 2020-07-13 DIAGNOSIS — I5033 Acute on chronic diastolic (congestive) heart failure: Secondary | ICD-10-CM | POA: Diagnosis not present

## 2020-07-13 DIAGNOSIS — I2729 Other secondary pulmonary hypertension: Secondary | ICD-10-CM | POA: Diagnosis present

## 2020-07-13 DIAGNOSIS — I272 Pulmonary hypertension, unspecified: Secondary | ICD-10-CM | POA: Diagnosis not present

## 2020-07-13 DIAGNOSIS — J9601 Acute respiratory failure with hypoxia: Secondary | ICD-10-CM | POA: Diagnosis present

## 2020-07-13 DIAGNOSIS — R0602 Shortness of breath: Secondary | ICD-10-CM | POA: Diagnosis present

## 2020-07-13 DIAGNOSIS — E8779 Other fluid overload: Secondary | ICD-10-CM | POA: Diagnosis not present

## 2020-07-13 DIAGNOSIS — E8770 Fluid overload, unspecified: Secondary | ICD-10-CM | POA: Diagnosis not present

## 2020-07-13 DIAGNOSIS — J90 Pleural effusion, not elsewhere classified: Secondary | ICD-10-CM | POA: Diagnosis not present

## 2020-07-13 DIAGNOSIS — D638 Anemia in other chronic diseases classified elsewhere: Secondary | ICD-10-CM | POA: Diagnosis present

## 2020-07-13 DIAGNOSIS — I4892 Unspecified atrial flutter: Secondary | ICD-10-CM | POA: Diagnosis not present

## 2020-07-13 DIAGNOSIS — I959 Hypotension, unspecified: Secondary | ICD-10-CM | POA: Diagnosis not present

## 2020-07-13 DIAGNOSIS — C7951 Secondary malignant neoplasm of bone: Secondary | ICD-10-CM | POA: Diagnosis present

## 2020-07-13 DIAGNOSIS — E8809 Other disorders of plasma-protein metabolism, not elsewhere classified: Secondary | ICD-10-CM | POA: Diagnosis present

## 2020-07-13 DIAGNOSIS — M7989 Other specified soft tissue disorders: Secondary | ICD-10-CM | POA: Diagnosis not present

## 2020-07-13 DIAGNOSIS — I4821 Permanent atrial fibrillation: Secondary | ICD-10-CM

## 2020-07-13 DIAGNOSIS — E039 Hypothyroidism, unspecified: Secondary | ICD-10-CM | POA: Diagnosis present

## 2020-07-13 DIAGNOSIS — Z79890 Hormone replacement therapy (postmenopausal): Secondary | ICD-10-CM | POA: Diagnosis not present

## 2020-07-13 DIAGNOSIS — Q211 Atrial septal defect: Secondary | ICD-10-CM | POA: Diagnosis not present

## 2020-07-13 DIAGNOSIS — Z79899 Other long term (current) drug therapy: Secondary | ICD-10-CM | POA: Diagnosis not present

## 2020-07-13 DIAGNOSIS — I50812 Chronic right heart failure: Secondary | ICD-10-CM | POA: Diagnosis not present

## 2020-07-13 DIAGNOSIS — Z8042 Family history of malignant neoplasm of prostate: Secondary | ICD-10-CM | POA: Diagnosis not present

## 2020-07-13 DIAGNOSIS — C61 Malignant neoplasm of prostate: Secondary | ICD-10-CM | POA: Diagnosis present

## 2020-07-13 LAB — CBC
HCT: 30.3 % — ABNORMAL LOW (ref 39.0–52.0)
Hemoglobin: 9 g/dL — ABNORMAL LOW (ref 13.0–17.0)
MCH: 28.6 pg (ref 26.0–34.0)
MCHC: 29.7 g/dL — ABNORMAL LOW (ref 30.0–36.0)
MCV: 96.2 fL (ref 80.0–100.0)
Platelets: 293 K/uL (ref 150–400)
RBC: 3.15 MIL/uL — ABNORMAL LOW (ref 4.22–5.81)
RDW: 23.1 % — ABNORMAL HIGH (ref 11.5–15.5)
WBC: 5.7 K/uL (ref 4.0–10.5)
nRBC: 3.7 % — ABNORMAL HIGH (ref 0.0–0.2)

## 2020-07-13 LAB — COMPREHENSIVE METABOLIC PANEL WITH GFR
ALT: 17 U/L (ref 0–44)
AST: 16 U/L (ref 15–41)
Albumin: 2.8 g/dL — ABNORMAL LOW (ref 3.5–5.0)
Alkaline Phosphatase: 2382 U/L — ABNORMAL HIGH (ref 38–126)
Anion gap: 6 (ref 5–15)
BUN: 22 mg/dL (ref 8–23)
CO2: 29 mmol/L (ref 22–32)
Calcium: 8.4 mg/dL — ABNORMAL LOW (ref 8.9–10.3)
Chloride: 102 mmol/L (ref 98–111)
Creatinine, Ser: 0.79 mg/dL (ref 0.61–1.24)
GFR, Estimated: >60 mL/min
Glucose, Bld: 94 mg/dL (ref 70–99)
Potassium: 4 mmol/L (ref 3.5–5.1)
Sodium: 137 mmol/L (ref 135–145)
Total Bilirubin: 1.1 mg/dL (ref 0.3–1.2)
Total Protein: 5.9 g/dL — ABNORMAL LOW (ref 6.5–8.1)

## 2020-07-13 LAB — PROTIME-INR
INR: 1.8 — ABNORMAL HIGH (ref 0.8–1.2)
Prothrombin Time: 20.4 seconds — ABNORMAL HIGH (ref 11.4–15.2)

## 2020-07-13 LAB — TSH: TSH: 5.022 u[IU]/mL — ABNORMAL HIGH (ref 0.350–4.500)

## 2020-07-13 LAB — T4, FREE: Free T4: 1.01 ng/dL (ref 0.61–1.12)

## 2020-07-13 MED ORDER — IOHEXOL 300 MG/ML  SOLN
75.0000 mL | Freq: Once | INTRAMUSCULAR | Status: AC | PRN
  Administered 2020-07-13: 75 mL via INTRAVENOUS

## 2020-07-13 NOTE — Progress Notes (Signed)
Bilateral lower extremity venous duplex has been completed. Preliminary results can be found in CV Proc through chart review.   07/13/20 3:42 PM Carlos Levering RVT

## 2020-07-13 NOTE — Evaluation (Signed)
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 Pt adm with afib with rvr, acute hypoxic respiratory failure likely secondary to malignant bil pleural effusions. Pt had recently been hospitalized in [place] with afib with rvr and resp failure requiring vent support and then left AMA before flying back to [facility] and presenting immediately to ED. 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 MRI - thickening and enhancement along the posterior falx. New hyperintensity in R parietal lobe that may reflect associated edema.  PMH - prostate CA with bony mets, C3-4 laminectomy with tumor removal 01/2019, afib   Clinical Impression   PTA pt at [facility] from recent admission here at [facility]. Pt with balance deficits, LUE diffuse weakness, and RUE RC deficits. Pt is familiar to this Probation officer and has made notable improvements in his recent time at [facility] with mobility and ADL. Pt able to complete sit <> stands and mobility at mod I level. Noted some higher level balance deficits. He has progressed to set up level for LB ADLs. Issued pt a higher level BUE HEP with level one theraband to continue to improve strength in RUE with RC issues and LUE with diffuse weakness. Recommend pt follow up with OP OT to continue progression of BUE strength for independent ADL. Will continue to follow per POC listed below.    Follow Up Recommendations  Outpatient OT    Equipment Recommendations  None recommended by OT    Recommendations for Other Services       Precautions / Restrictions Precautions Precautions: Fall Precaution Comments: Edema in BLE, RTC injury R shoulder Restrictions Weight  Bearing Restrictions: No      Mobility Bed Mobility Overal bed mobility: Modified Independent Bed Mobility: Supine to Sit;Sit to Supine Rolling: Modified independent (Device/Increase time) Sidelying to sit: Modified independent (Device/Increase time)       General bed mobility comments: up in chair, returned to chair    Transfers Overall transfer level: Modified independent Equipment used: None Transfers: Sit to/from Stand Sit to Stand: Modified independent (Device/Increase time)              Balance Overall balance assessment: Mild deficits observed, not formally tested                             High Level Balance Comments: higher level balance during ambulation demonstrating deficits; pt with 2 LOB with head movement laterally and vertically during ambulation, able to regain with stepping reaction; pt states his balance is worse           ADL either performed or assessed with clinical judgement   ADL Overall ADL's : Needs assistance/impaired Eating/Feeding: Independent;Sitting   Grooming: Supervision/safety;Standing   Upper Body Bathing: Set up;Sitting   Lower Body Bathing: Set up;Sitting/lateral leans;Sit to/from stand   Upper Body Dressing : Set up;Sitting   Lower Body Dressing: Set up;Sitting/lateral leans;Sit to/from stand   Toilet Transfer: Supervision/safety;Ambulation;Regular Toilet;Grab bars   Toileting- Clothing Manipulation and Hygiene: Set up;Sitting/lateral lean       Functional mobility during ADLs: Supervision/safety General ADL Comments: gave BUE HEP with level one theraband     Vision Baseline Vision/History: No visual deficits Patient Visual Report:  No change from baseline       Perception     Praxis      Pertinent Vitals/Pain Pain Assessment: No/denies pain     Hand Dominance Right   Extremity/Trunk Assessment Upper Extremity Assessment Upper Extremity Assessment: RUE deficits/detail RUE Deficits /  Details: Pt states he surffered a rotator cuff injury @ 4 months ago and his PCP had set him up with "therapy". Has not seen ortho MD for this yet. elbow/wrist/hand WFL; no ability to flex, abd or exteranlly rotate RUE; scapular ROM WFL LUE Deficits / Details: continues to present generally weaking from R. Grip 4-/5, still some extensor lag and weak extension at elbow. Weak shoulder girdle LUE Coordination: decreased fine motor;decreased gross motor   Lower Extremity Assessment Lower Extremity Assessment: Defer to PT evaluation       Communication Communication Communication: No difficulties   Cognition Arousal/Alertness: Awake/alert Behavior During Therapy: WFL for tasks assessed/performed Overall Cognitive Status: Impaired/Different from baseline Area of Impairment: Problem solving                       Following Commands: Follows one step commands consistently Safety/Judgement: Decreased awareness of deficits   Problem Solving: Slow processing;Requires verbal cues;Requires tactile cues General Comments: continues to require step by step instruction- appears to have difficulty following multistep/higher level instruction   General Comments  pt given standing exercise handout with demonstration and increased education to perform with UE support to decrease fall risk; pt verbalized understanding    Exercises Other Exercises Other Exercises: Access Code: [facility] for BUE HEP on medbridge   Shoulder Instructions      Home Living Family/patient expects to be discharged to:: Private residence Living Arrangements: Alone Available Help at Discharge: Family;Available 24 hours/day Type of Home: House Home Access: Stairs to enter     Home Layout: Able to live on main level with bedroom/bathroom     Bathroom Shower/Tub: Occupational psychologist: Standard Bathroom Accessibility: Yes How Accessible: Accessible via walker Home Equipment: None   Additional  Comments: to stay with brother in [place] at discharge with no stairs to enter and will be on the main level  Lives With: Alone    Prior Functioning/Environment Level of Independence: Independent        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        OT Problem List: Decreased strength;Decreased knowledge of use of DME or AE;Decreased activity tolerance;Decreased cognition;Impaired UE functional use;Impaired balance (sitting and/or standing);Decreased safety awareness;Decreased coordination      OT Treatment/Interventions: Self-care/ADL training;Therapeutic exercise;Patient/family education;Balance training;Energy conservation;Therapeutic activities;DME and/or AE instruction    OT Goals(Current goals can be found in the care plan section) Acute Rehab OT Goals Patient Stated Goal: to go home OT Goal Formulation: With patient Time For Goal Achievement: 07/27/20 Potential to Achieve Goals: Good  OT Frequency: Min 2X/week   Barriers to D/C:            Co-evaluation              AM-PAC OT "6 Clicks" Daily Activity     Outcome Measure Help from another person eating meals?: A Little Help from another person taking care of personal grooming?: A Little Help from another person toileting, which includes using toliet, bedpan, or urinal?: A Little Help from another person bathing (including washing, rinsing, drying)?: A Little Help from another person to put on and taking off regular upper body clothing?: A Little Help from another person  to put on and taking off regular lower body clothing?: A Little 6 Click Score: 18   End of Session Equipment Utilized During Treatment: Gait belt Nurse Communication: Mobility status  Activity Tolerance: Patient tolerated treatment well Patient left: in chair;with call bell/phone within reach  OT Visit Diagnosis: Unsteadiness on feet (R26.81);Other abnormalities of gait and mobility (R26.89);Muscle weakness (generalized) (M62.81);Other symptoms and signs  involving cognitive function                Time: 7169-6789 OT Time Calculation (min): 19 min Charges:  OT General Charges $OT Visit: 1 Visit OT Evaluation $OT Eval Moderate Complexity: [place], MSOT, OTR/L [facility] Office Number: [phone] Pager: [phone]  Zenovia Jarred 07/13/2020, 11:44 AM

## 2020-07-13 NOTE — Progress Notes (Signed)
[facility]  PROGRESS NOTE  Chris Morales TKW:[id number] DOB: Jan 15, 1956 DOA: 07/12/2020 PCP: Patient, No Pcp Per Admit date - 07/12/2020   Admitting Physician Charlann Lange, MD  Outpatient Primary MD for the patient is Patient, No Pcp Per  LOS - 1 Brief Narrative  Chris Morales is a 64 year old male with medical history significant for metastatic prostate cancer, persistent A. fib, chronic diastolic CHF with recent hospitalization on 92/42-68/3 for diastolic CHF exacerbation exudative bilateral effusions requiring thoracentesis (10/28, 10/29) and metastatic prostate cancer, with bony metastatic disease and concern for dural based metastasis who has been receiving inpatient rehabilitation embolus, inpatient rehab since 11/5.  Patient was seen by cardiology on 11/12 for evaluation at 8 PM and was noted to be significantly volume overloaded with high concern for acute exacerbation of diastolic heart failure with need for IV diuresis.  Patient was readmitted to [facility] on 11/12 by [facility] service.  Regarding his diastolic exacerbation patient is still significantly volume overloaded with ongoing dyspnea and atrial fibrillation with RVR, further complicated by hypoalbuminemia.  Patient also has moderate pulmonary hypertension with signs of right ventricular failure but unclear etiology.  CTA chest from earlier hospitalization was negative for PE.   Subjective  Today he states he still gets short of breath at rest. No chest pain. No abdominal pain. No cough. No fevers  A & P   Acute on chronic CHF with preserved ejection fraction exacerbation.  Repeat TTE on 11/13 showed preserved EF of 60-65%.  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 No PE on last CTA. No hx of OSA. No prior lung comorbidities.  --V/q scan to rule out chronic PE  Chronic atrial fibrillation, currently rate controlled. -Continue diltiazem, digoxin, Toprol, Eliquis  Bilateral pleural effusions.  Previously exudative based on thoracentesis on 10/28, 10/29.  Cytology at that time was negative for malignancy.  CT chest this a.m. showed small to moderate-sized bilateral pleural effusions with dependent bilateral lower lobe collapse/consolidation.  Has ongoing dyspnea with no current O2 requirements -monitor dyspnea and see if IV diuresis improves it -if O2 requirements increased her dyspnea does not improve with diuresis would likely warrant repeat thoracentesis, will monitor/statements with  Prostate cancer with widespread bony metastases.  Elevated alk phos.  History of C4/C5/C6 laminectomies for resection previous dorsal epidural metastatic tumor MRI of the brain more concerning for meningioma per neurology status post Lupron injection last hospitalization -Tolerating Casodex per oncology -Recommend repeat scan in 2 months to be reviewed by CNS tumor board per oncology -Follow-up with Dr. [name] on 07/17/2020 -Dr. [name] on Monday for nephrectomy for Lupron injections  Hypothyroidism, stable -Continue Synthroid 25 mcg  Family Communication  :  none Code Status : FULL  Disposition Plan  :  Patient is from home. Anticipated d/c date: 2 to 3 days. Barriers to d/c or necessity for inpatient status: IV lasix for CHF exacerbation as he is signficantly volume overloaded with ongoing dyspnea will be changed from observation to inpatient Consults  :  Cardiology  Procedures  :  none  DVT Prophylaxis  :  Eliquis  MDM: The below labs and imaging reports were reviewed and summarized above.  Medication management as above.  Lab Results  Component Value Date  PLT 293 07/13/2020    Diet :  Diet Order            Diet Heart  Room service appropriate? Yes; Fluid consistency: Thin  Diet effective now                  Inpatient Medications Scheduled Meds: . apixaban  5 mg Oral BID  . bicalutamide  50 mg Oral Daily  . digoxin  0.125 mg Oral Daily  . diltiazem  120 mg Oral Daily  . folic acid  1 mg Oral Daily  . furosemide  60 mg Intravenous BID  . levothyroxine  25 mcg Oral QAC breakfast  . metoprolol succinate  100 mg Oral BID   Continuous Infusions: PRN Meds:.acetaminophen, ondansetron **OR** ondansetron (ZOFRAN) IV, polyethylene glycol  Antibiotics  :   Anti-infectives (From admission, onward)   None       Objective   Vitals:   07/12/20 2042 07/13/20 0026 07/13/20 0456 07/13/20 0917  BP: 107/71   95/61  Pulse: 74   77  Resp: 18     Temp: 98.7 F (37.1 C)     TempSrc: Oral     SpO2: 100%     Weight:  72 kg 72 kg   Height:        SpO2: 100 %  Wt Readings from Last 3 Encounters:  07/13/20 72 kg  07/06/20 72.1 kg  07/05/20 70.6 kg     Intake/Output Summary (Last 24 hours) at 07/13/2020 1417 Last data filed at 07/13/2020 1304 Gross per 24 hour  Intake 240 ml  Output 5570 ml  Net -5330 ml    Physical Exam:     Awake Alert, Oriented X 3, Normal affect, no distress No new F.N deficits,  [place].AT, Normal respiratory effort on room air, decreased breath sounds at bases, crackles present,  3+ pitting edema of bilateral lower extremities to mid thigh +ve B.Sounds, Abd Soft, No tenderness, No rebound, guarding or rigidity. No Cyanosis, No new Rash or bruise     I have personally reviewed the following:   Data Reviewed:  CBC Recent Labs  Lab 07/08/20 0757 07/12/20 1512 07/13/20 0447  WBC 5.3 5.6 5.7  HGB 9.7* 9.1* 9.0*  HCT 31.8* 30.9* 30.3*  PLT 326 290 293  MCV 94.4 98.7 96.2  MCH 28.8 29.1 28.6  MCHC 30.5 29.4* 29.7*  RDW 22.5* 23.7* 23.1*  LYMPHSABS 1.3  --   --   MONOABS 0.4  --   --   EOSABS 0.1  --   --   BASOSABS 0.1  --   --     Chemistries  Recent  Labs  Lab 07/08/20 0757 07/12/20 1512 07/13/20 0447  NA 139 139 137  K 4.3 4.3 4.0  CL 108 106 102  CO2 23 21* 29  GLUCOSE 133* 103* 94  BUN 16 19 22   CREATININE 0.75 0.87 0.79  CALCIUM 8.1* 8.1* 8.4*  AST 18 17 16   ALT 21 17 17   ALKPHOS 2,559* 2,412* 2,382*  BILITOT 0.8 0.9 1.1   ------------------------------------------------------------------------------------------------------------------ No results for input(s): CHOL, HDL, LDLCALC, TRIG, CHOLHDL, LDLDIRECT in the last 72 hours.  Lab Results  Component Value Date   HGBA1C 5.8 (H) 06/25/2020   ------------------------------------------------------------------------------------------------------------------ Recent Labs    07/13/20 0447  TSH 5.022*   ------------------------------------------------------------------------------------------------------------------ No results for input(s): VITAMINB12, FOLATE, FERRITIN, TIBC, IRON, RETICCTPCT in the last 72 hours.  Coagulation profile Recent Labs  Lab 07/13/20 0447  INR 1.8*  No results for input(s): DDIMER in the last 72 hours.  Cardiac Enzymes No results for input(s): CKMB, TROPONINI, MYOGLOBIN in the last 168 hours.  Invalid input(s): CK ------------------------------------------------------------------------------------------------------------------    Component Value Date/Time   BNP 271.0 (H) 07/12/2020 1512    Micro Results No results found for this or any previous visit (from the past 240 hour(s)).  Radiology Reports DG Chest 1 View  Result Date: 07/12/2020 CLINICAL DATA:  Shortness of breath EXAM: CHEST  1 VIEW COMPARISON:  July 01, 2020.  History of prostate carcinoma FINDINGS: There is airspace consolidation in the left base region. Lungs elsewhere clear. Heart is upper normal in size with pulmonary vascularity normal. No adenopathy. Sclerotic bony metastases noted throughout the thoracic region. IMPRESSION: Airspace opacity left base  consistent with pneumonia. Lungs elsewhere clear. Heart upper normal in size. Widespread bony metastases noted throughout the thoracic region. Electronically Signed   By: Lowella Grip III M.D.   On: 07/12/2020 14:24   DG Chest 1 View  Result Date: 06/28/2020 CLINICAL DATA:  Left thoracentesis. EXAM: CHEST  1 VIEW COMPARISON:  06/28/2020. FINDINGS: Mediastinum and hilar structures normal. Stable cardiomegaly. Bilateral interstitial prominence again noted with interim improvement from prior exam. Findings suggests improving CHF. Improved left pleural effusion. Tiny right pleural effusion. No pneumothorax post thoracentesis. IMPRESSION: Findings consistent with improving CHF. Improved left pleural effusion. No pneumothorax post thoracentesis. Tiny right pleural effusion. Electronically Signed   By: Marcello Moores  Register   On: 06/28/2020 10:55   DG Chest 1 View  Result Date: 06/27/2020 CLINICAL DATA:  Post right thoracentesis. EXAM: CHEST  1 VIEW COMPARISON:  06/24/2020 FINDINGS: Interval improvement in right pleural effusion. No pneumothorax. Improved aeration right lung base Interval progression of left pleural effusion and left lower lobe atelectasis Extensive sclerotic bony changes compatible with metastatic prostate cancer. IMPRESSION: Improved right pleural effusion with no pneumothorax post thoracentesis Progression of left effusion and left lower lobe atelectasis. Bony metastatic disease. Electronically Signed   By: Franchot Gallo M.D.   On: 06/27/2020 12:06   DG Chest 2 View  Result Date: 07/12/2020 CLINICAL DATA:  Shortness of breath.  Metastatic prostate cancer. EXAM: CHEST - 2 VIEW COMPARISON:  07/12/2020 FINDINGS: Diffuse bony sclerosis compatible with diffuse osseous metastatic disease. Moderate bilateral pleural effusions with associated passive atelectasis. Airway thickening is present, suggesting bronchitis or reactive airways disease. Heart size within normal limits. IMPRESSION: 1.  Moderate bilateral pleural effusions with associated passive atelectasis. 2. Airway thickening is present, suggesting bronchitis or reactive airways disease. 3. Diffuse sclerotic osseous metastatic disease. Electronically Signed   By: Van Clines M.D.   On: 07/12/2020 18:24   DG Shoulder Right  Result Date: 07/08/2020 CLINICAL DATA:  Shoulder weakness, bone metastasis, torn rotator cuff EXAM: RIGHT SHOULDER - 2+ VIEW COMPARISON:  Radiograph 02/20/2014 FINDINGS: A diffusely mottled sclerotic appearance of the bones is compatible with known osseous metastatic disease. There is a high-riding appearance of the humeral head which is suggestive of underlying rotator cuff insufficiency particularly in the setting of known rotator cuff tear. Moderate arthrosis of the glenohumeral and acromioclavicular joints. No acute traumatic osseous injury is present at this time. IMPRESSION: 1. Diffusely mottled sclerotic appearance of the bones compatible with known osseous metastatic disease. 2. High-riding appearance of the humeral head is suggestive of underlying rotator cuff insufficiency, particularly in the setting of known rotator cuff tear. 3. No acute fracture or traumatic malalignment. Electronically Signed   By: Lovena Le M.D.   On: 07/08/2020  19:23   CT CHEST W CONTRAST  Result Date: 07/13/2020 CLINICAL DATA:  Pleural effusion on chest x-ray. EXAM: CT CHEST WITH CONTRAST TECHNIQUE: Multidetector CT imaging of the chest was performed during intravenous contrast administration. CONTRAST:  48m OMNIPAQUE IOHEXOL 300 MG/ML  SOLN COMPARISON:  06/25/2020 FINDINGS: Cardiovascular: The heart size is normal. No substantial pericardial effusion. No thoracic aortic aneurysm no large central pulmonary embolus Mediastinum/Nodes: No mediastinal lymphadenopathy. Calcified nodal tissue noted in the right hilum. There is no hilar lymphadenopathy. The esophagus has normal imaging features. There is no axillary  lymphadenopathy. Lungs/Pleura: Patchy areas of central ground-glass attenuation are seen in the upper lobes, right greater than left, decreased in the interval. There is dependent bilateral lower lobe collapse/consolidation with small to moderate bilateral pleural effusions. Upper Abdomen: Heterogeneous liver parenchyma is nonspecific but may be related to fatty deposition or cirrhosis. Calcified granulomata noted in the spleen. Musculoskeletal: Widespread sclerotic bone metastases again noted. IMPRESSION: 1. Patchy areas of central ground-glass attenuation in the upper lobes, right greater than left, decreased in the interval. Imaging features likely reflect improving infectious/inflammatory etiology. 2. Small to moderate bilateral pleural effusions with dependent bilateral lower lobe collapse/consolidation. 3. Widespread sclerotic bone metastases. 4. Heterogeneous liver parenchyma, nonspecific but may be related to fatty deposition or cirrhosis. Metastatic disease considered less likely but not excluded. Electronically Signed   By: EMisty StanleyM.D.   On: 07/13/2020 09:23   CT ANGIO CHEST PE W OR WO CONTRAST  Result Date: 06/25/2020 CLINICAL DATA:  Worsening shortness of breath EXAM: CT ANGIOGRAPHY CHEST WITH CONTRAST TECHNIQUE: Multidetector CT imaging of the chest was performed using the standard protocol during bolus administration of intravenous contrast. Multiplanar CT image reconstructions and MIPs were obtained to evaluate the vascular anatomy. CONTRAST:  104mOMNIPAQUE IOHEXOL 350 MG/ML SOLN COMPARISON:  Radiograph same day FINDINGS: Cardiovascular: There is a optimal opacification of the pulmonary arteries. There is no central,segmental, or subsegmental filling defects within the pulmonary arteries. There is mild cardiomegaly present. No pericardial effusion or thickening. No evidence right heart strain. There is normal three-vessel brachiocephalic anatomy without proximal stenosis. The thoracic  aorta is normal in appearance. Mediastinum/Nodes: No hilar, mediastinal, or axillary adenopathy. Thyroid gland, trachea, and esophagus demonstrate no significant findings. Lungs/Pleura: There is moderate to large bilateral pleural effusion with adjacent compressive atelectasis seen. Patchy rounded ground-glass opacities are seen predominantly within the right upper lung and right middle lobe. Upper Abdomen: No acute abnormalities present in the visualized portions of the upper abdomen. There is diffuse anasarca present. Musculoskeletal: Extensive osseous sclerotic blastic lesions are seen throughout the visualized portion of the axial and appendicular skeleton. Review of the MIP images confirms the above findings. IMPRESSION: No central, segmental, or subsegmental pulmonary embolism Moderate to large bilateral pleural effusions with adjacent compressive atelectasis. Ground-glass opacities within the right upper lung and right middle lobe, likely due to infectious or inflammatory etiology. Extensive diffuse osseous metastatic disease. Electronically Signed   By: BiPrudencio Pair.D.   On: 06/25/2020 03:11   MR BRAIN W WO CONTRAST  Result Date: 06/25/2020 CLINICAL DATA:  Left-sided weakness, metastatic prostate cancer EXAM: MRI HEAD WITHOUT AND WITH CONTRAST TECHNIQUE: Multiplanar, multiecho pulse sequences of the brain and surrounding structures were obtained without and with intravenous contrast. CONTRAST:  10102mADAVIST GADOBUTROL 1 MMOL/ML IV SOLN COMPARISON:  2020 FINDINGS: Brain: There is thickening and enhancement along the posterior falx measuring 5 mm in thickness. The adjacent superior sagittal sinus is uninvolved. There is new minimal  adjacent T2 FLAIR hyperintensity in the right parietal lobe that may reflect associated edema. There is no acute infarction or intracranial hemorrhage. There is no parenchymal mass or mass effect. There is no hydrocephalus or extra-axial fluid collection. Prominence of the  ventricles and sulci reflects minor generalized parenchymal volume loss. Patchy foci of T2 hyperintensity in the supratentorial white matter are nonspecific but probably reflect mild chronic microvascular ischemic changes. These findings have progressed since 2020. Vascular: Major vessel flow voids at the skull base are preserved. Skull and upper cervical spine: Abnormal marrow signal likely reflecting diffuse metastatic disease. Sinuses/Orbits: Chronic right maxillary sinusitis. Orbits are unremarkable. Other: Sella is unremarkable.  Bilateral mastoid effusions. IMPRESSION: No acute infarction. Thickening enhancement along the posterior falx cerebri with possible minimal associated parenchymal edema. Differential considerations include dural-based metastasis (more likely given normal appearance on prior study) and meningioma. Diffuse osseous metastatic disease. Electronically Signed   By: Macy Mis M.D.   On: 06/25/2020 08:24   MR CERVICAL SPINE W WO CONTRAST  Addendum Date: 07/05/2020   ADDENDUM REPORT: 07/05/2020 13:24 ADDENDUM: I was subsequently contacted by Dr. Curly Shores of neurology who directed my attention to subtle dural thickening and enhancement at the C5-C6 level. The dura measures 1-2 mm in thickness at this level. This finding may be postoperative in etiology. Trace dural-based tumor is difficult to definitively exclude. Consider contrast-enhanced MRI follow-up for surveillance. No resultant significant spinal cord mass effect at this level. This addendum was discussed with Dr. Curly Shores by telephone at approximately 1:30 p.m. on 07/05/2020. Electronically Signed   By: Kellie Simmering DO   On: 07/05/2020 13:24   Result Date: 07/05/2020 CLINICAL DATA:  Myelopathy, acute or progressive; left arm weakness. Additional history provided: History of prostate cancer metastatic to bone, status post cervical laminectomy with tumor removal in June 2020. EXAM: MRI CERVICAL SPINE WITHOUT AND WITH CONTRAST  TECHNIQUE: Multiplanar and multiecho pulse sequences of the cervical spine, to include the craniocervical junction and cervicothoracic junction, were obtained without and with intravenous contrast. CONTRAST:  46m GADAVIST GADOBUTROL 1 MMOL/ML IV SOLN COMPARISON:  Radiographs of the cervical spine 02/20/2019. Cervical spine MRI 02/19/2019. Cervical spine MRI 02/20/2019. FINDINGS: Alignment: Reversal of the expected cervical lordosis. No significant spondylolisthesis. Vertebrae: Vertebral body height is maintained. Diffuse metastatic disease throughout the cervical spine. Since the prior MRI of 02/20/2019, there has been interval C4, C5 and C6 laminectomies for resection of epidural metastatic tumor and microdiscectomy. Multilevel degenerative endplate irregularity. Cord: No spinal cord signal abnormality. Posterior Fossa, vertebral arteries, paraspinal tissues: No abnormality identified within included portions of the posterior fossa. Bilateral mastoid effusions. Flow voids preserved within the imaged cervical vertebral arteries. Paraspinal soft tissues within normal limits. Unchanged nonspecific 13 mm round T2 hyperintense subcutaneous lesion within the posterior right upper back (series 8, image 36). Disc levels: Unless otherwise stated, the level by level findings below have not significantly changed since prior MRIs of 02/20/2019 and 02/19/2019. Severe disc degeneration at C3-C4, C4-C5, C5-C6, C6-C7 and C7-T1. C2-C3: No significant disc herniation or stenosis. C3-C4: Disc bulge. Uncovertebral and facet hypertrophy. The disc bulge effaces the ventral thecal sac, contacting and minimally flattening the ventral spinal cord with overall mild relative spinal canal narrowing. Moderate/severe left neural foraminal narrowing. C4-C5: Interval laminectomies for resection of previous dorsal epidural tumor. Posterior disc osteophyte complex. Uncinate and facet hypertrophy. No significant spinal canal stenosis. Bilateral  neural foraminal narrowing (mild right, moderate left). C5-C6: Interval laminectomies for resection of previous dorsal epidural tumor. Posterior  disc osteophyte complex. Uncinate and facet hypertrophy. The disc osteophyte complex minimally effaces the ventral thecal sac without significant spinal canal stenosis. Mild/moderate bilateral neural foraminal narrowing. C6-C7: Posterior disc osteophyte complex. Uncinate and facet hypertrophy. Mild spinal canal stenosis. Bilateral neural foraminal narrowing (mild right, moderate left). C7-T1: This level is imaged sagittally. No significant spinal canal stenosis. Mild bilateral neural foraminal narrowing. IMPRESSION: Since the prior MRI of 02/20/2019, there has been interval C4, C5 and C6 laminectomies for resection of previous dorsal epidural metastatic tumor. No evidence of recurrent epidural tumor at this site. No convincing evidence of epidural metastatic disease within the cervical spine. 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 Cervical spondylosis as outlined and having not significantly changed from the cervical spine MRI of 02/20/2020. Severe disc degeneration throughout the majority of the cervical spine. No more than mild spinal canal stenosis at any level. At C3-C4, a disc bulge contributes to mild relative spinal canal narrowing, contacting and minimally flattening the ventral spinal cord. Multilevel neural foraminal narrowing greatest on the left at C3-C4 (moderate/severe), on the left at C4-C5 (moderate) and on the left at C6-C7 (moderate). 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 EXAM: PORTABLE CHEST 1 VIEW COMPARISON:  06/28/2020 FINDINGS: The heart size and mediastinal contours are within normal limits. No significant recurrence of  left pleural fluid since thoracentesis with probable small pleural effusion remaining. No pneumothorax. Stable bibasilar atelectasis. Stable diffuse sclerotic metastatic disease involving the thoracic spine. IMPRESSION: No significant recurrence of left pleural fluid since thoracentesis with probable small pleural effusion remaining. Stable bibasilar atelectasis. Electronically Signed   By: Aletta Edouard M.D.   On: 07/01/2020 08:03   DG CHEST PORT 1 VIEW  Result Date: 06/28/2020 CLINICAL DATA:  Shortness of breath. EXAM: PORTABLE CHEST 1 VIEW COMPARISON:  06/27/2020. FINDINGS: Mediastinum and heart size normal. Progression of diffuse left lung interstitial infiltrates/edema. Diffuse right lung interstitial infiltrates/edema noted on today's exam. Persistent left-sided pleural effusion without interim change. No pneumothorax. Extensive sclerotic bony metastatic disease again noted. IMPRESSION: 1. Progression of diffuse left lung interstitial infiltrates/edema. Diffuse right lung interstitial infiltrates/edema noted on today's exam. Persistent left-sided pleural effusion without interim change. 2. Extensive sclerotic bony metastatic disease again noted. Electronically Signed   By: [place]   On: 06/28/2020 05:52   DG Chest Portable 1 View  Result Date: 06/24/2020 CLINICAL DATA:  Tachycardia. EXAM: PORTABLE CHEST 1 VIEW COMPARISON:  February 19, 2019 FINDINGS: There is cardiomegaly. There are moderate to large bilateral pleural effusions. There is bibasilar atelectasis. There is diffuse sclerosis throughout the visualized osseous structures most notably the thoracic spine. There is no pneumothorax. IMPRESSION: 1. Moderate to large bilateral pleural effusions with adjacent atelectasis. 2. Cardiomegaly. 3. Progressive sclerosis throughout the visualized osseous structures consistent with metastatic disease. Electronically Signed   By: Constance Holster M.D.   On: 06/24/2020 21:33   US LIVER  DOPPLER  Result Date: 06/25/2020 CLINICAL DATA:  64 year old male with a history of liver disease EXAM: DUPLEX ULTRASOUND OF LIVER TECHNIQUE: Color and duplex Doppler ultrasound was performed to evaluate the hepatic in-flow and out-flow vessels. COMPARISON:  None. FINDINGS: Portal Vein Velocities Main:  43 cm/sec Right:  28 cm/sec Left:  18 cm/sec Hepatic Vein Velocities Right:  77 cm/sec Middle:  78 cm/sec Left:  92 cm/sec Hepatic Artery  Velocity:  135 cm/sec Splenic Vein Velocity:  24 cm/sec Varices: Absent Ascites: Absent Spleen volume estimated 52 cubic cm Pleural fluid bilaterally IMPRESSION: Unremarkable duplex of the hepatic vasculature. Bilateral pleural effusions Electronically Signed   By: Corrie Mckusick D.O.   On: 06/25/2020 14:47   ECHOCARDIOGRAM COMPLETE BUBBLE STUDY  Result Date: 06/25/2020    ECHOCARDIOGRAM REPORT   Patient Name:   Chris Morales Date of Exam: 06/25/2020 Medical Rec #:  [id number]    Height:       73.0 in Accession #:    [id number]   Weight:       150.0 lb Date of Birth:  [birth year]    BSA:          1.904 m Patient Age:    17 years     BP:           111/75 mmHg Patient Gender: M            HR:           126 bpm. Exam Location:  Inpatient Procedure: 2D Echo, Cardiac Doppler, Color Doppler and Saline Contrast Bubble            Study Indications:    Atrial fibrillation with rapid ventricular response ([place])  History:        Patient has no prior history of Echocardiogram examinations.  Sonographer:    Bernadene Person RDCS Referring Phys: [id number] [place]  1. Left ventricular ejection fraction, by estimation, is 50 to 55%. The left ventricle has low normal function. The left ventricle has no regional wall motion abnormalities. Left ventricular diastolic parameters were normal. There is the interventricular septum is flattened in diastole ('D' shaped left ventricle), consistent with right ventricular volume overload.  2. Right ventricular systolic function is mildly  reduced. The right ventricular size is mildly enlarged. There is mildly elevated pulmonary artery systolic pressure. The estimated right ventricular systolic pressure is 71.6 mmHg.  3. Left atrial size was mildly dilated.  4. Right atrial size was mildly dilated.  5. Large pleural effusion in the left lateral region.  6. The mitral valve is normal in structure. Mild mitral valve regurgitation.  7. Tricuspid valve regurgitation is mild to moderate.  8. The aortic valve is normal in structure. Aortic valve regurgitation is not visualized. Mild aortic valve sclerosis is present, with no evidence of aortic valve stenosis.  9. The inferior vena cava is dilated in size with <50% respiratory variability, suggesting right atrial pressure of 15 mmHg. 10. Evidence of atrial level shunting detected by color flow Doppler. Agitated saline contrast bubble study was positive with shunting observed within 3-6 cardiac cycles suggestive of interatrial shunt. Comparison(s): No prior Echocardiogram. Findings are suggestive of right heart volume overload due to left-to-right shunt and there is a small right to left interatrial shunt by saline contrast study. However, an atrial septal defect could not be outlined with 2D and color Doppler imaging. Consider TEE or CT angiography to evaluate for anormalous pulmonary vein return or occult ASD. Qp:Qs shunt fraction could not be accurately calculated due to poor quality RV outflow imaging. FINDINGS  Left Ventricle: Left ventricular ejection fraction, by estimation, is 50 to 55%. The left ventricle has low normal function. The left ventricle has no regional wall motion abnormalities. The left ventricular internal cavity size was normal in size. There is no left ventricular hypertrophy. The interventricular septum is flattened in diastole ('D' shaped left ventricle), consistent with right ventricular volume overload.  Left ventricular diastolic parameters were normal. Normal left ventricular  filling pressure. Right Ventricle: The right ventricular size is mildly enlarged. No increase in right ventricular wall thickness. Right ventricular systolic function is mildly reduced. There is mildly elevated pulmonary artery systolic pressure. The tricuspid regurgitant  velocity is 2.60 m/s, and with an assumed right atrial pressure of 15 mmHg, the estimated right ventricular systolic pressure is 61.4 mmHg. Left Atrium: Left atrial size was mildly dilated. Right Atrium: Right atrial size was mildly dilated. Pericardium: There is no evidence of pericardial effusion. Mitral Valve: The mitral valve is normal in structure. Mild mitral valve regurgitation. Tricuspid Valve: The tricuspid valve is grossly normal. Tricuspid valve regurgitation is mild to moderate. Aortic Valve: The aortic valve is normal in structure. Aortic valve regurgitation is not visualized. Mild aortic valve sclerosis is present, with no evidence of aortic valve stenosis. Pulmonic Valve: The pulmonic valve was not well visualized. Pulmonic valve regurgitation is not visualized. Aorta: The aortic root and ascending aorta are structurally normal, with no evidence of dilitation. Venous: The inferior vena cava is dilated in size with less than 50% respiratory variability, suggesting right atrial pressure of 15 mmHg. IAS/Shunts: There is redundancy of the interatrial septum. Evidence of atrial level shunting detected by color flow Doppler. Agitated saline contrast was given intravenously to evaluate for intracardiac shunting. Agitated saline contrast bubble study was  positive with shunting observed within 3-6 cardiac cycles suggestive of interatrial shunt. Additional Comments: There is a large pleural effusion in the left lateral region.  LEFT VENTRICLE PLAX 2D LVIDd:         4.10 cm     Diastology LVIDs:         3.00 cm     LV e' medial:    13.40 cm/s LV PW:         1.00 cm     LV E/e' medial:  6.5 LV IVS:        0.90 cm     LV e' lateral:   9.00 cm/s  LVOT diam:     2.10 cm     LV E/e' lateral: 9.7 LV SV:         68 LV SV Index:   36 LVOT Area:     3.46 cm  LV Volumes (MOD) LV vol d, MOD A2C: 63.8 ml LV vol d, MOD A4C: 58.6 ml LV vol s, MOD A2C: 32.5 ml LV vol s, MOD A4C: 27.6 ml LV SV MOD A2C:     31.3 ml LV SV MOD A4C:     58.6 ml LV SV MOD BP:      32.7 ml RIGHT VENTRICLE RV S prime:     6.85 cm/s TAPSE (M-mode): 1.7 cm LEFT ATRIUM             Index       RIGHT ATRIUM           Index LA diam:        4.00 cm 2.10 cm/m  RA Area:     21.40 cm LA Vol (A2C):   37.5 ml 19.70 ml/m RA Volume:   61.60 ml  32.35 ml/m LA Vol (A4C):   53.1 ml 27.89 ml/m LA Biplane Vol: 44.8 ml 23.53 ml/m  AORTIC VALVE             PULMONIC VALVE LVOT Vmax:   89.40 cm/s  RVOT Peak grad: 2 mmHg LVOT Vmean:  65.500 cm/s LVOT VTI:    0.197  m  AORTA Ao Root diam: 2.70 cm Ao Asc diam:  2.90 cm MITRAL VALVE               TRICUSPID VALVE MV Area (PHT): 3.77 cm    TR Peak grad:   27.0 mmHg MV Decel Time: 201 msec    TR Vmax:        260.00 cm/s MV E velocity: 87.50 cm/s MV A velocity: 90.80 cm/s  SHUNTS MV E/A ratio:  0.96        Systemic VTI:  0.20 m                            Systemic Diam: 2.10 cm                            Pulmonic VTI:  0.087 m Dani Gobble Croitoru MD Electronically signed by Sanda Klein MD Signature Date/Time: 06/25/2020/12:54:37 PM    Final    ECHOCARDIOGRAM LIMITED  Result Date: 07/12/2020    ECHOCARDIOGRAM LIMITED REPORT   Patient Name:   Chris Morales Date of Exam: 07/12/2020 Medical Rec #:  [id number]    Height:       73.0 in Accession #:    [id number]   Weight:       159.0 lb Date of Birth:  24-Apr-1956    BSA:          1.951 m Patient Age:    6 years     BP:           116/65 mmHg Patient Gender: M            HR:           87 bpm. Exam Location:  Inpatient Procedure: Limited Echo, Limited Color Doppler and Cardiac Doppler Indications:     atrial fibrillation  History:         Patient has no prior history of Echocardiogram examinations.  Sonographer:     Johny Chess Referring Phys:  [id number] Leanor Kail Diagnosing Phys: Loralie Champagne MD IMPRESSIONS  1. Left ventricular ejection fraction, by estimation, is 60 to 65%. The left ventricle has normal function. There is mild left ventricular hypertrophy. Left ventricular diastolic parameters are indeterminate.  2. Right ventricular systolic function is normal. The right ventricular size is mildly enlarged. There is mildly elevated pulmonary artery systolic pressure. The estimated right ventricular systolic pressure is 35.5 mmHg. Mildly D-shaped interventricular septum suggests elevated RV pressure.  3. Left atrial size was moderately dilated.  4. Right atrial size was moderately dilated.  5. The mitral valve is normal in structure. Trivial mitral valve regurgitation. No evidence of mitral stenosis.  6. The aortic valve is tricuspid. Aortic valve regurgitation is not visualized. Mild aortic valve sclerosis is present, with no evidence of aortic valve stenosis.  7. The inferior vena cava is dilated in size with <50% respiratory variability, suggesting right atrial pressure of 15 mmHg.  8. Significant left pleural effusion. FINDINGS  Left Ventricle: Left ventricular ejection fraction, by estimation, is 60 to 65%. The left ventricle has normal function. The left ventricular internal cavity size was normal in size. There is mild left ventricular hypertrophy. Left ventricular diastolic  parameters are indeterminate. Right Ventricle: The right ventricular size is mildly enlarged. No increase in right ventricular wall thickness. Right ventricular systolic function is normal. There is mildly elevated pulmonary artery systolic pressure. The tricuspid regurgitant velocity  is 2.55 m/s, and with an assumed right atrial pressure of 15 mmHg, the estimated right ventricular systolic pressure is 81.1 mmHg. Left Atrium: Left atrial size was moderately dilated. Right Atrium: Right atrial size was moderately dilated. Pericardium:  Significant left pleural effusion. Trivial pericardial effusion is present. Mitral Valve: The mitral valve is normal in structure. Trivial mitral valve regurgitation. No evidence of mitral valve stenosis. Tricuspid Valve: The tricuspid valve is normal in structure. Tricuspid valve regurgitation is mild. Aortic Valve: The aortic valve is tricuspid. Aortic valve regurgitation is not visualized. Mild aortic valve sclerosis is present, with no evidence of aortic valve stenosis. Venous: The inferior vena cava is dilated in size with less than 50% respiratory variability, suggesting right atrial pressure of 15 mmHg. IAS/Shunts: No atrial level shunt detected by color flow Doppler. LEFT VENTRICLE PLAX 2D LVIDd:         4.50 cm LVIDs:         3.40 cm LV PW:         1.10 cm LV IVS:        0.70 cm  IVC IVC diam: 2.30 cm LEFT ATRIUM         Index LA diam:    4.30 cm 2.20 cm/m  TRICUSPID VALVE TR Peak grad:   26.0 mmHg TR Vmax:        255.00 cm/s Loralie Champagne MD Electronically signed by Loralie Champagne MD Signature Date/Time: 07/12/2020/4:21:33 PM    Final (Updated)    IR THORACENTESIS ASP PLEURAL SPACE W/IMG GUIDE  Result Date: 06/28/2020 INDICATION: Patient with a history of prostate cancer with extensive metastases now has new onset pleural effusions. Interventional radiology asked to perform a therapeutic and diagnostic thoracentesis. EXAM: ULTRASOUND GUIDED THORACENTESIS MEDICATIONS: 1% lidocaine 10 mL COMPLICATIONS: None immediate. PROCEDURE: An ultrasound guided thoracentesis was thoroughly discussed with the patient and questions answered. The benefits, risks, alternatives and complications were also discussed. The patient understands and wishes to proceed with the procedure. Written consent was obtained. Ultrasound was performed to localize and mark an adequate pocket of fluid in the left chest. The area was then prepped and draped in the normal sterile fashion. 1% Lidocaine was used for local anesthesia. Under  ultrasound guidance a 6 Fr Safe-T-Centesis catheter was introduced. Thoracentesis was performed. The catheter was removed and a dressing applied. FINDINGS: A total of approximately 1.3 L of bright yellow fluid was removed. Samples were sent to the laboratory as requested by the clinical team. IMPRESSION: Successful ultrasound guided left thoracentesis yielding 1.3 L of pleural fluid. Read by: Soyla Dryer, NP Electronically Signed   By: Corrie Mckusick D.O.   On: 06/28/2020 12:11   IR THORACENTESIS ASP PLEURAL SPACE W/IMG GUIDE  Result Date: 06/27/2020 INDICATION: Patient with a history of prostate cancer with extensive metastases now has new onset pleural effusions. Interventional radiology asked to perform a therapeutic and diagnostic thoracentesis. EXAM: ULTRASOUND GUIDED THORACENTESIS MEDICATIONS: 1% lidocaine 20 mL COMPLICATIONS: None immediate. PROCEDURE: An ultrasound guided thoracentesis was thoroughly discussed with the patient and questions answered. The benefits, risks, alternatives and complications were also discussed. The patient understands and wishes to proceed with the procedure. Written consent was obtained. Ultrasound was performed to localize and mark an adequate pocket of fluid in the right chest. The area was then prepped and draped in the normal sterile fashion. 1% Lidocaine was used for local anesthesia. Under ultrasound guidance a 6 Fr Safe-T-Centesis catheter was introduced. Thoracentesis was performed. The catheter was removed and a  dressing applied. FINDINGS: A total of approximately 1.6 L of dark yellow fluid was removed. Samples were sent to the laboratory as requested by the clinical team. IMPRESSION: Successful ultrasound guided right thoracentesis yielding 1.6 L of pleural fluid. Read by: Soyla Dryer, NP Electronically Signed   By: Markus Daft M.D.   On: 06/27/2020 12:29     Time Spent in minutes  30     Desiree Hane M.D on 07/13/2020 at 2:17 PM  To page go to  [email or web address] - password [facility]

## 2020-07-13 NOTE — Plan of Care (Signed)
  Problem: Cardiac: Goal: Ability to achieve and maintain adequate cardiopulmonary perfusion will improve Outcome: Progressing   

## 2020-07-13 NOTE — Progress Notes (Signed)
Progress Note  Patient Name: Travonta Gill Date of Encounter: 07/13/2020  [facility] Cardiologist: Dr Marisue Ivan  Subjective   No CP; dyspnea improving  Inpatient Medications    Scheduled Meds: . apixaban  5 mg Oral BID  . bicalutamide  50 mg Oral Daily  . digoxin  0.125 mg Oral Daily  . diltiazem  120 mg Oral Daily  . folic acid  1 mg Oral Daily  . furosemide  60 mg Intravenous BID  . levothyroxine  25 mcg Oral QAC breakfast  . metoprolol succinate  100 mg Oral BID   Continuous Infusions:  PRN Meds: acetaminophen, ondansetron **OR** ondansetron (ZOFRAN) IV, polyethylene glycol   Vital Signs    Vitals:   07/12/20 1721 07/12/20 2042 07/13/20 0026 07/13/20 0456  BP:  107/71    Pulse:  74    Resp:  18    Temp:  98.7 F (37.1 C)    TempSrc:  Oral    SpO2:  100%    Weight: 75.3 kg  72 kg 72 kg  Height: 6\' 1"  (1.854 m)       Intake/Output Summary (Last 24 hours) at 07/13/2020 0826 Last data filed at 07/13/2020 0700 Gross per 24 hour  Intake --  Output 3580 ml  Net -3580 ml   Last 3 Weights 07/13/2020 07/13/2020 07/12/2020  Weight (lbs) 158 lb 11.7 oz 158 lb 11.2 oz 166 lb  Weight (kg) 72 kg 71.986 kg 75.297 kg      Telemetry    Atrial fibrillation rate controlled- Personally Reviewed   Physical Exam   GEN: No acute distress.   Neck: supple Cardiac: irregular Respiratory: Clear to auscultation bilaterally. GI: Soft, nontender, non-distended  MS: 2+ edema Neuro:  Nonfocal  Psych: Normal affect   Labs    High Sensitivity Troponin:   Recent Labs  Lab 06/25/20 1546 07/12/20 1730 07/12/20 1900  TROPONINIHS 15 17 19*      Chemistry Recent Labs  Lab 07/08/20 0757 07/12/20 1512 07/13/20 0447  NA 139 139 137  K 4.3 4.3 4.0  CL 108 106 102  CO2 23 21* 29  GLUCOSE 133* 103* 94  BUN 16 19 22   CREATININE 0.75 0.87 0.79  CALCIUM 8.1* 8.1* 8.4*  PROT 5.8* 5.8* 5.9*  ALBUMIN 2.6* 2.9* 2.8*  AST 18 17 16   ALT 21 17 17   ALKPHOS 2,559*  2,412* 2,382*  BILITOT 0.8 0.9 1.1  GFRNONAA >60 >60 >60  ANIONGAP 8 12 6      Hematology Recent Labs  Lab 07/08/20 0757 07/12/20 1512 07/13/20 0447  WBC 5.3 5.6 5.7  RBC 3.37* 3.13* 3.15*  HGB 9.7* 9.1* 9.0*  HCT 31.8* 30.9* 30.3*  MCV 94.4 98.7 96.2  MCH 28.8 29.1 28.6  MCHC 30.5 29.4* 29.7*  RDW 22.5* 23.7* 23.1*  PLT 326 290 293    BNP Recent Labs  Lab 07/12/20 1512  BNP 271.0*     Radiology    DG Chest 1 View  Result Date: 07/12/2020 CLINICAL DATA:  Shortness of breath EXAM: CHEST  1 VIEW COMPARISON:  July 01, 2020.  History of prostate carcinoma FINDINGS: There is airspace consolidation in the left base region. Lungs elsewhere clear. Heart is upper normal in size with pulmonary vascularity normal. No adenopathy. Sclerotic bony metastases noted throughout the thoracic region. IMPRESSION: Airspace opacity left base consistent with pneumonia. Lungs elsewhere clear. Heart upper normal in size. Widespread bony metastases noted throughout the thoracic region. Electronically Signed   By: Gwyndolyn Saxon  Jasmine December III M.D.   On: 07/12/2020 14:24   DG Chest 2 View  Result Date: 07/12/2020 CLINICAL DATA:  Shortness of breath.  Metastatic prostate cancer. EXAM: CHEST - 2 VIEW COMPARISON:  07/12/2020 FINDINGS: Diffuse bony sclerosis compatible with diffuse osseous metastatic disease. Moderate bilateral pleural effusions with associated passive atelectasis. Airway thickening is present, suggesting bronchitis or reactive airways disease. Heart size within normal limits. IMPRESSION: 1. Moderate bilateral pleural effusions with associated passive atelectasis. 2. Airway thickening is present, suggesting bronchitis or reactive airways disease. 3. Diffuse sclerotic osseous metastatic disease. Electronically Signed   By: Van Clines M.D.   On: 07/12/2020 18:24   ECHOCARDIOGRAM LIMITED  Result Date: 07/12/2020    ECHOCARDIOGRAM LIMITED REPORT   Patient Name:   IRBIN FINES Date of  Exam: 07/12/2020 Medical Rec #:  [id number]    Height:       73.0 in Accession #:    [id number]   Weight:       159.0 lb Date of Birth:  Aug 10, 1956    BSA:          1.951 m Patient Age:    64 years     BP:           116/65 mmHg Patient Gender: M            HR:           87 bpm. Exam Location:  Inpatient Procedure: Limited Echo, Limited Color Doppler and Cardiac Doppler Indications:     atrial fibrillation  History:         Patient has no prior history of Echocardiogram examinations.  Sonographer:     Johny Chess Referring Phys:  [id number] Leanor Kail Diagnosing Phys: Loralie Champagne MD IMPRESSIONS  1. Left ventricular ejection fraction, by estimation, is 60 to 65%. The left ventricle has normal function. There is mild left ventricular hypertrophy. Left ventricular diastolic parameters are indeterminate.  2. Right ventricular systolic function is normal. The right ventricular size is mildly enlarged. There is mildly elevated pulmonary artery systolic pressure. The estimated right ventricular systolic pressure is 94.5 mmHg. Mildly D-shaped interventricular septum suggests elevated RV pressure.  3. Left atrial size was moderately dilated.  4. Right atrial size was moderately dilated.  5. The mitral valve is normal in structure. Trivial mitral valve regurgitation. No evidence of mitral stenosis.  6. The aortic valve is tricuspid. Aortic valve regurgitation is not visualized. Mild aortic valve sclerosis is present, with no evidence of aortic valve stenosis.  7. The inferior vena cava is dilated in size with <50% respiratory variability, suggesting right atrial pressure of 15 mmHg.  8. Significant left pleural effusion. FINDINGS  Left Ventricle: Left ventricular ejection fraction, by estimation, is 60 to 65%. The left ventricle has normal function. The left ventricular internal cavity size was normal in size. There is mild left ventricular hypertrophy. Left ventricular diastolic  parameters are indeterminate.  Right Ventricle: The right ventricular size is mildly enlarged. No increase in right ventricular wall thickness. Right ventricular systolic function is normal. There is mildly elevated pulmonary artery systolic pressure. The tricuspid regurgitant velocity is 2.55 m/s, and with an assumed right atrial pressure of 15 mmHg, the estimated right ventricular systolic pressure is 85.9 mmHg. Left Atrium: Left atrial size was moderately dilated. Right Atrium: Right atrial size was moderately dilated. Pericardium: Significant left pleural effusion. Trivial pericardial effusion is present. Mitral Valve: The mitral valve is normal in structure. Trivial mitral valve regurgitation. No evidence of mitral  valve stenosis. Tricuspid Valve: The tricuspid valve is normal in structure. Tricuspid valve regurgitation is mild. Aortic Valve: The aortic valve is tricuspid. Aortic valve regurgitation is not visualized. Mild aortic valve sclerosis is present, with no evidence of aortic valve stenosis. Venous: The inferior vena cava is dilated in size with less than 50% respiratory variability, suggesting right atrial pressure of 15 mmHg. IAS/Shunts: No atrial level shunt detected by color flow Doppler. LEFT VENTRICLE PLAX 2D LVIDd:         4.50 cm LVIDs:         3.40 cm LV PW:         1.10 cm LV IVS:        0.70 cm  IVC IVC diam: 2.30 cm LEFT ATRIUM         Index LA diam:    4.30 cm 2.20 cm/m  TRICUSPID VALVE TR Peak grad:   26.0 mmHg TR Vmax:        255.00 cm/s Loralie Champagne MD Electronically signed by Loralie Champagne MD Signature Date/Time: 07/12/2020/4:21:33 PM    Final (Updated)     Patient Profile     64 y.o. male with past medical history of metastatic prostate cancer, persistent atrial fibrillation admitted with acute on chronic diastolic congestive heart failure.  Echocardiogram October 2021 showed ejection fraction 50 to 55%, D-shaped septum consistent with right ventricular volume overload, mild RV dysfunction, mild right  ventricular enlargement, mild biatrial enlargement, large left pleural effusion, mild mitral regurgitation, mild to moderate tricuspid regurgitation and positive microcavitation study consistent with PFO.  Chest x-ray shows moderate bilateral pleural effusions.  Note patient previously found to have exudative pleural effusions.  Assessment & Plan    1 acute on chronic diastolic/right heart failure-Wt 72 kg; I/O-[id number].  Patient remains volume overloaded.  Continue Lasix at present dose.  Follow renal function.  2 permanent atrial fibrillation-heart rate appears to be controlled.  Continue Cardizem, digoxin and metoprolol at present dose.  Continue apixaban.  3 pleural effusions-previously found to be exudative.  He also has a history of metastatic prostate cancer.  May need repeat thoracentesis but will follow with diuresis.  4 pulmonary hypertension/RV failure-we will continue diuresis.  Previous CTA showed no pulmonary embolus.  May need VQ scan to rule out chronic pulmonary emboli.  5 patent foramen ovale  6 metastatic prostate cancer  For questions or updates, please contact [place] Please consult [email or web address] for contact info under        Signed, Kirk Ruths, MD  07/13/2020, 8:26 AM

## 2020-07-13 NOTE — Evaluation (Signed)
Physical Therapy Evaluation Patient Details Name: Chris Morales MRN: [id number] DOB: 06/22/1956 Today's Date: 07/13/2020   History of Present Illness  64 year old male admitted from CIR due to SOB and LE swelling. 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 Pt adm with afib with rvr, acute hypoxic respiratory failure likely secondary to malignant bil pleural effusions. Pt had recently been hospitalized in [place] with afib with rvr and resp failure requiring vent support and then left AMA before flying back to [facility] and presenting immediately to ED. 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 MRI - thickening and enhancement along the posterior falx. New hyperintensity in R parietal lobe that may reflect associated edema.  PMH - prostate CA with bony mets, C3-4 laminectomy with tumor removal 01/2019, afib  Clinical Impression  Pt admitted from CIR with above diagnosis. Pt was to be discharged Sunday 11/14 from CIR to go home with brother and sister in law. Pt presented at mod I level with mild balance deficits noted during ambulation. Pt unaware of deficits when discussed. Standing strengthening and balance HEP handout given and recommending OPPT to address deficits in balance following discharge. Pt will benefit from skilled PT to address additional high level balance activities and perform HEP exercises while in the acute setting.     Follow Up Recommendations Outpatient PT    Equipment Recommendations  None recommended by PT    Recommendations for Other Services       Precautions / Restrictions Precautions Precautions: Other (comment) Precaution Comments: Edema in BLE, RTC injury R shoulder Restrictions Weight Bearing Restrictions: No      Mobility  Bed Mobility Overal bed mobility: Modified  Independent Bed Mobility: Supine to Sit;Sit to Supine Rolling: Modified independent (Device/Increase time) Sidelying to sit: Modified independent (Device/Increase time)            Transfers Overall transfer level: Modified independent Equipment used: None Transfers: Sit to/from Stand Sit to Stand: Modified independent (Device/Increase time)            Ambulation/Gait Ambulation/Gait assistance: Modified independent (Device/Increase time) Gait Distance (Feet): 250 Feet Assistive device: None       General Gait Details: decreased velocity; pt able to ambulate mod I; pt with improved trunk rotation and arm swing with therapist providing verbal cueing; pt performed higher level balance with head movement with LOB noted but able to regain balance with stepping reation  Stairs            Wheelchair Mobility    Modified Rankin (Stroke Patients Only)       Balance Overall balance assessment: Mild deficits observed, not formally tested                             High Level Balance Comments: higher level balance during ambulation demonstrating deficits; pt with 2 LOB with head movement laterally and vertically during ambulation, able to regain with stepping reaction; pt states his balance is worse             Pertinent Vitals/Pain Pain Assessment: No/denies pain    Home Living Family/patient expects to be discharged to:: Private residence Living Arrangements: Alone Available Help at Discharge: Family;Available 24 hours/day Type of Home: House Home Access: Stairs to enter     Home Layout: Able to live on main level with  bedroom/bathroom Home Equipment: None Additional Comments: to stay with brother in [place] at discharge with no stairs to enter and will be on the main level    Prior Function Level of Independence: Independent         Comments: works as a Arts administrator   Dominant Hand: Right    Extremity/Trunk Assessment    Upper Extremity Assessment Upper Extremity Assessment: Defer to OT evaluation    Lower Extremity Assessment Lower Extremity Assessment: Overall WFL for tasks assessed       Communication   Communication: No difficulties  Cognition Arousal/Alertness: Awake/alert Behavior During Therapy: WFL for tasks assessed/performed Overall Cognitive Status: Within Functional Limits for tasks assessed Area of Impairment: Problem solving                       Following Commands: Follows one step commands consistently Safety/Judgement: Decreased awareness of deficits            General Comments General comments (skin integrity, edema, etc.): pt given standing exercise handout with demonstration and increased education to perform with UE support to decrease fall risk; pt verbalized understanding    Exercises     Assessment/Plan    PT Assessment Patient needs continued PT services  PT Problem List Decreased balance       PT Treatment Interventions Balance training    PT Goals (Current goals can be found in the Care Plan section)  Acute Rehab PT Goals Patient Stated Goal: to go home PT Goal Formulation: With patient Time For Goal Achievement: 07/27/20 Potential to Achieve Goals: Good    Frequency Min 2X/week   Barriers to discharge        Co-evaluation               AM-PAC PT "6 Clicks" Mobility  Outcome Measure Help needed turning from your back to your side while in a flat bed without using bedrails?: None Help needed moving from lying on your back to sitting on the side of a flat bed without using bedrails?: None Help needed moving to and from a bed to a chair (including a wheelchair)?: None Help needed standing up from a chair using your arms (e.g., wheelchair or bedside chair)?: None Help needed to walk in hospital room?: None Help needed climbing 3-5 steps with a railing? : A Little 6 Click Score: 23    End of Session Equipment Utilized During  Treatment: Gait belt Activity Tolerance: Patient tolerated treatment well Patient left: in chair;with call bell/phone within reach Nurse Communication: Mobility status PT Visit Diagnosis: Unsteadiness on feet (R26.81)    Time: 0786-7544 PT Time Calculation (min) (ACUTE ONLY): 21 min   Charges:   PT Evaluation $PT Eval Low Complexity: 1 Low          Lyanne Co, DPT Acute Rehabilitation Services [phone]  [place] 07/13/2020, 9:56 AM

## 2020-07-14 DIAGNOSIS — I9589 Other hypotension: Secondary | ICD-10-CM

## 2020-07-14 DIAGNOSIS — I5033 Acute on chronic diastolic (congestive) heart failure: Principal | ICD-10-CM

## 2020-07-14 LAB — BASIC METABOLIC PANEL
Anion gap: 8 (ref 5–15)
BUN: 18 mg/dL (ref 8–23)
CO2: 27 mmol/L (ref 22–32)
Calcium: 8.5 mg/dL — ABNORMAL LOW (ref 8.9–10.3)
Chloride: 101 mmol/L (ref 98–111)
Creatinine, Ser: 0.69 mg/dL (ref 0.61–1.24)
GFR, Estimated: >60 mL/min (ref >60)
Glucose, Bld: 81 mg/dL (ref 70–99)
Potassium: 4.1 mmol/L (ref 3.5–5.1)
Sodium: 136 mmol/L (ref 135–145)

## 2020-07-14 LAB — CBC
HCT: 31.7 % — ABNORMAL LOW (ref 39.0–52.0)
Hemoglobin: 9.4 g/dL — ABNORMAL LOW (ref 13.0–17.0)
MCH: 28.7 pg (ref 26.0–34.0)
MCHC: 29.7 g/dL — ABNORMAL LOW (ref 30.0–36.0)
MCV: 96.6 fL (ref 80.0–100.0)
Platelets: 275 10*3/uL (ref 150–400)
RBC: 3.28 MIL/uL — ABNORMAL LOW (ref 4.22–5.81)
RDW: 23 % — ABNORMAL HIGH (ref 11.5–15.5)
WBC: 4.3 10*3/uL (ref 4.0–10.5)
nRBC: 1.6 % — ABNORMAL HIGH (ref 0.0–0.2)

## 2020-07-14 NOTE — Progress Notes (Signed)
   07/14/20 0938  Assess: MEWS Score  Temp 97.8 F (36.6 C)  BP (!) 89/56  Pulse Rate 80  Resp 16  Level of Consciousness Alert  Assess: MEWS Score  MEWS Temp 0  MEWS Systolic 1  MEWS Pulse 0  MEWS RR 0  MEWS LOC 0  MEWS Score 1  MEWS Score Color Green  Assess: if the MEWS score is Yellow or Red  Were vital signs taken at a resting state? Yes  Focused Assessment No change from prior assessment  Early Detection of Sepsis Score *See Row Information* Low  MEWS guidelines implemented *See Row Information* No, vital signs rechecked

## 2020-07-14 NOTE — Progress Notes (Addendum)
[facility]  PROGRESS NOTE  Chris Morales LGX:[id number] DOB: October 14, 1955 DOA: 07/12/2020 PCP: Patient, No Pcp Per Admit date - 07/12/2020   Admitting Physician Charlann Lange, MD  Outpatient Primary MD for the patient is Patient, No Pcp Per  LOS - 2 Brief Narrative  Chris Morales is a 64 year old male with medical history significant for metastatic prostate cancer, persistent A. fib, chronic diastolic CHF with recent hospitalization on 81/44-81/8 for diastolic CHF exacerbation exudative bilateral effusions requiring thoracentesis (10/28, 10/29) and metastatic prostate cancer, with bony metastatic disease and concern for dural based metastasis who has been receiving inpatient rehabilitation embolus, inpatient rehab since 11/5.  Patient was seen by cardiology on 11/12 for evaluation at 8 PM and was noted to be significantly volume overloaded with high concern for acute exacerbation of diastolic heart failure with need for IV diuresis.  Patient was readmitted to [facility] on 11/12 by [facility] service.  Regarding his diastolic exacerbation patient is still significantly volume overloaded with ongoing dyspnea and atrial fibrillation with RVR, further complicated by hypoalbuminemia.  Patient also has moderate pulmonary hypertension with signs of right ventricular failure but unclear etiology.  CTA chest from earlier hospitalization was negative for PE.  Venous duplex negative for DVTs   Subjective  Today he says he feels a little short of breath at rest.  Denies any chest pain.  No cough.  A & P   Acute on chronic CHF with preserved ejection fraction exacerbation.  Repeat TTE on 11/13 showed preserved EF of 60-65%.  Patient remains significantly overloaded with 3+ pitting edema of the lower extremities to mid thigh, ongoing dyspnea at rest, weight gain,elevated BNP that will require continue IV diuresis.  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 No PE on last CTA. No hx of OSA. No prior lung comorbidities.  Venous duplex with no DVTs --Considering V/q scan to rule out chronic PE  Hypotension, likely related to RV failure resulting in decreased cardiac output. SBP stable in 90s-100.  -tolerating IV lasix and toprol, closely monitor  Chronic atrial fibrillation, currently rate controlled.  Had intermittent RVR to 115, but now rate controlled 70s to 80s -Continue diltiazem, digoxin, Toprol, Eliquis -Monitor on telemetry  Bilateral pleural effusions, stable.  Previously exudative based on thoracentesis on 10/28, 10/29.  Cytology at that time was negative for malignancy.  CT chest on 11/13. showed small to moderate-sized bilateral pleural effusions with dependent bilateral lower lobe collapse/consolidation.  Has ongoing dyspnea with no current O2 requirements and normal oxygen saturation -monitor dyspnea and see if IV diuresis improves it -if O2 requirements increased or dyspnea does not improve with diuresis would likely warrant repeat thoracentesis, will monitor/statements with  Prostate cancer with widespread bony metastases.  Elevated alk phos.  History of C4/C5/C6 laminectomies for resection previous dorsal epidural metastatic tumor MRI of the brain more concerning for meningioma per neurology status post Lupron injection last hospitalization -Tolerating Casodex per oncology -Recommend repeat scan in 2 months to be reviewed by CNS tumor board per oncology -Follow-up with Dr. [name] on 07/17/2020 -Dr. [name] on Monday for nephrectomy for Lupron injections  Hypothyroidism, stable -Continue Synthroid 25 mcg  Family Communication  :  none Code Status : FULL  Disposition Plan  :  Patient is from home. Anticipated d/c date: 2 to 3 days. Barriers to d/c or necessity for inpatient status:  IV lasix for CHF exacerbation as he is signficantly volume  overloaded with ongoing dyspnea  Consults  :  Cardiology  Procedures  :  none  DVT Prophylaxis  :  Eliquis  MDM: The below labs and imaging reports were reviewed and summarized above.  Medication management as above.  Lab Results  Component Value Date   PLT 275 07/14/2020    Diet :  Diet Order            Diet Heart Room service appropriate? Yes; Fluid consistency: Thin  Diet effective now                  Inpatient Medications Scheduled Meds: . apixaban  5 mg Oral BID  . bicalutamide  50 mg Oral Daily  . digoxin  0.125 mg Oral Daily  . diltiazem  120 mg Oral Daily  . folic acid  1 mg Oral Daily  . furosemide  60 mg Intravenous BID  . levothyroxine  25 mcg Oral QAC breakfast  . metoprolol succinate  100 mg Oral BID   Continuous Infusions: PRN Meds:.acetaminophen, ondansetron **OR** ondansetron (ZOFRAN) IV, polyethylene glycol  Antibiotics  :   Anti-infectives (From admission, onward)   None       Objective   Vitals:   07/14/20 0827 07/14/20 0938 07/14/20 1010 07/14/20 1116  BP: 106/77 (!) 89/56 (!) 89/56 92/60  Pulse: (!) 115 80 80 78  Resp: $Remo'18 16 16 20  'jKCiV$ Temp: (!) 97.4 F (36.3 C) 97.8 F (36.6 C) 97.8 F (36.6 C) 98.1 F (36.7 C)  TempSrc:   Oral Oral  SpO2:   98% 100%  Weight:      Height:        SpO2: 100 %  Wt Readings from Last 3 Encounters:  07/14/20 69.6 kg  07/06/20 72.1 kg  07/05/20 70.6 kg     Intake/Output Summary (Last 24 hours) at 07/14/2020 1147 Last data filed at 07/14/2020 1000 Gross per 24 hour  Intake 240 ml  Output 4520 ml  Net -4280 ml    Physical Exam:     Awake Alert, Oriented X 3, Normal affect, no distress No new F.N deficits,  [place].AT, Irregularly irregular rhythm, normal rate, no appreciable murmurs Normal respiratory effort on room air, decreased breath sounds at bases, crackles present,  2+ pitting edema of bilateral lower extremities to mid thigh +ve B.Sounds, Abd Soft, No tenderness, No rebound,  guarding or rigidity. No Cyanosis, No new Rash or bruise     I have personally reviewed the following:   Data Reviewed:  CBC Recent Labs  Lab 07/08/20 0757 07/12/20 1512 07/13/20 0447 07/14/20 0757  WBC 5.3 5.6 5.7 4.3  HGB 9.7* 9.1* 9.0* 9.4*  HCT 31.8* 30.9* 30.3* 31.7*  PLT 326 290 293 275  MCV 94.4 98.7 96.2 96.6  MCH 28.8 29.1 28.6 28.7  MCHC 30.5 29.4* 29.7* 29.7*  RDW 22.5* 23.7* 23.1* 23.0*  LYMPHSABS 1.3  --   --   --   MONOABS 0.4  --   --   --   EOSABS 0.1  --   --   --   BASOSABS 0.1  --   --   --     Chemistries  Recent Labs  Lab 07/08/20 0757 07/12/20 1512 07/13/20 0447 07/14/20 0757  NA 139 139 137 136  K 4.3 4.3 4.0 4.1  CL 108 106 102 101  CO2 23 21* 29 27  GLUCOSE 133* 103* 94 81  BUN  $'16 19 22 18  'J$ CREATININE 0.75 0.87 0.79 0.69  CALCIUM 8.1* 8.1* 8.4* 8.5*  AST $Re'18 17 16  'VwU$ --   ALT $Re'21 17 17  'nVy$ --   ALKPHOS 2,559* 2,412* 2,382*  --   BILITOT 0.8 0.9 1.1  --    ------------------------------------------------------------------------------------------------------------------ No results for input(s): CHOL, HDL, LDLCALC, TRIG, CHOLHDL, LDLDIRECT in the last 72 hours.  Lab Results  Component Value Date   HGBA1C 5.8 (H) 06/25/2020   ------------------------------------------------------------------------------------------------------------------ Recent Labs    07/13/20 0447  TSH 5.022*   ------------------------------------------------------------------------------------------------------------------ No results for input(s): VITAMINB12, FOLATE, FERRITIN, TIBC, IRON, RETICCTPCT in the last 72 hours.  Coagulation profile Recent Labs  Lab 07/13/20 0447  INR 1.8*    No results for input(s): DDIMER in the last 72 hours.  Cardiac Enzymes No results for input(s): CKMB, TROPONINI, MYOGLOBIN in the last 168 hours.  Invalid input(s):  CK ------------------------------------------------------------------------------------------------------------------    Component Value Date/Time   BNP 271.0 (H) 07/12/2020 1512    Micro Results No results found for this or any previous visit (from the past 240 hour(s)).  Radiology Reports DG Chest 1 View  Result Date: 07/12/2020 CLINICAL DATA:  Shortness of breath EXAM: CHEST  1 VIEW COMPARISON:  July 01, 2020.  History of prostate carcinoma FINDINGS: There is airspace consolidation in the left base region. Lungs elsewhere clear. Heart is upper normal in size with pulmonary vascularity normal. No adenopathy. Sclerotic bony metastases noted throughout the thoracic region. IMPRESSION: Airspace opacity left base consistent with pneumonia. Lungs elsewhere clear. Heart upper normal in size. Widespread bony metastases noted throughout the thoracic region. Electronically Signed   By: Lowella Grip III M.D.   On: 07/12/2020 14:24   DG Chest 1 View  Result Date: 06/28/2020 CLINICAL DATA:  Left thoracentesis. EXAM: CHEST  1 VIEW COMPARISON:  06/28/2020. FINDINGS: Mediastinum and hilar structures normal. Stable cardiomegaly. Bilateral interstitial prominence again noted with interim improvement from prior exam. Findings suggests improving CHF. Improved left pleural effusion. Tiny right pleural effusion. No pneumothorax post thoracentesis. IMPRESSION: Findings consistent with improving CHF. Improved left pleural effusion. No pneumothorax post thoracentesis. Tiny right pleural effusion. Electronically Signed   By: Marcello Moores  Register   On: 06/28/2020 10:55   DG Chest 1 View  Result Date: 06/27/2020 CLINICAL DATA:  Post right thoracentesis. EXAM: CHEST  1 VIEW COMPARISON:  06/24/2020 FINDINGS: Interval improvement in right pleural effusion. No pneumothorax. Improved aeration right lung base Interval progression of left pleural effusion and left lower lobe atelectasis Extensive sclerotic bony changes  compatible with metastatic prostate cancer. IMPRESSION: Improved right pleural effusion with no pneumothorax post thoracentesis Progression of left effusion and left lower lobe atelectasis. Bony metastatic disease. Electronically Signed   By: Franchot Gallo M.D.   On: 06/27/2020 12:06   DG Chest 2 View  Result Date: 07/12/2020 CLINICAL DATA:  Shortness of breath.  Metastatic prostate cancer. EXAM: CHEST - 2 VIEW COMPARISON:  07/12/2020 FINDINGS: Diffuse bony sclerosis compatible with diffuse osseous metastatic disease. Moderate bilateral pleural effusions with associated passive atelectasis. Airway thickening is present, suggesting bronchitis or reactive airways disease. Heart size within normal limits. IMPRESSION: 1. Moderate bilateral pleural effusions with associated passive atelectasis. 2. Airway thickening is present, suggesting bronchitis or reactive airways disease. 3. Diffuse sclerotic osseous metastatic disease. Electronically Signed   By: Van Clines M.D.   On: 07/12/2020 18:24   DG Shoulder Right  Result Date: 07/08/2020 CLINICAL DATA:  Shoulder weakness, bone metastasis, torn rotator cuff EXAM: RIGHT SHOULDER -  2+ VIEW COMPARISON:  Radiograph 02/20/2014 FINDINGS: A diffusely mottled sclerotic appearance of the bones is compatible with known osseous metastatic disease. There is a high-riding appearance of the humeral head which is suggestive of underlying rotator cuff insufficiency particularly in the setting of known rotator cuff tear. Moderate arthrosis of the glenohumeral and acromioclavicular joints. No acute traumatic osseous injury is present at this time. IMPRESSION: 1. Diffusely mottled sclerotic appearance of the bones compatible with known osseous metastatic disease. 2. High-riding appearance of the humeral head is suggestive of underlying rotator cuff insufficiency, particularly in the setting of known rotator cuff tear. 3. No acute fracture or traumatic malalignment.  Electronically Signed   By: Lovena Le M.D.   On: 07/08/2020 19:23   CT CHEST W CONTRAST  Result Date: 07/13/2020 CLINICAL DATA:  Pleural effusion on chest x-ray. EXAM: CT CHEST WITH CONTRAST TECHNIQUE: Multidetector CT imaging of the chest was performed during intravenous contrast administration. CONTRAST:  66mL OMNIPAQUE IOHEXOL 300 MG/ML  SOLN COMPARISON:  06/25/2020 FINDINGS: Cardiovascular: The heart size is normal. No substantial pericardial effusion. No thoracic aortic aneurysm no large central pulmonary embolus Mediastinum/Nodes: No mediastinal lymphadenopathy. Calcified nodal tissue noted in the right hilum. There is no hilar lymphadenopathy. The esophagus has normal imaging features. There is no axillary lymphadenopathy. Lungs/Pleura: Patchy areas of central ground-glass attenuation are seen in the upper lobes, right greater than left, decreased in the interval. There is dependent bilateral lower lobe collapse/consolidation with small to moderate bilateral pleural effusions. Upper Abdomen: Heterogeneous liver parenchyma is nonspecific but may be related to fatty deposition or cirrhosis. Calcified granulomata noted in the spleen. Musculoskeletal: Widespread sclerotic bone metastases again noted. IMPRESSION: 1. Patchy areas of central ground-glass attenuation in the upper lobes, right greater than left, decreased in the interval. Imaging features likely reflect improving infectious/inflammatory etiology. 2. Small to moderate bilateral pleural effusions with dependent bilateral lower lobe collapse/consolidation. 3. Widespread sclerotic bone metastases. 4. Heterogeneous liver parenchyma, nonspecific but may be related to fatty deposition or cirrhosis. Metastatic disease considered less likely but not excluded. Electronically Signed   By: Misty Stanley M.D.   On: 07/13/2020 09:23   CT ANGIO CHEST PE W OR WO CONTRAST  Result Date: 06/25/2020 CLINICAL DATA:  Worsening shortness of breath EXAM: CT  ANGIOGRAPHY CHEST WITH CONTRAST TECHNIQUE: Multidetector CT imaging of the chest was performed using the standard protocol during bolus administration of intravenous contrast. Multiplanar CT image reconstructions and MIPs were obtained to evaluate the vascular anatomy. CONTRAST:  124mL OMNIPAQUE IOHEXOL 350 MG/ML SOLN COMPARISON:  Radiograph same day FINDINGS: Cardiovascular: There is a optimal opacification of the pulmonary arteries. There is no central,segmental, or subsegmental filling defects within the pulmonary arteries. There is mild cardiomegaly present. No pericardial effusion or thickening. No evidence right heart strain. There is normal three-vessel brachiocephalic anatomy without proximal stenosis. The thoracic aorta is normal in appearance. Mediastinum/Nodes: No hilar, mediastinal, or axillary adenopathy. Thyroid gland, trachea, and esophagus demonstrate no significant findings. Lungs/Pleura: There is moderate to large bilateral pleural effusion with adjacent compressive atelectasis seen. Patchy rounded ground-glass opacities are seen predominantly within the right upper lung and right middle lobe. Upper Abdomen: No acute abnormalities present in the visualized portions of the upper abdomen. There is diffuse anasarca present. Musculoskeletal: Extensive osseous sclerotic blastic lesions are seen throughout the visualized portion of the axial and appendicular skeleton. Review of the MIP images confirms the above findings. IMPRESSION: No central, segmental, or subsegmental pulmonary embolism Moderate to large bilateral pleural effusions  with adjacent compressive atelectasis. Ground-glass opacities within the right upper lung and right middle lobe, likely due to infectious or inflammatory etiology. Extensive diffuse osseous metastatic disease. Electronically Signed   By: Prudencio Pair M.D.   On: 06/25/2020 03:11   MR BRAIN W WO CONTRAST  Result Date: 06/25/2020 CLINICAL DATA:  Left-sided weakness,  metastatic prostate cancer EXAM: MRI HEAD WITHOUT AND WITH CONTRAST TECHNIQUE: Multiplanar, multiecho pulse sequences of the brain and surrounding structures were obtained without and with intravenous contrast. CONTRAST:  50mL GADAVIST GADOBUTROL 1 MMOL/ML IV SOLN COMPARISON:  2020 FINDINGS: Brain: There is thickening and enhancement along the posterior falx measuring 5 mm in thickness. The adjacent superior sagittal sinus is uninvolved. There is new minimal adjacent T2 FLAIR hyperintensity in the right parietal lobe that may reflect associated edema. There is no acute infarction or intracranial hemorrhage. There is no parenchymal mass or mass effect. There is no hydrocephalus or extra-axial fluid collection. Prominence of the ventricles and sulci reflects minor generalized parenchymal volume loss. Patchy foci of T2 hyperintensity in the supratentorial white matter are nonspecific but probably reflect mild chronic microvascular ischemic changes. These findings have progressed since 2020. Vascular: Major vessel flow voids at the skull base are preserved. Skull and upper cervical spine: Abnormal marrow signal likely reflecting diffuse metastatic disease. Sinuses/Orbits: Chronic right maxillary sinusitis. Orbits are unremarkable. Other: Sella is unremarkable.  Bilateral mastoid effusions. IMPRESSION: No acute infarction. Thickening enhancement along the posterior falx cerebri with possible minimal associated parenchymal edema. Differential considerations include dural-based metastasis (more likely given normal appearance on prior study) and meningioma. Diffuse osseous metastatic disease. Electronically Signed   By: Macy Mis M.D.   On: 06/25/2020 08:24   MR CERVICAL SPINE W WO CONTRAST  Addendum Date: 07/05/2020   ADDENDUM REPORT: 07/05/2020 13:24 ADDENDUM: I was subsequently contacted by Dr. Curly Shores of neurology who directed my attention to subtle dural thickening and enhancement at the C5-C6 level. The dura  measures 1-2 mm in thickness at this level. This finding may be postoperative in etiology. Trace dural-based tumor is difficult to definitively exclude. Consider contrast-enhanced MRI follow-up for surveillance. No resultant significant spinal cord mass effect at this level. This addendum was discussed with Dr. Curly Shores by telephone at approximately 1:30 p.m. on 07/05/2020. Electronically Signed   By: Kellie Simmering DO   On: 07/05/2020 13:24   Result Date: 07/05/2020 CLINICAL DATA:  Myelopathy, acute or progressive; left arm weakness. Additional history provided: History of prostate cancer metastatic to bone, status post cervical laminectomy with tumor removal in June 2020. EXAM: MRI CERVICAL SPINE WITHOUT AND WITH CONTRAST TECHNIQUE: Multiplanar and multiecho pulse sequences of the cervical spine, to include the craniocervical junction and cervicothoracic junction, were obtained without and with intravenous contrast. CONTRAST:  34mL GADAVIST GADOBUTROL 1 MMOL/ML IV SOLN COMPARISON:  Radiographs of the cervical spine 02/20/2019. Cervical spine MRI 02/19/2019. Cervical spine MRI 02/20/2019. FINDINGS: Alignment: Reversal of the expected cervical lordosis. No significant spondylolisthesis. Vertebrae: Vertebral body height is maintained. Diffuse metastatic disease throughout the cervical spine. Since the prior MRI of 02/20/2019, there has been interval C4, C5 and C6 laminectomies for resection of epidural metastatic tumor and microdiscectomy. Multilevel degenerative endplate irregularity. Cord: No spinal cord signal abnormality. Posterior Fossa, vertebral arteries, paraspinal tissues: No abnormality identified within included portions of the posterior fossa. Bilateral mastoid effusions. Flow voids preserved within the imaged cervical vertebral arteries. Paraspinal soft tissues within normal limits. Unchanged nonspecific 13 mm round T2 hyperintense subcutaneous lesion within the posterior  right upper back (series 8, image  36). Disc levels: Unless otherwise stated, the level by level findings below have not significantly changed since prior MRIs of 02/20/2019 and 02/19/2019. Severe disc degeneration at C3-C4, C4-C5, C5-C6, C6-C7 and C7-T1. C2-C3: No significant disc herniation or stenosis. C3-C4: Disc bulge. Uncovertebral and facet hypertrophy. The disc bulge effaces the ventral thecal sac, contacting and minimally flattening the ventral spinal cord with overall mild relative spinal canal narrowing. Moderate/severe left neural foraminal narrowing. C4-C5: Interval laminectomies for resection of previous dorsal epidural tumor. Posterior disc osteophyte complex. Uncinate and facet hypertrophy. No significant spinal canal stenosis. Bilateral neural foraminal narrowing (mild right, moderate left). C5-C6: Interval laminectomies for resection of previous dorsal epidural tumor. Posterior disc osteophyte complex. Uncinate and facet hypertrophy. The disc osteophyte complex minimally effaces the ventral thecal sac without significant spinal canal stenosis. Mild/moderate bilateral neural foraminal narrowing. C6-C7: Posterior disc osteophyte complex. Uncinate and facet hypertrophy. Mild spinal canal stenosis. Bilateral neural foraminal narrowing (mild right, moderate left). C7-T1: This level is imaged sagittally. No significant spinal canal stenosis. Mild bilateral neural foraminal narrowing. IMPRESSION: Since the prior MRI of 02/20/2019, there has been interval C4, C5 and C6 laminectomies for resection of previous dorsal epidural metastatic tumor. No evidence of recurrent epidural tumor at this site. No convincing evidence of epidural metastatic disease within the cervical spine. 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 Cervical spondylosis as outlined and having not significantly changed from the cervical spine MRI of 02/20/2020. Severe disc degeneration throughout the majority of the cervical spine. No  more than mild spinal canal stenosis at any level. At C3-C4, a disc bulge contributes to mild relative spinal canal narrowing, contacting and minimally flattening the ventral spinal cord. Multilevel neural foraminal narrowing greatest on the left at C3-C4 (moderate/severe), on the left at C4-C5 (moderate) and on the left at C6-C7 (moderate). Electronically Signed: By: Jackey Loge DO On: 07/05/2020 11:43   DG CHEST PORT 1 VIEW  Result Date: 07/01/2020 CLINICAL DATA:  Shortness of breath, metastatic prostate carcinoma status post left thoracentesis on 06/28/2020. EXAM: PORTABLE CHEST 1 VIEW COMPARISON:  06/28/2020 FINDINGS: The heart size and mediastinal contours are within normal limits. No significant recurrence of left pleural fluid since thoracentesis with probable small pleural effusion remaining. No pneumothorax. Stable bibasilar atelectasis. Stable diffuse sclerotic metastatic disease involving the thoracic spine. IMPRESSION: No significant recurrence of left pleural fluid since thoracentesis with probable small pleural effusion remaining. Stable bibasilar atelectasis. Electronically Signed   By: Irish Lack M.D.   On: 07/01/2020 08:03   DG CHEST PORT 1 VIEW  Result Date: 06/28/2020 CLINICAL DATA:  Shortness of breath. EXAM: PORTABLE CHEST 1 VIEW COMPARISON:  06/27/2020. FINDINGS: Mediastinum and heart size normal. Progression of diffuse left lung interstitial infiltrates/edema. Diffuse right lung interstitial infiltrates/edema noted on today's exam. Persistent left-sided pleural effusion without interim change. No pneumothorax. Extensive sclerotic bony metastatic disease again noted. IMPRESSION: 1. Progression of diffuse left lung interstitial infiltrates/edema. Diffuse right lung interstitial infiltrates/edema noted on today's exam. Persistent left-sided pleural effusion without interim change. 2. Extensive sclerotic bony metastatic disease again noted. Electronically Signed   By: Maisie Fus   Register   On: 06/28/2020 05:52   DG Chest Portable 1 View  Result Date: 06/24/2020 CLINICAL DATA:  Tachycardia. EXAM: PORTABLE CHEST 1 VIEW COMPARISON:  February 19, 2019 FINDINGS: There is cardiomegaly. There are moderate to large bilateral pleural effusions. There is bibasilar atelectasis. There is diffuse sclerosis throughout the visualized osseous structures most  notably the thoracic spine. There is no pneumothorax. IMPRESSION: 1. Moderate to large bilateral pleural effusions with adjacent atelectasis. 2. Cardiomegaly. 3. Progressive sclerosis throughout the visualized osseous structures consistent with metastatic disease. Electronically Signed   By: Constance Holster M.D.   On: 06/24/2020 21:33   US LIVER DOPPLER  Result Date: 06/25/2020 CLINICAL DATA:  64 year old male with a history of liver disease EXAM: DUPLEX ULTRASOUND OF LIVER TECHNIQUE: Color and duplex Doppler ultrasound was performed to evaluate the hepatic in-flow and out-flow vessels. COMPARISON:  None. FINDINGS: Portal Vein Velocities Main:  43 cm/sec Right:  28 cm/sec Left:  18 cm/sec Hepatic Vein Velocities Right:  77 cm/sec Middle:  78 cm/sec Left:  92 cm/sec Hepatic Artery Velocity:  135 cm/sec Splenic Vein Velocity:  24 cm/sec Varices: Absent Ascites: Absent Spleen volume estimated 52 cubic cm Pleural fluid bilaterally IMPRESSION: Unremarkable duplex of the hepatic vasculature. Bilateral pleural effusions Electronically Signed   By: Corrie Mckusick D.O.   On: 06/25/2020 14:47   ECHOCARDIOGRAM COMPLETE BUBBLE STUDY  Result Date: 06/25/2020    ECHOCARDIOGRAM REPORT   Patient Name:   Chris Morales Date of Exam: 06/25/2020 Medical Rec #:  [id number]    Height:       73.0 in Accession #:    [id number]   Weight:       150.0 lb Date of Birth:  1955/09/07    BSA:          1.904 m Patient Age:    14 years     BP:           111/75 mmHg Patient Gender: M            HR:           126 bpm. Exam Location:  Inpatient Procedure: 2D Echo, Cardiac  Doppler, Color Doppler and Saline Contrast Bubble            Study Indications:    Atrial fibrillation with rapid ventricular response ([place])  History:        Patient has no prior history of Echocardiogram examinations.  Sonographer:    Bernadene Person RDCS Referring Phys: [id number] [place]  1. Left ventricular ejection fraction, by estimation, is 50 to 55%. The left ventricle has low normal function. The left ventricle has no regional wall motion abnormalities. Left ventricular diastolic parameters were normal. There is the interventricular septum is flattened in diastole ('D' shaped left ventricle), consistent with right ventricular volume overload.  2. Right ventricular systolic function is mildly reduced. The right ventricular size is mildly enlarged. There is mildly elevated pulmonary artery systolic pressure. The estimated right ventricular systolic pressure is 76.1 mmHg.  3. Left atrial size was mildly dilated.  4. Right atrial size was mildly dilated.  5. Large pleural effusion in the left lateral region.  6. The mitral valve is normal in structure. Mild mitral valve regurgitation.  7. Tricuspid valve regurgitation is mild to moderate.  8. The aortic valve is normal in structure. Aortic valve regurgitation is not visualized. Mild aortic valve sclerosis is present, with no evidence of aortic valve stenosis.  9. The inferior vena cava is dilated in size with <50% respiratory variability, suggesting right atrial pressure of 15 mmHg. 10. Evidence of atrial level shunting detected by color flow Doppler. Agitated saline contrast bubble study was positive with shunting observed within 3-6 cardiac cycles suggestive of interatrial shunt. Comparison(s): No prior Echocardiogram. Findings are suggestive of right heart volume overload due to  left-to-right shunt and there is a small right to left interatrial shunt by saline contrast study. However, an atrial septal defect could not be outlined with 2D  and color Doppler imaging. Consider TEE or CT angiography to evaluate for anormalous pulmonary vein return or occult ASD. Qp:Qs shunt fraction could not be accurately calculated due to poor quality RV outflow imaging. FINDINGS  Left Ventricle: Left ventricular ejection fraction, by estimation, is 50 to 55%. The left ventricle has low normal function. The left ventricle has no regional wall motion abnormalities. The left ventricular internal cavity size was normal in size. There is no left ventricular hypertrophy. The interventricular septum is flattened in diastole ('D' shaped left ventricle), consistent with right ventricular volume overload. Left ventricular diastolic parameters were normal. Normal left ventricular filling pressure. Right Ventricle: The right ventricular size is mildly enlarged. No increase in right ventricular wall thickness. Right ventricular systolic function is mildly reduced. There is mildly elevated pulmonary artery systolic pressure. The tricuspid regurgitant  velocity is 2.60 m/s, and with an assumed right atrial pressure of 15 mmHg, the estimated right ventricular systolic pressure is 78.9 mmHg. Left Atrium: Left atrial size was mildly dilated. Right Atrium: Right atrial size was mildly dilated. Pericardium: There is no evidence of pericardial effusion. Mitral Valve: The mitral valve is normal in structure. Mild mitral valve regurgitation. Tricuspid Valve: The tricuspid valve is grossly normal. Tricuspid valve regurgitation is mild to moderate. Aortic Valve: The aortic valve is normal in structure. Aortic valve regurgitation is not visualized. Mild aortic valve sclerosis is present, with no evidence of aortic valve stenosis. Pulmonic Valve: The pulmonic valve was not well visualized. Pulmonic valve regurgitation is not visualized. Aorta: The aortic root and ascending aorta are structurally normal, with no evidence of dilitation. Venous: The inferior vena cava is dilated in size with less  than 50% respiratory variability, suggesting right atrial pressure of 15 mmHg. IAS/Shunts: There is redundancy of the interatrial septum. Evidence of atrial level shunting detected by color flow Doppler. Agitated saline contrast was given intravenously to evaluate for intracardiac shunting. Agitated saline contrast bubble study was  positive with shunting observed within 3-6 cardiac cycles suggestive of interatrial shunt. Additional Comments: There is a large pleural effusion in the left lateral region.  LEFT VENTRICLE PLAX 2D LVIDd:         4.10 cm     Diastology LVIDs:         3.00 cm     LV e' medial:    13.40 cm/s LV PW:         1.00 cm     LV E/e' medial:  6.5 LV IVS:        0.90 cm     LV e' lateral:   9.00 cm/s LVOT diam:     2.10 cm     LV E/e' lateral: 9.7 LV SV:         68 LV SV Index:   36 LVOT Area:     3.46 cm  LV Volumes (MOD) LV vol d, MOD A2C: 63.8 ml LV vol d, MOD A4C: 58.6 ml LV vol s, MOD A2C: 32.5 ml LV vol s, MOD A4C: 27.6 ml LV SV MOD A2C:     31.3 ml LV SV MOD A4C:     58.6 ml LV SV MOD BP:      32.7 ml RIGHT VENTRICLE RV S prime:     6.85 cm/s TAPSE (M-mode): 1.7 cm LEFT ATRIUM  Index       RIGHT ATRIUM           Index LA diam:        4.00 cm 2.10 cm/m  RA Area:     21.40 cm LA Vol (A2C):   37.5 ml 19.70 ml/m RA Volume:   61.60 ml  32.35 ml/m LA Vol (A4C):   53.1 ml 27.89 ml/m LA Biplane Vol: 44.8 ml 23.53 ml/m  AORTIC VALVE             PULMONIC VALVE LVOT Vmax:   89.40 cm/s  RVOT Peak grad: 2 mmHg LVOT Vmean:  65.500 cm/s LVOT VTI:    0.197 m  AORTA Ao Root diam: 2.70 cm Ao Asc diam:  2.90 cm MITRAL VALVE               TRICUSPID VALVE MV Area (PHT): 3.77 cm    TR Peak grad:   27.0 mmHg MV Decel Time: 201 msec    TR Vmax:        260.00 cm/s MV E velocity: 87.50 cm/s MV A velocity: 90.80 cm/s  SHUNTS MV E/A ratio:  0.96        Systemic VTI:  0.20 m                            Systemic Diam: 2.10 cm                            Pulmonic VTI:  0.087 m Dani Gobble Croitoru MD  Electronically signed by Sanda Klein MD Signature Date/Time: 06/25/2020/12:54:37 PM    Final    VAS [name] LOWER EXTREMITY VENOUS (DVT)  Result Date: 07/14/2020  Lower Venous DVT Study Indications: Swelling.  Limitations: Poor ultrasound/tissue interface. Comparison Study: No prior studies. Performing Technologist: Oliver Hum RVT  Examination Guidelines: A complete evaluation includes B-mode imaging, spectral Doppler, color Doppler, and power Doppler as needed of all accessible portions of each vessel. Bilateral testing is considered an integral part of a complete examination. Limited examinations for reoccurring indications may be performed as noted. The reflux portion of the exam is performed with the patient in reverse Trendelenburg.  +---------+---------------+---------+-----------+----------+--------------+ RIGHT    CompressibilityPhasicitySpontaneityPropertiesThrombus Aging +---------+---------------+---------+-----------+----------+--------------+ CFV      Full           Yes      Yes                                 +---------+---------------+---------+-----------+----------+--------------+ SFJ      Full                                                        +---------+---------------+---------+-----------+----------+--------------+ FV Prox  Full                                                        +---------+---------------+---------+-----------+----------+--------------+ FV Mid   Full                                                        +---------+---------------+---------+-----------+----------+--------------+  FV DistalFull                                                        +---------+---------------+---------+-----------+----------+--------------+ PFV      Full                                                        +---------+---------------+---------+-----------+----------+--------------+ POP      Full           Yes      Yes                                  +---------+---------------+---------+-----------+----------+--------------+ PTV      Full                                                        +---------+---------------+---------+-----------+----------+--------------+ PERO     Full                                                        +---------+---------------+---------+-----------+----------+--------------+   +---------+---------------+---------+-----------+----------+--------------+ LEFT     CompressibilityPhasicitySpontaneityPropertiesThrombus Aging +---------+---------------+---------+-----------+----------+--------------+ CFV      Full           Yes      Yes                                 +---------+---------------+---------+-----------+----------+--------------+ SFJ      Full                                                        +---------+---------------+---------+-----------+----------+--------------+ FV Prox  Full                                                        +---------+---------------+---------+-----------+----------+--------------+ FV Mid   Full                                                        +---------+---------------+---------+-----------+----------+--------------+ FV DistalFull                                                        +---------+---------------+---------+-----------+----------+--------------+  PFV      Full                                                        +---------+---------------+---------+-----------+----------+--------------+ POP      Full           Yes      Yes                                 +---------+---------------+---------+-----------+----------+--------------+ PTV      Full                                                        +---------+---------------+---------+-----------+----------+--------------+ PERO     Full                                                         +---------+---------------+---------+-----------+----------+--------------+     Summary: RIGHT: - There is no evidence of deep vein thrombosis in the lower extremity.  - No cystic structure found in the popliteal fossa.  LEFT: - There is no evidence of deep vein thrombosis in the lower extremity.  - No cystic structure found in the popliteal fossa.  *See table(s) above for measurements and observations. Electronically signed by Ruta Hinds MD on 07/14/2020 at 10:12:25 AM.    Final    ECHOCARDIOGRAM LIMITED  Result Date: 07/12/2020    ECHOCARDIOGRAM LIMITED REPORT   Patient Name:   Chris Morales Date of Exam: 07/12/2020 Medical Rec #:  [id number]    Height:       73.0 in Accession #:    [id number]   Weight:       159.0 lb Date of Birth:  08-May-1956    BSA:          1.951 m Patient Age:    21 years     BP:           116/65 mmHg Patient Gender: M            HR:           87 bpm. Exam Location:  Inpatient Procedure: Limited Echo, Limited Color Doppler and Cardiac Doppler Indications:     atrial fibrillation  History:         Patient has no prior history of Echocardiogram examinations.  Sonographer:     Johny Chess Referring Phys:  [id number] Leanor Kail Diagnosing Phys: Loralie Champagne MD IMPRESSIONS  1. Left ventricular ejection fraction, by estimation, is 60 to 65%. The left ventricle has normal function. There is mild left ventricular hypertrophy. Left ventricular diastolic parameters are indeterminate.  2. Right ventricular systolic function is normal. The right ventricular size is mildly enlarged. There is mildly elevated pulmonary artery systolic pressure. The estimated right ventricular systolic pressure is 00.3 mmHg. Mildly D-shaped interventricular septum suggests elevated RV pressure.  3. Left atrial size was moderately dilated.  4. Right atrial size was moderately dilated.  5. The mitral valve is normal in structure. Trivial mitral valve regurgitation. No evidence of mitral stenosis.  6. The  aortic valve is tricuspid. Aortic valve regurgitation is not visualized. Mild aortic valve sclerosis is present, with no evidence of aortic valve stenosis.  7. The inferior vena cava is dilated in size with <50% respiratory variability, suggesting right atrial pressure of 15 mmHg.  8. Significant left pleural effusion. FINDINGS  Left Ventricle: Left ventricular ejection fraction, by estimation, is 60 to 65%. The left ventricle has normal function. The left ventricular internal cavity size was normal in size. There is mild left ventricular hypertrophy. Left ventricular diastolic  parameters are indeterminate. Right Ventricle: The right ventricular size is mildly enlarged. No increase in right ventricular wall thickness. Right ventricular systolic function is normal. There is mildly elevated pulmonary artery systolic pressure. The tricuspid regurgitant velocity is 2.55 m/s, and with an assumed right atrial pressure of 15 mmHg, the estimated right ventricular systolic pressure is 03.8 mmHg. Left Atrium: Left atrial size was moderately dilated. Right Atrium: Right atrial size was moderately dilated. Pericardium: Significant left pleural effusion. Trivial pericardial effusion is present. Mitral Valve: The mitral valve is normal in structure. Trivial mitral valve regurgitation. No evidence of mitral valve stenosis. Tricuspid Valve: The tricuspid valve is normal in structure. Tricuspid valve regurgitation is mild. Aortic Valve: The aortic valve is tricuspid. Aortic valve regurgitation is not visualized. Mild aortic valve sclerosis is present, with no evidence of aortic valve stenosis. Venous: The inferior vena cava is dilated in size with less than 50% respiratory variability, suggesting right atrial pressure of 15 mmHg. IAS/Shunts: No atrial level shunt detected by color flow Doppler. LEFT VENTRICLE PLAX 2D LVIDd:         4.50 cm LVIDs:         3.40 cm LV PW:         1.10 cm LV IVS:        0.70 cm  IVC IVC diam: 2.30 cm  LEFT ATRIUM         Index LA diam:    4.30 cm 2.20 cm/m  TRICUSPID VALVE TR Peak grad:   26.0 mmHg TR Vmax:        255.00 cm/s Loralie Champagne MD Electronically signed by Loralie Champagne MD Signature Date/Time: 07/12/2020/4:21:33 PM    Final (Updated)    IR THORACENTESIS ASP PLEURAL SPACE W/IMG GUIDE  Result Date: 06/28/2020 INDICATION: Patient with a history of prostate cancer with extensive metastases now has new onset pleural effusions. Interventional radiology asked to perform a therapeutic and diagnostic thoracentesis. EXAM: ULTRASOUND GUIDED THORACENTESIS MEDICATIONS: 1% lidocaine 10 mL COMPLICATIONS: None immediate. PROCEDURE: An ultrasound guided thoracentesis was thoroughly discussed with the patient and questions answered. The benefits, risks, alternatives and complications were also discussed. The patient understands and wishes to proceed with the procedure. Written consent was obtained. Ultrasound was performed to localize and mark an adequate pocket of fluid in the left chest. The area was then prepped and draped in the normal sterile fashion. 1% Lidocaine was used for local anesthesia. Under ultrasound guidance a 6 Fr Safe-T-Centesis catheter was introduced. Thoracentesis was performed. The catheter was removed and a dressing applied. FINDINGS: A total of approximately 1.3 L of bright yellow fluid was removed. Samples were sent to the laboratory as requested by the clinical team. IMPRESSION: Successful ultrasound guided left thoracentesis yielding 1.3 L of pleural fluid. Read by: Soyla Dryer, NP Electronically Signed   By: Corrie Mckusick D.O.  On: 06/28/2020 12:11   IR THORACENTESIS ASP PLEURAL SPACE W/IMG GUIDE  Result Date: 06/27/2020 INDICATION: Patient with a history of prostate cancer with extensive metastases now has new onset pleural effusions. Interventional radiology asked to perform a therapeutic and diagnostic thoracentesis. EXAM: ULTRASOUND GUIDED THORACENTESIS MEDICATIONS: 1%  lidocaine 20 mL COMPLICATIONS: None immediate. PROCEDURE: An ultrasound guided thoracentesis was thoroughly discussed with the patient and questions answered. The benefits, risks, alternatives and complications were also discussed. The patient understands and wishes to proceed with the procedure. Written consent was obtained. Ultrasound was performed to localize and mark an adequate pocket of fluid in the right chest. The area was then prepped and draped in the normal sterile fashion. 1% Lidocaine was used for local anesthesia. Under ultrasound guidance a 6 Fr Safe-T-Centesis catheter was introduced. Thoracentesis was performed. The catheter was removed and a dressing applied. FINDINGS: A total of approximately 1.6 L of dark yellow fluid was removed. Samples were sent to the laboratory as requested by the clinical team. IMPRESSION: Successful ultrasound guided right thoracentesis yielding 1.6 L of pleural fluid. Read by: Soyla Dryer, NP Electronically Signed   By: Markus Daft M.D.   On: 06/27/2020 12:29     Time Spent in minutes  30     Desiree Hane M.D on 07/14/2020 at 11:47 AM  To page go to [email or web address] - password [facility]

## 2020-07-14 NOTE — Progress Notes (Signed)
   07/14/20 0827  Assess: MEWS Score  Temp (!) 97.4 F (36.3 C)  BP 106/77  Pulse Rate (!) 115  ECG Heart Rate 61  Resp 18  Level of Consciousness Alert  Assess: MEWS Score  MEWS Temp 0  MEWS Systolic 0  MEWS Pulse 0  MEWS RR 0  MEWS LOC 0  MEWS Score 0  MEWS Score Color Green  Assess: if the MEWS score is Yellow or Red  Were vital signs taken at a resting state? Yes  Focused Assessment No change from prior assessment  Early Detection of Sepsis Score *See Row Information* Low  MEWS guidelines implemented *See Row Information* Yes

## 2020-07-14 NOTE — Plan of Care (Signed)
  Problem: Education: Goal: Ability to demonstrate management of disease process will improve Outcome: Progressing Goal: Ability to verbalize understanding of medication therapies will improve Outcome: Progressing   

## 2020-07-14 NOTE — Progress Notes (Signed)
Progress Note  Patient Name: Chris Morales Date of Encounter: 07/15/2020  [place] Cardiologist: No primary care provider on file.   Subjective  Patient states his SOB is improving but not back to baseline. LE improving but persistent mainly in the LLE. Doppler u/s negative for DVT. Asking when he will be able to be discharged.  Net negative 1.7 with 2.85L UOP Wt 153-->147  Inpatient Medications    Scheduled Meds: . apixaban  5 mg Oral BID  . bicalutamide  50 mg Oral Daily  . digoxin  0.125 mg Oral Daily  . diltiazem  120 mg Oral Daily  . folic acid  1 mg Oral Daily  . furosemide  60 mg Intravenous BID  . levothyroxine  25 mcg Oral QAC breakfast  . metoprolol succinate  100 mg Oral BID   Continuous Infusions:  PRN Meds: acetaminophen, ondansetron **OR** ondansetron (ZOFRAN) IV, polyethylene glycol   Vital Signs    Vitals:   07/15/20 0100 07/15/20 0300 07/15/20 0459 07/15/20 0730  BP: (!) 100/56  100/83 98/66  Pulse: (!) 59  (!) 58 (!) 58  Resp: 20  20 18   Temp: 98 F (36.7 C)  98 F (36.7 C) 98.1 F (36.7 C)  TempSrc: Oral  Oral Oral  SpO2: 100%  100% 100%  Weight:  66.9 kg    Height:        Intake/Output Summary (Last 24 hours) at 07/15/2020 0827 Last data filed at 07/15/2020 0732 Gross per 24 hour  Intake 1180 ml  Output 3125 ml  Net -1945 ml   Last 3 Weights 07/15/2020 07/14/2020 07/13/2020  Weight (lbs) 147 lb 8 oz 153 lb 8 oz 158 lb 11.7 oz  Weight (kg) 66.906 kg 69.627 kg 72 kg      Telemetry    Afib/aflutter rate controlled- Personally Reviewed  ECG    No new ECG - Personally Reviewed  Physical Exam   GEN: No acute distress. Sitting up in a chair. Neck: +JVD Cardiac: Bradycardic, regular, no gallops Respiratory: Clear to auscultation bilaterally. Good air movement GI: Soft, nontender, non-distended  MS: 2+ pitting edema with L>>R Neuro:  Nonfocal  Psych: Normal affect   Labs    High Sensitivity Troponin:   Recent Labs   Lab 06/25/20 1546 07/12/20 1730 07/12/20 1900  TROPONINIHS 15 17 19*      Chemistry Recent Labs  Lab 07/12/20 1512 07/12/20 1512 07/13/20 0447 07/14/20 0757 07/15/20 0554  NA 139   < > 137 136 137  K 4.3   < > 4.0 4.1 3.8  CL 106   < > 102 101 101  CO2 21*   < > 29 27 26   GLUCOSE 103*   < > 94 81 92  BUN 19   < > 22 18 22   CREATININE 0.87   < > 0.79 0.69 0.73  CALCIUM 8.1*   < > 8.4* 8.5* 8.3*  PROT 5.8*  --  5.9*  --   --   ALBUMIN 2.9*  --  2.8*  --   --   AST 17  --  16  --   --   ALT 17  --  17  --   --   ALKPHOS 2,412*  --  2,382*  --   --   BILITOT 0.9  --  1.1  --   --   GFRNONAA >60   < > >60 >60 >60  ANIONGAP 12   < > 6 8 10    < > =  values in this interval not displayed.     Hematology Recent Labs  Lab 07/13/20 0447 07/14/20 0757 07/15/20 0554  WBC 5.7 4.3 4.2  RBC 3.15* 3.28* 2.92*  HGB 9.0* 9.4* 8.5*  HCT 30.3* 31.7* 28.2*  MCV 96.2 96.6 96.6  MCH 28.6 28.7 29.1  MCHC 29.7* 29.7* 30.1  RDW 23.1* 23.0* 22.7*  PLT 293 275 277    BNP Recent Labs  Lab 07/12/20 1512  BNP 271.0*     DDimer No results for input(s): DDIMER in the last 168 hours.   Radiology    CT CHEST W CONTRAST  Result Date: 07/13/2020 CLINICAL DATA:  Pleural effusion on chest x-ray. EXAM: CT CHEST WITH CONTRAST TECHNIQUE: Multidetector CT imaging of the chest was performed during intravenous contrast administration. CONTRAST:  18mL OMNIPAQUE IOHEXOL 300 MG/ML  SOLN COMPARISON:  06/25/2020 FINDINGS: Cardiovascular: The heart size is normal. No substantial pericardial effusion. No thoracic aortic aneurysm no large central pulmonary embolus Mediastinum/Nodes: No mediastinal lymphadenopathy. Calcified nodal tissue noted in the right hilum. There is no hilar lymphadenopathy. The esophagus has normal imaging features. There is no axillary lymphadenopathy. Lungs/Pleura: Patchy areas of central ground-glass attenuation are seen in the upper lobes, right greater than left, decreased in  the interval. There is dependent bilateral lower lobe collapse/consolidation with small to moderate bilateral pleural effusions. Upper Abdomen: Heterogeneous liver parenchyma is nonspecific but may be related to fatty deposition or cirrhosis. Calcified granulomata noted in the spleen. Musculoskeletal: Widespread sclerotic bone metastases again noted. IMPRESSION: 1. Patchy areas of central ground-glass attenuation in the upper lobes, right greater than left, decreased in the interval. Imaging features likely reflect improving infectious/inflammatory etiology. 2. Small to moderate bilateral pleural effusions with dependent bilateral lower lobe collapse/consolidation. 3. Widespread sclerotic bone metastases. 4. Heterogeneous liver parenchyma, nonspecific but may be related to fatty deposition or cirrhosis. Metastatic disease considered less likely but not excluded. Electronically Signed   By: Misty Stanley M.D.   On: 07/13/2020 09:23   VAS [name] LOWER EXTREMITY VENOUS (DVT)  Result Date: 07/14/2020  Lower Venous DVT Study Indications: Swelling.  Limitations: Poor ultrasound/tissue interface. Comparison Study: No prior studies. Performing Technologist: Oliver Hum RVT  Examination Guidelines: A complete evaluation includes B-mode imaging, spectral Doppler, color Doppler, and power Doppler as needed of all accessible portions of each vessel. Bilateral testing is considered an integral part of a complete examination. Limited examinations for reoccurring indications may be performed as noted. The reflux portion of the exam is performed with the patient in reverse Trendelenburg.  +---------+---------------+---------+-----------+----------+--------------+ RIGHT    CompressibilityPhasicitySpontaneityPropertiesThrombus Aging +---------+---------------+---------+-----------+----------+--------------+ CFV      Full           Yes      Yes                                  +---------+---------------+---------+-----------+----------+--------------+ SFJ      Full                                                        +---------+---------------+---------+-----------+----------+--------------+ FV Prox  Full                                                        +---------+---------------+---------+-----------+----------+--------------+  FV Mid   Full                                                        +---------+---------------+---------+-----------+----------+--------------+ FV DistalFull                                                        +---------+---------------+---------+-----------+----------+--------------+ PFV      Full                                                        +---------+---------------+---------+-----------+----------+--------------+ POP      Full           Yes      Yes                                 +---------+---------------+---------+-----------+----------+--------------+ PTV      Full                                                        +---------+---------------+---------+-----------+----------+--------------+ PERO     Full                                                        +---------+---------------+---------+-----------+----------+--------------+   +---------+---------------+---------+-----------+----------+--------------+ LEFT     CompressibilityPhasicitySpontaneityPropertiesThrombus Aging +---------+---------------+---------+-----------+----------+--------------+ CFV      Full           Yes      Yes                                 +---------+---------------+---------+-----------+----------+--------------+ SFJ      Full                                                        +---------+---------------+---------+-----------+----------+--------------+ FV Prox  Full                                                         +---------+---------------+---------+-----------+----------+--------------+ FV Mid   Full                                                        +---------+---------------+---------+-----------+----------+--------------+  FV DistalFull                                                        +---------+---------------+---------+-----------+----------+--------------+ PFV      Full                                                        +---------+---------------+---------+-----------+----------+--------------+ POP      Full           Yes      Yes                                 +---------+---------------+---------+-----------+----------+--------------+ PTV      Full                                                        +---------+---------------+---------+-----------+----------+--------------+ PERO     Full                                                        +---------+---------------+---------+-----------+----------+--------------+     Summary: RIGHT: - There is no evidence of deep vein thrombosis in the lower extremity.  - No cystic structure found in the popliteal fossa.  LEFT: - There is no evidence of deep vein thrombosis in the lower extremity.  - No cystic structure found in the popliteal fossa.  *See table(s) above for measurements and observations. Electronically signed by Ruta Hinds MD on 07/14/2020 at 10:12:25 AM.    Final     Cardiac Studies   TTE 07/12/20: IMPRESSIONS  1. Left ventricular ejection fraction, by estimation, is 60 to 65%. The  left ventricle has normal function. There is mild left ventricular  hypertrophy. Left ventricular diastolic parameters are indeterminate.  2. Right ventricular systolic function is normal. The right ventricular  size is mildly enlarged. There is mildly elevated pulmonary artery  systolic pressure. The estimated right ventricular systolic pressure is  11.9 mmHg. Mildly D-shaped  interventricular septum  suggests elevated RV pressure.  3. Left atrial size was moderately dilated.  4. Right atrial size was moderately dilated.  5. The mitral valve is normal in structure. Trivial mitral valve  regurgitation. No evidence of mitral stenosis.  6. The aortic valve is tricuspid. Aortic valve regurgitation is not  visualized. Mild aortic valve sclerosis is present, with no evidence of  aortic valve stenosis.  7. The inferior vena cava is dilated in size with <50% respiratory  variability, suggesting right atrial pressure of 15 mmHg.  8. Significant left pleural effusion.   07/13/20 LE Doppler: Summary:  RIGHT:  - There is no evidence of deep vein thrombosis in the lower extremity.    - No cystic structure found in the popliteal fossa.    LEFT:  - There  is no evidence of deep vein thrombosis in the lower extremity.    - No cystic structure found in the popliteal fossa.    *See table(s) above for measurements and observations.  CTA chest 06/25/20: IMPRESSION: No central, segmental, or subsegmental pulmonary embolism  Moderate to large bilateral pleural effusions with adjacent compressive atelectasis.  Ground-glass opacities within the right upper lung and right middle lobe, likely due to infectious or inflammatory etiology.  Extensive diffuse osseous metastatic disease.   Patient Profile     64 y.o. male with PMH of metastatic prostate cancer, permanent atrial fibrillation admitted with acute on chronic diastolic congestive heart failure.  Assessment & Plan    #Acute on chronic diastolic heart failure: Remains overloaded but volume status improving. Cr stable at 0.73. Wt improving. -Continue diuresis with lasix 60mg  IV BID; achieving good diuresis -Continue metop and digoxin -Monitor I/Os and daily weights -Low Na diet  #Permanent Afib Remains well rate controlled. -Continue metop and dilt -Continue digoxin -Continue apixaban for [facility]  #Pleural  effusions Previously exudative. Has metastatic prostate cancer but effusions negative for malignancy. Has required thoracentesis in the past. CT chest 11/13 showed small to moderate bilateral effusions. Has ongoing dyspnea but remains overloaded in setting of acute on chronic HF. Will continue with ongoing diuresis and reassess once volume status improved  -Continue to monitor with diuresis; do not think needs thora at this time  #Pulmonary HTN/RV failure Unclear etiology. Previous CTA without evidence of embolus and LE dopplers without evidence of DVT. ? Chronic PE. No history of lung pathology. Currently on apixaban. -Continue diuresis as above -Continue apixaban  -CTA/LE doppler without evidence of clot -May consider V/Q scan in future to r/o chronic PE once euvolemic  Other hospital issues: #Metastatic prostate cancer #Patent foramen ovale #Hypothyroidism      For questions or updates, please contact [facility] Please consult [email or web address] for contact info under        Signed, Freada Bergeron, MD  07/15/2020, 8:27 AM

## 2020-07-14 NOTE — Progress Notes (Signed)
Progress Note  Patient Name: Chris Morales Date of Encounter: 07/14/2020  [facility] Cardiologist: Dr Marisue Ivan  Subjective   No CP or dyspnea  Inpatient Medications    Scheduled Meds: . apixaban  5 mg Oral BID  . bicalutamide  50 mg Oral Daily  . digoxin  0.125 mg Oral Daily  . diltiazem  120 mg Oral Daily  . folic acid  1 mg Oral Daily  . furosemide  60 mg Intravenous BID  . levothyroxine  25 mcg Oral QAC breakfast  . metoprolol succinate  100 mg Oral BID   Continuous Infusions:  PRN Meds: acetaminophen, ondansetron **OR** ondansetron (ZOFRAN) IV, polyethylene glycol   Vital Signs    Vitals:   07/13/20 0917 07/13/20 2101 07/14/20 0300 07/14/20 0638  BP: 95/61 (!) 97/59  107/77  Pulse: 77 79  (!) 109  Resp:  18  18  Temp:  98.2 F (36.8 C)  98 F (36.7 C)  TempSrc:  Oral  Oral  SpO2:  100%  100%  Weight:   69.6 kg   Height:        Intake/Output Summary (Last 24 hours) at 07/14/2020 0811 Last data filed at 07/14/2020 0639 Gross per 24 hour  Intake 480 ml  Output 4640 ml  Net -4160 ml   Last 3 Weights 07/14/2020 07/13/2020 07/13/2020  Weight (lbs) 153 lb 8 oz 158 lb 11.7 oz 158 lb 11.2 oz  Weight (kg) 69.627 kg 72 kg 71.986 kg      Telemetry    Atrial fibrillation rate controlled- Personally Reviewed   Physical Exam   GEN: No acute distress.  WD WN Neck: supple, positive JVD Cardiac: irregular, no gallop Respiratory: CTA GI: Soft, NT/ND MS: 2+ edema Neuro:  Grossly intact Psych: Normal affect   Labs    High Sensitivity Troponin:   Recent Labs  Lab 06/25/20 1546 07/12/20 1730 07/12/20 1900  TROPONINIHS 15 17 19*      Chemistry Recent Labs  Lab 07/08/20 0757 07/12/20 1512 07/13/20 0447  NA 139 139 137  K 4.3 4.3 4.0  CL 108 106 102  CO2 23 21* 29  GLUCOSE 133* 103* 94  BUN 16 19 22   CREATININE 0.75 0.87 0.79  CALCIUM 8.1* 8.1* 8.4*  PROT 5.8* 5.8* 5.9*  ALBUMIN 2.6* 2.9* 2.8*  AST 18 17 16   ALT 21 17 17   ALKPHOS  2,559* 2,412* 2,382*  BILITOT 0.8 0.9 1.1  GFRNONAA >60 >60 >60  ANIONGAP 8 12 6      Hematology Recent Labs  Lab 07/08/20 0757 07/12/20 1512 07/13/20 0447  WBC 5.3 5.6 5.7  RBC 3.37* 3.13* 3.15*  HGB 9.7* 9.1* 9.0*  HCT 31.8* 30.9* 30.3*  MCV 94.4 98.7 96.2  MCH 28.8 29.1 28.6  MCHC 30.5 29.4* 29.7*  RDW 22.5* 23.7* 23.1*  PLT 326 290 293    BNP Recent Labs  Lab 07/12/20 1512  BNP 271.0*     Radiology    DG Chest 1 View  Result Date: 07/12/2020 CLINICAL DATA:  Shortness of breath EXAM: CHEST  1 VIEW COMPARISON:  July 01, 2020.  History of prostate carcinoma FINDINGS: There is airspace consolidation in the left base region. Lungs elsewhere clear. Heart is upper normal in size with pulmonary vascularity normal. No adenopathy. Sclerotic bony metastases noted throughout the thoracic region. IMPRESSION: Airspace opacity left base consistent with pneumonia. Lungs elsewhere clear. Heart upper normal in size. Widespread bony metastases noted throughout the thoracic region. Electronically Signed  By: Lowella Grip III M.D.   On: 07/12/2020 14:24   DG Chest 2 View  Result Date: 07/12/2020 CLINICAL DATA:  Shortness of breath.  Metastatic prostate cancer. EXAM: CHEST - 2 VIEW COMPARISON:  07/12/2020 FINDINGS: Diffuse bony sclerosis compatible with diffuse osseous metastatic disease. Moderate bilateral pleural effusions with associated passive atelectasis. Airway thickening is present, suggesting bronchitis or reactive airways disease. Heart size within normal limits. IMPRESSION: 1. Moderate bilateral pleural effusions with associated passive atelectasis. 2. Airway thickening is present, suggesting bronchitis or reactive airways disease. 3. Diffuse sclerotic osseous metastatic disease. Electronically Signed   By: Van Clines M.D.   On: 07/12/2020 18:24   CT CHEST W CONTRAST  Result Date: 07/13/2020 CLINICAL DATA:  Pleural effusion on chest x-ray. EXAM: CT CHEST WITH  CONTRAST TECHNIQUE: Multidetector CT imaging of the chest was performed during intravenous contrast administration. CONTRAST:  91mL OMNIPAQUE IOHEXOL 300 MG/ML  SOLN COMPARISON:  06/25/2020 FINDINGS: Cardiovascular: The heart size is normal. No substantial pericardial effusion. No thoracic aortic aneurysm no large central pulmonary embolus Mediastinum/Nodes: No mediastinal lymphadenopathy. Calcified nodal tissue noted in the right hilum. There is no hilar lymphadenopathy. The esophagus has normal imaging features. There is no axillary lymphadenopathy. Lungs/Pleura: Patchy areas of central ground-glass attenuation are seen in the upper lobes, right greater than left, decreased in the interval. There is dependent bilateral lower lobe collapse/consolidation with small to moderate bilateral pleural effusions. Upper Abdomen: Heterogeneous liver parenchyma is nonspecific but may be related to fatty deposition or cirrhosis. Calcified granulomata noted in the spleen. Musculoskeletal: Widespread sclerotic bone metastases again noted. IMPRESSION: 1. Patchy areas of central ground-glass attenuation in the upper lobes, right greater than left, decreased in the interval. Imaging features likely reflect improving infectious/inflammatory etiology. 2. Small to moderate bilateral pleural effusions with dependent bilateral lower lobe collapse/consolidation. 3. Widespread sclerotic bone metastases. 4. Heterogeneous liver parenchyma, nonspecific but may be related to fatty deposition or cirrhosis. Metastatic disease considered less likely but not excluded. Electronically Signed   By: Misty Stanley M.D.   On: 07/13/2020 09:23   VAS [name] LOWER EXTREMITY VENOUS (DVT)  Result Date: 07/13/2020  Lower Venous DVT Study Indications: Swelling.  Risk Factors: Cancer. Limitations: Poor ultrasound/tissue interface. Comparison Study: No prior studies. Performing Technologist: Oliver Hum RVT  Examination Guidelines: A complete evaluation  includes B-mode imaging, spectral Doppler, color Doppler, and power Doppler as needed of all accessible portions of each vessel. Bilateral testing is considered an integral part of a complete examination. Limited examinations for reoccurring indications may be performed as noted. The reflux portion of the exam is performed with the patient in reverse Trendelenburg.  +---------+---------------+---------+-----------+----------+--------------+ RIGHT    CompressibilityPhasicitySpontaneityPropertiesThrombus Aging +---------+---------------+---------+-----------+----------+--------------+ CFV      Full           Yes      Yes                                 +---------+---------------+---------+-----------+----------+--------------+ SFJ      Full                                                        +---------+---------------+---------+-----------+----------+--------------+ FV Prox  Full                                                        +---------+---------------+---------+-----------+----------+--------------+  FV Mid   Full                                                        +---------+---------------+---------+-----------+----------+--------------+ FV DistalFull                                                        +---------+---------------+---------+-----------+----------+--------------+ PFV      Full                                                        +---------+---------------+---------+-----------+----------+--------------+ POP      Full           Yes      Yes                                 +---------+---------------+---------+-----------+----------+--------------+ PTV      Full                                                        +---------+---------------+---------+-----------+----------+--------------+ PERO     Full                                                         +---------+---------------+---------+-----------+----------+--------------+   +---------+---------------+---------+-----------+----------+--------------+ LEFT     CompressibilityPhasicitySpontaneityPropertiesThrombus Aging +---------+---------------+---------+-----------+----------+--------------+ CFV      Full           Yes      Yes                                 +---------+---------------+---------+-----------+----------+--------------+ SFJ      Full                                                        +---------+---------------+---------+-----------+----------+--------------+ FV Prox  Full                                                        +---------+---------------+---------+-----------+----------+--------------+ FV Mid   Full                                                        +---------+---------------+---------+-----------+----------+--------------+  FV DistalFull                                                        +---------+---------------+---------+-----------+----------+--------------+ PFV      Full                                                        +---------+---------------+---------+-----------+----------+--------------+ POP      Full           Yes      Yes                                 +---------+---------------+---------+-----------+----------+--------------+ PTV      Full                                                        +---------+---------------+---------+-----------+----------+--------------+ PERO     Full                                                        +---------+---------------+---------+-----------+----------+--------------+     Summary: RIGHT: - There is no evidence of deep vein thrombosis in the lower extremity.  - No cystic structure found in the popliteal fossa.  LEFT: - There is no evidence of deep vein thrombosis in the lower extremity.  - No cystic structure found in the popliteal fossa.   *See table(s) above for measurements and observations.    Preliminary    ECHOCARDIOGRAM LIMITED  Result Date: 07/12/2020    ECHOCARDIOGRAM LIMITED REPORT   Patient Name:   RAYSHAUN NEEDLE Date of Exam: 07/12/2020 Medical Rec #:  [id number]    Height:       73.0 in Accession #:    [id number]   Weight:       159.0 lb Date of Birth:  1955/10/31    BSA:          1.951 m Patient Age:    64 years     BP:           116/65 mmHg Patient Gender: M            HR:           87 bpm. Exam Location:  Inpatient Procedure: Limited Echo, Limited Color Doppler and Cardiac Doppler Indications:     atrial fibrillation  History:         Patient has no prior history of Echocardiogram examinations.  Sonographer:     Johny Chess Referring Phys:  [id number] Leanor Kail Diagnosing Phys: Loralie Champagne MD IMPRESSIONS  1. Left ventricular ejection fraction, by estimation, is 60 to 65%. The left ventricle has normal function. There is mild left ventricular hypertrophy. Left ventricular diastolic parameters are indeterminate.  2. Right ventricular systolic function is normal. The right  ventricular size is mildly enlarged. There is mildly elevated pulmonary artery systolic pressure. The estimated right ventricular systolic pressure is 10.3 mmHg. Mildly D-shaped interventricular septum suggests elevated RV pressure.  3. Left atrial size was moderately dilated.  4. Right atrial size was moderately dilated.  5. The mitral valve is normal in structure. Trivial mitral valve regurgitation. No evidence of mitral stenosis.  6. The aortic valve is tricuspid. Aortic valve regurgitation is not visualized. Mild aortic valve sclerosis is present, with no evidence of aortic valve stenosis.  7. The inferior vena cava is dilated in size with <50% respiratory variability, suggesting right atrial pressure of 15 mmHg.  8. Significant left pleural effusion. FINDINGS  Left Ventricle: Left ventricular ejection fraction, by estimation, is 60 to 65%. The left  ventricle has normal function. The left ventricular internal cavity size was normal in size. There is mild left ventricular hypertrophy. Left ventricular diastolic  parameters are indeterminate. Right Ventricle: The right ventricular size is mildly enlarged. No increase in right ventricular wall thickness. Right ventricular systolic function is normal. There is mildly elevated pulmonary artery systolic pressure. The tricuspid regurgitant velocity is 2.55 m/s, and with an assumed right atrial pressure of 15 mmHg, the estimated right ventricular systolic pressure is 15.9 mmHg. Left Atrium: Left atrial size was moderately dilated. Right Atrium: Right atrial size was moderately dilated. Pericardium: Significant left pleural effusion. Trivial pericardial effusion is present. Mitral Valve: The mitral valve is normal in structure. Trivial mitral valve regurgitation. No evidence of mitral valve stenosis. Tricuspid Valve: The tricuspid valve is normal in structure. Tricuspid valve regurgitation is mild. Aortic Valve: The aortic valve is tricuspid. Aortic valve regurgitation is not visualized. Mild aortic valve sclerosis is present, with no evidence of aortic valve stenosis. Venous: The inferior vena cava is dilated in size with less than 50% respiratory variability, suggesting right atrial pressure of 15 mmHg. IAS/Shunts: No atrial level shunt detected by color flow Doppler. LEFT VENTRICLE PLAX 2D LVIDd:         4.50 cm LVIDs:         3.40 cm LV PW:         1.10 cm LV IVS:        0.70 cm  IVC IVC diam: 2.30 cm LEFT ATRIUM         Index LA diam:    4.30 cm 2.20 cm/m  TRICUSPID VALVE TR Peak grad:   26.0 mmHg TR Vmax:        255.00 cm/s Loralie Champagne MD Electronically signed by Loralie Champagne MD Signature Date/Time: 07/12/2020/4:21:33 PM    Final (Updated)     Patient Profile     64 y.o. male with past medical history of metastatic prostate cancer, persistent atrial fibrillation admitted with acute on chronic diastolic  congestive heart failure.  Echocardiogram October 2021 showed ejection fraction 50 to 55%, D-shaped septum consistent with right ventricular volume overload, mild RV dysfunction, mild right ventricular enlargement, mild biatrial enlargement, large left pleural effusion, mild mitral regurgitation, mild to moderate tricuspid regurgitation and positive microcavitation study consistent with PFO.  Chest x-ray shows moderate bilateral pleural effusions.  Note patient previously found to have exudative pleural effusions.  Assessment & Plan    1 acute on chronic diastolic/right heart failure-Wt 69.6 kg; I/O-[id number].  Volume status improving but still persists.  Continue Lasix at present dose.  Good diuresis yesterday.  Follow renal function closely.  2 permanent atrial fibrillation-Continue Cardizem, digoxin and metoprolol at present dose.  Continue  apixaban.  3 pleural effusions-previously found to be exudative.  He also has a history of metastatic prostate cancer.  We will continue Lasix at present dose.  At present I do not think he will require thoracentesis but will follow.  4 pulmonary hypertension/RV failure-we will continue diuresis.  Previous CTA showed no pulmonary embolus.  Could consider VQ scan to exclude chronic pulmonary emboli; however patient already on apixaban.  5 patent foramen ovale  6 metastatic prostate cancer  For questions or updates, please contact [place] Please consult [email or web address] for contact info under        Signed, Kirk Ruths, MD  07/14/2020, 8:11 AM

## 2020-07-15 ENCOUNTER — Encounter (HOSPITAL_COMMUNITY): Payer: Self-pay | Admitting: Internal Medicine

## 2020-07-15 ENCOUNTER — Other Ambulatory Visit: Payer: Self-pay

## 2020-07-15 DIAGNOSIS — J90 Pleural effusion, not elsewhere classified: Secondary | ICD-10-CM

## 2020-07-15 DIAGNOSIS — E8770 Fluid overload, unspecified: Secondary | ICD-10-CM

## 2020-07-15 LAB — CBC WITH DIFFERENTIAL/PLATELET
Abs Immature Granulocytes: 0.04 10*3/uL (ref 0.00–0.07)
Basophils Absolute: 0.1 10*3/uL (ref 0.0–0.1)
Basophils Relative: 1 %
Eosinophils Absolute: 0.1 10*3/uL (ref 0.0–0.5)
Eosinophils Relative: 3 %
HCT: 28.2 % — ABNORMAL LOW (ref 39.0–52.0)
Hemoglobin: 8.5 g/dL — ABNORMAL LOW (ref 13.0–17.0)
Immature Granulocytes: 1 %
Lymphocytes Relative: 26 %
Lymphs Abs: 1.1 10*3/uL (ref 0.7–4.0)
MCH: 29.1 pg (ref 26.0–34.0)
MCHC: 30.1 g/dL (ref 30.0–36.0)
MCV: 96.6 fL (ref 80.0–100.0)
Monocytes Absolute: 0.6 10*3/uL (ref 0.1–1.0)
Monocytes Relative: 14 %
Neutro Abs: 2.3 10*3/uL (ref 1.7–7.7)
Neutrophils Relative %: 55 %
Platelets: 277 10*3/uL (ref 150–400)
RBC: 2.92 MIL/uL — ABNORMAL LOW (ref 4.22–5.81)
RDW: 22.7 % — ABNORMAL HIGH (ref 11.5–15.5)
WBC: 4.2 10*3/uL (ref 4.0–10.5)
nRBC: 1.7 % — ABNORMAL HIGH (ref 0.0–0.2)

## 2020-07-15 LAB — BASIC METABOLIC PANEL
Anion gap: 10 (ref 5–15)
BUN: 22 mg/dL (ref 8–23)
CO2: 26 mmol/L (ref 22–32)
Calcium: 8.3 mg/dL — ABNORMAL LOW (ref 8.9–10.3)
Chloride: 101 mmol/L (ref 98–111)
Creatinine, Ser: 0.73 mg/dL (ref 0.61–1.24)
GFR, Estimated: >60 mL/min (ref >60)
Glucose, Bld: 92 mg/dL (ref 70–99)
Potassium: 3.8 mmol/L (ref 3.5–5.1)
Sodium: 137 mmol/L (ref 135–145)

## 2020-07-15 NOTE — Progress Notes (Signed)
Occupational Therapy Treatment Patient Details Name: Chris Morales MRN: [id number] DOB: [birth year] Today's Date: 07/15/2020    History of present illness 64 year old male with presented to [facility] with complaints of tachycardia, lower extremity edema and left-sided weakness. Pt adm with afib with rvr, acute hypoxic respiratory failure likely secondary to malignant bil pleural effusions. Pt had recently been hospitalized in [place] with afib with rvr and resp failure requiring vent support and then left AMA before flying back to [facility] and presenting immediately to ED. 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 MRI - thickening and enhancement along the posterior falx. New hyperintensity in R parietal lobe that may reflect associated edema.  PMH including prostate CA with bony mets, C3-4 laminectomy with tumor removal 01/2019, and afib.   OT comments  Upon arrival, pt sitting up in recliner with brother at bedside. Pt very agreeable to participate in HEP at [place]. Pt continues to present with poor AROM at right shoulder; reports it is an old injury that he re-injured 4 months ago. Pt performing functional mobility in hallway with supervision. Continue to recommend dc to home with follow up at OP both for activity tolerance and to address RUE injury. Will continue to follow acutely as admitted.   Pt also with soft BP: sitting 97/60 (72), standing 99/62 (73), sitting after short distance mobility 96/61 (72), and sitting after long distance mobility 96/58 (70). Denies any dizziness.     Follow Up Recommendations  Outpatient OT (OP OT and Orthopedic consult)    Equipment Recommendations  None recommended by OT    Recommendations for Other Services      Precautions / Restrictions Precautions Precautions: Fall Precaution Comments: Edema in BLE, RTC injury R shoulder Restrictions Weight Bearing Restrictions: No       Mobility Bed Mobility               General bed mobility  comments: up in chair, returned to chair  Transfers Overall transfer level: Independent Equipment used: None Transfers: Sit to/from Stand Sit to Stand: Independent              Balance Overall balance assessment: Mild deficits observed, not formally tested Sitting-balance support: Feet supported Sitting balance-Leahy Scale: Good     Standing balance support: During functional activity;No upper extremity supported Standing balance-Leahy Scale: Good Standing balance comment: no UE support, no overt Lob                           ADL either performed or assessed with clinical judgement   ADL Overall ADL's : Needs assistance/impaired                 Upper Body Dressing : Supervision/safety;Set up;Sitting Upper Body Dressing Details (indicate cue type and reason): [place] sweater     Toilet Transfer: Supervision/safety           Functional mobility during ADLs: Supervision/safety General ADL Comments: Reviewed HEP handout. Focused on scapular movement. Very limited AROM at [place]. Supervision for mobility in hallway     Vision       Perception     Praxis      Cognition Arousal/Alertness: Awake/alert Behavior During Therapy: [facility] for tasks assessed/performed Overall Cognitive Status: Within Functional Limits for tasks assessed  Exercises Exercises: General Upper Extremity;General Lower Extremity General Exercises - Upper Extremity Shoulder Flexion: AAROM;Seated;Right;10 reps (sliding on table) Shoulder ABduction: AAROM;Right;10 reps;Seated (Sliding on table) General Exercises - Lower Extremity Long Arc Quad: AROM;Both;10 reps;Supine Other Exercises Other Exercises: Scapular adduction; 10x; seated Other Exercises: Wall slides with assistance ; 10x; standing   Shoulder Instructions       General Comments BP sitting 97/60 (72), standing 99/62 (73), sitting after short distance mobility  96/61 (72), and sitting after long distance mobility 96/58 (70)    Pertinent Vitals/ Pain       Pain Assessment: No/denies pain  Home Living                                          Prior Functioning/Environment              Frequency  Min 2X/week        Progress Toward Goals  OT Goals(current goals can now be found in the care plan section)  Progress towards OT goals: Progressing toward goals  Acute Rehab OT Goals Patient Stated Goal: to go home OT Goal Formulation: With patient Time For Goal Achievement: 07/27/20 Potential to Achieve Goals: Good ADL Goals Pt Will Perform Lower Body Bathing: with min assist;sitting/lateral leans;sit to/from stand Pt Will Perform Lower Body Dressing: with min assist;sitting/lateral leans;sit to/from stand Pt Will Transfer to Toilet: with min assist;ambulating;regular height toilet;grab bars Pt/caregiver will Perform Home Exercise Program: Increased strength;Both right and left upper extremity;With theraband;With written HEP provided Additional ADL Goal #1: Pt will follow multistep ADL commands with min VC's  Plan Discharge plan remains appropriate;Frequency remains appropriate    Co-evaluation                 AM-PAC OT "6 Clicks" Daily Activity     Outcome Measure   Help from another person eating meals?: A Little Help from another person taking care of personal grooming?: A Little Help from another person toileting, which includes using toliet, bedpan, or urinal?: A Little Help from another person bathing (including washing, rinsing, drying)?: A Little Help from another person to put on and taking off regular upper body clothing?: A Little Help from another person to put on and taking off regular lower body clothing?: A Little 6 Click Score: 18    End of Session Equipment Utilized During Treatment: Gait belt  OT Visit Diagnosis: Unsteadiness on feet (R26.81);Other abnormalities of gait and mobility  (R26.89);Muscle weakness (generalized) (M62.81);Other symptoms and signs involving cognitive function Pain - part of body: Hip (at times"; discomfort "it's a little tight")   Activity Tolerance Patient tolerated treatment well   Patient Left in chair;with call bell/phone within reach   Nurse Communication Mobility status        Time: 0347-4259 OT Time Calculation (min): 33 min  Charges: OT General Charges $OT Visit: 1 Visit OT Treatments $Therapeutic Activity: 23-37 mins  [place], OTR/L Acute Rehab Pager: [phone] Office: [place] 07/15/2020, 5:09 PM

## 2020-07-15 NOTE — Progress Notes (Signed)
Physical Therapy Treatment Patient Details Name: Chris Morales MRN: [id number] DOB: Jun 02, 1956 Today's Date: 07/15/2020    History of Present Illness 64 year old male with presented to [facility] with complaints of tachycardia, lower extremity edema and left-sided weakness. Pt adm with afib with rvr, acute hypoxic respiratory failure likely secondary to malignant bil pleural effusions. Pt had recently been hospitalized in [place] with afib with rvr and resp failure requiring vent support and then left AMA before flying back to [facility] and presenting immediately to ED. 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 MRI - thickening and enhancement along the posterior falx. New hyperintensity in R parietal lobe that may reflect associated edema.  PMH including prostate CA with bony mets, C3-4 laminectomy with tumor removal 01/2019, and afib.    PT Comments    Patient received in recliner, agrees to PT session. Patient is independent with sit to stand. Good awareness, stands for a minute prior to walking to be sure he is not feeling dizzy. Patient ambulated 400 feet without ad, supervision. No overt lob, no reported or noted difficulties. RN notified that patient should be okay to walk out in halls independently. Patient will benefit from continues skilled PT for high level balance activities and standing exercises to improve strength and balance.        Follow Up Recommendations  Outpatient PT     Equipment Recommendations  None recommended by PT    Recommendations for Other Services       Precautions / Restrictions Precautions Precautions: Fall Precaution Comments: Edema in BLE, RTC injury R shoulder Restrictions Weight Bearing Restrictions: No    Mobility  Bed Mobility               General bed mobility comments: up in chair, returned to chair  Transfers Overall transfer level: Independent Equipment used: None Transfers: Sit to/from Stand Sit to Stand: Independent             Ambulation/Gait Ambulation/Gait assistance: Supervision Gait Distance (Feet): 400 Feet Assistive device: None Gait Pattern/deviations: Step-through pattern Gait velocity: slightly reduced       Stairs             Wheelchair Mobility    Modified Rankin (Stroke Patients Only)       Balance Overall balance assessment: Mild deficits observed, not formally tested Sitting-balance support: Feet supported Sitting balance-Leahy Scale: Good     Standing balance support: During functional activity;No upper extremity supported Standing balance-Leahy Scale: Good Standing balance comment: no UE support, no overt Lob                            Cognition Arousal/Alertness: Awake/alert Behavior During Therapy: WFL for tasks assessed/performed Overall Cognitive Status: Within Functional Limits for tasks assessed                                        Exercises      General Comments        Pertinent Vitals/Pain Pain Assessment: No/denies pain    Home Living                      Prior Function            PT Goals (current goals can now be found in the care plan section) Acute Rehab PT Goals Patient Stated  Goal: to go home PT Goal Formulation: With patient Time For Goal Achievement: 07/27/20 Potential to Achieve Goals: Good Progress towards PT goals: Progressing toward goals    Frequency    Min 2X/week      PT Plan Current plan remains appropriate    Co-evaluation              AM-PAC PT "6 Clicks" Mobility   Outcome Measure  Help needed turning from your back to your side while in a flat bed without using bedrails?: None Help needed moving from lying on your back to sitting on the side of a flat bed without using bedrails?: None Help needed moving to and from a bed to a chair (including a wheelchair)?: None Help needed standing up from a chair using your arms (e.g., wheelchair or bedside chair)?:  None Help needed to walk in hospital room?: None Help needed climbing 3-5 steps with a railing? : A Little 6 Click Score: 23    End of Session   Activity Tolerance: Patient tolerated treatment well Patient left: in chair;with call bell/phone within reach Nurse Communication: Mobility status PT Visit Diagnosis: Muscle weakness (generalized) (M62.81)     Time: 6438-3818 PT Time Calculation (min) (ACUTE ONLY): 11 min  Charges:  $Gait Training: 8-22 mins                     Edward Guthmiller, PT, GCS 07/15/20,3:19 PM

## 2020-07-15 NOTE — Progress Notes (Signed)
PT Cancellation Note  Patient Details Name: Chris Morales MRN: [id number] DOB: 09-17-1955   Cancelled Treatment:    Reason Eval/Treat Not Completed: Patient declined, no reason specified. States that maybe he will walk this afternoon. Will re-attempt if time allows.    Jaskiran Pata 07/15/2020, 9:41 AM

## 2020-07-15 NOTE — TOC Progression Note (Signed)
Transition of Care [facility]) - Progression Note    Patient Details  Name: Chris Morales MRN: [id number] Date of Birth: 1956/08/22  Transition of [facility] CM/SW Contact  Zenon Mayo, RN Phone Number: 07/15/2020, 11:18 AM  Clinical Narrative:    NCM received call from [facility] with [facility] [phone], she asked for clinicals to be faxed to [phone].  NCM informed [facility] this information.  Minette Headland also said she needs to know the MD names that patient will be following up with so that the appointments can be approved.          Expected Discharge Plan and Services           Expected Discharge Date: 07/12/20                                     Social Determinants of Health (SDOH) Interventions    Readmission Risk Interventions No flowsheet data found.

## 2020-07-15 NOTE — Progress Notes (Signed)
[facility]  PROGRESS NOTE  Chris Morales IOE:[id number] DOB: 1956/04/03 DOA: 07/12/2020 PCP: Patient, No Pcp Per Admit date - 07/12/2020   Admitting Physician Chris Lange, MD  Outpatient Primary MD for the patient is Patient, No Pcp Per  LOS - 3 Brief Narrative  Chris Morales is a 64 year old male with medical history significant for metastatic prostate cancer, persistent A. fib, chronic diastolic CHF with recent hospitalization on 18/29-93/7 for diastolic CHF exacerbation exudative bilateral effusions requiring thoracentesis (10/28, 10/29) and metastatic prostate cancer, with bony metastatic disease and concern for dural based metastasis who has been receiving inpatient rehabilitation embolus, inpatient rehab since 11/5.  Patient was seen by cardiology on 11/12 for evaluation at 8 PM and was noted to be significantly volume overloaded with high concern for acute exacerbation of diastolic heart failure with need for IV diuresis.  Patient was readmitted to [facility] on 11/12 by [facility] service.  Regarding his diastolic exacerbation patient is still significantly volume overloaded with ongoing dyspnea and atrial fibrillation with RVR, further complicated by hypoalbuminemia.  Patient also has moderate pulmonary hypertension with signs of right ventricular failure but unclear etiology.  CTA chest from earlier hospitalization was negative for PE.  Venous duplex negative for DVTs   Subjective  Feels his breathing is better. No CP.   A & P   Acute on chronic CHF with preserved ejection fraction exacerbation.  Repeat TTE on 11/13 showed preserved EF of 60-65%.  Patient remains significantly overloaded with 3+ pitting edema of the lower extremities to mid thigh, ongoing dyspnea at rest, weight gain,elevated BNP that will require continue IV diuresis.  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 No PE on last CTA. No hx of OSA. No prior lung comorbidities.  Venous duplex with no DVTs --Considering V/q scan to rule out chronic PE  Hypotension, likely related to RV failure resulting in decreased cardiac output. SBP stable in 90s-100.  -tolerating IV lasix and toprol (will place hold parameters), closely monitor  Chronic atrial fibrillation, currently rate controlled.  Had intermittent RVR to 115, but now rate controlled 70s to 80s -Continue diltiazem, digoxin, Toprol, Eliquis -Monitor on telemetry  Bilateral pleural effusions, stable.  Previously exudative based on thoracentesis on 10/28, 10/29.  Cytology at that time was negative for malignancy.  CT chest on 11/13. showed small to moderate-sized bilateral pleural effusions with dependent bilateral lower lobe collapse/consolidation.  Has ongoing dyspnea with no current O2 requirements and normal oxygen saturation -monitor dyspnea and see if IV diuresis improves it -if O2 requirements increased or dyspnea does not improve with diuresis would likely warrant repeat thoracentesis, will monitor/statements with  Prostate cancer with widespread bony metastases.  Elevated alk phos.  History of C4/C5/C6 laminectomies for resection previous dorsal epidural metastatic tumor MRI of the brain more concerning for meningioma per neurology status post Lupron injection last hospitalization -Tolerating Casodex per oncology -Recommend repeat scan in 2 months to be reviewed by CNS tumor board per oncology -Follow-up with Chris Morales on 07/17/2020 -Chris Morales on Monday for nephrectomy for Lupron injections  Hypothyroidism, stable -Continue Synthroid 25 mcg  Family Communication  :  none Code Status : FULL  Disposition Plan  :  Patient is from home. Anticipated d/c date: 2 to 3 days. Barriers to d/c or necessity for inpatient status: IV lasix for CHF exacerbation as he is  signficantly volume overloaded and responding to  therapy Consults  :  Cardiology  Procedures  :  none  DVT Prophylaxis  :  Eliquis  MDM: The below labs and imaging reports were reviewed and summarized above.  Medication management as above.  Lab Results  Component Value Date   PLT 277 07/15/2020    Diet :  Diet Order            Diet Heart Room service appropriate? Yes; Fluid consistency: Thin  Diet effective now                  Inpatient Medications Scheduled Meds: . apixaban  5 mg Oral BID  . bicalutamide  50 mg Oral Daily  . digoxin  0.125 mg Oral Daily  . diltiazem  120 mg Oral Daily  . folic acid  1 mg Oral Daily  . furosemide  60 mg Intravenous BID  . levothyroxine  25 mcg Oral QAC breakfast  . metoprolol succinate  100 mg Oral BID   Continuous Infusions: PRN Meds:.acetaminophen, ondansetron **OR** ondansetron (ZOFRAN) IV, polyethylene glycol  Antibiotics  :   Anti-infectives (From admission, onward)   None       Objective   Vitals:   07/15/20 0730 07/15/20 0856 07/15/20 1128 07/15/20 1620  BP: 98/66 99/60 (!) 92/54 (!) 87/52  Pulse: (!) 58 100 78 80  Resp: _0 Temp: 98.1 F (36.7 C)  98.4 F (36.9 C) 97.8 F (36.6 C)  TempSrc: Oral  Oral Oral  SpO2: 100% 100% 100% 100%  Weight:      Height:        SpO2: 100 %  Wt Readings from Last 3 Encounters:  07/15/20 66.9 kg  07/06/20 72.1 kg  07/05/20 70.6 kg     Intake/Output Summary (Last 24 hours) at 07/15/2020 1828 Last data filed at 07/15/2020 1600 Gross per 24 hour  Intake 1440 ml  Output 3925 ml  Net -2485 ml    Physical Exam:     Awake Alert, Oriented X 3, Normal affect, no distress No new F.N deficits,  [place].AT, Irregularly irregular rhythm, normal rate, no appreciable murmurs Normal respiratory effort on room air, decreased breath sounds at bases, crackles present,  2+ pitting edema of bilateral lower extremities to mid thigh, but improving from prior exam +ve B.Sounds, Abd Soft, No tenderness, No rebound,  guarding or rigidity. No Cyanosis, No new Rash or bruise     I have personally reviewed the following:   Data Reviewed:  CBC Recent Labs  Lab 07/12/20 1512 07/13/20 0447 07/14/20 0757 07/15/20 0554  WBC 5.6 5.7 4.3 4.2  HGB 9.1* 9.0* 9.4* 8.5*  HCT 30.9* 30.3* 31.7* 28.2*  PLT 290 293 275 277  MCV 98.7 96.2 96.6 96.6  MCH 29.1 28.6 28.7 29.1  MCHC 29.4* 29.7* 29.7* 30.1  RDW 23.7* 23.1* 23.0* 22.7*  LYMPHSABS  --   --   --  1.1  MONOABS  --   --   --  0.6  EOSABS  --   --   --  0.1  BASOSABS  --   --   --  0.1    Chemistries  Recent Labs  Lab 07/12/20 1512 07/13/20 0447 07/14/20 0757 07/15/20 0554  NA 139 137 136 137  K 4.3 4.0 4.1 3.8  CL 106 102 101 101  CO2 21* _1 GLUCOSE 103* 94 81 92  BUN _2 CREATININE 0.87  0.79 0.69 0.73  CALCIUM 8.1* 8.4* 8.5* 8.3*  AST 17 16  --   --   ALT 17 17  --   --   ALKPHOS 2,412* 2,382*  --   --   BILITOT 0.9 1.1  --   --    ------------------------------------------------------------------------------------------------------------------ No results for input(s): CHOL, HDL, LDLCALC, TRIG, CHOLHDL, LDLDIRECT in the last 72 hours.  Lab Results  Component Value Date   HGBA1C 5.8 (H) 06/25/2020   ------------------------------------------------------------------------------------------------------------------ Recent Labs    07/13/20 0447  TSH 5.022*   ------------------------------------------------------------------------------------------------------------------ No results for input(s): VITAMINB12, FOLATE, FERRITIN, TIBC, IRON, RETICCTPCT in the last 72 hours.  Coagulation profile Recent Labs  Lab 07/13/20 0447  INR 1.8*    No results for input(s): DDIMER in the last 72 hours.  Cardiac Enzymes No results for input(s): CKMB, TROPONINI, MYOGLOBIN in the last 168 hours.  Invalid input(s):  CK ------------------------------------------------------------------------------------------------------------------    Component Value Date/Time   BNP 271.0 (H) 07/12/2020 1512    Micro Results No results found for this or any previous visit (from the past 240 hour(s)).  Radiology Reports DG Chest 1 View  Result Date: 07/12/2020 CLINICAL DATA:  Shortness of breath EXAM: CHEST  1 VIEW COMPARISON:  July 01, 2020.  History of prostate carcinoma FINDINGS: There is airspace consolidation in the left base region. Lungs elsewhere clear. Heart is upper normal in size with pulmonary vascularity normal. No adenopathy. Sclerotic bony metastases noted throughout the thoracic region. IMPRESSION: Airspace opacity left base consistent with pneumonia. Lungs elsewhere clear. Heart upper normal in size. Widespread bony metastases noted throughout the thoracic region. Electronically Signed   By: Lowella Grip III M.D.   On: 07/12/2020 14:24   DG Chest 1 View  Result Date: 06/28/2020 CLINICAL DATA:  Left thoracentesis. EXAM: CHEST  1 VIEW COMPARISON:  06/28/2020. FINDINGS: Mediastinum and hilar structures normal. Stable cardiomegaly. Bilateral interstitial prominence again noted with interim improvement from prior exam. Findings suggests improving CHF. Improved left pleural effusion. Tiny right pleural effusion. No pneumothorax post thoracentesis. IMPRESSION: Findings consistent with improving CHF. Improved left pleural effusion. No pneumothorax post thoracentesis. Tiny right pleural effusion. Electronically Signed   By: Marcello Moores  Register   On: 06/28/2020 10:55   DG Chest 1 View  Result Date: 06/27/2020 CLINICAL DATA:  Post right thoracentesis. EXAM: CHEST  1 VIEW COMPARISON:  06/24/2020 FINDINGS: Interval improvement in right pleural effusion. No pneumothorax. Improved aeration right lung base Interval progression of left pleural effusion and left lower lobe atelectasis Extensive sclerotic bony changes  compatible with metastatic prostate cancer. IMPRESSION: Improved right pleural effusion with no pneumothorax post thoracentesis Progression of left effusion and left lower lobe atelectasis. Bony metastatic disease. Electronically Signed   By: Franchot Gallo M.D.   On: 06/27/2020 12:06   DG Chest 2 View  Result Date: 07/12/2020 CLINICAL DATA:  Shortness of breath.  Metastatic prostate cancer. EXAM: CHEST - 2 VIEW COMPARISON:  07/12/2020 FINDINGS: Diffuse bony sclerosis compatible with diffuse osseous metastatic disease. Moderate bilateral pleural effusions with associated passive atelectasis. Airway thickening is present, suggesting bronchitis or reactive airways disease. Heart size within normal limits. IMPRESSION: 1. Moderate bilateral pleural effusions with associated passive atelectasis. 2. Airway thickening is present, suggesting bronchitis or reactive airways disease. 3. Diffuse sclerotic osseous metastatic disease. Electronically Signed   By: Van Clines M.D.   On: 07/12/2020 18:24   DG Shoulder Right  Result Date: 07/08/2020 CLINICAL DATA:  Shoulder weakness, bone metastasis, torn rotator cuff EXAM: RIGHT  SHOULDER - 2+ VIEW COMPARISON:  Radiograph 02/20/2014 FINDINGS: A diffusely mottled sclerotic appearance of the bones is compatible with known osseous metastatic disease. There is a high-riding appearance of the humeral head which is suggestive of underlying rotator cuff insufficiency particularly in the setting of known rotator cuff tear. Moderate arthrosis of the glenohumeral and acromioclavicular joints. No acute traumatic osseous injury is present at this time. IMPRESSION: 1. Diffusely mottled sclerotic appearance of the bones compatible with known osseous metastatic disease. 2. High-riding appearance of the humeral head is suggestive of underlying rotator cuff insufficiency, particularly in the setting of known rotator cuff tear. 3. No acute fracture or traumatic malalignment.  Electronically Signed   By: Lovena Le M.D.   On: 07/08/2020 19:23   CT CHEST W CONTRAST  Result Date: 07/13/2020 CLINICAL DATA:  Pleural effusion on chest x-ray. EXAM: CT CHEST WITH CONTRAST TECHNIQUE: Multidetector CT imaging of the chest was performed during intravenous contrast administration. CONTRAST:  67m OMNIPAQUE IOHEXOL 300 MG/ML  SOLN COMPARISON:  06/25/2020 FINDINGS: Cardiovascular: The heart size is normal. No substantial pericardial effusion. No thoracic aortic aneurysm no large central pulmonary embolus Mediastinum/Nodes: No mediastinal lymphadenopathy. Calcified nodal tissue noted in the right hilum. There is no hilar lymphadenopathy. The esophagus has normal imaging features. There is no axillary lymphadenopathy. Lungs/Pleura: Patchy areas of central ground-glass attenuation are seen in the upper lobes, right greater than left, decreased in the interval. There is dependent bilateral lower lobe collapse/consolidation with small to moderate bilateral pleural effusions. Upper Abdomen: Heterogeneous liver parenchyma is nonspecific but may be related to fatty deposition or cirrhosis. Calcified granulomata noted in the spleen. Musculoskeletal: Widespread sclerotic bone metastases again noted. IMPRESSION: 1. Patchy areas of central ground-glass attenuation in the upper lobes, right greater than left, decreased in the interval. Imaging features likely reflect improving infectious/inflammatory etiology. 2. Small to moderate bilateral pleural effusions with dependent bilateral lower lobe collapse/consolidation. 3. Widespread sclerotic bone metastases. 4. Heterogeneous liver parenchyma, nonspecific but may be related to fatty deposition or cirrhosis. Metastatic disease considered less likely but not excluded. Electronically Signed   By: EMisty StanleyM.D.   On: 07/13/2020 09:23   CT ANGIO CHEST PE W OR WO CONTRAST  Result Date: 06/25/2020 CLINICAL DATA:  Worsening shortness of breath EXAM: CT  ANGIOGRAPHY CHEST WITH CONTRAST TECHNIQUE: Multidetector CT imaging of the chest was performed using the standard protocol during bolus administration of intravenous contrast. Multiplanar CT image reconstructions and MIPs were obtained to evaluate the vascular anatomy. CONTRAST:  1071mOMNIPAQUE IOHEXOL 350 MG/ML SOLN COMPARISON:  Radiograph same day FINDINGS: Cardiovascular: There is a optimal opacification of the pulmonary arteries. There is no central,segmental, or subsegmental filling defects within the pulmonary arteries. There is mild cardiomegaly present. No pericardial effusion or thickening. No evidence right heart strain. There is normal three-vessel brachiocephalic anatomy without proximal stenosis. The thoracic aorta is normal in appearance. Mediastinum/Nodes: No hilar, mediastinal, or axillary adenopathy. Thyroid gland, trachea, and esophagus demonstrate no significant findings. Lungs/Pleura: There is moderate to large bilateral pleural effusion with adjacent compressive atelectasis seen. Patchy rounded ground-glass opacities are seen predominantly within the right upper lung and right middle lobe. Upper Abdomen: No acute abnormalities present in the visualized portions of the upper abdomen. There is diffuse anasarca present. Musculoskeletal: Extensive osseous sclerotic blastic lesions are seen throughout the visualized portion of the axial and appendicular skeleton. Review of the MIP images confirms the above findings. IMPRESSION: No central, segmental, or subsegmental pulmonary embolism Moderate to large bilateral  pleural effusions with adjacent compressive atelectasis. Ground-glass opacities within the right upper lung and right middle lobe, likely due to infectious or inflammatory etiology. Extensive diffuse osseous metastatic disease. Electronically Signed   By: Prudencio Pair M.D.   On: 06/25/2020 03:11   MR BRAIN W WO CONTRAST  Result Date: 06/25/2020 CLINICAL DATA:  Left-sided weakness,  metastatic prostate cancer EXAM: MRI HEAD WITHOUT AND WITH CONTRAST TECHNIQUE: Multiplanar, multiecho pulse sequences of the brain and surrounding structures were obtained without and with intravenous contrast. CONTRAST:  63m GADAVIST GADOBUTROL 1 MMOL/ML IV SOLN COMPARISON:  2020 FINDINGS: Brain: There is thickening and enhancement along the posterior falx measuring 5 mm in thickness. The adjacent superior sagittal sinus is uninvolved. There is new minimal adjacent T2 FLAIR hyperintensity in the right parietal lobe that may reflect associated edema. There is no acute infarction or intracranial hemorrhage. There is no parenchymal mass or mass effect. There is no hydrocephalus or extra-axial fluid collection. Prominence of the ventricles and sulci reflects minor generalized parenchymal volume loss. Patchy foci of T2 hyperintensity in the supratentorial white matter are nonspecific but probably reflect mild chronic microvascular ischemic changes. These findings have progressed since 2020. Vascular: Major vessel flow voids at the skull base are preserved. Skull and upper cervical spine: Abnormal marrow signal likely reflecting diffuse metastatic disease. Sinuses/Orbits: Chronic right maxillary sinusitis. Orbits are unremarkable. Other: Sella is unremarkable.  Bilateral mastoid effusions. IMPRESSION: No acute infarction. Thickening enhancement along the posterior falx cerebri with possible minimal associated parenchymal edema. Differential considerations include dural-based metastasis (more likely given normal appearance on prior study) and meningioma. Diffuse osseous metastatic disease. Electronically Signed   By: PMacy MisM.D.   On: 06/25/2020 08:24   MR CERVICAL SPINE W WO CONTRAST  Addendum Date: 07/05/2020   ADDENDUM REPORT: 07/05/2020 13:24 ADDENDUM: I was subsequently contacted by Dr. [place] neurology who directed my attention to subtle dural thickening and enhancement at the C5-C6 level. The dura  measures 1-2 mm in thickness at this level. This finding may be postoperative in etiology. Trace dural-based tumor is difficult to definitively exclude. Consider contrast-enhanced MRI follow-up for surveillance. No resultant significant spinal cord mass effect at this level. This addendum was discussed with Dr. BCurly [place] telephone at approximately 1:30 p.m. on 07/05/2020. Electronically Signed   By: KKellie SimmeringDO   On: 07/05/2020 13:24   Result Date: 07/05/2020 CLINICAL DATA:  Myelopathy, acute or progressive; left arm weakness. Additional history provided: History of prostate cancer metastatic to bone, status post cervical laminectomy with tumor removal in June 2020. EXAM: MRI CERVICAL SPINE WITHOUT AND WITH CONTRAST TECHNIQUE: Multiplanar and multiecho pulse sequences of the cervical spine, to include the craniocervical junction and cervicothoracic junction, were obtained without and with intravenous contrast. CONTRAST:  734mGADAVIST GADOBUTROL 1 MMOL/ML IV SOLN COMPARISON:  Radiographs of the cervical spine 02/20/2019. Cervical spine MRI 02/19/2019. Cervical spine MRI 02/20/2019. FINDINGS: Alignment: Reversal of the expected cervical lordosis. No significant spondylolisthesis. Vertebrae: Vertebral body height is maintained. Diffuse metastatic disease throughout the cervical spine. Since the prior MRI of 02/20/2019, there has been interval C4, C5 and C6 laminectomies for resection of epidural metastatic tumor and microdiscectomy. Multilevel degenerative endplate irregularity. Cord: No spinal cord signal abnormality. Posterior Fossa, vertebral arteries, paraspinal tissues: No abnormality identified within included portions of the posterior fossa. Bilateral mastoid effusions. Flow voids preserved within the imaged cervical vertebral arteries. Paraspinal soft tissues within normal limits. Unchanged nonspecific 13 mm round T2 hyperintense subcutaneous lesion within  the posterior right upper back (series 8, image  36). Disc levels: Unless otherwise stated, the level by level findings below have not significantly changed since prior MRIs of 02/20/2019 and 02/19/2019. Severe disc degeneration at C3-C4, C4-C5, C5-C6, C6-C7 and C7-T1. C2-C3: No significant disc herniation or stenosis. C3-C4: Disc bulge. Uncovertebral and facet hypertrophy. The disc bulge effaces the ventral thecal sac, contacting and minimally flattening the ventral spinal cord with overall mild relative spinal canal narrowing. Moderate/severe left neural foraminal narrowing. C4-C5: Interval laminectomies for resection of previous dorsal epidural tumor. Posterior disc osteophyte complex. Uncinate and facet hypertrophy. No significant spinal canal stenosis. Bilateral neural foraminal narrowing (mild right, moderate left). C5-C6: Interval laminectomies for resection of previous dorsal epidural tumor. Posterior disc osteophyte complex. Uncinate and facet hypertrophy. The disc osteophyte complex minimally effaces the ventral thecal sac without significant spinal canal stenosis. Mild/moderate bilateral neural foraminal narrowing. C6-C7: Posterior disc osteophyte complex. Uncinate and facet hypertrophy. Mild spinal canal stenosis. Bilateral neural foraminal narrowing (mild right, moderate left). C7-T1: This level is imaged sagittally. No significant spinal canal stenosis. Mild bilateral neural foraminal narrowing. IMPRESSION: Since the prior MRI of 02/20/2019, there has been interval C4, C5 and C6 laminectomies for resection of previous dorsal epidural metastatic tumor. No evidence of recurrent epidural tumor at this site. No convincing evidence of epidural metastatic disease within the cervical spine. 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 Cervical spondylosis as outlined and having not significantly changed from the cervical spine MRI of 02/20/2020. Severe disc degeneration throughout the majority of the cervical spine. No  more than mild spinal canal stenosis at any level. At C3-C4, a disc bulge contributes to mild relative spinal canal narrowing, contacting and minimally flattening the ventral spinal cord. Multilevel neural foraminal narrowing greatest on the left at C3-C4 (moderate/severe), on the left at C4-C5 (moderate) and on the left at C6-C7 (moderate). 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 EXAM: PORTABLE CHEST 1 VIEW COMPARISON:  06/28/2020 FINDINGS: The heart size and mediastinal contours are within normal limits. No significant recurrence of left pleural fluid since thoracentesis with probable small pleural effusion remaining. No pneumothorax. Stable bibasilar atelectasis. Stable diffuse sclerotic metastatic disease involving the thoracic spine. IMPRESSION: No significant recurrence of left pleural fluid since thoracentesis with probable small pleural effusion remaining. Stable bibasilar atelectasis. Electronically Signed   By: Aletta Edouard M.D.   On: 07/01/2020 08:03   DG CHEST PORT 1 VIEW  Result Date: 06/28/2020 CLINICAL DATA:  Shortness of breath. EXAM: PORTABLE CHEST 1 VIEW COMPARISON:  06/27/2020. FINDINGS: Mediastinum and heart size normal. Progression of diffuse left lung interstitial infiltrates/edema. Diffuse right lung interstitial infiltrates/edema noted on today's exam. Persistent left-sided pleural effusion without interim change. No pneumothorax. Extensive sclerotic bony metastatic disease again noted. IMPRESSION: 1. Progression of diffuse left lung interstitial infiltrates/edema. Diffuse right lung interstitial infiltrates/edema noted on today's exam. Persistent left-sided pleural effusion without interim change. 2. Extensive sclerotic bony metastatic disease again noted. Electronically Signed   By: [place]   On: 06/28/2020 05:52   DG Chest Portable 1 View  Result Date: 06/24/2020 CLINICAL DATA:  Tachycardia. EXAM: PORTABLE CHEST 1 VIEW COMPARISON:  February 19, 2019 FINDINGS: There is cardiomegaly. There are moderate to large bilateral pleural effusions. There is bibasilar atelectasis. There is diffuse sclerosis throughout the visualized osseous  structures most notably the thoracic spine. There is no pneumothorax. IMPRESSION: 1. Moderate to large bilateral pleural effusions with adjacent atelectasis. 2. Cardiomegaly. 3. Progressive sclerosis throughout the visualized osseous structures consistent with metastatic disease. Electronically Signed   By: Constance Holster M.D.   On: 06/24/2020 21:33   US LIVER DOPPLER  Result Date: 06/25/2020 CLINICAL DATA:  64 year old male with a history of liver disease EXAM: DUPLEX ULTRASOUND OF LIVER TECHNIQUE: Color and duplex Doppler ultrasound was performed to evaluate the hepatic in-flow and out-flow vessels. COMPARISON:  None. FINDINGS: Portal Vein Velocities Main:  43 cm/sec Right:  28 cm/sec Left:  18 cm/sec Hepatic Vein Velocities Right:  77 cm/sec Middle:  78 cm/sec Left:  92 cm/sec Hepatic Artery Velocity:  135 cm/sec Splenic Vein Velocity:  24 cm/sec Varices: Absent Ascites: Absent Spleen volume estimated 52 cubic cm Pleural fluid bilaterally IMPRESSION: Unremarkable duplex of the hepatic vasculature. Bilateral pleural effusions Electronically Signed   By: Corrie Mckusick D.O.   On: 06/25/2020 14:47   ECHOCARDIOGRAM COMPLETE BUBBLE STUDY  Result Date: 06/25/2020    ECHOCARDIOGRAM REPORT   Patient Name:   Chris Morales Date of Exam: 06/25/2020 Medical Rec #:  [id number]    Height:       73.0 in Accession #:    [id number]   Weight:       150.0 lb Date of Birth:  Sep 07, 1955    BSA:          1.904 m Patient Age:    39 years     BP:           111/75 mmHg Patient Gender: M            HR:           126 bpm. Exam Location:  Inpatient Procedure: 2D Echo, Cardiac  Doppler, Color Doppler and Saline Contrast Bubble            Study Indications:    Atrial fibrillation with rapid ventricular response ([place])  History:        Patient has no prior history of Echocardiogram examinations.  Sonographer:    Bernadene Person RDCS Referring Phys: [id number] [place]  1. Left ventricular ejection fraction, by estimation, is 50 to 55%. The left ventricle has low normal function. The left ventricle has no regional wall motion abnormalities. Left ventricular diastolic parameters were normal. There is the interventricular septum is flattened in diastole ('D' shaped left ventricle), consistent with right ventricular volume overload.  2. Right ventricular systolic function is mildly reduced. The right ventricular size is mildly enlarged. There is mildly elevated pulmonary artery systolic pressure. The estimated right ventricular systolic pressure is 82.9 mmHg.  3. Left atrial size was mildly dilated.  4. Right atrial size was mildly dilated.  5. Large pleural effusion in the left lateral region.  6. The mitral valve is normal in structure. Mild mitral valve regurgitation.  7. Tricuspid valve regurgitation is mild to moderate.  8. The aortic valve is normal in structure. Aortic valve regurgitation is not visualized. Mild aortic valve sclerosis is present, with no evidence of aortic valve stenosis.  9. The inferior vena cava is dilated in size with <50% respiratory variability, suggesting right atrial pressure of 15 mmHg. 10. Evidence of atrial level shunting detected by color flow Doppler. Agitated saline contrast bubble study was positive with shunting observed within 3-6 cardiac cycles suggestive of interatrial shunt. Comparison(s): No prior Echocardiogram. Findings are suggestive of right heart volume overload  due to left-to-right shunt and there is a small right to left interatrial shunt by saline contrast study. However, an atrial septal defect could not be outlined with 2D  and color Doppler imaging. Consider TEE or CT angiography to evaluate for anormalous pulmonary vein return or occult ASD. Qp:Qs shunt fraction could not be accurately calculated due to poor quality RV outflow imaging. FINDINGS  Left Ventricle: Left ventricular ejection fraction, by estimation, is 50 to 55%. The left ventricle has low normal function. The left ventricle has no regional wall motion abnormalities. The left ventricular internal cavity size was normal in size. There is no left ventricular hypertrophy. The interventricular septum is flattened in diastole ('D' shaped left ventricle), consistent with right ventricular volume overload. Left ventricular diastolic parameters were normal. Normal left ventricular filling pressure. Right Ventricle: The right ventricular size is mildly enlarged. No increase in right ventricular wall thickness. Right ventricular systolic function is mildly reduced. There is mildly elevated pulmonary artery systolic pressure. The tricuspid regurgitant  velocity is 2.60 m/s, and with an assumed right atrial pressure of 15 mmHg, the estimated right ventricular systolic pressure is 63.8 mmHg. Left Atrium: Left atrial size was mildly dilated. Right Atrium: Right atrial size was mildly dilated. Pericardium: There is no evidence of pericardial effusion. Mitral Valve: The mitral valve is normal in structure. Mild mitral valve regurgitation. Tricuspid Valve: The tricuspid valve is grossly normal. Tricuspid valve regurgitation is mild to moderate. Aortic Valve: The aortic valve is normal in structure. Aortic valve regurgitation is not visualized. Mild aortic valve sclerosis is present, with no evidence of aortic valve stenosis. Pulmonic Valve: The pulmonic valve was not well visualized. Pulmonic valve regurgitation is not visualized. Aorta: The aortic root and ascending aorta are structurally normal, with no evidence of dilitation. Venous: The inferior vena cava is dilated in size with less  than 50% respiratory variability, suggesting right atrial pressure of 15 mmHg. IAS/Shunts: There is redundancy of the interatrial septum. Evidence of atrial level shunting detected by color flow Doppler. Agitated saline contrast was given intravenously to evaluate for intracardiac shunting. Agitated saline contrast bubble study was  positive with shunting observed within 3-6 cardiac cycles suggestive of interatrial shunt. Additional Comments: There is a large pleural effusion in the left lateral region.  LEFT VENTRICLE PLAX 2D LVIDd:         4.10 cm     Diastology LVIDs:         3.00 cm     LV e' medial:    13.40 cm/s LV PW:         1.00 cm     LV E/e' medial:  6.5 LV IVS:        0.90 cm     LV e' lateral:   9.00 cm/s LVOT diam:     2.10 cm     LV E/e' lateral: 9.7 LV SV:         68 LV SV Index:   36 LVOT Area:     3.46 cm  LV Volumes (MOD) LV vol d, MOD A2C: 63.8 ml LV vol d, MOD A4C: 58.6 ml LV vol s, MOD A2C: 32.5 ml LV vol s, MOD A4C: 27.6 ml LV SV MOD A2C:     31.3 ml LV SV MOD A4C:     58.6 ml LV SV MOD BP:      32.7 ml RIGHT VENTRICLE RV S prime:     6.85 cm/s TAPSE (M-mode): 1.7 cm LEFT ATRIUM  Index       RIGHT ATRIUM           Index LA diam:        4.00 cm 2.10 cm/m  RA Area:     21.40 cm LA Vol (A2C):   37.5 ml 19.70 ml/m RA Volume:   61.60 ml  32.35 ml/m LA Vol (A4C):   53.1 ml 27.89 ml/m LA Biplane Vol: 44.8 ml 23.53 ml/m  AORTIC VALVE             PULMONIC VALVE LVOT Vmax:   89.40 cm/s  RVOT Peak grad: 2 mmHg LVOT Vmean:  65.500 cm/s LVOT VTI:    0.197 m  AORTA Ao Root diam: 2.70 cm Ao Asc diam:  2.90 cm MITRAL VALVE               TRICUSPID VALVE MV Area (PHT): 3.77 cm    TR Peak grad:   27.0 mmHg MV Decel Time: 201 msec    TR Vmax:        260.00 cm/s MV E velocity: 87.50 cm/s MV A velocity: 90.80 cm/s  SHUNTS MV E/A ratio:  0.96        Systemic VTI:  0.20 m                            Systemic Diam: 2.10 cm                            Pulmonic VTI:  0.087 m Dani Gobble Croitoru MD  Electronically signed by Sanda Klein MD Signature Date/Time: 06/25/2020/12:54:37 PM    Final    VAS [name] LOWER EXTREMITY VENOUS (DVT)  Result Date: 07/14/2020  Lower Venous DVT Study Indications: Swelling.  Limitations: Poor ultrasound/tissue interface. Comparison Study: No prior studies. Performing Technologist: Oliver Hum RVT  Examination Guidelines: A complete evaluation includes B-mode imaging, spectral Doppler, color Doppler, and power Doppler as needed of all accessible portions of each vessel. Bilateral testing is considered an integral part of a complete examination. Limited examinations for reoccurring indications may be performed as noted. The reflux portion of the exam is performed with the patient in reverse Trendelenburg.  +---------+---------------+---------+-----------+----------+--------------+ RIGHT    CompressibilityPhasicitySpontaneityPropertiesThrombus Aging +---------+---------------+---------+-----------+----------+--------------+ CFV      Full           Yes      Yes                                 +---------+---------------+---------+-----------+----------+--------------+ SFJ      Full                                                        +---------+---------------+---------+-----------+----------+--------------+ FV Prox  Full                                                        +---------+---------------+---------+-----------+----------+--------------+ FV Mid   Full                                                        +---------+---------------+---------+-----------+----------+--------------+  FV DistalFull                                                        +---------+---------------+---------+-----------+----------+--------------+ PFV      Full                                                        +---------+---------------+---------+-----------+----------+--------------+ POP      Full           Yes      Yes                                  +---------+---------------+---------+-----------+----------+--------------+ PTV      Full                                                        +---------+---------------+---------+-----------+----------+--------------+ PERO     Full                                                        +---------+---------------+---------+-----------+----------+--------------+   +---------+---------------+---------+-----------+----------+--------------+ LEFT     CompressibilityPhasicitySpontaneityPropertiesThrombus Aging +---------+---------------+---------+-----------+----------+--------------+ CFV      Full           Yes      Yes                                 +---------+---------------+---------+-----------+----------+--------------+ SFJ      Full                                                        +---------+---------------+---------+-----------+----------+--------------+ FV Prox  Full                                                        +---------+---------------+---------+-----------+----------+--------------+ FV Mid   Full                                                        +---------+---------------+---------+-----------+----------+--------------+ FV DistalFull                                                        +---------+---------------+---------+-----------+----------+--------------+  PFV      Full                                                        +---------+---------------+---------+-----------+----------+--------------+ POP      Full           Yes      Yes                                 +---------+---------------+---------+-----------+----------+--------------+ PTV      Full                                                        +---------+---------------+---------+-----------+----------+--------------+ PERO     Full                                                         +---------+---------------+---------+-----------+----------+--------------+     Summary: RIGHT: - There is no evidence of deep vein thrombosis in the lower extremity.  - No cystic structure found in the popliteal fossa.  LEFT: - There is no evidence of deep vein thrombosis in the lower extremity.  - No cystic structure found in the popliteal fossa.  *See table(s) above for measurements and observations. Electronically signed by Ruta Hinds MD on 07/14/2020 at 10:12:25 AM.    Final    ECHOCARDIOGRAM LIMITED  Result Date: 07/12/2020    ECHOCARDIOGRAM LIMITED REPORT   Patient Name:   Chris Morales Date of Exam: 07/12/2020 Medical Rec #:  [id number]    Height:       73.0 in Accession #:    [id number]   Weight:       159.0 lb Date of Birth:  08-May-1956    BSA:          1.951 m Patient Age:    21 years     BP:           116/65 mmHg Patient Gender: M            HR:           87 bpm. Exam Location:  Inpatient Procedure: Limited Echo, Limited Color Doppler and Cardiac Doppler Indications:     atrial fibrillation  History:         Patient has no prior history of Echocardiogram examinations.  Sonographer:     Johny Chess Referring Phys:  [id number] Leanor Kail Diagnosing Phys: Loralie Champagne MD IMPRESSIONS  1. Left ventricular ejection fraction, by estimation, is 60 to 65%. The left ventricle has normal function. There is mild left ventricular hypertrophy. Left ventricular diastolic parameters are indeterminate.  2. Right ventricular systolic function is normal. The right ventricular size is mildly enlarged. There is mildly elevated pulmonary artery systolic pressure. The estimated right ventricular systolic pressure is 00.3 mmHg. Mildly D-shaped interventricular septum suggests elevated RV pressure.  3. Left atrial size was moderately dilated.  4. Right atrial size was moderately dilated.  5. The mitral valve is normal in structure. Trivial mitral valve regurgitation. No evidence of mitral stenosis.  6. The  aortic valve is tricuspid. Aortic valve regurgitation is not visualized. Mild aortic valve sclerosis is present, with no evidence of aortic valve stenosis.  7. The inferior vena cava is dilated in size with <50% respiratory variability, suggesting right atrial pressure of 15 mmHg.  8. Significant left pleural effusion. FINDINGS  Left Ventricle: Left ventricular ejection fraction, by estimation, is 60 to 65%. The left ventricle has normal function. The left ventricular internal cavity size was normal in size. There is mild left ventricular hypertrophy. Left ventricular diastolic  parameters are indeterminate. Right Ventricle: The right ventricular size is mildly enlarged. No increase in right ventricular wall thickness. Right ventricular systolic function is normal. There is mildly elevated pulmonary artery systolic pressure. The tricuspid regurgitant velocity is 2.55 m/s, and with an assumed right atrial pressure of 15 mmHg, the estimated right ventricular systolic pressure is 03.8 mmHg. Left Atrium: Left atrial size was moderately dilated. Right Atrium: Right atrial size was moderately dilated. Pericardium: Significant left pleural effusion. Trivial pericardial effusion is present. Mitral Valve: The mitral valve is normal in structure. Trivial mitral valve regurgitation. No evidence of mitral valve stenosis. Tricuspid Valve: The tricuspid valve is normal in structure. Tricuspid valve regurgitation is mild. Aortic Valve: The aortic valve is tricuspid. Aortic valve regurgitation is not visualized. Mild aortic valve sclerosis is present, with no evidence of aortic valve stenosis. Venous: The inferior vena cava is dilated in size with less than 50% respiratory variability, suggesting right atrial pressure of 15 mmHg. IAS/Shunts: No atrial level shunt detected by color flow Doppler. LEFT VENTRICLE PLAX 2D LVIDd:         4.50 cm LVIDs:         3.40 cm LV PW:         1.10 cm LV IVS:        0.70 cm  IVC IVC diam: 2.30 cm  LEFT ATRIUM         Index LA diam:    4.30 cm 2.20 cm/m  TRICUSPID VALVE TR Peak grad:   26.0 mmHg TR Vmax:        255.00 cm/s Loralie Champagne MD Electronically signed by Loralie Champagne MD Signature Date/Time: 07/12/2020/4:21:33 PM    Final (Updated)    IR THORACENTESIS ASP PLEURAL SPACE W/IMG GUIDE  Result Date: 06/28/2020 INDICATION: Patient with a history of prostate cancer with extensive metastases now has new onset pleural effusions. Interventional radiology asked to perform a therapeutic and diagnostic thoracentesis. EXAM: ULTRASOUND GUIDED THORACENTESIS MEDICATIONS: 1% lidocaine 10 mL COMPLICATIONS: None immediate. PROCEDURE: An ultrasound guided thoracentesis was thoroughly discussed with the patient and questions answered. The benefits, risks, alternatives and complications were also discussed. The patient understands and wishes to proceed with the procedure. Written consent was obtained. Ultrasound was performed to localize and mark an adequate pocket of fluid in the left chest. The area was then prepped and draped in the normal sterile fashion. 1% Lidocaine was used for local anesthesia. Under ultrasound guidance a 6 Fr Safe-T-Centesis catheter was introduced. Thoracentesis was performed. The catheter was removed and a dressing applied. FINDINGS: A total of approximately 1.3 L of bright yellow fluid was removed. Samples were sent to the laboratory as requested by the clinical team. IMPRESSION: Successful ultrasound guided left thoracentesis yielding 1.3 L of pleural fluid. Read by: Soyla Dryer, NP Electronically Signed   By: Corrie Mckusick D.O.  On: 06/28/2020 12:11   IR THORACENTESIS ASP PLEURAL SPACE W/IMG GUIDE  Result Date: 06/27/2020 INDICATION: Patient with a history of prostate cancer with extensive metastases now has new onset pleural effusions. Interventional radiology asked to perform a therapeutic and diagnostic thoracentesis. EXAM: ULTRASOUND GUIDED THORACENTESIS MEDICATIONS: 1%  lidocaine 20 mL COMPLICATIONS: None immediate. PROCEDURE: An ultrasound guided thoracentesis was thoroughly discussed with the patient and questions answered. The benefits, risks, alternatives and complications were also discussed. The patient understands and wishes to proceed with the procedure. Written consent was obtained. Ultrasound was performed to localize and mark an adequate pocket of fluid in the right chest. The area was then prepped and draped in the normal sterile fashion. 1% Lidocaine was used for local anesthesia. Under ultrasound guidance a 6 Fr Safe-T-Centesis catheter was introduced. Thoracentesis was performed. The catheter was removed and a dressing applied. FINDINGS: A total of approximately 1.6 L of dark yellow fluid was removed. Samples were sent to the laboratory as requested by the clinical team. IMPRESSION: Successful ultrasound guided right thoracentesis yielding 1.6 L of pleural fluid. Read by: Soyla Dryer, NP Electronically Signed   By: Markus Daft M.D.   On: 06/27/2020 12:29     Time Spent in minutes  30     Desiree Hane M.D on 07/15/2020 at 6:28 PM  To page go to [email or web address] - password [email or web address]

## 2020-07-16 LAB — BASIC METABOLIC PANEL
Anion gap: 10 (ref 5–15)
BUN: 27 mg/dL — ABNORMAL HIGH (ref 8–23)
CO2: 27 mmol/L (ref 22–32)
Calcium: 8.4 mg/dL — ABNORMAL LOW (ref 8.9–10.3)
Chloride: 103 mmol/L (ref 98–111)
Creatinine, Ser: 0.76 mg/dL (ref 0.61–1.24)
GFR, Estimated: >60 mL/min (ref >60)
Glucose, Bld: 97 mg/dL (ref 70–99)
Potassium: 4.4 mmol/L (ref 3.5–5.1)
Sodium: 140 mmol/L (ref 135–145)

## 2020-07-16 LAB — CBC WITH DIFFERENTIAL/PLATELET
Abs Immature Granulocytes: 0.04 10*3/uL (ref 0.00–0.07)
Basophils Absolute: 0.1 10*3/uL (ref 0.0–0.1)
Basophils Relative: 1 %
Eosinophils Absolute: 0.1 10*3/uL (ref 0.0–0.5)
Eosinophils Relative: 3 %
HCT: 29.8 % — ABNORMAL LOW (ref 39.0–52.0)
Hemoglobin: 8.9 g/dL — ABNORMAL LOW (ref 13.0–17.0)
Immature Granulocytes: 1 %
Lymphocytes Relative: 27 %
Lymphs Abs: 1.1 10*3/uL (ref 0.7–4.0)
MCH: 29.1 pg (ref 26.0–34.0)
MCHC: 29.9 g/dL — ABNORMAL LOW (ref 30.0–36.0)
MCV: 97.4 fL (ref 80.0–100.0)
Monocytes Absolute: 0.6 10*3/uL (ref 0.1–1.0)
Monocytes Relative: 14 %
Neutro Abs: 2.2 10*3/uL (ref 1.7–7.7)
Neutrophils Relative %: 54 %
Platelets: 266 10*3/uL (ref 150–400)
RBC: 3.06 MIL/uL — ABNORMAL LOW (ref 4.22–5.81)
RDW: 22.8 % — ABNORMAL HIGH (ref 11.5–15.5)
WBC: 4.1 10*3/uL (ref 4.0–10.5)
nRBC: 1.7 % — ABNORMAL HIGH (ref 0.0–0.2)

## 2020-07-16 NOTE — Progress Notes (Signed)
Progress Note  Patient Name: Chris Morales Date of Encounter: 07/16/2020  [place] Cardiologist: No primary care provider on file.   Subjective  Breathing better. Able to walk the hallway without issues. LE edema improved.  Remains in rate controlled Aflutter Cr stable at 0.76 Net negative 1.8L with 2.8L UOP  Inpatient Medications    Scheduled Meds: . apixaban  5 mg Oral BID  . bicalutamide  50 mg Oral Daily  . digoxin  0.125 mg Oral Daily  . diltiazem  120 mg Oral Daily  . folic acid  1 mg Oral Daily  . furosemide  60 mg Intravenous BID  . levothyroxine  25 mcg Oral QAC breakfast  . metoprolol succinate  100 mg Oral BID   Continuous Infusions:  PRN Meds: acetaminophen, ondansetron **OR** ondansetron (ZOFRAN) IV, polyethylene glycol   Vital Signs    Vitals:   07/15/20 2041 07/16/20 0217 07/16/20 0415 07/16/20 0943  BP: 96/61  (!) 86/54 100/66  Pulse: 71  (!) 58 78  Resp: 18  19 18   Temp: 97.8 F (36.6 C)  98.2 F (36.8 C)   TempSrc: Oral  Oral   SpO2: 100%  100% 100%  Weight:  66.4 kg    Height:        Intake/Output Summary (Last 24 hours) at 07/16/2020 1004 Last data filed at 07/16/2020 0952 Gross per 24 hour  Intake 960 ml  Output 2050 ml  Net -1090 ml   Last 3 Weights 07/16/2020 07/15/2020 07/14/2020  Weight (lbs) 146 lb 6.4 oz 147 lb 8 oz 153 lb 8 oz  Weight (kg) 66.407 kg 66.906 kg 69.627 kg      Telemetry    Aflutter; rate controlled - Personally Reviewed  ECG    No new ECG - Personally Reviewed  Physical Exam   GEN: No acute distress.   Neck: No JVD Cardiac: RRR, no murmurs, rubs, or gallops.  Respiratory: Diminished at bases but clear GI: Soft, nontender, non-distended  MS: Trace edema (improved from prior); warm Neuro:  Nonfocal  Psych: Normal affect   Labs    High Sensitivity Troponin:   Recent Labs  Lab 06/25/20 1546 07/12/20 1730 07/12/20 1900  TROPONINIHS 15 17 19*      Chemistry Recent Labs  Lab  07/12/20 1512 07/12/20 1512 07/13/20 0447 07/13/20 0447 07/14/20 0757 07/15/20 0554 07/16/20 0404  NA 139   < > 137   < > 136 137 140  K 4.3   < > 4.0   < > 4.1 3.8 4.4  CL 106   < > 102   < > 101 101 103  CO2 21*   < > 29   < > 27 26 27   GLUCOSE 103*   < > 94   < > 81 92 97  BUN 19   < > 22   < > 18 22 27*  CREATININE 0.87   < > 0.79   < > 0.69 0.73 0.76  CALCIUM 8.1*   < > 8.4*   < > 8.5* 8.3* 8.4*  PROT 5.8*  --  5.9*  --   --   --   --   ALBUMIN 2.9*  --  2.8*  --   --   --   --   AST 17  --  16  --   --   --   --   ALT 17  --  17  --   --   --   --  ALKPHOS 2,412*  --  2,382*  --   --   --   --   BILITOT 0.9  --  1.1  --   --   --   --   GFRNONAA >60   < > >60   < > >60 >60 >60  ANIONGAP 12   < > 6   < > 8 10 10    < > = values in this interval not displayed.     Hematology Recent Labs  Lab 07/14/20 0757 07/15/20 0554 07/16/20 0404  WBC 4.3 4.2 4.1  RBC 3.28* 2.92* 3.06*  HGB 9.4* 8.5* 8.9*  HCT 31.7* 28.2* 29.8*  MCV 96.6 96.6 97.4  MCH 28.7 29.1 29.1  MCHC 29.7* 30.1 29.9*  RDW 23.0* 22.7* 22.8*  PLT 275 277 266    BNP Recent Labs  Lab 07/12/20 1512  BNP 271.0*     DDimer No results for input(s): DDIMER in the last 168 hours.   Radiology    No results found.  Cardiac Studies   TTE 07/12/20: IMPRESSIONS  1. Left ventricular ejection fraction, by estimation, is 60 to 65%. The  left ventricle has normal function. There is mild left ventricular  hypertrophy. Left ventricular diastolic parameters are indeterminate.  2. Right ventricular systolic function is normal. The right ventricular  size is mildly enlarged. There is mildly elevated pulmonary artery  systolic pressure. The estimated right ventricular systolic pressure is  15.1 mmHg. Mildly D-shaped  interventricular septum suggests elevated RV pressure.  3. Left atrial size was moderately dilated.  4. Right atrial size was moderately dilated.  5. The mitral valve is normal in  structure. Trivial mitral valve  regurgitation. No evidence of mitral stenosis.  6. The aortic valve is tricuspid. Aortic valve regurgitation is not  visualized. Mild aortic valve sclerosis is present, with no evidence of  aortic valve stenosis.  7. The inferior vena cava is dilated in size with <50% respiratory  variability, suggesting right atrial pressure of 15 mmHg.  8. Significant left pleural effusion.   07/13/20 LE Doppler: Summary:  RIGHT:  - There is no evidence of deep vein thrombosis in the lower extremity.    - No cystic structure found in the popliteal fossa.    LEFT:  - There is no evidence of deep vein thrombosis in the lower extremity.    - No cystic structure found in the popliteal fossa.    *See table(s) above for measurements and observations.  CTA chest 06/25/20: IMPRESSION: No central, segmental, or subsegmental pulmonary embolism  Moderate to large bilateral pleural effusions with adjacent compressive atelectasis.  Ground-glass opacities within the right upper lung and right middle lobe, likely due to infectious or inflammatory etiology.  Extensive diffuse osseous metastatic disease.   Patient Profile     64 y.o. male with PMH of metastatic prostate cancer, permanent atrial fibrillation admitted with acute on chronic diastolic congestive heart failure.  Assessment & Plan    #Acute on chronic diastolic heart failure: Volume status improving. Wt down 166-->146. -Continue diuresis with lasix 60mg  IV BID -Continue metop and digoxin -Monitor I/Os and daily weights -Low Na diet  #Permanent Afib Remains well rate controlled. -Continue metop and dilt -Continue digoxin -Continue apixaban for [facility]  #Pleural effusions Previously exudative. Has metastatic prostate cancer but effusions negative for malignancy. Has required thoracentesis in the past. CT chest 11/13 showed small to moderate bilateral effusions. Has ongoing dyspnea but remains  overloaded in setting of acute on chronic HF.  Will continue with ongoing diuresis and reassess once volume status improved  -Continue to monitor with diuresis; do not think needs thora at this time  #Pulmonary HTN/RV failure Unclear etiology. Previous CTA without evidence of embolus and LE dopplers without evidence of DVT. ? Chronic PE. No history of lung pathology. Currently on apixaban. -Continue diuresis as above -Continue apixaban  -CTA/LE doppler without evidence of clot -May consider V/Q scan in future to r/o chronic PE once euvolemic  Other hospital issues: #Metastatic prostate cancer #Patent foramen ovale #Hypothyroidism     For questions or updates, please contact [facility] Please consult [email or web address] for contact info under        Signed, Freada Bergeron, MD  07/16/2020, 10:04 AM

## 2020-07-16 NOTE — Progress Notes (Signed)
[facility]  PROGRESS NOTE  Chris Morales UXL:[id number] DOB: 11/10/1955 DOA: 07/12/2020 PCP: Patient, No Pcp Per Admit date - 07/12/2020   Admitting Physician Charlann Lange, MD  Outpatient Primary MD for the patient is Patient, No Pcp Per  LOS - 4 Brief Narrative  Chris Morales is a 64 year old male with medical history significant for metastatic prostate cancer, persistent A. fib, chronic diastolic CHF with recent hospitalization on 53/66-44/0 for diastolic CHF exacerbation exudative bilateral effusions requiring thoracentesis (10/28, 10/29) and metastatic prostate cancer, with bony metastatic disease and concern for dural based metastasis who has been receiving inpatient rehabilitation embolus, inpatient rehab since 11/5.  Patient was seen by cardiology on 11/12 for evaluation at 8 PM and was noted to be significantly volume overloaded with high concern for acute exacerbation of diastolic heart failure with need for IV diuresis.  Patient was readmitted to [facility] on 11[facility]12 by [facility].  Regarding his diastolic exacerbation patient is still significantly volume overloaded with ongoing dyspnea and atrial fibrillation with RVR, further complicated by hypoalbuminemia.  Patient also has moderate pulmonary hypertension with signs of right ventricular failure but unclear etiology.  CTA chest from earlier hospitalization was negative for PE.  Venous duplex negative for DVTs   Subjective  Continues to report improvement in her breathing.  However patient is unable to lay flat in bed and goes to sleep in recliner due to shortness of breath.  Denies any chest pain  A & P   Acute on chronic CHF with preserved ejection fraction exacerbation.  Repeat TTE on 11/13 showed preserved EF of 60-65%.  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 No PE on last CTA. No hx of OSA. No prior lung comorbidities.  Venous duplex with no DVTs --Considering V/q scan to rule out chronic PE once euvolemic  Hypotension, likely related to RV failure resulting in decreased cardiac output. SBP stable in 90s-100.  -tolerating IV lasix and toprol (will place hold parameters), closely monitor  Chronic atrial fibrillation, currently rate controlled.  Had intermittent RVR to 115, but now rate controlled 70s to 80s -Continue diltiazem, digoxin, Toprol, Eliquis -Monitor on telemetry  Bilateral pleural effusions, stable.  Previously exudative based on thoracentesis on 10/28, 10/29.  Cytology at that time was negative for malignancy.  CT chest on 11/13. showed small to moderate-sized bilateral pleural effusions with dependent bilateral lower lobe collapse/consolidation.  Has ongoing dyspnea with no current O2 requirements and normal oxygen saturation -monitor dyspnea and see if IV diuresis improves it -if O2 requirements increased or dyspnea does not improve with diuresis would likely warrant repeat thoracentesis, will monitor  Prostate cancer with widespread bony metastases.  Elevated alk phos.  History of C4/C5/C6 laminectomies for resection previous dorsal epidural metastatic tumor MRI of the brain more concerning for meningioma per neurology status post Lupron injection last hospitalization -Tolerating Casodex per oncology -Recommend repeat scan in 2 months to be reviewed by CNS tumor board per oncology -Follow-up with Dr. [name] on 07/17/2020, will likely need to reschedule  Hypothyroidism, stable -Continue Synthroid 25 mcg  Family Communication  :  none Code Status : FULL  Disposition Plan  :  Patient is from home. Anticipated d/c date: 2 to 3 days. Barriers to  d/c or necessity for inpatient status: IV lasix for CHF exacerbation as he is signficantly volume overloaded still with persistent orthopnea and responding to therapy Consults  :  Cardiology  Procedures  :  none  DVT Prophylaxis  :  Eliquis  MDM: The below labs and imaging reports were reviewed and summarized above.  Medication management as above.  Lab Results  Component Value Date   PLT 266 07/16/2020    Diet :  Diet Order            Diet Heart Room service appropriate? Yes; Fluid consistency: Thin  Diet effective now                  Inpatient Medications Scheduled Meds: . apixaban  5 mg Oral BID  . bicalutamide  50 mg Oral Daily  . digoxin  0.125 mg Oral Daily  . diltiazem  120 mg Oral Daily  . folic acid  1 mg Oral Daily  . furosemide  60 mg Intravenous BID  . levothyroxine  25 mcg Oral QAC breakfast  . metoprolol succinate  100 mg Oral BID   Continuous Infusions: PRN Meds:.acetaminophen, ondansetron **OR** ondansetron (ZOFRAN) IV, polyethylene glycol  Antibiotics  :   Anti-infectives (From admission, onward)   None       Objective   Vitals:   07/16/20 0217 07/16/20 0415 07/16/20 0943 07/16/20 1053  BP:  (!) 86/54 100/66 92/62  Pulse:  (!) 58 78 80  Resp:  $Remo'19 18 16  'HQyJF$ Temp:  98.2 F (36.8 C)  98.4 F (36.9 C)  TempSrc:  Oral  Oral  SpO2:  100% 100% 100%  Weight: 66.4 kg     Height:        SpO2: 100 %  Wt Readings from Last 3 Encounters:  07/16/20 66.4 kg  07/06/20 72.1 kg  07/05/20 70.6 kg     Intake/Output Summary (Last 24 hours) at 07/16/2020 1413 Last data filed at 07/16/2020 1212 Gross per 24 hour  Intake 720 ml  Output 2400 ml  Net -1680 ml    Physical Exam:     Awake Alert, Oriented X 3, Normal affect, no distress No new F.N deficits,  Ridge.AT, Irregularly irregular rhythm, normal rate, no appreciable murmurs Normal respiratory effort on room air, decreased breath sounds at bases, crackles present,  1+ pitting edema of  bilateral lower extremities to mid calf, continues to improve from prior exam +ve B.Sounds, Abd Soft, No tenderness, No rebound, guarding or rigidity. No Cyanosis, No new Rash or bruise     I have personally reviewed the following:   Data Reviewed:  CBC Recent Labs  Lab 07/12/20 1512 07/13/20 0447 07/14/20 0757 07/15/20 0554 07/16/20 0404  WBC 5.6 5.7 4.3 4.2 4.1  HGB 9.1* 9.0* 9.4* 8.5* 8.9*  HCT 30.9* 30.3* 31.7* 28.2* 29.8*  PLT 290 293 275 277 266  MCV 98.7 96.2 96.6 96.6 97.4  MCH 29.1 28.6 28.7 29.1 29.1  MCHC 29.4* 29.7* 29.7* 30.1 29.9*  RDW 23.7* 23.1* 23.0* 22.7* 22.8*  LYMPHSABS  --   --   --  1.1 1.1  MONOABS  --   --   --  0.6 0.6  EOSABS  --   --   --  0.1 0.1  BASOSABS  --   --   --  0.1 0.1    Chemistries  Recent Labs  Lab 07/12/20 1512 07/13/20 0447 07/14/20 0757 07/15/20 0554 07/16/20 0404  NA 139 137 136 137 140  K 4.3 4.0 4.1 3.8 4.4  CL 106 102 101 101 103  CO2 21* $Remov'29 27 26 27  'HZfWZc$ GLUCOSE 103* 94 81 92 97  BUN $Re'19 22 18 22 'nzn$ 27*  CREATININE 0.87 0.79 0.69 0.73 0.76  CALCIUM 8.1* 8.4* 8.5* 8.3* 8.4*  AST 17 16  --   --   --   ALT 17 17  --   --   --   ALKPHOS 2,412* 2,382*  --   --   --   BILITOT 0.9 1.1  --   --   --    ------------------------------------------------------------------------------------------------------------------ No results for input(s): CHOL, HDL, LDLCALC, TRIG, CHOLHDL, LDLDIRECT in the last 72 hours.  Lab Results  Component Value Date   HGBA1C 5.8 (H) 06/25/2020   ------------------------------------------------------------------------------------------------------------------ No results for input(s): TSH, T4TOTAL, T3FREE, THYROIDAB in the last 72 hours.  Invalid input(s): FREET3 ------------------------------------------------------------------------------------------------------------------ No results for input(s): VITAMINB12, FOLATE, FERRITIN, TIBC, IRON, RETICCTPCT in the last 72 hours.  Coagulation  profile Recent Labs  Lab 07/13/20 0447  INR 1.8*    No results for input(s): DDIMER in the last 72 hours.  Cardiac Enzymes No results for input(s): CKMB, TROPONINI, MYOGLOBIN in the last 168 hours.  Invalid input(s): CK ------------------------------------------------------------------------------------------------------------------    Component Value Date/Time   BNP 271.0 (H) 07/12/2020 1512    Micro Results No results found for this or any previous visit (from the past 240 hour(s)).  Radiology Reports DG Chest 1 View  Result Date: 07/12/2020 CLINICAL DATA:  Shortness of breath EXAM: CHEST  1 VIEW COMPARISON:  July 01, 2020.  History of prostate carcinoma FINDINGS: There is airspace consolidation in the left base region. Lungs elsewhere clear. Heart is upper normal in size with pulmonary vascularity normal. No adenopathy. Sclerotic bony metastases noted throughout the thoracic region. IMPRESSION: Airspace opacity left base consistent with pneumonia. Lungs elsewhere clear. Heart upper normal in size. Widespread bony metastases noted throughout the thoracic region. Electronically Signed   By: Lowella Grip III M.D.   On: 07/12/2020 14:24   DG Chest 1 View  Result Date: 06/28/2020 CLINICAL DATA:  Left thoracentesis. EXAM: CHEST  1 VIEW COMPARISON:  06/28/2020. FINDINGS: Mediastinum and hilar structures normal. Stable cardiomegaly. Bilateral interstitial prominence again noted with interim improvement from prior exam. Findings suggests improving CHF. Improved left pleural effusion. Tiny right pleural effusion. No pneumothorax post thoracentesis. IMPRESSION: Findings consistent with improving CHF. Improved left pleural effusion. No pneumothorax post thoracentesis. Tiny right pleural effusion. Electronically Signed   By: Marcello Moores  Register   On: 06/28/2020 10:55   DG Chest 1 View  Result Date: 06/27/2020 CLINICAL DATA:  Post right thoracentesis. EXAM: CHEST  1 VIEW COMPARISON:   06/24/2020 FINDINGS: Interval improvement in right pleural effusion. No pneumothorax. Improved aeration right lung base Interval progression of left pleural effusion and left lower lobe atelectasis Extensive sclerotic bony changes compatible with metastatic prostate cancer. IMPRESSION: Improved right pleural effusion with no pneumothorax post thoracentesis Progression of left effusion and left lower lobe atelectasis. Bony metastatic disease. Electronically Signed   By: Franchot Gallo M.D.   On: 06/27/2020 12:06   DG Chest 2 View  Result Date: 07/12/2020 CLINICAL DATA:  Shortness of breath.  Metastatic prostate cancer. EXAM: CHEST - 2 VIEW COMPARISON:  07/12/2020 FINDINGS: Diffuse bony sclerosis compatible with diffuse osseous metastatic disease. Moderate bilateral pleural effusions with associated passive atelectasis. Airway thickening  is present, suggesting bronchitis or reactive airways disease. Heart size within normal limits. IMPRESSION: 1. Moderate bilateral pleural effusions with associated passive atelectasis. 2. Airway thickening is present, suggesting bronchitis or reactive airways disease. 3. Diffuse sclerotic osseous metastatic disease. Electronically Signed   By: Van Clines M.D.   On: 07/12/2020 18:24   DG Shoulder Right  Result Date: 07/08/2020 CLINICAL DATA:  Shoulder weakness, bone metastasis, torn rotator cuff EXAM: RIGHT SHOULDER - 2+ VIEW COMPARISON:  Radiograph 02/20/2014 FINDINGS: A diffusely mottled sclerotic appearance of the bones is compatible with known osseous metastatic disease. There is a high-riding appearance of the humeral head which is suggestive of underlying rotator cuff insufficiency particularly in the setting of known rotator cuff tear. Moderate arthrosis of the glenohumeral and acromioclavicular joints. No acute traumatic osseous injury is present at this time. IMPRESSION: 1. Diffusely mottled sclerotic appearance of the bones compatible with known osseous  metastatic disease. 2. High-riding appearance of the humeral head is suggestive of underlying rotator cuff insufficiency, particularly in the setting of known rotator cuff tear. 3. No acute fracture or traumatic malalignment. Electronically Signed   By: Lovena Le M.D.   On: 07/08/2020 19:23   CT CHEST W CONTRAST  Result Date: 07/13/2020 CLINICAL DATA:  Pleural effusion on chest x-ray. EXAM: CT CHEST WITH CONTRAST TECHNIQUE: Multidetector CT imaging of the chest was performed during intravenous contrast administration. CONTRAST:  64mL OMNIPAQUE IOHEXOL 300 MG/ML  SOLN COMPARISON:  06/25/2020 FINDINGS: Cardiovascular: The heart size is normal. No substantial pericardial effusion. No thoracic aortic aneurysm no large central pulmonary embolus Mediastinum/Nodes: No mediastinal lymphadenopathy. Calcified nodal tissue noted in the right hilum. There is no hilar lymphadenopathy. The esophagus has normal imaging features. There is no axillary lymphadenopathy. Lungs/Pleura: Patchy areas of central ground-glass attenuation are seen in the upper lobes, right greater than left, decreased in the interval. There is dependent bilateral lower lobe collapse/consolidation with small to moderate bilateral pleural effusions. Upper Abdomen: Heterogeneous liver parenchyma is nonspecific but may be related to fatty deposition or cirrhosis. Calcified granulomata noted in the spleen. Musculoskeletal: Widespread sclerotic bone metastases again noted. IMPRESSION: 1. Patchy areas of central ground-glass attenuation in the upper lobes, right greater than left, decreased in the interval. Imaging features likely reflect improving infectious/inflammatory etiology. 2. Small to moderate bilateral pleural effusions with dependent bilateral lower lobe collapse/consolidation. 3. Widespread sclerotic bone metastases. 4. Heterogeneous liver parenchyma, nonspecific but may be related to fatty deposition or cirrhosis. Metastatic disease considered  less likely but not excluded. Electronically Signed   By: Misty Stanley M.D.   On: 07/13/2020 09:23   CT ANGIO CHEST PE W OR WO CONTRAST  Result Date: 06/25/2020 CLINICAL DATA:  Worsening shortness of breath EXAM: CT ANGIOGRAPHY CHEST WITH CONTRAST TECHNIQUE: Multidetector CT imaging of the chest was performed using the standard protocol during bolus administration of intravenous contrast. Multiplanar CT image reconstructions and MIPs were obtained to evaluate the vascular anatomy. CONTRAST:  18mL OMNIPAQUE IOHEXOL 350 MG/ML SOLN COMPARISON:  Radiograph same day FINDINGS: Cardiovascular: There is a optimal opacification of the pulmonary arteries. There is no central,segmental, or subsegmental filling defects within the pulmonary arteries. There is mild cardiomegaly present. No pericardial effusion or thickening. No evidence right heart strain. There is normal three-vessel brachiocephalic anatomy without proximal stenosis. The thoracic aorta is normal in appearance. Mediastinum/Nodes: No hilar, mediastinal, or axillary adenopathy. Thyroid gland, trachea, and esophagus demonstrate no significant findings. Lungs/Pleura: There is moderate to large bilateral pleural effusion with adjacent compressive atelectasis  seen. Patchy rounded ground-glass opacities are seen predominantly within the right upper lung and right middle lobe. Upper Abdomen: No acute abnormalities present in the visualized portions of the upper abdomen. There is diffuse anasarca present. Musculoskeletal: Extensive osseous sclerotic blastic lesions are seen throughout the visualized portion of the axial and appendicular skeleton. Review of the MIP images confirms the above findings. IMPRESSION: No central, segmental, or subsegmental pulmonary embolism Moderate to large bilateral pleural effusions with adjacent compressive atelectasis. Ground-glass opacities within the right upper lung and right middle lobe, likely due to infectious or inflammatory  etiology. Extensive diffuse osseous metastatic disease. Electronically Signed   By: Jonna Clark M.D.   On: 06/25/2020 03:11   MR BRAIN W WO CONTRAST  Result Date: 06/25/2020 CLINICAL DATA:  Left-sided weakness, metastatic prostate cancer EXAM: MRI HEAD WITHOUT AND WITH CONTRAST TECHNIQUE: Multiplanar, multiecho pulse sequences of the brain and surrounding structures were obtained without and with intravenous contrast. CONTRAST:  42mL GADAVIST GADOBUTROL 1 MMOL/ML IV SOLN COMPARISON:  2020 FINDINGS: Brain: There is thickening and enhancement along the posterior falx measuring 5 mm in thickness. The adjacent superior sagittal sinus is uninvolved. There is new minimal adjacent T2 FLAIR hyperintensity in the right parietal lobe that may reflect associated edema. There is no acute infarction or intracranial hemorrhage. There is no parenchymal mass or mass effect. There is no hydrocephalus or extra-axial fluid collection. Prominence of the ventricles and sulci reflects minor generalized parenchymal volume loss. Patchy foci of T2 hyperintensity in the supratentorial white matter are nonspecific but probably reflect mild chronic microvascular ischemic changes. These findings have progressed since 2020. Vascular: Major vessel flow voids at the skull base are preserved. Skull and upper cervical spine: Abnormal marrow signal likely reflecting diffuse metastatic disease. Sinuses/Orbits: Chronic right maxillary sinusitis. Orbits are unremarkable. Other: Sella is unremarkable.  Bilateral mastoid effusions. IMPRESSION: No acute infarction. Thickening enhancement along the posterior falx cerebri with possible minimal associated parenchymal edema. Differential considerations include dural-based metastasis (more likely given normal appearance on prior study) and meningioma. Diffuse osseous metastatic disease. Electronically Signed   By: Guadlupe Spanish M.D.   On: 06/25/2020 08:24   MR CERVICAL SPINE W WO CONTRAST  Addendum  Date: 07/05/2020   ADDENDUM REPORT: 07/05/2020 13:24 ADDENDUM: I was subsequently contacted by Dr. Iver Nestle of neurology who directed my attention to subtle dural thickening and enhancement at the C5-C6 level. The dura measures 1-2 mm in thickness at this level. This finding may be postoperative in etiology. Trace dural-based tumor is difficult to definitively exclude. Consider contrast-enhanced MRI follow-up for surveillance. No resultant significant spinal cord mass effect at this level. This addendum was discussed with Dr. Iver Nestle by telephone at approximately 1:30 p.m. on 07/05/2020. Electronically Signed   By: Jackey Loge DO   On: 07/05/2020 13:24   Result Date: 07/05/2020 CLINICAL DATA:  Myelopathy, acute or progressive; left arm weakness. Additional history provided: History of prostate cancer metastatic to bone, status post cervical laminectomy with tumor removal in June 2020. EXAM: MRI CERVICAL SPINE WITHOUT AND WITH CONTRAST TECHNIQUE: Multiplanar and multiecho pulse sequences of the cervical spine, to include the craniocervical junction and cervicothoracic junction, were obtained without and with intravenous contrast. CONTRAST:  49mL GADAVIST GADOBUTROL 1 MMOL/ML IV SOLN COMPARISON:  Radiographs of the cervical spine 02/20/2019. Cervical spine MRI 02/19/2019. Cervical spine MRI 02/20/2019. FINDINGS: Alignment: Reversal of the expected cervical lordosis. No significant spondylolisthesis. Vertebrae: Vertebral body height is maintained. Diffuse metastatic disease throughout the cervical spine. Since the  prior MRI of 02/20/2019, there has been interval C4, C5 and C6 laminectomies for resection of epidural metastatic tumor and microdiscectomy. Multilevel degenerative endplate irregularity. Cord: No spinal cord signal abnormality. Posterior Fossa, vertebral arteries, paraspinal tissues: No abnormality identified within included portions of the posterior fossa. Bilateral mastoid effusions. Flow voids preserved  within the imaged cervical vertebral arteries. Paraspinal soft tissues within normal limits. Unchanged nonspecific 13 mm round T2 hyperintense subcutaneous lesion within the posterior right upper back (series 8, image 36). Disc levels: Unless otherwise stated, the level by level findings below have not significantly changed since prior MRIs of 02/20/2019 and 02/19/2019. Severe disc degeneration at C3-C4, C4-C5, C5-C6, C6-C7 and C7-T1. C2-C3: No significant disc herniation or stenosis. C3-C4: Disc bulge. Uncovertebral and facet hypertrophy. The disc bulge effaces the ventral thecal sac, contacting and minimally flattening the ventral spinal cord with overall mild relative spinal canal narrowing. Moderate/severe left neural foraminal narrowing. C4-C5: Interval laminectomies for resection of previous dorsal epidural tumor. Posterior disc osteophyte complex. Uncinate and facet hypertrophy. No significant spinal canal stenosis. Bilateral neural foraminal narrowing (mild right, moderate left). C5-C6: Interval laminectomies for resection of previous dorsal epidural tumor. Posterior disc osteophyte complex. Uncinate and facet hypertrophy. The disc osteophyte complex minimally effaces the ventral thecal sac without significant spinal canal stenosis. Mild/moderate bilateral neural foraminal narrowing. C6-C7: Posterior disc osteophyte complex. Uncinate and facet hypertrophy. Mild spinal canal stenosis. Bilateral neural foraminal narrowing (mild right, moderate left). C7-T1: This level is imaged sagittally. No significant spinal canal stenosis. Mild bilateral neural foraminal narrowing. IMPRESSION: Since the prior MRI of 02/20/2019, there has been interval C4, C5 and C6 laminectomies for resection of previous dorsal epidural metastatic tumor. No evidence of recurrent epidural tumor at this site. No convincing evidence of epidural metastatic disease within the cervical spine. Redemonstrated diffuse osseous metastatic disease  throughout the cervical spine. Cervical spondylosis as outlined and having not significantly changed from the cervical spine MRI of 02/20/2020. Severe disc degeneration throughout the majority of the cervical spine. No more than mild spinal canal stenosis at any level. At C3-C4, a disc bulge contributes to mild relative spinal canal narrowing, contacting and minimally flattening the ventral spinal cord. Multilevel neural foraminal narrowing greatest on the left at C3-C4 (moderate/severe), on the left at C4-C5 (moderate) and on the left at C6-C7 (moderate). 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 EXAM: PORTABLE CHEST 1 VIEW COMPARISON:  06/28/2020 FINDINGS: The heart size and mediastinal contours are within normal limits. No significant recurrence of left pleural fluid since thoracentesis with probable small pleural effusion remaining. No pneumothorax. Stable bibasilar atelectasis. Stable diffuse sclerotic metastatic disease involving the thoracic spine. IMPRESSION: No significant recurrence of left pleural fluid since thoracentesis with probable small pleural effusion remaining. Stable bibasilar atelectasis. Electronically Signed   By: Aletta Edouard M.D.   On: 07/01/2020 08:03   DG CHEST PORT 1 VIEW  Result Date: 06/28/2020 CLINICAL DATA:  Shortness of breath. EXAM: PORTABLE CHEST 1 VIEW COMPARISON:  06/27/2020. FINDINGS: Mediastinum and heart size normal. Progression of diffuse left lung interstitial infiltrates/edema. Diffuse right lung interstitial infiltrates/edema noted on today's exam. Persistent left-sided pleural effusion without interim change. No pneumothorax. Extensive sclerotic bony metastatic disease again noted. IMPRESSION: 1. Progression of diffuse left lung interstitial infiltrates/edema. Diffuse right lung  interstitial infiltrates/edema noted on today's exam. Persistent left-sided  pleural effusion without interim change. 2. Extensive sclerotic bony metastatic disease again noted. Electronically Signed   By: [place]   On: 06/28/2020 05:52   DG Chest Portable 1 View  Result Date: 06/24/2020 CLINICAL DATA:  Tachycardia. EXAM: PORTABLE CHEST 1 VIEW COMPARISON:  February 19, 2019 FINDINGS: There is cardiomegaly. There are moderate to large bilateral pleural effusions. There is bibasilar atelectasis. There is diffuse sclerosis throughout the visualized osseous structures most notably the thoracic spine. There is no pneumothorax. IMPRESSION: 1. Moderate to large bilateral pleural effusions with adjacent atelectasis. 2. Cardiomegaly. 3. Progressive sclerosis throughout the visualized osseous structures consistent with metastatic disease. Electronically Signed   By: Constance Holster M.D.   On: 06/24/2020 21:33   US LIVER DOPPLER  Result Date: 06/25/2020 CLINICAL DATA:  64 year old male with a history of liver disease EXAM: DUPLEX ULTRASOUND OF LIVER TECHNIQUE: Color and duplex Doppler ultrasound was performed to evaluate the hepatic in-flow and out-flow vessels. COMPARISON:  None. FINDINGS: Portal Vein Velocities Main:  43 cm/sec Right:  28 cm/sec Left:  18 cm/sec Hepatic Vein Velocities Right:  77 cm/sec Middle:  78 cm/sec Left:  92 cm/sec Hepatic Artery Velocity:  135 cm/sec Splenic Vein Velocity:  24 cm/sec Varices: Absent Ascites: Absent Spleen volume estimated 52 cubic cm Pleural fluid bilaterally IMPRESSION: Unremarkable duplex of the hepatic vasculature. Bilateral pleural effusions Electronically Signed   By: Corrie Mckusick D.O.   On: 06/25/2020 14:47   ECHOCARDIOGRAM COMPLETE BUBBLE STUDY  Result Date: 06/25/2020    ECHOCARDIOGRAM REPORT   Patient Name:   BAZIL DHANANI Date of Exam: 06/25/2020 Medical Rec #:  [id number]    Height:       73.0 in Accession #:    [id number]   Weight:       150.0 lb Date  of Birth:  1956/06/21    BSA:          1.904 m Patient Age:    23 years     BP:           111/75 mmHg Patient Gender: M            HR:           126 bpm. Exam Location:  Inpatient Procedure: 2D Echo, Cardiac Doppler, Color Doppler and Saline Contrast Bubble            Study Indications:    Atrial fibrillation with rapid ventricular response ([place])  History:        Patient has no prior history of Echocardiogram examinations.  Sonographer:    Bernadene Person RDCS Referring Phys: [id number] Frost  1. Left ventricular ejection fraction, by estimation, is 50 to 55%. The left ventricle has low normal function. The left ventricle has no regional wall motion abnormalities. Left ventricular diastolic parameters were normal. There is the interventricular septum is flattened in diastole ('D' shaped left ventricle), consistent with right ventricular volume overload.  2. Right ventricular systolic function is mildly reduced. The right ventricular size is mildly enlarged. There is mildly elevated pulmonary artery systolic pressure. The estimated right ventricular systolic pressure is 34.1 mmHg.  3. Left atrial size was mildly dilated.  4. Right atrial size was mildly dilated.  5. Large pleural effusion in the left lateral region.  6. The mitral valve is normal in structure. Mild mitral valve regurgitation.  7. Tricuspid valve regurgitation is mild to moderate.  8. The aortic valve is normal in structure. Aortic valve regurgitation is not visualized.  Mild aortic valve sclerosis is present, with no evidence of aortic valve stenosis.  9. The inferior vena cava is dilated in size with <50% respiratory variability, suggesting right atrial pressure of 15 mmHg. 10. Evidence of atrial level shunting detected by color flow Doppler. Agitated saline contrast bubble study was positive with shunting observed within 3-6 cardiac cycles suggestive of interatrial shunt. Comparison(s): No prior Echocardiogram. Findings are  suggestive of right heart volume overload due to left-to-right shunt and there is a small right to left interatrial shunt by saline contrast study. However, an atrial septal defect could not be outlined with 2D and color Doppler imaging. Consider TEE or CT angiography to evaluate for anormalous pulmonary vein return or occult ASD. Qp:Qs shunt fraction could not be accurately calculated due to poor quality RV outflow imaging. FINDINGS  Left Ventricle: Left ventricular ejection fraction, by estimation, is 50 to 55%. The left ventricle has low normal function. The left ventricle has no regional wall motion abnormalities. The left ventricular internal cavity size was normal in size. There is no left ventricular hypertrophy. The interventricular septum is flattened in diastole ('D' shaped left ventricle), consistent with right ventricular volume overload. Left ventricular diastolic parameters were normal. Normal left ventricular filling pressure. Right Ventricle: The right ventricular size is mildly enlarged. No increase in right ventricular wall thickness. Right ventricular systolic function is mildly reduced. There is mildly elevated pulmonary artery systolic pressure. The tricuspid regurgitant  velocity is 2.60 m/s, and with an assumed right atrial pressure of 15 mmHg, the estimated right ventricular systolic pressure is 82.5 mmHg. Left Atrium: Left atrial size was mildly dilated. Right Atrium: Right atrial size was mildly dilated. Pericardium: There is no evidence of pericardial effusion. Mitral Valve: The mitral valve is normal in structure. Mild mitral valve regurgitation. Tricuspid Valve: The tricuspid valve is grossly normal. Tricuspid valve regurgitation is mild to moderate. Aortic Valve: The aortic valve is normal in structure. Aortic valve regurgitation is not visualized. Mild aortic valve sclerosis is present, with no evidence of aortic valve stenosis. Pulmonic Valve: The pulmonic valve was not well  visualized. Pulmonic valve regurgitation is not visualized. Aorta: The aortic root and ascending aorta are structurally normal, with no evidence of dilitation. Venous: The inferior vena cava is dilated in size with less than 50% respiratory variability, suggesting right atrial pressure of 15 mmHg. IAS/Shunts: There is redundancy of the interatrial septum. Evidence of atrial level shunting detected by color flow Doppler. Agitated saline contrast was given intravenously to evaluate for intracardiac shunting. Agitated saline contrast bubble study was  positive with shunting observed within 3-6 cardiac cycles suggestive of interatrial shunt. Additional Comments: There is a large pleural effusion in the left lateral region.  LEFT VENTRICLE PLAX 2D LVIDd:         4.10 cm     Diastology LVIDs:         3.00 cm     LV e' medial:    13.40 cm/s LV PW:         1.00 cm     LV E/e' medial:  6.5 LV IVS:        0.90 cm     LV e' lateral:   9.00 cm/s LVOT diam:     2.10 cm     LV E/e' lateral: 9.7 LV SV:         68 LV SV Index:   36 LVOT Area:     3.46 cm  LV Volumes (MOD) LV vol d,  MOD A2C: 63.8 ml LV vol d, MOD A4C: 58.6 ml LV vol s, MOD A2C: 32.5 ml LV vol s, MOD A4C: 27.6 ml LV SV MOD A2C:     31.3 ml LV SV MOD A4C:     58.6 ml LV SV MOD BP:      32.7 ml RIGHT VENTRICLE RV S prime:     6.85 cm/s TAPSE (M-mode): 1.7 cm LEFT ATRIUM             Index       RIGHT ATRIUM           Index LA diam:        4.00 cm 2.10 cm/m  RA Area:     21.40 cm LA Vol (A2C):   37.5 ml 19.70 ml/m RA Volume:   61.60 ml  32.35 ml/m LA Vol (A4C):   53.1 ml 27.89 ml/m LA Biplane Vol: 44.8 ml 23.53 ml/m  AORTIC VALVE             PULMONIC VALVE LVOT Vmax:   89.40 cm/s  RVOT Peak grad: 2 mmHg LVOT Vmean:  65.500 cm/s LVOT VTI:    0.197 m  AORTA Ao Root diam: 2.70 cm Ao Asc diam:  2.90 cm MITRAL VALVE               TRICUSPID VALVE MV Area (PHT): 3.77 cm    TR Peak grad:   27.0 mmHg MV Decel Time: 201 msec    TR Vmax:        260.00 cm/s MV E velocity:  87.50 cm/s MV A velocity: 90.80 cm/s  SHUNTS MV E/A ratio:  0.96        Systemic VTI:  0.20 m                            Systemic Diam: 2.10 cm                            Pulmonic VTI:  0.087 m Dani Gobble Croitoru MD Electronically signed by Sanda Klein MD Signature Date/Time: 06/25/2020/12:54:37 PM    Final    VAS [name] LOWER EXTREMITY VENOUS (DVT)  Result Date: 07/14/2020  Lower Venous DVT Study Indications: Swelling.  Limitations: Poor ultrasound/tissue interface. Comparison Study: No prior studies. Performing Technologist: Oliver Hum RVT  Examination Guidelines: A complete evaluation includes B-mode imaging, spectral Doppler, color Doppler, and power Doppler as needed of all accessible portions of each vessel. Bilateral testing is considered an integral part of a complete examination. Limited examinations for reoccurring indications may be performed as noted. The reflux portion of the exam is performed with the patient in reverse Trendelenburg.  +---------+---------------+---------+-----------+----------+--------------+ RIGHT    CompressibilityPhasicitySpontaneityPropertiesThrombus Aging +---------+---------------+---------+-----------+----------+--------------+ CFV      Full           Yes      Yes                                 +---------+---------------+---------+-----------+----------+--------------+ SFJ      Full                                                        +---------+---------------+---------+-----------+----------+--------------+  FV Prox  Full                                                        +---------+---------------+---------+-----------+----------+--------------+ FV Mid   Full                                                        +---------+---------------+---------+-----------+----------+--------------+ FV DistalFull                                                         +---------+---------------+---------+-----------+----------+--------------+ PFV      Full                                                        +---------+---------------+---------+-----------+----------+--------------+ POP      Full           Yes      Yes                                 +---------+---------------+---------+-----------+----------+--------------+ PTV      Full                                                        +---------+---------------+---------+-----------+----------+--------------+ PERO     Full                                                        +---------+---------------+---------+-----------+----------+--------------+   +---------+---------------+---------+-----------+----------+--------------+ LEFT     CompressibilityPhasicitySpontaneityPropertiesThrombus Aging +---------+---------------+---------+-----------+----------+--------------+ CFV      Full           Yes      Yes                                 +---------+---------------+---------+-----------+----------+--------------+ SFJ      Full                                                        +---------+---------------+---------+-----------+----------+--------------+ FV Prox  Full                                                        +---------+---------------+---------+-----------+----------+--------------+  FV Mid   Full                                                        +---------+---------------+---------+-----------+----------+--------------+ FV DistalFull                                                        +---------+---------------+---------+-----------+----------+--------------+ PFV      Full                                                        +---------+---------------+---------+-----------+----------+--------------+ POP      Full           Yes      Yes                                  +---------+---------------+---------+-----------+----------+--------------+ PTV      Full                                                        +---------+---------------+---------+-----------+----------+--------------+ PERO     Full                                                        +---------+---------------+---------+-----------+----------+--------------+     Summary: RIGHT: - There is no evidence of deep vein thrombosis in the lower extremity.  - No cystic structure found in the popliteal fossa.  LEFT: - There is no evidence of deep vein thrombosis in the lower extremity.  - No cystic structure found in the popliteal fossa.  *See table(s) above for measurements and observations. Electronically signed by Ruta Hinds MD on 07/14/2020 at 10:12:25 AM.    Final    ECHOCARDIOGRAM LIMITED  Result Date: 07/12/2020    ECHOCARDIOGRAM LIMITED REPORT   Patient Name:   SALAH NAKAMURA Date of Exam: 07/12/2020 Medical Rec #:  [id number]    Height:       73.0 in Accession #:    [id number]   Weight:       159.0 lb Date of Birth:  11-21-1955    BSA:          1.951 m Patient Age:    47 years     BP:           116/65 mmHg Patient Gender: M            HR:           87 bpm. Exam Location:  Inpatient Procedure: Limited Echo, Limited Color Doppler and Cardiac Doppler Indications:     atrial fibrillation  History:  Patient has no prior history of Echocardiogram examinations.  Sonographer:     Delcie Roch Referring Phys:  [id number] Manson Passey Diagnosing Phys: Marca Ancona MD IMPRESSIONS  1. Left ventricular ejection fraction, by estimation, is 60 to 65%. The left ventricle has normal function. There is mild left ventricular hypertrophy. Left ventricular diastolic parameters are indeterminate.  2. Right ventricular systolic function is normal. The right ventricular size is mildly enlarged. There is mildly elevated pulmonary artery systolic pressure. The estimated right ventricular systolic  pressure is 41.0 mmHg. Mildly D-shaped interventricular septum suggests elevated RV pressure.  3. Left atrial size was moderately dilated.  4. Right atrial size was moderately dilated.  5. The mitral valve is normal in structure. Trivial mitral valve regurgitation. No evidence of mitral stenosis.  6. The aortic valve is tricuspid. Aortic valve regurgitation is not visualized. Mild aortic valve sclerosis is present, with no evidence of aortic valve stenosis.  7. The inferior vena cava is dilated in size with <50% respiratory variability, suggesting right atrial pressure of 15 mmHg.  8. Significant left pleural effusion. FINDINGS  Left Ventricle: Left ventricular ejection fraction, by estimation, is 60 to 65%. The left ventricle has normal function. The left ventricular internal cavity size was normal in size. There is mild left ventricular hypertrophy. Left ventricular diastolic  parameters are indeterminate. Right Ventricle: The right ventricular size is mildly enlarged. No increase in right ventricular wall thickness. Right ventricular systolic function is normal. There is mildly elevated pulmonary artery systolic pressure. The tricuspid regurgitant velocity is 2.55 m/s, and with an assumed right atrial pressure of 15 mmHg, the estimated right ventricular systolic pressure is 41.0 mmHg. Left Atrium: Left atrial size was moderately dilated. Right Atrium: Right atrial size was moderately dilated. Pericardium: Significant left pleural effusion. Trivial pericardial effusion is present. Mitral Valve: The mitral valve is normal in structure. Trivial mitral valve regurgitation. No evidence of mitral valve stenosis. Tricuspid Valve: The tricuspid valve is normal in structure. Tricuspid valve regurgitation is mild. Aortic Valve: The aortic valve is tricuspid. Aortic valve regurgitation is not visualized. Mild aortic valve sclerosis is present, with no evidence of aortic valve stenosis. Venous: The inferior vena cava is  dilated in size with less than 50% respiratory variability, suggesting right atrial pressure of 15 mmHg. IAS/Shunts: No atrial level shunt detected by color flow Doppler. LEFT VENTRICLE PLAX 2D LVIDd:         4.50 cm LVIDs:         3.40 cm LV PW:         1.10 cm LV IVS:        0.70 cm  IVC IVC diam: 2.30 cm LEFT ATRIUM         Index LA diam:    4.30 cm 2.20 cm/m  TRICUSPID VALVE TR Peak grad:   26.0 mmHg TR Vmax:        255.00 cm/s Marca Ancona MD Electronically signed by Marca Ancona MD Signature Date/Time: 07/12/2020/4:21:33 PM    Final (Updated)    IR THORACENTESIS ASP PLEURAL SPACE W/IMG GUIDE  Result Date: 06/28/2020 INDICATION: Patient with a history of prostate cancer with extensive metastases now has new onset pleural effusions. Interventional radiology asked to perform a therapeutic and diagnostic thoracentesis. EXAM: ULTRASOUND GUIDED THORACENTESIS MEDICATIONS: 1% lidocaine 10 mL COMPLICATIONS: None immediate. PROCEDURE: An ultrasound guided thoracentesis was thoroughly discussed with the patient and questions answered. The benefits, risks, alternatives and complications were also discussed. The patient understands and wishes to proceed  with the procedure. Written consent was obtained. Ultrasound was performed to localize and mark an adequate pocket of fluid in the left chest. The area was then prepped and draped in the normal sterile fashion. 1% Lidocaine was used for local anesthesia. Under ultrasound guidance a 6 Fr Safe-T-Centesis catheter was introduced. Thoracentesis was performed. The catheter was removed and a dressing applied. FINDINGS: A total of approximately 1.3 L of bright yellow fluid was removed. Samples were sent to the laboratory as requested by the clinical team. IMPRESSION: Successful ultrasound guided left thoracentesis yielding 1.3 L of pleural fluid. Read by: Soyla Dryer, NP Electronically Signed   By: Corrie Mckusick D.O.   On: 06/28/2020 12:11   IR THORACENTESIS ASP  PLEURAL SPACE W/IMG GUIDE  Result Date: 06/27/2020 INDICATION: Patient with a history of prostate cancer with extensive metastases now has new onset pleural effusions. Interventional radiology asked to perform a therapeutic and diagnostic thoracentesis. EXAM: ULTRASOUND GUIDED THORACENTESIS MEDICATIONS: 1% lidocaine 20 mL COMPLICATIONS: None immediate. PROCEDURE: An ultrasound guided thoracentesis was thoroughly discussed with the patient and questions answered. The benefits, risks, alternatives and complications were also discussed. The patient understands and wishes to proceed with the procedure. Written consent was obtained. Ultrasound was performed to localize and mark an adequate pocket of fluid in the right chest. The area was then prepped and draped in the normal sterile fashion. 1% Lidocaine was used for local anesthesia. Under ultrasound guidance a 6 Fr Safe-T-Centesis catheter was introduced. Thoracentesis was performed. The catheter was removed and a dressing applied. FINDINGS: A total of approximately 1.6 L of dark yellow fluid was removed. Samples were sent to the laboratory as requested by the clinical team. IMPRESSION: Successful ultrasound guided right thoracentesis yielding 1.6 L of pleural fluid. Read by: Soyla Dryer, NP Electronically Signed   By: Markus Daft M.D.   On: 06/27/2020 12:29     Time Spent in minutes  30     Desiree Hane M.D on 07/16/2020 at 2:13 PM  To page go to [email or web address] - password [facility]

## 2020-07-16 NOTE — Discharge Instructions (Signed)

## 2020-07-17 ENCOUNTER — Ambulatory Visit: Payer: 59 | Admitting: Oncology

## 2020-07-17 LAB — CBC WITH DIFFERENTIAL/PLATELET
Abs Immature Granulocytes: 0.05 10*3/uL (ref 0.00–0.07)
Basophils Absolute: 0.1 10*3/uL (ref 0.0–0.1)
Basophils Relative: 1 %
Eosinophils Absolute: 0.1 10*3/uL (ref 0.0–0.5)
Eosinophils Relative: 3 %
HCT: 32 % — ABNORMAL LOW (ref 39.0–52.0)
Hemoglobin: 9.5 g/dL — ABNORMAL LOW (ref 13.0–17.0)
Immature Granulocytes: 1 %
Lymphocytes Relative: 26 %
Lymphs Abs: 1.2 10*3/uL (ref 0.7–4.0)
MCH: 29 pg (ref 26.0–34.0)
MCHC: 29.7 g/dL — ABNORMAL LOW (ref 30.0–36.0)
MCV: 97.6 fL (ref 80.0–100.0)
Monocytes Absolute: 0.6 10*3/uL (ref 0.1–1.0)
Monocytes Relative: 14 %
Neutro Abs: 2.4 10*3/uL (ref 1.7–7.7)
Neutrophils Relative %: 55 %
Platelets: 256 10*3/uL (ref 150–400)
RBC: 3.28 MIL/uL — ABNORMAL LOW (ref 4.22–5.81)
RDW: 22.9 % — ABNORMAL HIGH (ref 11.5–15.5)
WBC: 4.4 10*3/uL (ref 4.0–10.5)
nRBC: 1.4 % — ABNORMAL HIGH (ref 0.0–0.2)

## 2020-07-17 LAB — BASIC METABOLIC PANEL
Anion gap: 7 (ref 5–15)
BUN: 27 mg/dL — ABNORMAL HIGH (ref 8–23)
CO2: 27 mmol/L (ref 22–32)
Calcium: 8.7 mg/dL — ABNORMAL LOW (ref 8.9–10.3)
Chloride: 104 mmol/L (ref 98–111)
Creatinine, Ser: 0.74 mg/dL (ref 0.61–1.24)
GFR, Estimated: >60 mL/min (ref >60)
Glucose, Bld: 94 mg/dL (ref 70–99)
Potassium: 4 mmol/L (ref 3.5–5.1)
Sodium: 138 mmol/L (ref 135–145)

## 2020-07-17 NOTE — Plan of Care (Signed)
°  Problem: Education: °Goal: Ability to demonstrate management of disease process will improve °Outcome: Progressing °Goal: Ability to verbalize understanding of medication therapies will improve °Outcome: Progressing °Goal: Individualized Educational Video(s) °Outcome: Progressing °  °

## 2020-07-17 NOTE — Progress Notes (Signed)
Occupational Therapy Treatment Patient Details Name: Chris Morales MRN: [id number] DOB: 1956-08-28 Today's Date: 07/17/2020    History of present illness 64 year old male with presented to [facility] with complaints of tachycardia, lower extremity edema and left-sided weakness. Pt adm with afib with rvr, acute hypoxic respiratory failure likely secondary to malignant bil pleural effusions. Pt had recently been hospitalized in [place] with afib with rvr and resp failure requiring vent support and then left AMA before flying back to [facility] and presenting immediately to ED. 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 MRI - thickening and enhancement along the posterior falx. New hyperintensity in R parietal lobe that may reflect associated edema.  PMH including prostate CA with bony mets, C3-4 laminectomy with tumor removal 01/2019, and afib.   OT comments  Pt received sitting up in chair, awake and alert, agreeable to session to address UE HEP and functional transfers with no AE for simulated toilet transfer and activity tolerance. Pt tolerated session well and progressing toward established goals. No signs of pain for RUE, however, still limited with weakness and ROM. Pt will benefit to continued acute OT for HEP and continued DC to outpatient OT to improve strength and ROM for UE engage in ADLs.   Follow Up Recommendations  Outpatient OT (OP OT and Orthopedic consult)    Equipment Recommendations  None recommended by OT    Recommendations for Other Services Rehab consult    Precautions / Restrictions Precautions Precautions: Fall Precaution Comments: Edema in BLE, RTC injury R shoulder       Mobility Bed Mobility Overal bed mobility: Modified Independent             General bed mobility comments: pt received in bed upon arrival.  Transfers Overall transfer level: Independent Equipment used: None Transfers: Sit to/from Stand Sit to Stand: Independent          General transfer comment: pt able to progress to power up of min guard assist with RW. Use of bil hand rests to push    Balance Overall balance assessment: Mild deficits observed, not formally tested Sitting-balance support: Feet supported Sitting balance-Leahy Scale: Good Sitting balance - Comments: apparent weak core   Standing balance support: During functional activity;No upper extremity supported Standing balance-Leahy Scale: Good Standing balance comment: no UE support, no overt Lob                           ADL either performed or assessed with clinical judgement   ADL Overall ADL's : Needs assistance/impaired Eating/Feeding: Independent;Sitting                       Toilet Transfer: Modified Independent Toilet Transfer Details (indicate cue type and reason): simulated toilet transfer from chair <> chair with mobilization in room. increased time for tolerance.          Functional mobility during ADLs: Modified independent General ADL Comments: Session mostly address HEP and RUE (RTC) exercises.     Vision       Perception     Praxis      Cognition Arousal/Alertness: Awake/alert Behavior During Therapy: WFL for tasks assessed/performed Overall Cognitive Status: Within Functional Limits for tasks assessed Area of Impairment: Problem solving                 Orientation Level: Disoriented to;Situation Current Attention Level: Focused   Following Commands: Follows one step commands consistently Safety/Judgement: Decreased  awareness of deficits Awareness: Intellectual Problem Solving: Slow processing;Requires verbal cues;Requires tactile cues          Exercises Exercises: General Upper Extremity;General Lower Extremity General Exercises - Upper Extremity Shoulder Flexion: AAROM;10 reps;Standing (table slides with towel) Shoulder ABduction: AAROM;10 reps;Seated;Right (table slides with towel) Other Exercises Other Exercises:  Scapular adduction; 10x; seated Other Exercises: shoulder shrugs 10x seated Other Exercises: shoulder forward rotation 10 x seated Other Exercises: shoulder backward rotation 10x seated   Shoulder Instructions       General Comments RA 98-99% SpO2    Pertinent Vitals/ Pain       Pain Assessment: No/denies pain Pain Location: legs with mobility; hips Pain Descriptors / Indicators: Grimacing Pain Intervention(s): Monitored during session  Home Living                                          Prior Functioning/Environment              Frequency  Min 2X/week        Progress Toward Goals  OT Goals(current goals can now be found in the care plan section)  Progress towards OT goals: Progressing toward goals  Acute Rehab OT Goals Patient Stated Goal: to go home OT Goal Formulation: With patient Time For Goal Achievement: 07/27/20 Potential to Achieve Goals: Good ADL Goals Pt Will Perform Lower Body Bathing: with min assist;sitting/lateral leans;sit to/from stand Pt Will Perform Lower Body Dressing: with min assist;sitting/lateral leans;sit to/from stand Pt Will Transfer to Toilet: with min assist;ambulating;regular height toilet;grab bars Pt/caregiver will Perform Home Exercise Program: Increased strength;Both right and left upper extremity;With theraband;With written HEP provided Additional ADL Goal #1: Pt will follow multistep ADL commands with min VC's  Plan Discharge plan remains appropriate;Frequency remains appropriate    Co-evaluation                 AM-PAC OT "6 Clicks" Daily Activity     Outcome Measure   Help from another person eating meals?: None Help from another person taking care of personal grooming?: None Help from another person toileting, which includes using toliet, bedpan, or urinal?: A Little Help from another person bathing (including washing, rinsing, drying)?: A Little Help from another person to put on and taking  off regular upper body clothing?: A Little Help from another person to put on and taking off regular lower body clothing?: A Little 6 Click Score: 20    End of Session    OT Visit Diagnosis: Unsteadiness on feet (R26.81);Other abnormalities of gait and mobility (R26.89);Muscle weakness (generalized) (M62.81);Other symptoms and signs involving cognitive function Pain - part of body: Hip (at times"; discomfort "it's a little tight")   Activity Tolerance Patient tolerated treatment well   Patient Left in chair;with call bell/phone within reach   Nurse Communication Mobility status        Time: 1511-1536 OT Time Calculation (min): 25 min  Charges: OT General Charges $OT Visit: 1 Visit OT Treatments $Therapeutic Exercise: 23-37 mins  Minus Breeding, MSOT, OTR/L  Supplemental Rehabilitation Services  [phone]    Marius Ditch 07/17/2020, 3:58 PM

## 2020-07-17 NOTE — Progress Notes (Signed)
[facility]  PROGRESS NOTE  Shaune Westfall WIO:[id number] DOB: 10-21-1955 DOA: 07/12/2020 PCP: Patient, No Pcp Per Admit date - 07/12/2020   Admitting Physician Charlann Lange, MD  Outpatient Primary MD for the patient is Patient, No Pcp Per  LOS - 5 Brief Narrative  Mr. Bradish is a 64 year old male with medical history significant for metastatic prostate cancer, persistent A. fib, chronic diastolic CHF with recent hospitalization on 42/68-34/1 for diastolic CHF exacerbation exudative bilateral effusions requiring thoracentesis (10/28, 10/29) and metastatic prostate cancer, with bony metastatic disease and concern for dural based metastasis who has been receiving inpatient rehabilitation embolus, inpatient rehab since 11/5.  Patient was seen by cardiology on 11/12 for evaluation at 8 PM and was noted to be significantly volume overloaded with high concern for acute exacerbation of diastolic heart failure with need for IV diuresis.  Patient was readmitted to [facility] on 11/12 by [facility] service.  Patient continues to have moderate volume overloaded with ongoing dyspnea and atrial fibrillation with RVR, further complicated by hypoalbuminemia.  Patient also has moderate pulmonary hypertension with signs of right ventricular failure but unclear etiology.  CTA chest from earlier hospitalization was negative for PE.  Venous duplex negative for DVTs   Subjective  No acute issues or events overnight, continues to have moderate dyspnea with exertion and orthopnea but generally improving since admission.  Resting in the recliner overnight due to orthopnea.  Otherwise denies chest pain, headache, fevers, chills, nausea, vomiting, diarrhea, constipation.  A & P   Acute on chronic CHF with preserved ejection fraction exacerbation.  -Cardiology following, appreciate insight and recommendations  -Continue IV diuresis for additional 24 hours, likely transition to p.o. in the next 1 to 2 days pending urine output  and volume status as well as symptoms  -I's and O's remain net negative, diuresing quite well, clinically patient appears to be somewhat euvolemic but continues to have symptoms of orthopnea.   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 - No PE on last CTA. No hx of OSA. No prior lung comorbidities.  Venous duplex with no DVTs -no current indication for V/Q given patient without hypoxia  Hypotension, transient in the setting of decreased cardiac output. SBP stable in 90s-100.  -tolerating IV lasix and toprol (will place hold parameters), closely monitor  Chronic atrial fibrillation, currently rate controlled.  Had intermittent RVR to 115, but now rate controlled 70s to 80s -Continue diltiazem, digoxin, Toprol, Eliquis -Monitor on telemetry  Bilateral pleural effusions, stable.   - Previously exudative based on thoracentesis on 10/28, 10/29.  Cytology at that time was negative for malignancy.   -Repeat CT chest on 11/13. showed small to moderate-sized bilateral pleural effusions with dependent bilateral lower lobe collapse/consolidation.  -Hold off on further imaging unless clinically patient changes  Prostate cancer with widespread bony metastases. - History of C4/C5/C6 laminectomies for resection previous dorsal epidural metastatic tumor MRI of the brain more concerning for meningioma per neurology status post Lupron injection last hospitalization -Tolerating Casodex per oncology -Repeat scan in 2 months to be reviewed by CNS tumor board per discussion with oncology -Follow-up with Dr. [name] on 07/17/2020, will need to be rescheduled  Hypothyroidism, stable -Continue Synthroid 25 mcg  Family Communication  :  none Code Status : FULL  Disposition Plan  :  Patient is from home. Anticipated d/c date: 2 to 3 days. Barriers to d/c or necessity for inpatient status: IV lasix for CHF exacerbation as he  is signficantly volume  overloaded still with persistent orthopnea and responding to therapy Consults  :  Cardiology  Procedures  :  none  DVT Prophylaxis  :  Eliquis  MDM: The below labs and imaging reports were reviewed and summarized above.  Medication management as above.  Lab Results  Component Value Date   PLT 256 07/17/2020    Diet :  Diet Order            Diet Heart Room service appropriate? Yes; Fluid consistency: Thin  Diet effective now                  Inpatient Medications Scheduled Meds: . apixaban  5 mg Oral BID  . bicalutamide  50 mg Oral Daily  . digoxin  0.125 mg Oral Daily  . diltiazem  120 mg Oral Daily  . folic acid  1 mg Oral Daily  . furosemide  60 mg Intravenous BID  . levothyroxine  25 mcg Oral QAC breakfast  . metoprolol succinate  100 mg Oral BID   Continuous Infusions: PRN Meds:.acetaminophen, ondansetron **OR** ondansetron (ZOFRAN) IV, polyethylene glycol  Antibiotics  :   Anti-infectives (From admission, onward)   None       Objective   Vitals:   07/16/20 2210 07/17/20 0516 07/17/20 0520 07/17/20 0722  BP: (!) 99/58 95/63  102/63  Pulse: 81 (!) 58  (!) 58  Resp: 17   20  Temp: 98.5 F (36.9 C) 98 F (36.7 C)  98 F (36.7 C)  TempSrc: Oral Oral  Oral  SpO2: 100% 100%  100%  Weight:   64.9 kg   Height:        SpO2: 100 %  Wt Readings from Last 3 Encounters:  07/17/20 64.9 kg  07/06/20 72.1 kg  07/05/20 70.6 kg     Intake/Output Summary (Last 24 hours) at 07/17/2020 0736 Last data filed at 07/17/2020 0521 Gross per 24 hour  Intake 1320 ml  Output 4550 ml  Net -3230 ml   Physical Exam: General:  Pleasantly resting in bed, No acute distress. HEENT:  Normocephalic atraumatic.  Sclerae nonicteric, noninjected.  Extraocular movements intact bilaterally. Neck:  Without mass or deformity.  Trachea is midline. Lungs:  Clear to auscultate bilaterally without rhonchi, wheeze, or rales. Heart:  Regular rate and rhythm.  Without murmurs,  rubs, or gallops. Abdomen:  Soft, nontender, nondistended.  Without guarding or rebound. Extremities: Without cyanosis, clubbing, edema, or obvious deformity. Vascular:  Dorsalis pedis and posterior tibial pulses palpable bilaterally. Skin:  Warm and dry, no erythema, no ulcerations.  Data Reviewed:  CBC Recent Labs  Lab 07/13/20 0447 07/14/20 0757 07/15/20 0554 07/16/20 0404 07/17/20 0542  WBC 5.7 4.3 4.2 4.1 4.4  HGB 9.0* 9.4* 8.5* 8.9* 9.5*  HCT 30.3* 31.7* 28.2* 29.8* 32.0*  PLT 293 275 277 266 256  MCV 96.2 96.6 96.6 97.4 97.6  MCH 28.6 28.7 29.1 29.1 29.0  MCHC 29.7* 29.7* 30.1 29.9* 29.7*  RDW 23.1* 23.0* 22.7* 22.8* 22.9*  LYMPHSABS  --   --  1.1 1.1 1.2  MONOABS  --   --  0.6 0.6 0.6  EOSABS  --   --  0.1 0.1 0.1  BASOSABS  --   --  0.1 0.1 0.1    Chemistries  Recent Labs  Lab 07/12/20 1512 07/12/20 1512 07/13/20 0447 07/14/20 0757 07/15/20 0554 07/16/20 0404 07/17/20 0542  NA 139   < > 137 136 137 140 138  K 4.3   < >  4.0 4.1 3.8 4.4 4.0  CL 106   < > 102 101 101 103 104  CO2 21*   < > 29 27 26 27 27   GLUCOSE 103*   < > 94 81 92 97 94  BUN 19   < > 22 18 22  27* 27*  CREATININE 0.87   < > 0.79 0.69 0.73 0.76 0.74  CALCIUM 8.1*   < > 8.4* 8.5* 8.3* 8.4* 8.7*  AST 17  --  16  --   --   --   --   ALT 17  --  17  --   --   --   --   ALKPHOS 2,412*  --  2,382*  --   --   --   --   BILITOT 0.9  --  1.1  --   --   --   --    < > = values in this interval not displayed.   ------------------------------------------------------------------------------------------------------------------ No results for input(s): CHOL, HDL, LDLCALC, TRIG, CHOLHDL, LDLDIRECT in the last 72 hours.  Lab Results  Component Value Date   HGBA1C 5.8 (H) 06/25/2020   ------------------------------------------------------------------------------------------------------------------ No results for input(s): TSH, T4TOTAL, T3FREE, THYROIDAB in the last 72 hours.  Invalid input(s):  FREET3 ------------------------------------------------------------------------------------------------------------------ No results for input(s): VITAMINB12, FOLATE, FERRITIN, TIBC, IRON, RETICCTPCT in the last 72 hours.  Coagulation profile Recent Labs  Lab 07/13/20 0447  INR 1.8*    No results for input(s): DDIMER in the last 72 hours.  Cardiac Enzymes No results for input(s): CKMB, TROPONINI, MYOGLOBIN in the last 168 hours.  Invalid input(s): CK ------------------------------------------------------------------------------------------------------------------    Component Value Date/Time   BNP 271.0 (H) 07/12/2020 1512    Micro Results No results found for this or any previous visit (from the past 240 hour(s)).  Radiology Reports DG Chest 1 View  Result Date: 07/12/2020 CLINICAL DATA:  Shortness of breath EXAM: CHEST  1 VIEW COMPARISON:  July 01, 2020.  History of prostate carcinoma FINDINGS: There is airspace consolidation in the left base region. Lungs elsewhere clear. Heart is upper normal in size with pulmonary vascularity normal. No adenopathy. Sclerotic bony metastases noted throughout the thoracic region. IMPRESSION: Airspace opacity left base consistent with pneumonia. Lungs elsewhere clear. Heart upper normal in size. Widespread bony metastases noted throughout the thoracic region. Electronically Signed   By: Lowella Grip III M.D.   On: 07/12/2020 14:24   DG Chest 1 View  Result Date: 06/28/2020 CLINICAL DATA:  Left thoracentesis. EXAM: CHEST  1 VIEW COMPARISON:  06/28/2020. FINDINGS: Mediastinum and hilar structures normal. Stable cardiomegaly. Bilateral interstitial prominence again noted with interim improvement from prior exam. Findings suggests improving CHF. Improved left pleural effusion. Tiny right pleural effusion. No pneumothorax post thoracentesis. IMPRESSION: Findings consistent with improving CHF. Improved left pleural effusion. No pneumothorax  post thoracentesis. Tiny right pleural effusion. Electronically Signed   By: Marcello Moores  Register   On: 06/28/2020 10:55   DG Chest 1 View  Result Date: 06/27/2020 CLINICAL DATA:  Post right thoracentesis. EXAM: CHEST  1 VIEW COMPARISON:  06/24/2020 FINDINGS: Interval improvement in right pleural effusion. No pneumothorax. Improved aeration right lung base Interval progression of left pleural effusion and left lower lobe atelectasis Extensive sclerotic bony changes compatible with metastatic prostate cancer. IMPRESSION: Improved right pleural effusion with no pneumothorax post thoracentesis Progression of left effusion and left lower lobe atelectasis. Bony metastatic disease. Electronically Signed   By: Franchot Gallo M.D.   On: 06/27/2020 12:06   DG  Chest 2 View  Result Date: 07/12/2020 CLINICAL DATA:  Shortness of breath.  Metastatic prostate cancer. EXAM: CHEST - 2 VIEW COMPARISON:  07/12/2020 FINDINGS: Diffuse bony sclerosis compatible with diffuse osseous metastatic disease. Moderate bilateral pleural effusions with associated passive atelectasis. Airway thickening is present, suggesting bronchitis or reactive airways disease. Heart size within normal limits. IMPRESSION: 1. Moderate bilateral pleural effusions with associated passive atelectasis. 2. Airway thickening is present, suggesting bronchitis or reactive airways disease. 3. Diffuse sclerotic osseous metastatic disease. Electronically Signed   By: Van Clines M.D.   On: 07/12/2020 18:24   DG Shoulder Right  Result Date: 07/08/2020 CLINICAL DATA:  Shoulder weakness, bone metastasis, torn rotator cuff EXAM: RIGHT SHOULDER - 2+ VIEW COMPARISON:  Radiograph 02/20/2014 FINDINGS: A diffusely mottled sclerotic appearance of the bones is compatible with known osseous metastatic disease. There is a high-riding appearance of the humeral head which is suggestive of underlying rotator cuff insufficiency particularly in the setting of known rotator  cuff tear. Moderate arthrosis of the glenohumeral and acromioclavicular joints. No acute traumatic osseous injury is present at this time. IMPRESSION: 1. Diffusely mottled sclerotic appearance of the bones compatible with known osseous metastatic disease. 2. High-riding appearance of the humeral head is suggestive of underlying rotator cuff insufficiency, particularly in the setting of known rotator cuff tear. 3. No acute fracture or traumatic malalignment. Electronically Signed   By: Lovena Le M.D.   On: 07/08/2020 19:23   CT CHEST W CONTRAST  Result Date: 07/13/2020 CLINICAL DATA:  Pleural effusion on chest x-ray. EXAM: CT CHEST WITH CONTRAST TECHNIQUE: Multidetector CT imaging of the chest was performed during intravenous contrast administration. CONTRAST:  51mL OMNIPAQUE IOHEXOL 300 MG/ML  SOLN COMPARISON:  06/25/2020 FINDINGS: Cardiovascular: The heart size is normal. No substantial pericardial effusion. No thoracic aortic aneurysm no large central pulmonary embolus Mediastinum/Nodes: No mediastinal lymphadenopathy. Calcified nodal tissue noted in the right hilum. There is no hilar lymphadenopathy. The esophagus has normal imaging features. There is no axillary lymphadenopathy. Lungs/Pleura: Patchy areas of central ground-glass attenuation are seen in the upper lobes, right greater than left, decreased in the interval. There is dependent bilateral lower lobe collapse/consolidation with small to moderate bilateral pleural effusions. Upper Abdomen: Heterogeneous liver parenchyma is nonspecific but may be related to fatty deposition or cirrhosis. Calcified granulomata noted in the spleen. Musculoskeletal: Widespread sclerotic bone metastases again noted. IMPRESSION: 1. Patchy areas of central ground-glass attenuation in the upper lobes, right greater than left, decreased in the interval. Imaging features likely reflect improving infectious/inflammatory etiology. 2. Small to moderate bilateral pleural  effusions with dependent bilateral lower lobe collapse/consolidation. 3. Widespread sclerotic bone metastases. 4. Heterogeneous liver parenchyma, nonspecific but may be related to fatty deposition or cirrhosis. Metastatic disease considered less likely but not excluded. Electronically Signed   By: Misty Stanley M.D.   On: 07/13/2020 09:23   CT ANGIO CHEST PE W OR WO CONTRAST  Result Date: 06/25/2020 CLINICAL DATA:  Worsening shortness of breath EXAM: CT ANGIOGRAPHY CHEST WITH CONTRAST TECHNIQUE: Multidetector CT imaging of the chest was performed using the standard protocol during bolus administration of intravenous contrast. Multiplanar CT image reconstructions and MIPs were obtained to evaluate the vascular anatomy. CONTRAST:  116mL OMNIPAQUE IOHEXOL 350 MG/ML SOLN COMPARISON:  Radiograph same day FINDINGS: Cardiovascular: There is a optimal opacification of the pulmonary arteries. There is no central,segmental, or subsegmental filling defects within the pulmonary arteries. There is mild cardiomegaly present. No pericardial effusion or thickening. No evidence right heart strain.  There is normal three-vessel brachiocephalic anatomy without proximal stenosis. The thoracic aorta is normal in appearance. Mediastinum/Nodes: No hilar, mediastinal, or axillary adenopathy. Thyroid gland, trachea, and esophagus demonstrate no significant findings. Lungs/Pleura: There is moderate to large bilateral pleural effusion with adjacent compressive atelectasis seen. Patchy rounded ground-glass opacities are seen predominantly within the right upper lung and right middle lobe. Upper Abdomen: No acute abnormalities present in the visualized portions of the upper abdomen. There is diffuse anasarca present. Musculoskeletal: Extensive osseous sclerotic blastic lesions are seen throughout the visualized portion of the axial and appendicular skeleton. Review of the MIP images confirms the above findings. IMPRESSION: No central,  segmental, or subsegmental pulmonary embolism Moderate to large bilateral pleural effusions with adjacent compressive atelectasis. Ground-glass opacities within the right upper lung and right middle lobe, likely due to infectious or inflammatory etiology. Extensive diffuse osseous metastatic disease. Electronically Signed   By: Prudencio Pair M.D.   On: 06/25/2020 03:11   MR BRAIN W WO CONTRAST  Result Date: 06/25/2020 CLINICAL DATA:  Left-sided weakness, metastatic prostate cancer EXAM: MRI HEAD WITHOUT AND WITH CONTRAST TECHNIQUE: Multiplanar, multiecho pulse sequences of the brain and surrounding structures were obtained without and with intravenous contrast. CONTRAST:  37mL GADAVIST GADOBUTROL 1 MMOL/ML IV SOLN COMPARISON:  2020 FINDINGS: Brain: There is thickening and enhancement along the posterior falx measuring 5 mm in thickness. The adjacent superior sagittal sinus is uninvolved. There is new minimal adjacent T2 FLAIR hyperintensity in the right parietal lobe that may reflect associated edema. There is no acute infarction or intracranial hemorrhage. There is no parenchymal mass or mass effect. There is no hydrocephalus or extra-axial fluid collection. Prominence of the ventricles and sulci reflects minor generalized parenchymal volume loss. Patchy foci of T2 hyperintensity in the supratentorial white matter are nonspecific but probably reflect mild chronic microvascular ischemic changes. These findings have progressed since 2020. Vascular: Major vessel flow voids at the skull base are preserved. Skull and upper cervical spine: Abnormal marrow signal likely reflecting diffuse metastatic disease. Sinuses/Orbits: Chronic right maxillary sinusitis. Orbits are unremarkable. Other: Sella is unremarkable.  Bilateral mastoid effusions. IMPRESSION: No acute infarction. Thickening enhancement along the posterior falx cerebri with possible minimal associated parenchymal edema. Differential considerations include  dural-based metastasis (more likely given normal appearance on prior study) and meningioma. Diffuse osseous metastatic disease. Electronically Signed   By: Macy Mis M.D.   On: 06/25/2020 08:24   MR CERVICAL SPINE W WO CONTRAST  Addendum Date: 07/05/2020   ADDENDUM REPORT: 07/05/2020 13:24 ADDENDUM: I was subsequently contacted by Dr. Curly Shores of neurology who directed my attention to subtle dural thickening and enhancement at the C5-C6 level. The dura measures 1-2 mm in thickness at this level. This finding may be postoperative in etiology. Trace dural-based tumor is difficult to definitively exclude. Consider contrast-enhanced MRI follow-up for surveillance. No resultant significant spinal cord mass effect at this level. This addendum was discussed with Dr. Curly Shores by telephone at approximately 1:30 p.m. on 07/05/2020. Electronically Signed   By: Kellie Simmering DO   On: 07/05/2020 13:24   Result Date: 07/05/2020 CLINICAL DATA:  Myelopathy, acute or progressive; left arm weakness. Additional history provided: History of prostate cancer metastatic to bone, status post cervical laminectomy with tumor removal in June 2020. EXAM: MRI CERVICAL SPINE WITHOUT AND WITH CONTRAST TECHNIQUE: Multiplanar and multiecho pulse sequences of the cervical spine, to include the craniocervical junction and cervicothoracic junction, were obtained without and with intravenous contrast. CONTRAST:  69mL GADAVIST GADOBUTROL 1  MMOL/ML IV SOLN COMPARISON:  Radiographs of the cervical spine 02/20/2019. Cervical spine MRI 02/19/2019. Cervical spine MRI 02/20/2019. FINDINGS: Alignment: Reversal of the expected cervical lordosis. No significant spondylolisthesis. Vertebrae: Vertebral body height is maintained. Diffuse metastatic disease throughout the cervical spine. Since the prior MRI of 02/20/2019, there has been interval C4, C5 and C6 laminectomies for resection of epidural metastatic tumor and microdiscectomy. Multilevel degenerative  endplate irregularity. Cord: No spinal cord signal abnormality. Posterior Fossa, vertebral arteries, paraspinal tissues: No abnormality identified within included portions of the posterior fossa. Bilateral mastoid effusions. Flow voids preserved within the imaged cervical vertebral arteries. Paraspinal soft tissues within normal limits. Unchanged nonspecific 13 mm round T2 hyperintense subcutaneous lesion within the posterior right upper back (series 8, image 36). Disc levels: Unless otherwise stated, the level by level findings below have not significantly changed since prior MRIs of 02/20/2019 and 02/19/2019. Severe disc degeneration at C3-C4, C4-C5, C5-C6, C6-C7 and C7-T1. C2-C3: No significant disc herniation or stenosis. C3-C4: Disc bulge. Uncovertebral and facet hypertrophy. The disc bulge effaces the ventral thecal sac, contacting and minimally flattening the ventral spinal cord with overall mild relative spinal canal narrowing. Moderate/severe left neural foraminal narrowing. C4-C5: Interval laminectomies for resection of previous dorsal epidural tumor. Posterior disc osteophyte complex. Uncinate and facet hypertrophy. No significant spinal canal stenosis. Bilateral neural foraminal narrowing (mild right, moderate left). C5-C6: Interval laminectomies for resection of previous dorsal epidural tumor. Posterior disc osteophyte complex. Uncinate and facet hypertrophy. The disc osteophyte complex minimally effaces the ventral thecal sac without significant spinal canal stenosis. Mild/moderate bilateral neural foraminal narrowing. C6-C7: Posterior disc osteophyte complex. Uncinate and facet hypertrophy. Mild spinal canal stenosis. Bilateral neural foraminal narrowing (mild right, moderate left). C7-T1: This level is imaged sagittally. No significant spinal canal stenosis. Mild bilateral neural foraminal narrowing. IMPRESSION: Since the prior MRI of 02/20/2019, there has been interval C4, C5 and C6 laminectomies  for resection of previous dorsal epidural metastatic tumor. No evidence of recurrent epidural tumor at this site. No convincing evidence of epidural metastatic disease within the cervical spine. 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 Cervical spondylosis as outlined and having not significantly changed from the cervical spine MRI of 02/20/2020. Severe disc degeneration throughout the majority of the cervical spine. No more than mild spinal canal stenosis at any level. At C3-C4, a disc bulge contributes to mild relative spinal canal narrowing, contacting and minimally flattening the ventral spinal cord. Multilevel neural foraminal narrowing greatest on the left at C3-C4 (moderate/severe), on the left at C4-C5 (moderate) and on the left at C6-C7 (moderate). 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 EXAM: PORTABLE CHEST 1 VIEW COMPARISON:  06/28/2020 FINDINGS: The heart size and mediastinal contours are within normal limits. No significant recurrence of left pleural fluid since thoracentesis with probable small pleural effusion remaining. No pneumothorax. Stable bibasilar atelectasis. Stable diffuse sclerotic metastatic disease involving the thoracic spine. IMPRESSION: No significant recurrence of left pleural fluid since thoracentesis with probable small pleural effusion remaining. Stable bibasilar atelectasis. Electronically Signed   By: Aletta Edouard M.D.   On: 07/01/2020 08:03   DG CHEST PORT 1 VIEW  Result Date: 06/28/2020 CLINICAL DATA:  Shortness of breath. EXAM: PORTABLE CHEST 1 VIEW COMPARISON:  06/27/2020. FINDINGS: Mediastinum and heart size normal. Progression of diffuse left lung interstitial infiltrates/edema. Diffuse  right lung interstitial infiltrates/edema noted on  today's exam. Persistent left-sided pleural effusion without interim change. No pneumothorax. Extensive sclerotic bony metastatic disease again noted. IMPRESSION: 1. Progression of diffuse left lung interstitial infiltrates/edema. Diffuse right lung interstitial infiltrates/edema noted on today's exam. Persistent left-sided pleural effusion without interim change. 2. Extensive sclerotic bony metastatic disease again noted. Electronically Signed   By: [place]   On: 06/28/2020 05:52   DG Chest Portable 1 View  Result Date: 06/24/2020 CLINICAL DATA:  Tachycardia. EXAM: PORTABLE CHEST 1 VIEW COMPARISON:  February 19, 2019 FINDINGS: There is cardiomegaly. There are moderate to large bilateral pleural effusions. There is bibasilar atelectasis. There is diffuse sclerosis throughout the visualized osseous structures most notably the thoracic spine. There is no pneumothorax. IMPRESSION: 1. Moderate to large bilateral pleural effusions with adjacent atelectasis. 2. Cardiomegaly. 3. Progressive sclerosis throughout the visualized osseous structures consistent with metastatic disease. Electronically Signed   By: Constance Holster M.D.   On: 06/24/2020 21:33   US LIVER DOPPLER  Result Date: 06/25/2020 CLINICAL DATA:  64 year old male with a history of liver disease EXAM: DUPLEX ULTRASOUND OF LIVER TECHNIQUE: Color and duplex Doppler ultrasound was performed to evaluate the hepatic in-flow and out-flow vessels. COMPARISON:  None. FINDINGS: Portal Vein Velocities Main:  43 cm/sec Right:  28 cm/sec Left:  18 cm/sec Hepatic Vein Velocities Right:  77 cm/sec Middle:  78 cm/sec Left:  92 cm/sec Hepatic Artery Velocity:  135 cm/sec Splenic Vein Velocity:  24 cm/sec Varices: Absent Ascites: Absent Spleen volume estimated 52 cubic cm Pleural fluid bilaterally IMPRESSION: Unremarkable duplex of the hepatic vasculature. Bilateral pleural effusions Electronically Signed   By: Corrie Mckusick D.O.   On: 06/25/2020 14:47    ECHOCARDIOGRAM COMPLETE BUBBLE STUDY  Result Date: 06/25/2020    ECHOCARDIOGRAM REPORT   Patient Name:   ERSKIN ZINDA Date of Exam: 06/25/2020 Medical Rec #:  [id number]    Height:       73.0 in Accession #:    [id number]   Weight:       150.0 lb Date of Birth:  1956/06/07    BSA:          1.904 m Patient Age:    34 years     BP:           111/75 mmHg Patient Gender: M            HR:           126 bpm. Exam Location:  Inpatient Procedure: 2D Echo, Cardiac Doppler, Color Doppler and Saline Contrast Bubble            Study Indications:    Atrial fibrillation with rapid ventricular response ([facility])  History:        Patient has no prior history of Echocardiogram examinations.  Sonographer:    Bernadene Person RDCS Referring Phys: [id number] [place]  1. Left ventricular ejection fraction, by estimation, is 50 to 55%. The left ventricle has low normal function. The left ventricle has no regional wall motion abnormalities. Left ventricular diastolic parameters were normal. There is the interventricular septum is flattened in diastole ('D' shaped left ventricle), consistent with right ventricular volume overload.  2. Right ventricular systolic function is mildly reduced. The right ventricular size is mildly enlarged. There is mildly elevated pulmonary artery systolic pressure. The estimated right ventricular systolic pressure is 92.4 mmHg.  3. Left atrial size was mildly dilated.  4. Right atrial size was mildly dilated.  5.  Large pleural effusion in the left lateral region.  6. The mitral valve is normal in structure. Mild mitral valve regurgitation.  7. Tricuspid valve regurgitation is mild to moderate.  8. The aortic valve is normal in structure. Aortic valve regurgitation is not visualized. Mild aortic valve sclerosis is present, with no evidence of aortic valve stenosis.  9. The inferior vena cava is dilated in size with <50% respiratory variability, suggesting right atrial pressure of 15 mmHg. 10.  Evidence of atrial level shunting detected by color flow Doppler. Agitated saline contrast bubble study was positive with shunting observed within 3-6 cardiac cycles suggestive of interatrial shunt. Comparison(s): No prior Echocardiogram. Findings are suggestive of right heart volume overload due to left-to-right shunt and there is a small right to left interatrial shunt by saline contrast study. However, an atrial septal defect could not be outlined with 2D and color Doppler imaging. Consider TEE or CT angiography to evaluate for anormalous pulmonary vein return or occult ASD. Qp:Qs shunt fraction could not be accurately calculated due to poor quality RV outflow imaging. FINDINGS  Left Ventricle: Left ventricular ejection fraction, by estimation, is 50 to 55%. The left ventricle has low normal function. The left ventricle has no regional wall motion abnormalities. The left ventricular internal cavity size was normal in size. There is no left ventricular hypertrophy. The interventricular septum is flattened in diastole ('D' shaped left ventricle), consistent with right ventricular volume overload. Left ventricular diastolic parameters were normal. Normal left ventricular filling pressure. Right Ventricle: The right ventricular size is mildly enlarged. No increase in right ventricular wall thickness. Right ventricular systolic function is mildly reduced. There is mildly elevated pulmonary artery systolic pressure. The tricuspid regurgitant  velocity is 2.60 m/s, and with an assumed right atrial pressure of 15 mmHg, the estimated right ventricular systolic pressure is 25.8 mmHg. Left Atrium: Left atrial size was mildly dilated. Right Atrium: Right atrial size was mildly dilated. Pericardium: There is no evidence of pericardial effusion. Mitral Valve: The mitral valve is normal in structure. Mild mitral valve regurgitation. Tricuspid Valve: The tricuspid valve is grossly normal. Tricuspid valve regurgitation is mild to  moderate. Aortic Valve: The aortic valve is normal in structure. Aortic valve regurgitation is not visualized. Mild aortic valve sclerosis is present, with no evidence of aortic valve stenosis. Pulmonic Valve: The pulmonic valve was not well visualized. Pulmonic valve regurgitation is not visualized. Aorta: The aortic root and ascending aorta are structurally normal, with no evidence of dilitation. Venous: The inferior vena cava is dilated in size with less than 50% respiratory variability, suggesting right atrial pressure of 15 mmHg. IAS/Shunts: There is redundancy of the interatrial septum. Evidence of atrial level shunting detected by color flow Doppler. Agitated saline contrast was given intravenously to evaluate for intracardiac shunting. Agitated saline contrast bubble study was  positive with shunting observed within 3-6 cardiac cycles suggestive of interatrial shunt. Additional Comments: There is a large pleural effusion in the left lateral region.  LEFT VENTRICLE PLAX 2D LVIDd:         4.10 cm     Diastology LVIDs:         3.00 cm     LV e' medial:    13.40 cm/s LV PW:         1.00 cm     LV E/e' medial:  6.5 LV IVS:        0.90 cm     LV e' lateral:   9.00 cm/s LVOT diam:  2.10 cm     LV E/e' lateral: 9.7 LV SV:         68 LV SV Index:   36 LVOT Area:     3.46 cm  LV Volumes (MOD) LV vol d, MOD A2C: 63.8 ml LV vol d, MOD A4C: 58.6 ml LV vol s, MOD A2C: 32.5 ml LV vol s, MOD A4C: 27.6 ml LV SV MOD A2C:     31.3 ml LV SV MOD A4C:     58.6 ml LV SV MOD BP:      32.7 ml RIGHT VENTRICLE RV S prime:     6.85 cm/s TAPSE (M-mode): 1.7 cm LEFT ATRIUM             Index       RIGHT ATRIUM           Index LA diam:        4.00 cm 2.10 cm/m  RA Area:     21.40 cm LA Vol (A2C):   37.5 ml 19.70 ml/m RA Volume:   61.60 ml  32.35 ml/m LA Vol (A4C):   53.1 ml 27.89 ml/m LA Biplane Vol: 44.8 ml 23.53 ml/m  AORTIC VALVE             PULMONIC VALVE LVOT Vmax:   89.40 cm/s  RVOT Peak grad: 2 mmHg LVOT Vmean:  65.500  cm/s LVOT VTI:    0.197 m  AORTA Ao Root diam: 2.70 cm Ao Asc diam:  2.90 cm MITRAL VALVE               TRICUSPID VALVE MV Area (PHT): 3.77 cm    TR Peak grad:   27.0 mmHg MV Decel Time: 201 msec    TR Vmax:        260.00 cm/s MV E velocity: 87.50 cm/s MV A velocity: 90.80 cm/s  SHUNTS MV E/A ratio:  0.96        Systemic VTI:  0.20 m                            Systemic Diam: 2.10 cm                            Pulmonic VTI:  0.087 m Dani Gobble Croitoru MD Electronically signed by Sanda Klein MD Signature Date/Time: 06/25/2020/12:54:37 PM    Final    VAS [name] LOWER EXTREMITY VENOUS (DVT)  Result Date: 07/14/2020  Lower Venous DVT Study Indications: Swelling.  Limitations: Poor ultrasound/tissue interface. Comparison Study: No prior studies. Performing Technologist: Oliver Hum RVT  Examination Guidelines: A complete evaluation includes B-mode imaging, spectral Doppler, color Doppler, and power Doppler as needed of all accessible portions of each vessel. Bilateral testing is considered an integral part of a complete examination. Limited examinations for reoccurring indications may be performed as noted. The reflux portion of the exam is performed with the patient in reverse Trendelenburg.  +---------+---------------+---------+-----------+----------+--------------+ RIGHT    CompressibilityPhasicitySpontaneityPropertiesThrombus Aging +---------+---------------+---------+-----------+----------+--------------+ CFV      Full           Yes      Yes                                 +---------+---------------+---------+-----------+----------+--------------+ SFJ      Full                                                        +---------+---------------+---------+-----------+----------+--------------+  FV Prox  Full                                                        +---------+---------------+---------+-----------+----------+--------------+ FV Mid   Full                                                         +---------+---------------+---------+-----------+----------+--------------+ FV DistalFull                                                        +---------+---------------+---------+-----------+----------+--------------+ PFV      Full                                                        +---------+---------------+---------+-----------+----------+--------------+ POP      Full           Yes      Yes                                 +---------+---------------+---------+-----------+----------+--------------+ PTV      Full                                                        +---------+---------------+---------+-----------+----------+--------------+ PERO     Full                                                        +---------+---------------+---------+-----------+----------+--------------+   +---------+---------------+---------+-----------+----------+--------------+ LEFT     CompressibilityPhasicitySpontaneityPropertiesThrombus Aging +---------+---------------+---------+-----------+----------+--------------+ CFV      Full           Yes      Yes                                 +---------+---------------+---------+-----------+----------+--------------+ SFJ      Full                                                        +---------+---------------+---------+-----------+----------+--------------+ FV Prox  Full                                                        +---------+---------------+---------+-----------+----------+--------------+  FV Mid   Full                                                        +---------+---------------+---------+-----------+----------+--------------+ FV DistalFull                                                        +---------+---------------+---------+-----------+----------+--------------+ PFV      Full                                                         +---------+---------------+---------+-----------+----------+--------------+ POP      Full           Yes      Yes                                 +---------+---------------+---------+-----------+----------+--------------+ PTV      Full                                                        +---------+---------------+---------+-----------+----------+--------------+ PERO     Full                                                        +---------+---------------+---------+-----------+----------+--------------+     Summary: RIGHT: - There is no evidence of deep vein thrombosis in the lower extremity.  - No cystic structure found in the popliteal fossa.  LEFT: - There is no evidence of deep vein thrombosis in the lower extremity.  - No cystic structure found in the popliteal fossa.  *See table(s) above for measurements and observations. Electronically signed by Ruta Hinds MD on 07/14/2020 at 10:12:25 AM.    Final    ECHOCARDIOGRAM LIMITED  Result Date: 07/12/2020    ECHOCARDIOGRAM LIMITED REPORT   Patient Name:   JUNAID WURZER Date of Exam: 07/12/2020 Medical Rec #:  [id number]    Height:       73.0 in Accession #:    [id number]   Weight:       159.0 lb Date of Birth:  April 01, 1956    BSA:          1.951 m Patient Age:    26 years     BP:           116/65 mmHg Patient Gender: M            HR:           87 bpm. Exam Location:  Inpatient Procedure: Limited Echo, Limited Color Doppler and Cardiac Doppler Indications:     atrial fibrillation  History:  Patient has no prior history of Echocardiogram examinations.  Sonographer:     Johny Chess Referring Phys:  [id number] Leanor Kail Diagnosing Phys: Loralie Champagne MD IMPRESSIONS  1. Left ventricular ejection fraction, by estimation, is 60 to 65%. The left ventricle has normal function. There is mild left ventricular hypertrophy. Left ventricular diastolic parameters are indeterminate.  2. Right ventricular systolic function is normal.  The right ventricular size is mildly enlarged. There is mildly elevated pulmonary artery systolic pressure. The estimated right ventricular systolic pressure is 97.9 mmHg. Mildly D-shaped interventricular septum suggests elevated RV pressure.  3. Left atrial size was moderately dilated.  4. Right atrial size was moderately dilated.  5. The mitral valve is normal in structure. Trivial mitral valve regurgitation. No evidence of mitral stenosis.  6. The aortic valve is tricuspid. Aortic valve regurgitation is not visualized. Mild aortic valve sclerosis is present, with no evidence of aortic valve stenosis.  7. The inferior vena cava is dilated in size with <50% respiratory variability, suggesting right atrial pressure of 15 mmHg.  8. Significant left pleural effusion. FINDINGS  Left Ventricle: Left ventricular ejection fraction, by estimation, is 60 to 65%. The left ventricle has normal function. The left ventricular internal cavity size was normal in size. There is mild left ventricular hypertrophy. Left ventricular diastolic  parameters are indeterminate. Right Ventricle: The right ventricular size is mildly enlarged. No increase in right ventricular wall thickness. Right ventricular systolic function is normal. There is mildly elevated pulmonary artery systolic pressure. The tricuspid regurgitant velocity is 2.55 m/s, and with an assumed right atrial pressure of 15 mmHg, the estimated right ventricular systolic pressure is 89.2 mmHg. Left Atrium: Left atrial size was moderately dilated. Right Atrium: Right atrial size was moderately dilated. Pericardium: Significant left pleural effusion. Trivial pericardial effusion is present. Mitral Valve: The mitral valve is normal in structure. Trivial mitral valve regurgitation. No evidence of mitral valve stenosis. Tricuspid Valve: The tricuspid valve is normal in structure. Tricuspid valve regurgitation is mild. Aortic Valve: The aortic valve is tricuspid. Aortic valve  regurgitation is not visualized. Mild aortic valve sclerosis is present, with no evidence of aortic valve stenosis. Venous: The inferior vena cava is dilated in size with less than 50% respiratory variability, suggesting right atrial pressure of 15 mmHg. IAS/Shunts: No atrial level shunt detected by color flow Doppler. LEFT VENTRICLE PLAX 2D LVIDd:         4.50 cm LVIDs:         3.40 cm LV PW:         1.10 cm LV IVS:        0.70 cm  IVC IVC diam: 2.30 cm LEFT ATRIUM         Index LA diam:    4.30 cm 2.20 cm/m  TRICUSPID VALVE TR Peak grad:   26.0 mmHg TR Vmax:        255.00 cm/s Loralie Champagne MD Electronically signed by Loralie Champagne MD Signature Date/Time: 07/12/2020/4:21:33 PM    Final (Updated)    IR THORACENTESIS ASP PLEURAL SPACE W/IMG GUIDE  Result Date: 06/28/2020 INDICATION: Patient with a history of prostate cancer with extensive metastases now has new onset pleural effusions. Interventional radiology asked to perform a therapeutic and diagnostic thoracentesis. EXAM: ULTRASOUND GUIDED THORACENTESIS MEDICATIONS: 1% lidocaine 10 mL COMPLICATIONS: None immediate. PROCEDURE: An ultrasound guided thoracentesis was thoroughly discussed with the patient and questions answered. The benefits, risks, alternatives and complications were also discussed. The patient understands and wishes to proceed  with the procedure. Written consent was obtained. Ultrasound was performed to localize and mark an adequate pocket of fluid in the left chest. The area was then prepped and draped in the normal sterile fashion. 1% Lidocaine was used for local anesthesia. Under ultrasound guidance a 6 Fr Safe-T-Centesis catheter was introduced. Thoracentesis was performed. The catheter was removed and a dressing applied. FINDINGS: A total of approximately 1.3 L of bright yellow fluid was removed. Samples were sent to the laboratory as requested by the clinical team. IMPRESSION: Successful ultrasound guided left thoracentesis yielding  1.3 L of pleural fluid. Read by: Soyla Dryer, NP Electronically Signed   By: Corrie Mckusick D.O.   On: 06/28/2020 12:11   IR THORACENTESIS ASP PLEURAL SPACE W/IMG GUIDE  Result Date: 06/27/2020 INDICATION: Patient with a history of prostate cancer with extensive metastases now has new onset pleural effusions. Interventional radiology asked to perform a therapeutic and diagnostic thoracentesis. EXAM: ULTRASOUND GUIDED THORACENTESIS MEDICATIONS: 1% lidocaine 20 mL COMPLICATIONS: None immediate. PROCEDURE: An ultrasound guided thoracentesis was thoroughly discussed with the patient and questions answered. The benefits, risks, alternatives and complications were also discussed. The patient understands and wishes to proceed with the procedure. Written consent was obtained. Ultrasound was performed to localize and mark an adequate pocket of fluid in the right chest. The area was then prepped and draped in the normal sterile fashion. 1% Lidocaine was used for local anesthesia. Under ultrasound guidance a 6 Fr Safe-T-Centesis catheter was introduced. Thoracentesis was performed. The catheter was removed and a dressing applied. FINDINGS: A total of approximately 1.6 L of dark yellow fluid was removed. Samples were sent to the laboratory as requested by the clinical team. IMPRESSION: Successful ultrasound guided right thoracentesis yielding 1.6 L of pleural fluid. Read by: Soyla Dryer, NP Electronically Signed   By: Markus Daft M.D.   On: 06/27/2020 12:29     Time Spent in minutes  [place] DO on 07/17/2020 at 7:36 AM

## 2020-07-17 NOTE — Progress Notes (Addendum)
Progress Note  Patient Name: Chris Morales Date of Encounter: 07/17/2020  [facility] Cardiologist: Evalina Field, MD   Subjective   Afib is rate controlled. Able to ambulate in the hallway without significant sob. Volume status is improving. Overnight -3.2L.   Inpatient Medications    Scheduled Meds: . apixaban  5 mg Oral BID  . bicalutamide  50 mg Oral Daily  . digoxin  0.125 mg Oral Daily  . diltiazem  120 mg Oral Daily  . folic acid  1 mg Oral Daily  . furosemide  60 mg Intravenous BID  . levothyroxine  25 mcg Oral QAC breakfast  . metoprolol succinate  100 mg Oral BID   Continuous Infusions:  PRN Meds: acetaminophen, ondansetron **OR** ondansetron (ZOFRAN) IV, polyethylene glycol   Vital Signs    Vitals:   07/17/20 0520 07/17/20 0722 07/17/20 0851 07/17/20 0856  BP:  102/63 91/63   Pulse:  (!) 58 68 78  Resp:  20    Temp:  98 F (36.7 C) 98.4 F (36.9 C)   TempSrc:  Oral Oral   SpO2:  100% 100%   Weight: 64.9 kg     Height:        Intake/Output Summary (Last 24 hours) at 07/17/2020 0908 Last data filed at 07/17/2020 0521 Gross per 24 hour  Intake 960 ml  Output 4550 ml  Net -3590 ml   Last 3 Weights 07/17/2020 07/16/2020 07/15/2020  Weight (lbs) 143 lb 1.3 oz 146 lb 6.4 oz 147 lb 8 oz  Weight (kg) 64.9 kg 66.407 kg 66.906 kg      Telemetry    Afib/flutter, HR 50-60s - Personally Reviewed  ECG    pending - Personally Reviewed  Physical Exam   GEN: No acute distress.   Neck: + JVD Cardiac: Irreg Irreg, no murmurs, rubs, or gallops.  Respiratory: Clear to auscultation bilaterally. GI: Soft, nontender, non-distended  MS: mild edema; No deformity. Neuro:  Nonfocal  Psych: Normal affect   Labs    High Sensitivity Troponin:   Recent Labs  Lab 06/25/20 1546 07/12/20 1730 07/12/20 1900  TROPONINIHS 15 17 19*      Chemistry Recent Labs  Lab 07/12/20 1512 07/12/20 1512 07/13/20 0447 07/14/20 0757 07/15/20 0554  07/16/20 0404 07/17/20 0542  NA 139   < > 137   < > 137 140 138  K 4.3   < > 4.0   < > 3.8 4.4 4.0  CL 106   < > 102   < > 101 103 104  CO2 21*   < > 29   < > 26 27 27   GLUCOSE 103*   < > 94   < > 92 97 94  BUN 19   < > 22   < > 22 27* 27*  CREATININE 0.87   < > 0.79   < > 0.73 0.76 0.74  CALCIUM 8.1*   < > 8.4*   < > 8.3* 8.4* 8.7*  PROT 5.8*  --  5.9*  --   --   --   --   ALBUMIN 2.9*  --  2.8*  --   --   --   --   AST 17  --  16  --   --   --   --   ALT 17  --  17  --   --   --   --   ALKPHOS 2,412*  --  2,382*  --   --   --   --  BILITOT 0.9  --  1.1  --   --   --   --   GFRNONAA >60   < > >60   < > >60 >60 >60  ANIONGAP 12   < > 6   < > 10 10 7    < > = values in this interval not displayed.     Hematology Recent Labs  Lab 07/15/20 0554 07/16/20 0404 07/17/20 0542  WBC 4.2 4.1 4.4  RBC 2.92* 3.06* 3.28*  HGB 8.5* 8.9* 9.5*  HCT 28.2* 29.8* 32.0*  MCV 96.6 97.4 97.6  MCH 29.1 29.1 29.0  MCHC 30.1 29.9* 29.7*  RDW 22.7* 22.8* 22.9*  PLT 277 266 256    BNP Recent Labs  Lab 07/12/20 1512  BNP 271.0*     DDimer No results for input(s): DDIMER in the last 168 hours.   Radiology    No results found.  Cardiac Studies   TTE 07/12/20: IMPRESSIONS  1. Left ventricular ejection fraction, by estimation, is 60 to 65%. The  left ventricle has normal function. There is mild left ventricular  hypertrophy. Left ventricular diastolic parameters are indeterminate.  2. Right ventricular systolic function is normal. The right ventricular  size is mildly enlarged. There is mildly elevated pulmonary artery  systolic pressure. The estimated right ventricular systolic pressure is  67.6 mmHg. Mildly D-shaped  interventricular septum suggests elevated RV pressure.  3. Left atrial size was moderately dilated.  4. Right atrial size was moderately dilated.  5. The mitral valve is normal in structure. Trivial mitral valve  regurgitation. No evidence of mitral stenosis.   6. The aortic valve is tricuspid. Aortic valve regurgitation is not  visualized. Mild aortic valve sclerosis is present, with no evidence of  aortic valve stenosis.  7. The inferior vena cava is dilated in size with <50% respiratory  variability, suggesting right atrial pressure of 15 mmHg.  8. Significant left pleural effusion.  07/13/20 LE Doppler: Summary:  RIGHT:  - There is no evidence of deep vein thrombosis in the lower extremity.    - No cystic structure found in the popliteal fossa.    LEFT:  - There is no evidence of deep vein thrombosis in the lower extremity.    - No cystic structure found in the popliteal fossa.    *See table(s) above for measurements and observations.  CTA chest 06/25/20: IMPRESSION: No central, segmental, or subsegmental pulmonary embolism  Moderate to large bilateral pleural effusions with adjacent compressive atelectasis.  Ground-glass opacities within the right upper lung and right middle lobe, likely due to infectious or inflammatory etiology.  Extensive diffuse osseous metastatic disease.  Patient Profile     64 y.o. male with PMHof metastatic prostate cancer, permanentatrial fibrillation admitted with acute on chronic diastolic congestive heart failure.  Assessment & Plan    Acute on chronic diastolic heart failure - IV lasix 60mg  BID - net -14.3L - weight 166>143lbs - Feels breathing is improving. Volume status is improving. Suspect he will not need much more IV diuresis - continue metoprolol and digoxin - continue daily weights, strict I/Os, and monitoring creatinine with diuresis  Permanent Afib - rate controlled with metoprolol and diltiazem - continue Apixaban  Pleural effusions - previously exudative - has metastatic prostate cancer however effusions negative for malignancy - CT chest 11/13 showed small to mod bilateral effusions.  - continue IV diuresis  Pulmonary HTN/RV failure - unclear  etiology - No PE on CTA chest - No hx of lung  disease or OSA - considering r/u chronic PE  For questions or updates, please contact [place] Please consult [email or web address] for contact info under        Signed, Cadence Ninfa Meeker, PA-C  07/17/2020, 9:08 AM    I have seen and examined the patient and agree with Cadence Jorene Minors as detailed above.  Patient is doing well. Ambulating the hallway without SOB. Has persistent L>R LE swelling. Wt down to 143lbs. He is hoping to get out of the hospital soon.  Exam: GEN: No acute distress.   Neck: Mildly elevated JVD Cardiac: Irregularly irregular, no murmurs, rubs, or gallops.  Respiratory: Clear to auscultation bilaterally. GI: Soft, nontender, non-distended  MS: L>R LE trace-1+ edema. Neuro:  Nonfocal  Psych: Normal affect   Plan: -Continue IV diuresis today -May transition to PO diuretic tomorrow pending volume status -Continue apixaban, metop, dig and dilt -Will likely wait on V/Q scan as patient not hypoxic and breathing improving. Would likely not change management at this time as on [facility] and doing well; can consider on non-emergent basis  Total time of encounter: 35 minutes total time of encounter, including 20 minutes spent in face-to-face patient care on the date of this encounter. This time includes coordination of care and counseling regarding above mentioned problem list. Remainder of non-face-to-face time involved reviewing chart documents/testing relevant to the patient encounter and documentation in the medical record. I have independently reviewed documentation from referring provider.   Gwyndolyn Kaufman, MD [place], MD

## 2020-07-18 DIAGNOSIS — E8779 Other fluid overload: Secondary | ICD-10-CM

## 2020-07-18 LAB — BASIC METABOLIC PANEL
Anion gap: 9 (ref 5–15)
BUN: 28 mg/dL — ABNORMAL HIGH (ref 8–23)
CO2: 29 mmol/L (ref 22–32)
Calcium: 8.9 mg/dL (ref 8.9–10.3)
Chloride: 101 mmol/L (ref 98–111)
Creatinine, Ser: 0.93 mg/dL (ref 0.61–1.24)
GFR, Estimated: >60 mL/min (ref >60)
Glucose, Bld: 79 mg/dL (ref 70–99)
Potassium: 4.1 mmol/L (ref 3.5–5.1)
Sodium: 139 mmol/L (ref 135–145)

## 2020-07-18 MED ORDER — FUROSEMIDE 40 MG PO TABS
40.0000 mg | ORAL_TABLET | Freq: Two times a day (BID) | ORAL | Status: DC
  Administered 2020-07-18 – 2020-07-19 (×2): 40 mg via ORAL
  Filled 2020-07-18 (×2): qty 1

## 2020-07-18 NOTE — Progress Notes (Signed)
Physical Therapy Discharge Patient Details Name: Chris Morales MRN: [id number] DOB: [birth year] Today's Date: 07/18/2020 Time: 0786-7544 PT Time Calculation (min) (ACUTE ONLY): 9 min  Patient discharged from PT services secondary to goals met and no further PT needs identified.  Please see latest therapy progress note for current level of functioning and progress toward goals.    Progress and discharge plan discussed with patient and/or caregiver: Patient/Caregiver agrees with plan  Lyanne Co, DPT Acute Rehabilitation Services [phone]  GP     Kendrick Ranch 07/18/2020, 1:21 PM

## 2020-07-18 NOTE — Progress Notes (Signed)
Physical Therapy Treatment Patient Details Name: Chris Morales MRN: [id number] DOB: 1955/09/14 Today's Date: 07/18/2020    History of Present Illness 64 year old male with presented to [facility] with complaints of tachycardia, lower extremity edema and left-sided weakness. Pt adm with afib with rvr, acute hypoxic respiratory failure likely secondary to malignant bil pleural effusions. Pt had recently been hospitalized in [place] with afib with rvr and resp failure requiring vent support and then left AMA before flying back to [facility] and presenting immediately to ED. 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 MRI - thickening and enhancement along the posterior falx. New hyperintensity in R parietal lobe that may reflect associated edema.  PMH including prostate CA with bony mets, C3-4 laminectomy with tumor removal 01/2019, and afib.    PT Comments    Pt fully participated in session. Pt I with ambulation and mod I on stairs. Pt given HEP and states he feel comfortable performing. Pt will benefit from OPPT to progress gait and balance. Pt is safe to d/c from PT stand point and not additional acute PT needs noted    Follow Up Recommendations  Outpatient PT     Equipment Recommendations  None recommended by PT    Recommendations for Other Services       Precautions / Restrictions Precautions Precautions: Fall Precaution Comments: Edema in BLE, RTC injury R shoulder Restrictions Weight Bearing Restrictions: No    Mobility  Bed Mobility                  Transfers Overall transfer level: Independent Equipment used: None Transfers: Sit to/from Stand Sit to Stand: Independent            Ambulation/Gait Ambulation/Gait assistance: Independent Gait Distance (Feet): 400 Feet Assistive device: None       General Gait Details: pt performed ambulation at decreased velocity but able to maintani balance with lateral and vertical head turns, pt able to perform 180  degreee turn no LOB, pt able to step over object and pick up object off the floor wtihout assist   Stairs Stairs: Yes Stairs assistance: Modified independent (Device/Increase time) Stair Management: One rail Right Number of Stairs: 5     Wheelchair Mobility    Modified Rankin (Stroke Patients Only)       Balance Overall balance assessment: Modified Independent                                          Cognition                                              Exercises      General Comments        Pertinent Vitals/Pain      Home Living                      Prior Function            PT Goals (current goals can now be found in the care plan section) Acute Rehab PT Goals Patient Stated Goal: to go home PT Goal Formulation: With patient Time For Goal Achievement: 07/27/20 Potential to Achieve Goals: Good Progress towards PT goals: Goals met/education completed, patient discharged from PT    Frequency  PT Plan Other (comment) (Pt I will d/c from PT)    Co-evaluation              AM-PAC PT "6 Clicks" Mobility   Outcome Measure  Help needed turning from your back to your side while in a flat bed without using bedrails?: None Help needed moving from lying on your back to sitting on the side of a flat bed without using bedrails?: None Help needed moving to and from a bed to a chair (including a wheelchair)?: None Help needed standing up from a chair using your arms (e.g., wheelchair or bedside chair)?: None Help needed to walk in hospital room?: None Help needed climbing 3-5 steps with a railing? : None 6 Click Score: 24    End of Session Equipment Utilized During Treatment: Gait belt Activity Tolerance: Patient tolerated treatment well Patient left: in chair;with call bell/phone within reach Nurse Communication: Mobility status       Time: 6734-1937 PT Time Calculation (min) (ACUTE  ONLY): 9 min  Charges:  $Gait Training: 8-22 mins                     Lyanne Co, DPT Acute Rehabilitation Services [phone]   [place] 07/18/2020, 11:03 AM

## 2020-07-18 NOTE — Progress Notes (Signed)
[facility]  PROGRESS NOTE  Christerpher Clos RPR:[id number] DOB: [birth year] DOA: 07/12/2020 PCP: Patient, No Pcp Per Admit date - 07/12/2020   Admitting Physician Charlann Lange, MD  Outpatient Primary MD for the patient is Patient, No Pcp Per  LOS - 6 Brief Narrative  Mr. Yarbrough is a 64 year old male with medical history significant for metastatic prostate cancer, persistent A. fib, chronic diastolic CHF with recent hospitalization on 44/62-86/3 for diastolic CHF exacerbation exudative bilateral effusions requiring thoracentesis (10/28, 10/29) and metastatic prostate cancer, with bony metastatic disease and concern for dural based metastasis who has been receiving inpatient rehabilitation embolus, inpatient rehab since 11/5.  Patient was seen by cardiology on 11/12 for evaluation at 8 PM and was noted to be significantly volume overloaded with high concern for acute exacerbation of diastolic heart failure with need for IV diuresis.  Patient was readmitted to [facility] on 11/12 by [facility] service.  Patient continues to have moderate symptoms but generally appears to be only minimally volume overloaded - in stable rate controlled aflutter.  Patient also has moderate pulmonary hypertension with signs of right ventricular failure but unclear etiology.  CTA chest from earlier hospitalization was negative for PE.  Venous duplex negative for DVTs   Subjective  No acute issues or events overnight, continues to have minimal orthopnea but resolving dyspnea with exertion.  Otherwise denies chest pain, headache, fevers, chills, nausea, vomiting, diarrhea, constipation.  A & P   Acute on chronic CHF with preserved ejection fraction exacerbation.  -Cardiology following, appreciate insight and recommendations  -Transition to p.o. lasix -I's and O's remain net negative, diuresing quite well, clinically patient appears to be somewhat euvolemic but continues to have symptoms of orthopnea.   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 - No PE on last CTA. No hx of OSA. No prior lung comorbidities.  Venous duplex with no DVTs -no current indication for V/Q given patient without hypoxia  Hypotension, transient in the setting of decreased cardiac output. SBP stable in 90s-100.  -tolerating IV lasix and toprol (will place hold parameters), closely monitor  Chronic atrial fib/flutter, currently rate controlled.  Had intermittent RVR to 115, but now rate controlled 70s to 80s -Continue diltiazem, digoxin, Toprol, Eliquis -Monitor on telemetry  Bilateral pleural effusions, stable.   - Previously exudative based on thoracentesis on 10/28, 10/29.  Cytology at that time was negative for malignancy.   -Repeat CT chest on 11/13. showed small to moderate-sized bilateral pleural effusions with dependent bilateral lower lobe collapse/consolidation.  -Hold off on further imaging unless clinically patient changes  Prostate cancer with widespread bony metastases. - History of C4/C5/C6 laminectomies for resection previous dorsal epidural metastatic tumor MRI of the brain more concerning for meningioma per neurology status post Lupron injection last hospitalization -Tolerating Casodex per oncology -Repeat scan in 2 months to be reviewed by CNS tumor board per discussion with oncology -Follow-up with Dr. [name] on 07/17/2020, will need to be rescheduled  Hypothyroidism, stable -Continue Synthroid 25 mcg  Family Communication  :  none Code Status : FULL  Disposition Plan  :  Patient is from home. Anticipated d/c date: 24-48h. Barriers to d/c or necessity for inpatient status: Resolution of persistent orthopnea and clinical response to diuretics/medical management. Consults  :  Cardiology  Procedures  :  none  DVT Prophylaxis  :  Eliquis  MDM: The below labs and imaging reports were reviewed and summarized above.  Medication management as above.  Lab  Results  Component Value Date   PLT 256 07/17/2020    Diet :  Diet Order            Diet Heart Room service appropriate? Yes; Fluid consistency: Thin  Diet effective now                  Inpatient Medications Scheduled Meds: . apixaban  5 mg Oral BID  . bicalutamide  50 mg Oral Daily  . digoxin  0.125 mg Oral Daily  . diltiazem  120 mg Oral Daily  . folic acid  1 mg Oral Daily  . furosemide  60 mg Intravenous BID  . levothyroxine  25 mcg Oral QAC breakfast  . metoprolol succinate  100 mg Oral BID   Antibiotics  :   Anti-infectives (From admission, onward)   None       Objective   Vitals:   07/17/20 1851 07/17/20 1952 07/18/20 0003 07/18/20 0425  BP: 98/63 106/69  (!) 100/53  Pulse: 72 72  (!) 58  Resp: 16 17  16   Temp: 98.4 F (36.9 C) 97.8 F (36.6 C)  98 F (36.7 C)  TempSrc: Oral Oral  Oral  SpO2: 99% 99%  100%  Weight:   64 kg   Height:        SpO2: 100 %  Wt Readings from Last 3 Encounters:  07/18/20 64 kg  07/06/20 72.1 kg  07/05/20 70.6 kg     Intake/Output Summary (Last 24 hours) at 07/18/2020 0736 Last data filed at 07/18/2020 2951 Gross per 24 hour  Intake 600 ml  Output 3075 ml  Net -2475 ml   Physical Exam: General:  Pleasantly resting in bed, No acute distress. HEENT:  Normocephalic atraumatic.  Sclerae nonicteric, noninjected.  Extraocular movements intact bilaterally. Neck:  Without mass or deformity.  Trachea is midline. Lungs:  Clear to auscultate bilaterally without rhonchi, wheeze, or rales. Heart:  Regular rate and rhythm.  Without murmurs, rubs, or gallops. Abdomen:  Soft, nontender, nondistended.  Without guarding or rebound. Extremities: Without cyanosis, clubbing, edema, or obvious deformity. Vascular:  Dorsalis pedis and posterior tibial pulses palpable bilaterally. Skin:  Warm and dry, no erythema, no ulcerations.  Data Reviewed:  CBC Recent Labs  Lab 07/13/20 0447 07/14/20 0757 07/15/20 0554 07/16/20 0404  07/17/20 0542  WBC 5.7 4.3 4.2 4.1 4.4  HGB 9.0* 9.4* 8.5* 8.9* 9.5*  HCT 30.3* 31.7* 28.2* 29.8* 32.0*  PLT 293 275 277 266 256  MCV 96.2 96.6 96.6 97.4 97.6  MCH 28.6 28.7 29.1 29.1 29.0  MCHC 29.7* 29.7* 30.1 29.9* 29.7*  RDW 23.1* 23.0* 22.7* 22.8* 22.9*  LYMPHSABS  --   --  1.1 1.1 1.2  MONOABS  --   --  0.6 0.6 0.6  EOSABS  --   --  0.1 0.1 0.1  BASOSABS  --   --  0.1 0.1 0.1    Chemistries  Recent Labs  Lab 07/12/20 1512 07/12/20 1512 07/13/20 0447 07/13/20 0447 07/14/20 0757 07/15/20 0554 07/16/20 0404 07/17/20 0542 07/18/20 0333  NA 139   < > 137   < > 136 137 140 138 139  K 4.3   < > 4.0   < > 4.1 3.8 4.4 4.0 4.1  CL 106   < > 102   < > 101 101 103 104 101  CO2 21*   < > 29   < > 27 26 27 27 29   GLUCOSE 103*   < >  94   < > 81 92 97 94 79  BUN 19   < > 22   < > 18 22 27* 27* 28*  CREATININE 0.87   < > 0.79   < > 0.69 0.73 0.76 0.74 0.93  CALCIUM 8.1*   < > 8.4*   < > 8.5* 8.3* 8.4* 8.7* 8.9  AST 17  --  16  --   --   --   --   --   --   ALT 17  --  17  --   --   --   --   --   --   ALKPHOS 2,412*  --  2,382*  --   --   --   --   --   --   BILITOT 0.9  --  1.1  --   --   --   --   --   --    < > = values in this interval not displayed.   ------------------------------------------------------------------------------------------------------------------ No results for input(s): CHOL, HDL, LDLCALC, TRIG, CHOLHDL, LDLDIRECT in the last 72 hours.  Lab Results  Component Value Date   HGBA1C 5.8 (H) 06/25/2020   ------------------------------------------------------------------------------------------------------------------ No results for input(s): TSH, T4TOTAL, T3FREE, THYROIDAB in the last 72 hours.  Invalid input(s): FREET3 ------------------------------------------------------------------------------------------------------------------ No results for input(s): VITAMINB12, FOLATE, FERRITIN, TIBC, IRON, RETICCTPCT in the last 72 hours.  Coagulation profile  Recent Labs  Lab 07/13/20 0447  INR 1.8*    No results for input(s): DDIMER in the last 72 hours.  Cardiac Enzymes No results for input(s): CKMB, TROPONINI, MYOGLOBIN in the last 168 hours.  Invalid input(s): CK ------------------------------------------------------------------------------------------------------------------    Component Value Date/Time   BNP 271.0 (H) 07/12/2020 1512    Micro Results No results found for this or any previous visit (from the past 240 hour(s)).  Radiology Reports DG Chest 1 View  Result Date: 07/12/2020 CLINICAL DATA:  Shortness of breath EXAM: CHEST  1 VIEW COMPARISON:  July 01, 2020.  History of prostate carcinoma FINDINGS: There is airspace consolidation in the left base region. Lungs elsewhere clear. Heart is upper normal in size with pulmonary vascularity normal. No adenopathy. Sclerotic bony metastases noted throughout the thoracic region. IMPRESSION: Airspace opacity left base consistent with pneumonia. Lungs elsewhere clear. Heart upper normal in size. Widespread bony metastases noted throughout the thoracic region. Electronically Signed   By: Lowella Grip III M.D.   On: 07/12/2020 14:24   DG Chest 1 View  Result Date: 06/28/2020 CLINICAL DATA:  Left thoracentesis. EXAM: CHEST  1 VIEW COMPARISON:  06/28/2020. FINDINGS: Mediastinum and hilar structures normal. Stable cardiomegaly. Bilateral interstitial prominence again noted with interim improvement from prior exam. Findings suggests improving CHF. Improved left pleural effusion. Tiny right pleural effusion. No pneumothorax post thoracentesis. IMPRESSION: Findings consistent with improving CHF. Improved left pleural effusion. No pneumothorax post thoracentesis. Tiny right pleural effusion. Electronically Signed   By: Marcello Moores  Register   On: 06/28/2020 10:55   DG Chest 1 View  Result Date: 06/27/2020 CLINICAL DATA:  Post right thoracentesis. EXAM: CHEST  1 VIEW COMPARISON:  06/24/2020  FINDINGS: Interval improvement in right pleural effusion. No pneumothorax. Improved aeration right lung base Interval progression of left pleural effusion and left lower lobe atelectasis Extensive sclerotic bony changes compatible with metastatic prostate cancer. IMPRESSION: Improved right pleural effusion with no pneumothorax post thoracentesis Progression of left effusion and left lower lobe atelectasis. Bony metastatic disease. Electronically Signed   By: Franchot Gallo M.D.  On: 06/27/2020 12:06   DG Chest 2 View  Result Date: 07/12/2020 CLINICAL DATA:  Shortness of breath.  Metastatic prostate cancer. EXAM: CHEST - 2 VIEW COMPARISON:  07/12/2020 FINDINGS: Diffuse bony sclerosis compatible with diffuse osseous metastatic disease. Moderate bilateral pleural effusions with associated passive atelectasis. Airway thickening is present, suggesting bronchitis or reactive airways disease. Heart size within normal limits. IMPRESSION: 1. Moderate bilateral pleural effusions with associated passive atelectasis. 2. Airway thickening is present, suggesting bronchitis or reactive airways disease. 3. Diffuse sclerotic osseous metastatic disease. Electronically Signed   By: Van Clines M.D.   On: 07/12/2020 18:24   DG Shoulder Right  Result Date: 07/08/2020 CLINICAL DATA:  Shoulder weakness, bone metastasis, torn rotator cuff EXAM: RIGHT SHOULDER - 2+ VIEW COMPARISON:  Radiograph 02/20/2014 FINDINGS: A diffusely mottled sclerotic appearance of the bones is compatible with known osseous metastatic disease. There is a high-riding appearance of the humeral head which is suggestive of underlying rotator cuff insufficiency particularly in the setting of known rotator cuff tear. Moderate arthrosis of the glenohumeral and acromioclavicular joints. No acute traumatic osseous injury is present at this time. IMPRESSION: 1. Diffusely mottled sclerotic appearance of the bones compatible with known osseous metastatic  disease. 2. High-riding appearance of the humeral head is suggestive of underlying rotator cuff insufficiency, particularly in the setting of known rotator cuff tear. 3. No acute fracture or traumatic malalignment. Electronically Signed   By: Lovena Le M.D.   On: 07/08/2020 19:23   CT CHEST W CONTRAST  Result Date: 07/13/2020 CLINICAL DATA:  Pleural effusion on chest x-ray. EXAM: CT CHEST WITH CONTRAST TECHNIQUE: Multidetector CT imaging of the chest was performed during intravenous contrast administration. CONTRAST:  14mL OMNIPAQUE IOHEXOL 300 MG/ML  SOLN COMPARISON:  06/25/2020 FINDINGS: Cardiovascular: The heart size is normal. No substantial pericardial effusion. No thoracic aortic aneurysm no large central pulmonary embolus Mediastinum/Nodes: No mediastinal lymphadenopathy. Calcified nodal tissue noted in the right hilum. There is no hilar lymphadenopathy. The esophagus has normal imaging features. There is no axillary lymphadenopathy. Lungs/Pleura: Patchy areas of central ground-glass attenuation are seen in the upper lobes, right greater than left, decreased in the interval. There is dependent bilateral lower lobe collapse/consolidation with small to moderate bilateral pleural effusions. Upper Abdomen: Heterogeneous liver parenchyma is nonspecific but may be related to fatty deposition or cirrhosis. Calcified granulomata noted in the spleen. Musculoskeletal: Widespread sclerotic bone metastases again noted. IMPRESSION: 1. Patchy areas of central ground-glass attenuation in the upper lobes, right greater than left, decreased in the interval. Imaging features likely reflect improving infectious/inflammatory etiology. 2. Small to moderate bilateral pleural effusions with dependent bilateral lower lobe collapse/consolidation. 3. Widespread sclerotic bone metastases. 4. Heterogeneous liver parenchyma, nonspecific but may be related to fatty deposition or cirrhosis. Metastatic disease considered less likely  but not excluded. Electronically Signed   By: Misty Stanley M.D.   On: 07/13/2020 09:23   CT ANGIO CHEST PE W OR WO CONTRAST  Result Date: 06/25/2020 CLINICAL DATA:  Worsening shortness of breath EXAM: CT ANGIOGRAPHY CHEST WITH CONTRAST TECHNIQUE: Multidetector CT imaging of the chest was performed using the standard protocol during bolus administration of intravenous contrast. Multiplanar CT image reconstructions and MIPs were obtained to evaluate the vascular anatomy. CONTRAST:  155mL OMNIPAQUE IOHEXOL 350 MG/ML SOLN COMPARISON:  Radiograph same day FINDINGS: Cardiovascular: There is a optimal opacification of the pulmonary arteries. There is no central,segmental, or subsegmental filling defects within the pulmonary arteries. There is mild cardiomegaly present. No pericardial effusion or  thickening. No evidence right heart strain. There is normal three-vessel brachiocephalic anatomy without proximal stenosis. The thoracic aorta is normal in appearance. Mediastinum/Nodes: No hilar, mediastinal, or axillary adenopathy. Thyroid gland, trachea, and esophagus demonstrate no significant findings. Lungs/Pleura: There is moderate to large bilateral pleural effusion with adjacent compressive atelectasis seen. Patchy rounded ground-glass opacities are seen predominantly within the right upper lung and right middle lobe. Upper Abdomen: No acute abnormalities present in the visualized portions of the upper abdomen. There is diffuse anasarca present. Musculoskeletal: Extensive osseous sclerotic blastic lesions are seen throughout the visualized portion of the axial and appendicular skeleton. Review of the MIP images confirms the above findings. IMPRESSION: No central, segmental, or subsegmental pulmonary embolism Moderate to large bilateral pleural effusions with adjacent compressive atelectasis. Ground-glass opacities within the right upper lung and right middle lobe, likely due to infectious or inflammatory etiology.  Extensive diffuse osseous metastatic disease. Electronically Signed   By: Prudencio Pair M.D.   On: 06/25/2020 03:11   MR BRAIN W WO CONTRAST  Result Date: 06/25/2020 CLINICAL DATA:  Left-sided weakness, metastatic prostate cancer EXAM: MRI HEAD WITHOUT AND WITH CONTRAST TECHNIQUE: Multiplanar, multiecho pulse sequences of the brain and surrounding structures were obtained without and with intravenous contrast. CONTRAST:  26mL GADAVIST GADOBUTROL 1 MMOL/ML IV SOLN COMPARISON:  2020 FINDINGS: Brain: There is thickening and enhancement along the posterior falx measuring 5 mm in thickness. The adjacent superior sagittal sinus is uninvolved. There is new minimal adjacent T2 FLAIR hyperintensity in the right parietal lobe that may reflect associated edema. There is no acute infarction or intracranial hemorrhage. There is no parenchymal mass or mass effect. There is no hydrocephalus or extra-axial fluid collection. Prominence of the ventricles and sulci reflects minor generalized parenchymal volume loss. Patchy foci of T2 hyperintensity in the supratentorial white matter are nonspecific but probably reflect mild chronic microvascular ischemic changes. These findings have progressed since 2020. Vascular: Major vessel flow voids at the skull base are preserved. Skull and upper cervical spine: Abnormal marrow signal likely reflecting diffuse metastatic disease. Sinuses/Orbits: Chronic right maxillary sinusitis. Orbits are unremarkable. Other: Sella is unremarkable.  Bilateral mastoid effusions. IMPRESSION: No acute infarction. Thickening enhancement along the posterior falx cerebri with possible minimal associated parenchymal edema. Differential considerations include dural-based metastasis (more likely given normal appearance on prior study) and meningioma. Diffuse osseous metastatic disease. Electronically Signed   By: Macy Mis M.D.   On: 06/25/2020 08:24   MR CERVICAL SPINE W WO CONTRAST  Addendum Date:  07/05/2020   ADDENDUM REPORT: 07/05/2020 13:24 ADDENDUM: I was subsequently contacted by Dr. Curly Shores of neurology who directed my attention to subtle dural thickening and enhancement at the C5-C6 level. The dura measures 1-2 mm in thickness at this level. This finding may be postoperative in etiology. Trace dural-based tumor is difficult to definitively exclude. Consider contrast-enhanced MRI follow-up for surveillance. No resultant significant spinal cord mass effect at this level. This addendum was discussed with Dr. Curly Shores by telephone at approximately 1:30 p.m. on 07/05/2020. Electronically Signed   By: Kellie Simmering DO   On: 07/05/2020 13:24   Result Date: 07/05/2020 CLINICAL DATA:  Myelopathy, acute or progressive; left arm weakness. Additional history provided: History of prostate cancer metastatic to bone, status post cervical laminectomy with tumor removal in June 2020. EXAM: MRI CERVICAL SPINE WITHOUT AND WITH CONTRAST TECHNIQUE: Multiplanar and multiecho pulse sequences of the cervical spine, to include the craniocervical junction and cervicothoracic junction, were obtained without and with intravenous contrast.  CONTRAST:  49mL GADAVIST GADOBUTROL 1 MMOL/ML IV SOLN COMPARISON:  Radiographs of the cervical spine 02/20/2019. Cervical spine MRI 02/19/2019. Cervical spine MRI 02/20/2019. FINDINGS: Alignment: Reversal of the expected cervical lordosis. No significant spondylolisthesis. Vertebrae: Vertebral body height is maintained. Diffuse metastatic disease throughout the cervical spine. Since the prior MRI of 02/20/2019, there has been interval C4, C5 and C6 laminectomies for resection of epidural metastatic tumor and microdiscectomy. Multilevel degenerative endplate irregularity. Cord: No spinal cord signal abnormality. Posterior Fossa, vertebral arteries, paraspinal tissues: No abnormality identified within included portions of the posterior fossa. Bilateral mastoid effusions. Flow voids preserved within  the imaged cervical vertebral arteries. Paraspinal soft tissues within normal limits. Unchanged nonspecific 13 mm round T2 hyperintense subcutaneous lesion within the posterior right upper back (series 8, image 36). Disc levels: Unless otherwise stated, the level by level findings below have not significantly changed since prior MRIs of 02/20/2019 and 02/19/2019. Severe disc degeneration at C3-C4, C4-C5, C5-C6, C6-C7 and C7-T1. C2-C3: No significant disc herniation or stenosis. C3-C4: Disc bulge. Uncovertebral and facet hypertrophy. The disc bulge effaces the ventral thecal sac, contacting and minimally flattening the ventral spinal cord with overall mild relative spinal canal narrowing. Moderate/severe left neural foraminal narrowing. C4-C5: Interval laminectomies for resection of previous dorsal epidural tumor. Posterior disc osteophyte complex. Uncinate and facet hypertrophy. No significant spinal canal stenosis. Bilateral neural foraminal narrowing (mild right, moderate left). C5-C6: Interval laminectomies for resection of previous dorsal epidural tumor. Posterior disc osteophyte complex. Uncinate and facet hypertrophy. The disc osteophyte complex minimally effaces the ventral thecal sac without significant spinal canal stenosis. Mild/moderate bilateral neural foraminal narrowing. C6-C7: Posterior disc osteophyte complex. Uncinate and facet hypertrophy. Mild spinal canal stenosis. Bilateral neural foraminal narrowing (mild right, moderate left). C7-T1: This level is imaged sagittally. No significant spinal canal stenosis. Mild bilateral neural foraminal narrowing. IMPRESSION: Since the prior MRI of 02/20/2019, there has been interval C4, C5 and C6 laminectomies for resection of previous dorsal epidural metastatic tumor. No evidence of recurrent epidural tumor at this site. No convincing evidence of epidural metastatic disease within the cervical spine. Redemonstrated diffuse osseous metastatic disease throughout  the cervical spine. Cervical spondylosis as outlined and having not significantly changed from the cervical spine MRI of 02/20/2020. Severe disc degeneration throughout the majority of the cervical spine. No more than mild spinal canal stenosis at any level. At C3-C4, a disc bulge contributes to mild relative spinal canal narrowing, contacting and minimally flattening the ventral spinal cord. Multilevel neural foraminal narrowing greatest on the left at C3-C4 (moderate/severe), on the left at C4-C5 (moderate) and on the left at C6-C7 (moderate). 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 EXAM: PORTABLE CHEST 1 VIEW COMPARISON:  06/28/2020 FINDINGS: The heart size and mediastinal contours are within normal limits. No significant recurrence of left pleural fluid since thoracentesis with probable small pleural effusion remaining. No pneumothorax. Stable bibasilar atelectasis. Stable diffuse sclerotic metastatic disease involving the thoracic spine. IMPRESSION: No significant recurrence of left pleural fluid since thoracentesis with probable small pleural effusion remaining. Stable bibasilar atelectasis. Electronically Signed   By: Aletta Edouard M.D.   On: 07/01/2020 08:03   DG CHEST PORT 1 VIEW  Result Date: 06/28/2020 CLINICAL DATA:  Shortness of breath. EXAM: PORTABLE CHEST 1 VIEW COMPARISON:  06/27/2020. FINDINGS: Mediastinum and heart size normal. Progression of  diffuse left lung interstitial infiltrates/edema. Diffuse right lung interstitial infiltrates/edema noted on today's exam. Persistent left-sided pleural effusion without interim change. No pneumothorax. Extensive sclerotic bony metastatic disease again noted. IMPRESSION: 1. Progression of diffuse left lung interstitial infiltrates/edema. Diffuse right lung  interstitial infiltrates/edema noted on today's exam. Persistent left-sided pleural effusion without interim change. 2. Extensive sclerotic bony metastatic disease again noted. Electronically Signed   By: [place]   On: 06/28/2020 05:52   DG Chest Portable 1 View  Result Date: 06/24/2020 CLINICAL DATA:  Tachycardia. EXAM: PORTABLE CHEST 1 VIEW COMPARISON:  February 19, 2019 FINDINGS: There is cardiomegaly. There are moderate to large bilateral pleural effusions. There is bibasilar atelectasis. There is diffuse sclerosis throughout the visualized osseous structures most notably the thoracic spine. There is no pneumothorax. IMPRESSION: 1. Moderate to large bilateral pleural effusions with adjacent atelectasis. 2. Cardiomegaly. 3. Progressive sclerosis throughout the visualized osseous structures consistent with metastatic disease. Electronically Signed   By: Constance Holster M.D.   On: 06/24/2020 21:33   US LIVER DOPPLER  Result Date: 06/25/2020 CLINICAL DATA:  64 year old male with a history of liver disease EXAM: DUPLEX ULTRASOUND OF LIVER TECHNIQUE: Color and duplex Doppler ultrasound was performed to evaluate the hepatic in-flow and out-flow vessels. COMPARISON:  None. FINDINGS: Portal Vein Velocities Main:  43 cm/sec Right:  28 cm/sec Left:  18 cm/sec Hepatic Vein Velocities Right:  77 cm/sec Middle:  78 cm/sec Left:  92 cm/sec Hepatic Artery Velocity:  135 cm/sec Splenic Vein Velocity:  24 cm/sec Varices: Absent Ascites: Absent Spleen volume estimated 52 cubic cm Pleural fluid bilaterally IMPRESSION: Unremarkable duplex of the hepatic vasculature. Bilateral pleural effusions Electronically Signed   By: Corrie Mckusick D.O.   On: 06/25/2020 14:47   ECHOCARDIOGRAM COMPLETE BUBBLE STUDY  Result Date: 06/25/2020    ECHOCARDIOGRAM REPORT   Patient Name:   VAMSI APFEL Date of Exam: 06/25/2020 Medical Rec #:  [id number]    Height:       73.0 in Accession #:    [id number]   Weight:       150.0 lb Date  of Birth:  08/02/1956    BSA:          1.904 m Patient Age:    89 years     BP:           111/75 mmHg Patient Gender: M            HR:           126 bpm. Exam Location:  Inpatient Procedure: 2D Echo, Cardiac Doppler, Color Doppler and Saline Contrast Bubble            Study Indications:    Atrial fibrillation with rapid ventricular response ([place])  History:        Patient has no prior history of Echocardiogram examinations.  Sonographer:    Bernadene Person RDCS Referring Phys: [id number] [place]  1. Left ventricular ejection fraction, by estimation, is 50 to 55%. The left ventricle has low normal function. The left ventricle has no regional wall motion abnormalities. Left ventricular diastolic parameters were normal. There is the interventricular septum is flattened in diastole ('D' shaped left ventricle), consistent with right ventricular volume overload.  2. Right ventricular systolic function is mildly reduced. The right ventricular size is mildly enlarged. There is mildly elevated pulmonary artery systolic pressure. The estimated right ventricular systolic pressure is 56.3 mmHg.  3. Left atrial size was mildly dilated.  4. Right atrial  size was mildly dilated.  5. Large pleural effusion in the left lateral region.  6. The mitral valve is normal in structure. Mild mitral valve regurgitation.  7. Tricuspid valve regurgitation is mild to moderate.  8. The aortic valve is normal in structure. Aortic valve regurgitation is not visualized. Mild aortic valve sclerosis is present, with no evidence of aortic valve stenosis.  9. The inferior vena cava is dilated in size with <50% respiratory variability, suggesting right atrial pressure of 15 mmHg. 10. Evidence of atrial level shunting detected by color flow Doppler. Agitated saline contrast bubble study was positive with shunting observed within 3-6 cardiac cycles suggestive of interatrial shunt. Comparison(s): No prior Echocardiogram. Findings are  suggestive of right heart volume overload due to left-to-right shunt and there is a small right to left interatrial shunt by saline contrast study. However, an atrial septal defect could not be outlined with 2D and color Doppler imaging. Consider TEE or CT angiography to evaluate for anormalous pulmonary vein return or occult ASD. Qp:Qs shunt fraction could not be accurately calculated due to poor quality RV outflow imaging. FINDINGS  Left Ventricle: Left ventricular ejection fraction, by estimation, is 50 to 55%. The left ventricle has low normal function. The left ventricle has no regional wall motion abnormalities. The left ventricular internal cavity size was normal in size. There is no left ventricular hypertrophy. The interventricular septum is flattened in diastole ('D' shaped left ventricle), consistent with right ventricular volume overload. Left ventricular diastolic parameters were normal. Normal left ventricular filling pressure. Right Ventricle: The right ventricular size is mildly enlarged. No increase in right ventricular wall thickness. Right ventricular systolic function is mildly reduced. There is mildly elevated pulmonary artery systolic pressure. The tricuspid regurgitant  velocity is 2.60 m/s, and with an assumed right atrial pressure of 15 mmHg, the estimated right ventricular systolic pressure is 35.3 mmHg. Left Atrium: Left atrial size was mildly dilated. Right Atrium: Right atrial size was mildly dilated. Pericardium: There is no evidence of pericardial effusion. Mitral Valve: The mitral valve is normal in structure. Mild mitral valve regurgitation. Tricuspid Valve: The tricuspid valve is grossly normal. Tricuspid valve regurgitation is mild to moderate. Aortic Valve: The aortic valve is normal in structure. Aortic valve regurgitation is not visualized. Mild aortic valve sclerosis is present, with no evidence of aortic valve stenosis. Pulmonic Valve: The pulmonic valve was not well  visualized. Pulmonic valve regurgitation is not visualized. Aorta: The aortic root and ascending aorta are structurally normal, with no evidence of dilitation. Venous: The inferior vena cava is dilated in size with less than 50% respiratory variability, suggesting right atrial pressure of 15 mmHg. IAS/Shunts: There is redundancy of the interatrial septum. Evidence of atrial level shunting detected by color flow Doppler. Agitated saline contrast was given intravenously to evaluate for intracardiac shunting. Agitated saline contrast bubble study was  positive with shunting observed within 3-6 cardiac cycles suggestive of interatrial shunt. Additional Comments: There is a large pleural effusion in the left lateral region.  LEFT VENTRICLE PLAX 2D LVIDd:         4.10 cm     Diastology LVIDs:         3.00 cm     LV e' medial:    13.40 cm/s LV PW:         1.00 cm     LV E/e' medial:  6.5 LV IVS:        0.90 cm     LV e' lateral:  9.00 cm/s LVOT diam:     2.10 cm     LV E/e' lateral: 9.7 LV SV:         68 LV SV Index:   36 LVOT Area:     3.46 cm  LV Volumes (MOD) LV vol d, MOD A2C: 63.8 ml LV vol d, MOD A4C: 58.6 ml LV vol s, MOD A2C: 32.5 ml LV vol s, MOD A4C: 27.6 ml LV SV MOD A2C:     31.3 ml LV SV MOD A4C:     58.6 ml LV SV MOD BP:      32.7 ml RIGHT VENTRICLE RV S prime:     6.85 cm/s TAPSE (M-mode): 1.7 cm LEFT ATRIUM             Index       RIGHT ATRIUM           Index LA diam:        4.00 cm 2.10 cm/m  RA Area:     21.40 cm LA Vol (A2C):   37.5 ml 19.70 ml/m RA Volume:   61.60 ml  32.35 ml/m LA Vol (A4C):   53.1 ml 27.89 ml/m LA Biplane Vol: 44.8 ml 23.53 ml/m  AORTIC VALVE             PULMONIC VALVE LVOT Vmax:   89.40 cm/s  RVOT Peak grad: 2 mmHg LVOT Vmean:  65.500 cm/s LVOT VTI:    0.197 m  AORTA Ao Root diam: 2.70 cm Ao Asc diam:  2.90 cm MITRAL VALVE               TRICUSPID VALVE MV Area (PHT): 3.77 cm    TR Peak grad:   27.0 mmHg MV Decel Time: 201 msec    TR Vmax:        260.00 cm/s MV E velocity:  87.50 cm/s MV A velocity: 90.80 cm/s  SHUNTS MV E/A ratio:  0.96        Systemic VTI:  0.20 m                            Systemic Diam: 2.10 cm                            Pulmonic VTI:  0.087 m Dani Gobble Croitoru MD Electronically signed by Sanda Klein MD Signature Date/Time: 06/25/2020/12:54:37 PM    Final    VAS [name] LOWER EXTREMITY VENOUS (DVT)  Result Date: 07/14/2020  Lower Venous DVT Study Indications: Swelling.  Limitations: Poor ultrasound/tissue interface. Comparison Study: No prior studies. Performing Technologist: Oliver Hum RVT  Examination Guidelines: A complete evaluation includes B-mode imaging, spectral Doppler, color Doppler, and power Doppler as needed of all accessible portions of each vessel. Bilateral testing is considered an integral part of a complete examination. Limited examinations for reoccurring indications may be performed as noted. The reflux portion of the exam is performed with the patient in reverse Trendelenburg.  +---------+---------------+---------+-----------+----------+--------------+ RIGHT    CompressibilityPhasicitySpontaneityPropertiesThrombus Aging +---------+---------------+---------+-----------+----------+--------------+ CFV      Full           Yes      Yes                                 +---------+---------------+---------+-----------+----------+--------------+ SFJ      Full                                                        +---------+---------------+---------+-----------+----------+--------------+  FV Prox  Full                                                        +---------+---------------+---------+-----------+----------+--------------+ FV Mid   Full                                                        +---------+---------------+---------+-----------+----------+--------------+ FV DistalFull                                                         +---------+---------------+---------+-----------+----------+--------------+ PFV      Full                                                        +---------+---------------+---------+-----------+----------+--------------+ POP      Full           Yes      Yes                                 +---------+---------------+---------+-----------+----------+--------------+ PTV      Full                                                        +---------+---------------+---------+-----------+----------+--------------+ PERO     Full                                                        +---------+---------------+---------+-----------+----------+--------------+   +---------+---------------+---------+-----------+----------+--------------+ LEFT     CompressibilityPhasicitySpontaneityPropertiesThrombus Aging +---------+---------------+---------+-----------+----------+--------------+ CFV      Full           Yes      Yes                                 +---------+---------------+---------+-----------+----------+--------------+ SFJ      Full                                                        +---------+---------------+---------+-----------+----------+--------------+ FV Prox  Full                                                        +---------+---------------+---------+-----------+----------+--------------+  FV Mid   Full                                                        +---------+---------------+---------+-----------+----------+--------------+ FV DistalFull                                                        +---------+---------------+---------+-----------+----------+--------------+ PFV      Full                                                        +---------+---------------+---------+-----------+----------+--------------+ POP      Full           Yes      Yes                                  +---------+---------------+---------+-----------+----------+--------------+ PTV      Full                                                        +---------+---------------+---------+-----------+----------+--------------+ PERO     Full                                                        +---------+---------------+---------+-----------+----------+--------------+     Summary: RIGHT: - There is no evidence of deep vein thrombosis in the lower extremity.  - No cystic structure found in the popliteal fossa.  LEFT: - There is no evidence of deep vein thrombosis in the lower extremity.  - No cystic structure found in the popliteal fossa.  *See table(s) above for measurements and observations. Electronically signed by Ruta Hinds MD on 07/14/2020 at 10:12:25 AM.    Final    ECHOCARDIOGRAM LIMITED  Result Date: 07/12/2020    ECHOCARDIOGRAM LIMITED REPORT   Patient Name:   SALAH NAKAMURA Date of Exam: 07/12/2020 Medical Rec #:  [id number]    Height:       73.0 in Accession #:    [id number]   Weight:       159.0 lb Date of Birth:  11-21-1955    BSA:          1.951 m Patient Age:    47 years     BP:           116/65 mmHg Patient Gender: M            HR:           87 bpm. Exam Location:  Inpatient Procedure: Limited Echo, Limited Color Doppler and Cardiac Doppler Indications:     atrial fibrillation  History:  Patient has no prior history of Echocardiogram examinations.  Sonographer:     Johny Chess Referring Phys:  [id number] Leanor Kail Diagnosing Phys: Loralie Champagne MD IMPRESSIONS  1. Left ventricular ejection fraction, by estimation, is 60 to 65%. The left ventricle has normal function. There is mild left ventricular hypertrophy. Left ventricular diastolic parameters are indeterminate.  2. Right ventricular systolic function is normal. The right ventricular size is mildly enlarged. There is mildly elevated pulmonary artery systolic pressure. The estimated right ventricular systolic  pressure is 15.4 mmHg. Mildly D-shaped interventricular septum suggests elevated RV pressure.  3. Left atrial size was moderately dilated.  4. Right atrial size was moderately dilated.  5. The mitral valve is normal in structure. Trivial mitral valve regurgitation. No evidence of mitral stenosis.  6. The aortic valve is tricuspid. Aortic valve regurgitation is not visualized. Mild aortic valve sclerosis is present, with no evidence of aortic valve stenosis.  7. The inferior vena cava is dilated in size with <50% respiratory variability, suggesting right atrial pressure of 15 mmHg.  8. Significant left pleural effusion. FINDINGS  Left Ventricle: Left ventricular ejection fraction, by estimation, is 60 to 65%. The left ventricle has normal function. The left ventricular internal cavity size was normal in size. There is mild left ventricular hypertrophy. Left ventricular diastolic  parameters are indeterminate. Right Ventricle: The right ventricular size is mildly enlarged. No increase in right ventricular wall thickness. Right ventricular systolic function is normal. There is mildly elevated pulmonary artery systolic pressure. The tricuspid regurgitant velocity is 2.55 m/s, and with an assumed right atrial pressure of 15 mmHg, the estimated right ventricular systolic pressure is 00.8 mmHg. Left Atrium: Left atrial size was moderately dilated. Right Atrium: Right atrial size was moderately dilated. Pericardium: Significant left pleural effusion. Trivial pericardial effusion is present. Mitral Valve: The mitral valve is normal in structure. Trivial mitral valve regurgitation. No evidence of mitral valve stenosis. Tricuspid Valve: The tricuspid valve is normal in structure. Tricuspid valve regurgitation is mild. Aortic Valve: The aortic valve is tricuspid. Aortic valve regurgitation is not visualized. Mild aortic valve sclerosis is present, with no evidence of aortic valve stenosis. Venous: The inferior vena cava is  dilated in size with less than 50% respiratory variability, suggesting right atrial pressure of 15 mmHg. IAS/Shunts: No atrial level shunt detected by color flow Doppler. LEFT VENTRICLE PLAX 2D LVIDd:         4.50 cm LVIDs:         3.40 cm LV PW:         1.10 cm LV IVS:        0.70 cm  IVC IVC diam: 2.30 cm LEFT ATRIUM         Index LA diam:    4.30 cm 2.20 cm/m  TRICUSPID VALVE TR Peak grad:   26.0 mmHg TR Vmax:        255.00 cm/s Loralie Champagne MD Electronically signed by Loralie Champagne MD Signature Date/Time: 07/12/2020/4:21:33 PM    Final (Updated)    IR THORACENTESIS ASP PLEURAL SPACE W/IMG GUIDE  Result Date: 06/28/2020 INDICATION: Patient with a history of prostate cancer with extensive metastases now has new onset pleural effusions. Interventional radiology asked to perform a therapeutic and diagnostic thoracentesis. EXAM: ULTRASOUND GUIDED THORACENTESIS MEDICATIONS: 1% lidocaine 10 mL COMPLICATIONS: None immediate. PROCEDURE: An ultrasound guided thoracentesis was thoroughly discussed with the patient and questions answered. The benefits, risks, alternatives and complications were also discussed. The patient understands and wishes to proceed  with the procedure. Written consent was obtained. Ultrasound was performed to localize and mark an adequate pocket of fluid in the left chest. The area was then prepped and draped in the normal sterile fashion. 1% Lidocaine was used for local anesthesia. Under ultrasound guidance a 6 Fr Safe-T-Centesis catheter was introduced. Thoracentesis was performed. The catheter was removed and a dressing applied. FINDINGS: A total of approximately 1.3 L of bright yellow fluid was removed. Samples were sent to the laboratory as requested by the clinical team. IMPRESSION: Successful ultrasound guided left thoracentesis yielding 1.3 L of pleural fluid. Read by: Soyla Dryer, NP Electronically Signed   By: Corrie Mckusick D.O.   On: 06/28/2020 12:11   IR THORACENTESIS ASP  PLEURAL SPACE W/IMG GUIDE  Result Date: 06/27/2020 INDICATION: Patient with a history of prostate cancer with extensive metastases now has new onset pleural effusions. Interventional radiology asked to perform a therapeutic and diagnostic thoracentesis. EXAM: ULTRASOUND GUIDED THORACENTESIS MEDICATIONS: 1% lidocaine 20 mL COMPLICATIONS: None immediate. PROCEDURE: An ultrasound guided thoracentesis was thoroughly discussed with the patient and questions answered. The benefits, risks, alternatives and complications were also discussed. The patient understands and wishes to proceed with the procedure. Written consent was obtained. Ultrasound was performed to localize and mark an adequate pocket of fluid in the right chest. The area was then prepped and draped in the normal sterile fashion. 1% Lidocaine was used for local anesthesia. Under ultrasound guidance a 6 Fr Safe-T-Centesis catheter was introduced. Thoracentesis was performed. The catheter was removed and a dressing applied. FINDINGS: A total of approximately 1.6 L of dark yellow fluid was removed. Samples were sent to the laboratory as requested by the clinical team. IMPRESSION: Successful ultrasound guided right thoracentesis yielding 1.6 L of pleural fluid. Read by: Soyla Dryer, NP Electronically Signed   By: Markus Daft M.D.   On: 06/27/2020 12:29     Time Spent in minutes  [place] DO on 07/18/2020 at 7:36 AM

## 2020-07-18 NOTE — Plan of Care (Signed)
  Problem: Education: Goal: Ability to demonstrate management of disease process will improve Outcome: Adequate for Discharge   Problem: Education: Goal: Ability to verbalize understanding of medication therapies will improve Outcome: Adequate for Discharge   

## 2020-07-18 NOTE — Progress Notes (Signed)
Progress Note  Patient Name: Chris Morales Date of Encounter: 07/18/2020  [facility] Cardiologist: Evalina Field, MD   Subjective   Patient is feeling well this morning. No chest pain. Walking the hallways without issue. Askign when he can go home.  Net negative 2.175 with 2.8L UOP Cr 0.93  Inpatient Medications    Scheduled Meds: . apixaban  5 mg Oral BID  . bicalutamide  50 mg Oral Daily  . digoxin  0.125 mg Oral Daily  . diltiazem  120 mg Oral Daily  . folic acid  1 mg Oral Daily  . furosemide  60 mg Intravenous BID  . levothyroxine  25 mcg Oral QAC breakfast  . metoprolol succinate  100 mg Oral BID   Continuous Infusions:  PRN Meds: acetaminophen, ondansetron **OR** ondansetron (ZOFRAN) IV, polyethylene glycol   Vital Signs    Vitals:   07/17/20 1851 07/17/20 1952 07/18/20 0003 07/18/20 0425  BP: 98/63 106/69  (!) 100/53  Pulse: 72 72  (!) 58  Resp: 16 17  16   Temp: 98.4 F (36.9 C) 97.8 F (36.6 C)  98 F (36.7 C)  TempSrc: Oral Oral  Oral  SpO2: 99% 99%  100%  Weight:   64 kg   Height:        Intake/Output Summary (Last 24 hours) at 07/18/2020 0933 Last data filed at 07/18/2020 5035 Gross per 24 hour  Intake 720 ml  Output 2800 ml  Net -2080 ml   Last 3 Weights 07/18/2020 07/17/2020 07/16/2020  Weight (lbs) 141 lb 143 lb 1.3 oz 146 lb 6.4 oz  Weight (kg) 63.957 kg 64.9 kg 66.407 kg      Telemetry    Aflutter; rate controlled - Personally Reviewed  ECG    Aflutter rate controlled - Personally Reviewed  Physical Exam   GEN: No acute distress.   Neck: No JVD Cardiac: Bradycardic, regular, no murmus Respiratory: Clear to auscultation bilaterally. GI: Soft, nontender, non-distended  MS: Warm, L>R trace to 1+ edema to mid shin Neuro:  Nonfocal  Psych: Normal affect   Labs    High Sensitivity Troponin:   Recent Labs  Lab 06/25/20 1546 07/12/20 1730 07/12/20 1900  TROPONINIHS 15 17 19*      Chemistry Recent Labs  Lab  07/12/20 1512 07/12/20 1512 07/13/20 0447 07/14/20 0757 07/16/20 0404 07/17/20 0542 07/18/20 0333  NA 139   < > 137   < > 140 138 139  K 4.3   < > 4.0   < > 4.4 4.0 4.1  CL 106   < > 102   < > 103 104 101  CO2 21*   < > 29   < > 27 27 29   GLUCOSE 103*   < > 94   < > 97 94 79  BUN 19   < > 22   < > 27* 27* 28*  CREATININE 0.87   < > 0.79   < > 0.76 0.74 0.93  CALCIUM 8.1*   < > 8.4*   < > 8.4* 8.7* 8.9  PROT 5.8*  --  5.9*  --   --   --   --   ALBUMIN 2.9*  --  2.8*  --   --   --   --   AST 17  --  16  --   --   --   --   ALT 17  --  17  --   --   --   --  ALKPHOS 2,412*  --  2,382*  --   --   --   --   BILITOT 0.9  --  1.1  --   --   --   --   GFRNONAA >60   < > >60   < > >60 >60 >60  ANIONGAP 12   < > 6   < > 10 7 9    < > = values in this interval not displayed.     Hematology Recent Labs  Lab 07/15/20 0554 07/16/20 0404 07/17/20 0542  WBC 4.2 4.1 4.4  RBC 2.92* 3.06* 3.28*  HGB 8.5* 8.9* 9.5*  HCT 28.2* 29.8* 32.0*  MCV 96.6 97.4 97.6  MCH 29.1 29.1 29.0  MCHC 30.1 29.9* 29.7*  RDW 22.7* 22.8* 22.9*  PLT 277 266 256    BNP Recent Labs  Lab 07/12/20 1512  BNP 271.0*     DDimer No results for input(s): DDIMER in the last 168 hours.   Radiology    No results found.  Cardiac Studies   TTE 07/12/20: IMPRESSIONS  1. Left ventricular ejection fraction, by estimation, is 60 to 65%. The  left ventricle has normal function. There is mild left ventricular  hypertrophy. Left ventricular diastolic parameters are indeterminate.  2. Right ventricular systolic function is normal. The right ventricular  size is mildly enlarged. There is mildly elevated pulmonary artery  systolic pressure. The estimated right ventricular systolic pressure is  32.9 mmHg. Mildly D-shaped  interventricular septum suggests elevated RV pressure.  3. Left atrial size was moderately dilated.  4. Right atrial size was moderately dilated.  5. The mitral valve is normal in  structure. Trivial mitral valve  regurgitation. No evidence of mitral stenosis.  6. The aortic valve is tricuspid. Aortic valve regurgitation is not  visualized. Mild aortic valve sclerosis is present, with no evidence of  aortic valve stenosis.  7. The inferior vena cava is dilated in size with <50% respiratory  variability, suggesting right atrial pressure of 15 mmHg.  8. Significant left pleural effusion.  07/13/20 LE Doppler: Summary:  RIGHT:  - There is no evidence of deep vein thrombosis in the lower extremity.    - No cystic structure found in the popliteal fossa.    LEFT:  - There is no evidence of deep vein thrombosis in the lower extremity.    - No cystic structure found in the popliteal fossa.    *See table(s) above for measurements and observations.  CTA chest 06/25/20: IMPRESSION: No central, segmental, or subsegmental pulmonary embolism  Moderate to large bilateral pleural effusions with adjacent compressive atelectasis.  Ground-glass opacities within the right upper lung and right middle lobe, likely due to infectious or inflammatory etiology.  Extensive diffuse osseous metastatic disease.   Patient Profile     64 y.o. male with PMH of metastatic prostate cancer, permanent atrial fibrillation admitted with acute on chronic diastolic congestive heart failure. Now improving.   Assessment & Plan    #Acute on chronic diastolic heart failure: Improved. Wt down to 141 lbs which was below prior admission weights.  -Transition to lasix 40mg  PO BID today and monitor UOP with goal net even -Continue metop and digoxin -Monitor I/Os and daily weights -Low Na diet -Will re-establish new dry weight--suspect ~141lbs as appears near euvolemia currently  #Permanent Afib/flutter Remains well rate controlled. -Continue metop and dilt -Continue digoxin -Continue apixaban for [facility]  #Pleural effusions Previously exudative. Has metastatic prostate  cancer but effusions negative for malignancy. Has  required thoracentesis in the past. CT chest 11/13 showed small to moderate bilateral effusions. Has ongoing dyspnea but remains overloaded in setting of acute on chronic HF. Will continue with ongoing diuresis and reassess once volume status improved  -Continue to monitor with diuresis; do not think needs thora at this time  #Pulmonary HTN/RV failure Unclear etiology. Previous CTA without evidence of embolus and LE dopplers without evidence of DVT. ? Chronic PE. No history of lung pathology. Currently on apixaban. -Continue diuresis as above -Continue apixaban  -CTA/LE doppler without evidence of clot -Will defere V/Q scan for now as likely would not change management and patient already on [facility]; can pursue as out-patient if needed  Other hospital issues: #Metastatic prostate cancer #Patent foramen ovale #Hypothyroidism      For questions or updates, please contact [facility] Please consult [email or web address] for contact info under        Signed, Freada Bergeron, MD  07/18/2020, 9:33 AM

## 2020-07-18 NOTE — Progress Notes (Signed)
Patient just finished waling the halls and is now in room eating, report given to current RN.

## 2020-07-19 ENCOUNTER — Other Ambulatory Visit (HOSPITAL_COMMUNITY): Payer: Self-pay | Admitting: Internal Medicine

## 2020-07-19 LAB — BASIC METABOLIC PANEL
Anion gap: 10 (ref 5–15)
BUN: 30 mg/dL — ABNORMAL HIGH (ref 8–23)
CO2: 28 mmol/L (ref 22–32)
Calcium: 8.8 mg/dL — ABNORMAL LOW (ref 8.9–10.3)
Chloride: 101 mmol/L (ref 98–111)
Creatinine, Ser: 0.86 mg/dL (ref 0.61–1.24)
GFR, Estimated: >60 mL/min (ref >60)
Glucose, Bld: 97 mg/dL (ref 70–99)
Potassium: 3.8 mmol/L (ref 3.5–5.1)
Sodium: 139 mmol/L (ref 135–145)

## 2020-07-19 MED ORDER — FUROSEMIDE 40 MG PO TABS
40.0000 mg | ORAL_TABLET | Freq: Two times a day (BID) | ORAL | 0 refills | Status: DC
Start: 2020-07-19 — End: 2020-08-01

## 2020-07-19 MED ORDER — METOPROLOL SUCCINATE ER 100 MG PO TB24
100.0000 mg | ORAL_TABLET | Freq: Every day | ORAL | 0 refills | Status: DC
Start: 2020-07-19 — End: 2020-08-22

## 2020-07-19 MED ORDER — APIXABAN 5 MG PO TABS
5.0000 mg | ORAL_TABLET | Freq: Two times a day (BID) | ORAL | 0 refills | Status: DC
Start: 2020-07-19 — End: 2020-08-22

## 2020-07-19 MED ORDER — LEVOTHYROXINE SODIUM 25 MCG PO TABS
25.0000 ug | ORAL_TABLET | Freq: Every day | ORAL | 0 refills | Status: DC
Start: 2020-07-19 — End: 2020-09-10

## 2020-07-19 MED FILL — FUROSEMIDE 40 MG TABLET: 40 | 15 days supply | Qty: 30 | Fill #0

## 2020-07-19 MED FILL — METOPROLOL SUCCINATE ER 100: 100 | 60 days supply | Qty: 60 | Fill #0

## 2020-07-19 MED FILL — LEVOTHYROXINE SODIUM 25 MCG: 25 | 30 days supply | Qty: 30 | Fill #0

## 2020-07-19 MED FILL — ELIQUIS 5 MG TABLET: 5 | 30 days supply | Qty: 60 | Fill #0

## 2020-07-19 NOTE — Progress Notes (Signed)
Dr. Johney Frame wanted to ensure patient had f/u - he had a pre-existing appointment on 08/01/20 with Dr. Audie Box which we will keep. Appt info placed on AVS. Reeve Mallo PA-C

## 2020-07-19 NOTE — Plan of Care (Signed)

## 2020-07-19 NOTE — Discharge Summary (Signed)
Physician Discharge Summary  Chris Morales ZOX:[id number] DOB: November 09, 1955 DOA: 07/12/2020  PCP: Patient, No Pcp Per  Admit date: 07/12/2020 Discharge date: 07/19/2020  Admitted From: Home Disposition: Home  Recommendations for Outpatient Follow-up:  1. Follow up with PCP in 1-2 weeks 2. Please obtain BMP/CBC in one week 3. Cardiology 12--2-21 4. Follow-up with outpatient PT as scheduled  Discharge Condition: Stable CODE STATUS: Full Diet recommendation: Low-salt low-fat diet  Brief/Interim Summary: Chris Morales is a 64 year old male with medical history significant for metastatic prostate cancer, persistent A. fib, chronic diastolic CHF with recent hospitalization on 81/19-14/7 for diastolic CHF exacerbation exudative bilateral effusions requiring thoracentesis (10/28, 10/29) and metastatic prostate cancer, with bony metastatic disease and concern for dural based metastasis who has been receiving inpatient rehabilitation embolus, inpatient rehab since 11/5.  Patient was seen by cardiology on 11/12 for evaluation at 8 PM and was noted to be significantly volume overloaded with high concern for acute exacerbation of diastolic heart failure with need for IV diuresis.  Patient was readmitted to [facility] on 11/12 by [facility] service.  Patient continues to have moderate symptoms but generally appears to be only minimally volume overloaded - in stable rate controlled aflutter.  Patient also has moderate pulmonary hypertension with signs of right ventricular failure but unclear etiology.  CTA chest from earlier hospitalization was negative for PE.  Venous duplex negative for DVTs.  Patient diuresed quite well, cardiology following, multiple medication changes as outlined below.  At this point patient appears euvolemic, labs remain stable within normal limits and otherwise patient agreeable for discharge home with close follow-up with cardiology and PCP as scheduled.  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)   Metastasis to bone [facility])   Atrial fibrillation (HCC)   Lower extremity edema   Shortness of breath   Pulmonary hypertension (HCC)   Chronic right-sided CHF (congestive heart failure) (HCC)   Hypoalbuminemia   Hypothyroidism   Acute on chronic diastolic CHF (congestive heart failure) [facility])    Discharge Instructions  Discharge Instructions    Call MD for:  difficulty breathing, headache or visual disturbances   Complete by: As directed    Call MD for:  difficulty breathing, headache or visual disturbances   Complete by: As directed    Call MD for:  persistant dizziness or light-headedness   Complete by: As directed    Diet - low sodium heart healthy   Complete by: As directed    Increase activity slowly   Complete by: As directed    Increase activity slowly   Complete by: As directed      Allergies as of 07/19/2020   No Known Allergies     Medication List    STOP taking these medications   acetaminophen 325 MG tablet Commonly known as: TYLENOL   diltiazem 30 MG tablet Commonly known as: CARDIZEM   pantoprazole 40 MG tablet Commonly known as: PROTONIX     TAKE these medications   apixaban 5 MG Tabs tablet Commonly known as: Eliquis Take 1 tablet (5 mg total) by mouth 2 (two) times daily.   bicalutamide 50 MG tablet Commonly known as: CASODEX Take 1 tablet (50 mg total) by mouth daily.   digoxin 0.125 MG tablet Commonly known as: LANOXIN Take 1 tablet (0.125 mg total) by mouth daily.   diltiazem 120 MG  24 hr capsule Commonly known as: CARDIZEM CD Take 1 capsule (120 mg total) by mouth daily.   folic acid 1 MG tablet Commonly known as: FOLVITE Take 1 tablet (1 mg total) by mouth daily.   furosemide 40 MG tablet Commonly known as: LASIX Take 1 tablet (40 mg total) by mouth 2 (two) times  daily.   levothyroxine 25 MCG tablet Commonly known as: SYNTHROID Take 1 tablet (25 mcg total) by mouth daily before breakfast.   metoprolol succinate 100 MG 24 hr tablet Commonly known as: TOPROL-XL Take 1 tablet (100 mg total) by mouth daily. Take with or immediately following a meal. What changed: when to take this       Follow-up Information    O'Neal, Cassie Freer, MD Follow up.   Specialties: Internal Medicine, Cardiology, Radiology Why: [facility] (near [facility]) - Thursday December 2nd, 2021 at 8:20 AM (Arrive by 8:05 AM).  Contact information: [place] [address] [phone]        [facility] Follow up.   Specialty: Rehabilitation Why: they will call you to schedule your apt times, if you do not hear from them in 3 days please give them a call.  Contact information: [address] [id number] [place] [phone]             No Known Allergies  Consultations:  Cardiology   Procedures/Studies: DG Chest 1 View  Result Date: 07/12/2020 CLINICAL DATA:  Shortness of breath EXAM: CHEST  1 VIEW COMPARISON:  July 01, 2020.  History of prostate carcinoma FINDINGS: There is airspace consolidation in the left base region. Lungs elsewhere clear. Heart is upper normal in size with pulmonary vascularity normal. No adenopathy. Sclerotic bony metastases noted throughout the thoracic region. IMPRESSION: Airspace opacity left base consistent with pneumonia. Lungs elsewhere clear. Heart upper normal in size. Widespread bony metastases noted throughout the thoracic region. Electronically Signed   By: Lowella Grip III M.D.   On: 07/12/2020 14:24   DG Chest 1 View  Result Date: 06/28/2020 CLINICAL DATA:  Left thoracentesis. EXAM: CHEST  1 VIEW COMPARISON:  06/28/2020. FINDINGS: Mediastinum and hilar structures normal. Stable cardiomegaly.  Bilateral interstitial prominence again noted with interim improvement from prior exam. Findings suggests improving CHF. Improved left pleural effusion. Tiny right pleural effusion. No pneumothorax post thoracentesis. IMPRESSION: Findings consistent with improving CHF. Improved left pleural effusion. No pneumothorax post thoracentesis. Tiny right pleural effusion. Electronically Signed   By: Marcello Moores  Register   On: 06/28/2020 10:55   DG Chest 1 View  Result Date: 06/27/2020 CLINICAL DATA:  Post right thoracentesis. EXAM: CHEST  1 VIEW COMPARISON:  06/24/2020 FINDINGS: Interval improvement in right pleural effusion. No pneumothorax. Improved aeration right lung base Interval progression of left pleural effusion and left lower lobe atelectasis Extensive sclerotic bony changes compatible with metastatic prostate cancer. IMPRESSION: Improved right pleural effusion with no pneumothorax post thoracentesis Progression of left effusion and left lower lobe atelectasis. Bony metastatic disease. Electronically Signed   By: Franchot Gallo M.D.   On: 06/27/2020 12:06   DG Chest 2 View  Result Date: 07/12/2020 CLINICAL DATA:  Shortness of breath.  Metastatic prostate cancer. EXAM: CHEST - 2 VIEW COMPARISON:  07/12/2020 FINDINGS: Diffuse bony sclerosis compatible with diffuse osseous metastatic disease. Moderate bilateral pleural effusions with associated passive atelectasis. Airway thickening is present, suggesting bronchitis or reactive airways disease. Heart size within normal limits. IMPRESSION: 1. Moderate bilateral pleural effusions  with associated passive atelectasis. 2. Airway thickening is present, suggesting bronchitis or reactive airways disease. 3. Diffuse sclerotic osseous metastatic disease. Electronically Signed   By: Van Clines M.D.   On: 07/12/2020 18:24   DG Shoulder Right  Result Date: 07/08/2020 CLINICAL DATA:  Shoulder weakness, bone metastasis, torn rotator cuff EXAM: RIGHT SHOULDER - 2+  VIEW COMPARISON:  Radiograph 02/20/2014 FINDINGS: A diffusely mottled sclerotic appearance of the bones is compatible with known osseous metastatic disease. There is a high-riding appearance of the humeral head which is suggestive of underlying rotator cuff insufficiency particularly in the setting of known rotator cuff tear. Moderate arthrosis of the glenohumeral and acromioclavicular joints. No acute traumatic osseous injury is present at this time. IMPRESSION: 1. Diffusely mottled sclerotic appearance of the bones compatible with known osseous metastatic disease. 2. High-riding appearance of the humeral head is suggestive of underlying rotator cuff insufficiency, particularly in the setting of known rotator cuff tear. 3. No acute fracture or traumatic malalignment. Electronically Signed   By: Lovena Le M.D.   On: 07/08/2020 19:23   CT CHEST W CONTRAST  Result Date: 07/13/2020 CLINICAL DATA:  Pleural effusion on chest x-ray. EXAM: CT CHEST WITH CONTRAST TECHNIQUE: Multidetector CT imaging of the chest was performed during intravenous contrast administration. CONTRAST:  43mL OMNIPAQUE IOHEXOL 300 MG/ML  SOLN COMPARISON:  06/25/2020 FINDINGS: Cardiovascular: The heart size is normal. No substantial pericardial effusion. No thoracic aortic aneurysm no large central pulmonary embolus Mediastinum/Nodes: No mediastinal lymphadenopathy. Calcified nodal tissue noted in the right hilum. There is no hilar lymphadenopathy. The esophagus has normal imaging features. There is no axillary lymphadenopathy. Lungs/Pleura: Patchy areas of central ground-glass attenuation are seen in the upper lobes, right greater than left, decreased in the interval. There is dependent bilateral lower lobe collapse/consolidation with small to moderate bilateral pleural effusions. Upper Abdomen: Heterogeneous liver parenchyma is nonspecific but may be related to fatty deposition or cirrhosis. Calcified granulomata noted in the spleen.  Musculoskeletal: Widespread sclerotic bone metastases again noted. IMPRESSION: 1. Patchy areas of central ground-glass attenuation in the upper lobes, right greater than left, decreased in the interval. Imaging features likely reflect improving infectious/inflammatory etiology. 2. Small to moderate bilateral pleural effusions with dependent bilateral lower lobe collapse/consolidation. 3. Widespread sclerotic bone metastases. 4. Heterogeneous liver parenchyma, nonspecific but may be related to fatty deposition or cirrhosis. Metastatic disease considered less likely but not excluded. Electronically Signed   By: Misty Stanley M.D.   On: 07/13/2020 09:23   CT ANGIO CHEST PE W OR WO CONTRAST  Result Date: 06/25/2020 CLINICAL DATA:  Worsening shortness of breath EXAM: CT ANGIOGRAPHY CHEST WITH CONTRAST TECHNIQUE: Multidetector CT imaging of the chest was performed using the standard protocol during bolus administration of intravenous contrast. Multiplanar CT image reconstructions and MIPs were obtained to evaluate the vascular anatomy. CONTRAST:  169mL OMNIPAQUE IOHEXOL 350 MG/ML SOLN COMPARISON:  Radiograph same day FINDINGS: Cardiovascular: There is a optimal opacification of the pulmonary arteries. There is no central,segmental, or subsegmental filling defects within the pulmonary arteries. There is mild cardiomegaly present. No pericardial effusion or thickening. No evidence right heart strain. There is normal three-vessel brachiocephalic anatomy without proximal stenosis. The thoracic aorta is normal in appearance. Mediastinum/Nodes: No hilar, mediastinal, or axillary adenopathy. Thyroid gland, trachea, and esophagus demonstrate no significant findings. Lungs/Pleura: There is moderate to large bilateral pleural effusion with adjacent compressive atelectasis seen. Patchy rounded ground-glass opacities are seen predominantly within the right upper lung and right middle lobe. Upper Abdomen:  No acute abnormalities  present in the visualized portions of the upper abdomen. There is diffuse anasarca present. Musculoskeletal: Extensive osseous sclerotic blastic lesions are seen throughout the visualized portion of the axial and appendicular skeleton. Review of the MIP images confirms the above findings. IMPRESSION: No central, segmental, or subsegmental pulmonary embolism Moderate to large bilateral pleural effusions with adjacent compressive atelectasis. Ground-glass opacities within the right upper lung and right middle lobe, likely due to infectious or inflammatory etiology. Extensive diffuse osseous metastatic disease. Electronically Signed   By: Prudencio Pair M.D.   On: 06/25/2020 03:11   MR BRAIN W WO CONTRAST  Result Date: 06/25/2020 CLINICAL DATA:  Left-sided weakness, metastatic prostate cancer EXAM: MRI HEAD WITHOUT AND WITH CONTRAST TECHNIQUE: Multiplanar, multiecho pulse sequences of the brain and surrounding structures were obtained without and with intravenous contrast. CONTRAST:  41mL GADAVIST GADOBUTROL 1 MMOL/ML IV SOLN COMPARISON:  2020 FINDINGS: Brain: There is thickening and enhancement along the posterior falx measuring 5 mm in thickness. The adjacent superior sagittal sinus is uninvolved. There is new minimal adjacent T2 FLAIR hyperintensity in the right parietal lobe that may reflect associated edema. There is no acute infarction or intracranial hemorrhage. There is no parenchymal mass or mass effect. There is no hydrocephalus or extra-axial fluid collection. Prominence of the ventricles and sulci reflects minor generalized parenchymal volume loss. Patchy foci of T2 hyperintensity in the supratentorial white matter are nonspecific but probably reflect mild chronic microvascular ischemic changes. These findings have progressed since 2020. Vascular: Major vessel flow voids at the skull base are preserved. Skull and upper cervical spine: Abnormal marrow signal likely reflecting diffuse metastatic disease.  Sinuses/Orbits: Chronic right maxillary sinusitis. Orbits are unremarkable. Other: Sella is unremarkable.  Bilateral mastoid effusions. IMPRESSION: No acute infarction. Thickening enhancement along the posterior falx cerebri with possible minimal associated parenchymal edema. Differential considerations include dural-based metastasis (more likely given normal appearance on prior study) and meningioma. Diffuse osseous metastatic disease. Electronically Signed   By: Macy Mis M.D.   On: 06/25/2020 08:24   MR CERVICAL SPINE W WO CONTRAST  Addendum Date: 07/05/2020   ADDENDUM REPORT: 07/05/2020 13:24 ADDENDUM: I was subsequently contacted by Dr. Curly Shores of neurology who directed my attention to subtle dural thickening and enhancement at the C5-C6 level. The dura measures 1-2 mm in thickness at this level. This finding may be postoperative in etiology. Trace dural-based tumor is difficult to definitively exclude. Consider contrast-enhanced MRI follow-up for surveillance. No resultant significant spinal cord mass effect at this level. This addendum was discussed with Dr. Curly Shores by telephone at approximately 1:30 p.m. on 07/05/2020. Electronically Signed   By: Kellie Simmering DO   On: 07/05/2020 13:24   Result Date: 07/05/2020 CLINICAL DATA:  Myelopathy, acute or progressive; left arm weakness. Additional history provided: History of prostate cancer metastatic to bone, status post cervical laminectomy with tumor removal in June 2020. EXAM: MRI CERVICAL SPINE WITHOUT AND WITH CONTRAST TECHNIQUE: Multiplanar and multiecho pulse sequences of the cervical spine, to include the craniocervical junction and cervicothoracic junction, were obtained without and with intravenous contrast. CONTRAST:  34mL GADAVIST GADOBUTROL 1 MMOL/ML IV SOLN COMPARISON:  Radiographs of the cervical spine 02/20/2019. Cervical spine MRI 02/19/2019. Cervical spine MRI 02/20/2019. FINDINGS: Alignment: Reversal of the expected cervical lordosis. No  significant spondylolisthesis. Vertebrae: Vertebral body height is maintained. Diffuse metastatic disease throughout the cervical spine. Since the prior MRI of 02/20/2019, there has been interval C4, C5 and C6 laminectomies for resection of epidural metastatic  tumor and microdiscectomy. Multilevel degenerative endplate irregularity. Cord: No spinal cord signal abnormality. Posterior Fossa, vertebral arteries, paraspinal tissues: No abnormality identified within included portions of the posterior fossa. Bilateral mastoid effusions. Flow voids preserved within the imaged cervical vertebral arteries. Paraspinal soft tissues within normal limits. Unchanged nonspecific 13 mm round T2 hyperintense subcutaneous lesion within the posterior right upper back (series 8, image 36). Disc levels: Unless otherwise stated, the level by level findings below have not significantly changed since prior MRIs of 02/20/2019 and 02/19/2019. Severe disc degeneration at C3-C4, C4-C5, C5-C6, C6-C7 and C7-T1. C2-C3: No significant disc herniation or stenosis. C3-C4: Disc bulge. Uncovertebral and facet hypertrophy. The disc bulge effaces the ventral thecal sac, contacting and minimally flattening the ventral spinal cord with overall mild relative spinal canal narrowing. Moderate/severe left neural foraminal narrowing. C4-C5: Interval laminectomies for resection of previous dorsal epidural tumor. Posterior disc osteophyte complex. Uncinate and facet hypertrophy. No significant spinal canal stenosis. Bilateral neural foraminal narrowing (mild right, moderate left). C5-C6: Interval laminectomies for resection of previous dorsal epidural tumor. Posterior disc osteophyte complex. Uncinate and facet hypertrophy. The disc osteophyte complex minimally effaces the ventral thecal sac without significant spinal canal stenosis. Mild/moderate bilateral neural foraminal narrowing. C6-C7: Posterior disc osteophyte complex. Uncinate and facet hypertrophy. Mild  spinal canal stenosis. Bilateral neural foraminal narrowing (mild right, moderate left). C7-T1: This level is imaged sagittally. No significant spinal canal stenosis. Mild bilateral neural foraminal narrowing. IMPRESSION: Since the prior MRI of 02/20/2019, there has been interval C4, C5 and C6 laminectomies for resection of previous dorsal epidural metastatic tumor. No evidence of recurrent epidural tumor at this site. No convincing evidence of epidural metastatic disease within the cervical spine. 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 Cervical spondylosis as outlined and having not significantly changed from the cervical spine MRI of 02/20/2020. Severe disc degeneration throughout the majority of the cervical spine. No more than mild spinal canal stenosis at any level. At C3-C4, a disc bulge contributes to mild relative spinal canal narrowing, contacting and minimally flattening the ventral spinal cord. Multilevel neural foraminal narrowing greatest on the left at C3-C4 (moderate/severe), on the left at C4-C5 (moderate) and on the left at C6-C7 (moderate). 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 EXAM: PORTABLE CHEST 1 VIEW COMPARISON:  06/28/2020 FINDINGS: The heart size and mediastinal contours are within normal limits. No significant recurrence of left pleural fluid since thoracentesis with probable small pleural effusion remaining. No pneumothorax. Stable bibasilar atelectasis. Stable diffuse sclerotic metastatic disease involving the thoracic spine. IMPRESSION: No significant recurrence of left pleural fluid since thoracentesis with probable small pleural effusion remaining. Stable bibasilar atelectasis. Electronically Signed   By: Aletta Edouard M.D.   On: 07/01/2020 08:03   DG  CHEST PORT 1 VIEW  Result Date: 06/28/2020 CLINICAL DATA:  Shortness of breath. EXAM: PORTABLE CHEST 1 VIEW COMPARISON:  06/27/2020. FINDINGS: Mediastinum and heart size normal. Progression of diffuse left lung interstitial infiltrates/edema. Diffuse right lung interstitial infiltrates/edema noted on today's exam. Persistent left-sided pleural effusion without interim change. No pneumothorax. Extensive sclerotic bony metastatic disease again noted. IMPRESSION: 1. Progression of diffuse left lung interstitial infiltrates/edema. Diffuse right lung interstitial infiltrates/edema noted on today's exam. Persistent left-sided pleural effusion without interim change. 2. Extensive sclerotic bony metastatic disease again noted. Electronically Signed   By: Marcello Moores  Register   On: 06/28/2020 05:52   DG Chest Portable 1 View  Result Date: 06/24/2020 CLINICAL DATA:  Tachycardia. EXAM: PORTABLE CHEST 1 VIEW COMPARISON:  February 19, 2019 FINDINGS: There is cardiomegaly. There are moderate to large bilateral pleural effusions. There is bibasilar atelectasis. There is diffuse sclerosis throughout the visualized osseous structures most notably the thoracic spine. There is no pneumothorax. IMPRESSION: 1. Moderate to large bilateral pleural effusions with adjacent atelectasis. 2. Cardiomegaly. 3. Progressive sclerosis throughout the visualized osseous structures consistent with metastatic disease. Electronically Signed   By: Constance Holster M.D.   On: 06/24/2020 21:33   US LIVER DOPPLER  Result Date: 06/25/2020 CLINICAL DATA:  64 year old male with a history of liver disease EXAM: DUPLEX ULTRASOUND OF LIVER TECHNIQUE: Color and duplex Doppler ultrasound was performed to evaluate the hepatic in-flow and out-flow vessels. COMPARISON:  None. FINDINGS: Portal Vein Velocities Main:  43 cm/sec Right:  28 cm/sec Left:  18 cm/sec Hepatic Vein Velocities Right:  77 cm/sec Middle:  78 cm/sec Left:  92 cm/sec Hepatic Artery Velocity:   135 cm/sec Splenic Vein Velocity:  24 cm/sec Varices: Absent Ascites: Absent Spleen volume estimated 52 cubic cm Pleural fluid bilaterally IMPRESSION: Unremarkable duplex of the hepatic vasculature. Bilateral pleural effusions Electronically Signed   By: Corrie Mckusick D.O.   On: 06/25/2020 14:47   ECHOCARDIOGRAM COMPLETE BUBBLE STUDY  Result Date: 06/25/2020    ECHOCARDIOGRAM REPORT   Patient Name:   REYHAN MORONTA Date of Exam: 06/25/2020 Medical Rec #:  [id number]    Height:       73.0 in Accession #:    [id number]   Weight:       150.0 lb Date of Birth:  28-Jun-1956    BSA:          1.904 m Patient Age:    59 years     BP:           111/75 mmHg Patient Gender: M            HR:           126 bpm. Exam Location:  Inpatient Procedure: 2D Echo, Cardiac Doppler, Color Doppler and Saline Contrast Bubble            Study Indications:    Atrial fibrillation with rapid ventricular response (Advance)  History:        Patient has no prior history of Echocardiogram examinations.  Sonographer:    Bernadene Person RDCS Referring Phys: [id number] [place]  1. Left ventricular ejection fraction, by estimation, is 50 to 55%. The left ventricle has low normal function. The left ventricle has no regional wall motion abnormalities. Left ventricular diastolic parameters were normal. There is the interventricular septum is flattened in diastole ('D' shaped left ventricle), consistent with right ventricular volume overload.  2. Right ventricular systolic function is mildly reduced. The right ventricular size is mildly enlarged. There is mildly elevated pulmonary artery systolic pressure. The estimated right ventricular systolic pressure is 37.8 mmHg.  3. Left atrial size was mildly dilated.  4. Right atrial size was mildly dilated.  5. Large pleural effusion in the left lateral region.  6. The mitral valve is normal in structure. Mild mitral valve regurgitation.  7. Tricuspid valve regurgitation is mild to moderate.  8.  The aortic valve is normal in structure. Aortic valve regurgitation is not visualized. Mild aortic valve sclerosis is present, with no evidence of aortic valve stenosis.  9. The inferior vena cava  is dilated in size with <50% respiratory variability, suggesting right atrial pressure of 15 mmHg. 10. Evidence of atrial level shunting detected by color flow Doppler. Agitated saline contrast bubble study was positive with shunting observed within 3-6 cardiac cycles suggestive of interatrial shunt. Comparison(s): No prior Echocardiogram. Findings are suggestive of right heart volume overload due to left-to-right shunt and there is a small right to left interatrial shunt by saline contrast study. However, an atrial septal defect could not be outlined with 2D and color Doppler imaging. Consider TEE or CT angiography to evaluate for anormalous pulmonary vein return or occult ASD. Qp:Qs shunt fraction could not be accurately calculated due to poor quality RV outflow imaging. FINDINGS  Left Ventricle: Left ventricular ejection fraction, by estimation, is 50 to 55%. The left ventricle has low normal function. The left ventricle has no regional wall motion abnormalities. The left ventricular internal cavity size was normal in size. There is no left ventricular hypertrophy. The interventricular septum is flattened in diastole ('D' shaped left ventricle), consistent with right ventricular volume overload. Left ventricular diastolic parameters were normal. Normal left ventricular filling pressure. Right Ventricle: The right ventricular size is mildly enlarged. No increase in right ventricular wall thickness. Right ventricular systolic function is mildly reduced. There is mildly elevated pulmonary artery systolic pressure. The tricuspid regurgitant  velocity is 2.60 m/s, and with an assumed right atrial pressure of 15 mmHg, the estimated right ventricular systolic pressure is 65.4 mmHg. Left Atrium: Left atrial size was mildly  dilated. Right Atrium: Right atrial size was mildly dilated. Pericardium: There is no evidence of pericardial effusion. Mitral Valve: The mitral valve is normal in structure. Mild mitral valve regurgitation. Tricuspid Valve: The tricuspid valve is grossly normal. Tricuspid valve regurgitation is mild to moderate. Aortic Valve: The aortic valve is normal in structure. Aortic valve regurgitation is not visualized. Mild aortic valve sclerosis is present, with no evidence of aortic valve stenosis. Pulmonic Valve: The pulmonic valve was not well visualized. Pulmonic valve regurgitation is not visualized. Aorta: The aortic root and ascending aorta are structurally normal, with no evidence of dilitation. Venous: The inferior vena cava is dilated in size with less than 50% respiratory variability, suggesting right atrial pressure of 15 mmHg. IAS/Shunts: There is redundancy of the interatrial septum. Evidence of atrial level shunting detected by color flow Doppler. Agitated saline contrast was given intravenously to evaluate for intracardiac shunting. Agitated saline contrast bubble study was  positive with shunting observed within 3-6 cardiac cycles suggestive of interatrial shunt. Additional Comments: There is a large pleural effusion in the left lateral region.  LEFT VENTRICLE PLAX 2D LVIDd:         4.10 cm     Diastology LVIDs:         3.00 cm     LV e' medial:    13.40 cm/s LV PW:         1.00 cm     LV E/e' medial:  6.5 LV IVS:        0.90 cm     LV e' lateral:   9.00 cm/s LVOT diam:     2.10 cm     LV E/e' lateral: 9.7 LV SV:         68 LV SV Index:   36 LVOT Area:     3.46 cm  LV Volumes (MOD) LV vol d, MOD A2C: 63.8 ml LV vol d, MOD A4C: 58.6 ml LV vol s, MOD A2C: 32.5 ml LV vol  s, MOD A4C: 27.6 ml LV SV MOD A2C:     31.3 ml LV SV MOD A4C:     58.6 ml LV SV MOD BP:      32.7 ml RIGHT VENTRICLE RV S prime:     6.85 cm/s TAPSE (M-mode): 1.7 cm LEFT ATRIUM             Index       RIGHT ATRIUM           Index LA diam:         4.00 cm 2.10 cm/m  RA Area:     21.40 cm LA Vol (A2C):   37.5 ml 19.70 ml/m RA Volume:   61.60 ml  32.35 ml/m LA Vol (A4C):   53.1 ml 27.89 ml/m LA Biplane Vol: 44.8 ml 23.53 ml/m  AORTIC VALVE             PULMONIC VALVE LVOT Vmax:   89.40 cm/s  RVOT Peak grad: 2 mmHg LVOT Vmean:  65.500 cm/s LVOT VTI:    0.197 m  AORTA Ao Root diam: 2.70 cm Ao Asc diam:  2.90 cm MITRAL VALVE               TRICUSPID VALVE MV Area (PHT): 3.77 cm    TR Peak grad:   27.0 mmHg MV Decel Time: 201 msec    TR Vmax:        260.00 cm/s MV E velocity: 87.50 cm/s MV A velocity: 90.80 cm/s  SHUNTS MV E/A ratio:  0.96        Systemic VTI:  0.20 m                            Systemic Diam: 2.10 cm                            Pulmonic VTI:  0.087 m Dani Gobble Croitoru MD Electronically signed by Sanda Klein MD Signature Date/Time: 06/25/2020/12:54:37 PM    Final    VAS [name] LOWER EXTREMITY VENOUS (DVT)  Result Date: 07/14/2020  Lower Venous DVT Study Indications: Swelling.  Limitations: Poor ultrasound/tissue interface. Comparison Study: No prior studies. Performing Technologist: Oliver Hum RVT  Examination Guidelines: A complete evaluation includes B-mode imaging, spectral Doppler, color Doppler, and power Doppler as needed of all accessible portions of each vessel. Bilateral testing is considered an integral part of a complete examination. Limited examinations for reoccurring indications may be performed as noted. The reflux portion of the exam is performed with the patient in reverse Trendelenburg.  +---------+---------------+---------+-----------+----------+--------------+ RIGHT    CompressibilityPhasicitySpontaneityPropertiesThrombus Aging +---------+---------------+---------+-----------+----------+--------------+ CFV      Full           Yes      Yes                                 +---------+---------------+---------+-----------+----------+--------------+ SFJ      Full                                                         +---------+---------------+---------+-----------+----------+--------------+ FV Prox  Full                                                        +---------+---------------+---------+-----------+----------+--------------+  FV Mid   Full                                                        +---------+---------------+---------+-----------+----------+--------------+ FV DistalFull                                                        +---------+---------------+---------+-----------+----------+--------------+ PFV      Full                                                        +---------+---------------+---------+-----------+----------+--------------+ POP      Full           Yes      Yes                                 +---------+---------------+---------+-----------+----------+--------------+ PTV      Full                                                        +---------+---------------+---------+-----------+----------+--------------+ PERO     Full                                                        +---------+---------------+---------+-----------+----------+--------------+   +---------+---------------+---------+-----------+----------+--------------+ LEFT     CompressibilityPhasicitySpontaneityPropertiesThrombus Aging +---------+---------------+---------+-----------+----------+--------------+ CFV      Full           Yes      Yes                                 +---------+---------------+---------+-----------+----------+--------------+ SFJ      Full                                                        +---------+---------------+---------+-----------+----------+--------------+ FV Prox  Full                                                        +---------+---------------+---------+-----------+----------+--------------+ FV Mid   Full                                                         +---------+---------------+---------+-----------+----------+--------------+  FV DistalFull                                                        +---------+---------------+---------+-----------+----------+--------------+ PFV      Full                                                        +---------+---------------+---------+-----------+----------+--------------+ POP      Full           Yes      Yes                                 +---------+---------------+---------+-----------+----------+--------------+ PTV      Full                                                        +---------+---------------+---------+-----------+----------+--------------+ PERO     Full                                                        +---------+---------------+---------+-----------+----------+--------------+     Summary: RIGHT: - There is no evidence of deep vein thrombosis in the lower extremity.  - No cystic structure found in the popliteal fossa.  LEFT: - There is no evidence of deep vein thrombosis in the lower extremity.  - No cystic structure found in the popliteal fossa.  *See table(s) above for measurements and observations. Electronically signed by Ruta Hinds MD on 07/14/2020 at 10:12:25 AM.    Final    ECHOCARDIOGRAM LIMITED  Result Date: 07/12/2020    ECHOCARDIOGRAM LIMITED REPORT   Patient Name:   BAIRON KLEMANN Date of Exam: 07/12/2020 Medical Rec #:  [id number]    Height:       73.0 in Accession #:    [id number]   Weight:       159.0 lb Date of Birth:  11-11-1955    BSA:          1.951 m Patient Age:    5 years     BP:           116/65 mmHg Patient Gender: M            HR:           87 bpm. Exam Location:  Inpatient Procedure: Limited Echo, Limited Color Doppler and Cardiac Doppler Indications:     atrial fibrillation  History:         Patient has no prior history of Echocardiogram examinations.  Sonographer:     Johny Chess Referring Phys:  [id number] Leanor Kail  Diagnosing Phys: Loralie Champagne MD IMPRESSIONS  1. Left ventricular ejection fraction, by estimation, is 60 to 65%. The left ventricle has normal function. There is mild left ventricular hypertrophy. Left ventricular diastolic parameters are  indeterminate.  2. Right ventricular systolic function is normal. The right ventricular size is mildly enlarged. There is mildly elevated pulmonary artery systolic pressure. The estimated right ventricular systolic pressure is 46.6 mmHg. Mildly D-shaped interventricular septum suggests elevated RV pressure.  3. Left atrial size was moderately dilated.  4. Right atrial size was moderately dilated.  5. The mitral valve is normal in structure. Trivial mitral valve regurgitation. No evidence of mitral stenosis.  6. The aortic valve is tricuspid. Aortic valve regurgitation is not visualized. Mild aortic valve sclerosis is present, with no evidence of aortic valve stenosis.  7. The inferior vena cava is dilated in size with <50% respiratory variability, suggesting right atrial pressure of 15 mmHg.  8. Significant left pleural effusion. FINDINGS  Left Ventricle: Left ventricular ejection fraction, by estimation, is 60 to 65%. The left ventricle has normal function. The left ventricular internal cavity size was normal in size. There is mild left ventricular hypertrophy. Left ventricular diastolic  parameters are indeterminate. Right Ventricle: The right ventricular size is mildly enlarged. No increase in right ventricular wall thickness. Right ventricular systolic function is normal. There is mildly elevated pulmonary artery systolic pressure. The tricuspid regurgitant velocity is 2.55 m/s, and with an assumed right atrial pressure of 15 mmHg, the estimated right ventricular systolic pressure is 59.9 mmHg. Left Atrium: Left atrial size was moderately dilated. Right Atrium: Right atrial size was moderately dilated. Pericardium: Significant left pleural effusion. Trivial pericardial  effusion is present. Mitral Valve: The mitral valve is normal in structure. Trivial mitral valve regurgitation. No evidence of mitral valve stenosis. Tricuspid Valve: The tricuspid valve is normal in structure. Tricuspid valve regurgitation is mild. Aortic Valve: The aortic valve is tricuspid. Aortic valve regurgitation is not visualized. Mild aortic valve sclerosis is present, with no evidence of aortic valve stenosis. Venous: The inferior vena cava is dilated in size with less than 50% respiratory variability, suggesting right atrial pressure of 15 mmHg. IAS/Shunts: No atrial level shunt detected by color flow Doppler. LEFT VENTRICLE PLAX 2D LVIDd:         4.50 cm LVIDs:         3.40 cm LV PW:         1.10 cm LV IVS:        0.70 cm  IVC IVC diam: 2.30 cm LEFT ATRIUM         Index LA diam:    4.30 cm 2.20 cm/m  TRICUSPID VALVE TR Peak grad:   26.0 mmHg TR Vmax:        255.00 cm/s Loralie Champagne MD Electronically signed by Loralie Champagne MD Signature Date/Time: 07/12/2020/4:21:33 PM    Final (Updated)    IR THORACENTESIS ASP PLEURAL SPACE W/IMG GUIDE  Result Date: 06/28/2020 INDICATION: Patient with a history of prostate cancer with extensive metastases now has new onset pleural effusions. Interventional radiology asked to perform a therapeutic and diagnostic thoracentesis. EXAM: ULTRASOUND GUIDED THORACENTESIS MEDICATIONS: 1% lidocaine 10 mL COMPLICATIONS: None immediate. PROCEDURE: An ultrasound guided thoracentesis was thoroughly discussed with the patient and questions answered. The benefits, risks, alternatives and complications were also discussed. The patient understands and wishes to proceed with the procedure. Written consent was obtained. Ultrasound was performed to localize and mark an adequate pocket of fluid in the left chest. The area was then prepped and draped in the normal sterile fashion. 1% Lidocaine was used for local anesthesia. Under ultrasound guidance a 6 Fr Safe-T-Centesis catheter was  introduced. Thoracentesis was performed. The catheter  was removed and a dressing applied. FINDINGS: A total of approximately 1.3 L of bright yellow fluid was removed. Samples were sent to the laboratory as requested by the clinical team. IMPRESSION: Successful ultrasound guided left thoracentesis yielding 1.3 L of pleural fluid. Read by: Soyla Dryer, NP Electronically Signed   By: Corrie Mckusick D.O.   On: 06/28/2020 12:11   IR THORACENTESIS ASP PLEURAL SPACE W/IMG GUIDE  Result Date: 06/27/2020 INDICATION: Patient with a history of prostate cancer with extensive metastases now has new onset pleural effusions. Interventional radiology asked to perform a therapeutic and diagnostic thoracentesis. EXAM: ULTRASOUND GUIDED THORACENTESIS MEDICATIONS: 1% lidocaine 20 mL COMPLICATIONS: None immediate. PROCEDURE: An ultrasound guided thoracentesis was thoroughly discussed with the patient and questions answered. The benefits, risks, alternatives and complications were also discussed. The patient understands and wishes to proceed with the procedure. Written consent was obtained. Ultrasound was performed to localize and mark an adequate pocket of fluid in the right chest. The area was then prepped and draped in the normal sterile fashion. 1% Lidocaine was used for local anesthesia. Under ultrasound guidance a 6 Fr Safe-T-Centesis catheter was introduced. Thoracentesis was performed. The catheter was removed and a dressing applied. FINDINGS: A total of approximately 1.6 L of dark yellow fluid was removed. Samples were sent to the laboratory as requested by the clinical team. IMPRESSION: Successful ultrasound guided right thoracentesis yielding 1.6 L of pleural fluid. Read by: Soyla Dryer, NP Electronically Signed   By: Markus Daft M.D.   On: 06/27/2020 12:29     Subjective: No acute issues or events overnight denies chest pain, shortness of breath, nausea, vomiting, diarrhea, constipation, headache, fevers,  chills.   Discharge Exam: Vitals:   07/19/20 0501 07/19/20 1007  BP: 105/65 95/65  Pulse: (!) 58   Resp: 18   Temp: 97.6 F (36.4 C)   SpO2: 100%    Vitals:   07/18/20 2106 07/19/20 0043 07/19/20 0501 07/19/20 1007  BP: (!) 94/58 99/65 105/65 95/65  Pulse: 72 77 (!) 58   Resp:   18   Temp:   97.6 F (36.4 C)   TempSrc:   Oral   SpO2:   100%   Weight:  64.1 kg    Height:        General: Pt is alert, awake, not in acute distress Cardiovascular: RRR, S1/S2 +, no rubs, no gallops Respiratory: CTA bilaterally, no wheezing, no rhonchi Abdominal: Soft, NT, ND, bowel sounds + Extremities: no edema, no cyanosis    The results of significant diagnostics from this hospitalization (including imaging, microbiology, ancillary and laboratory) are listed below for reference.     Microbiology: No results found for this or any previous visit (from the past 240 hour(s)).   Labs: BNP (last 3 results) Recent Labs    06/24/20 1852 07/12/20 1512  BNP 281.3* 915.0*   Basic Metabolic Panel: Recent Labs  Lab 07/15/20 0554 07/16/20 0404 07/17/20 0542 07/18/20 0333 07/19/20 0136  NA 137 140 138 139 139  K 3.8 4.4 4.0 4.1 3.8  CL 101 103 104 101 101  CO2 26 27 27 29 28   GLUCOSE 92 97 94 79 97  BUN 22 27* 27* 28* 30*  CREATININE 0.73 0.76 0.74 0.93 0.86  CALCIUM 8.3* 8.4* 8.7* 8.9 8.8*   Liver Function Tests: Recent Labs  Lab 07/12/20 1512 07/13/20 0447  AST 17 16  ALT 17 17  ALKPHOS 2,412* 2,382*  BILITOT 0.9 1.1  PROT 5.8* 5.9*  ALBUMIN  2.9* 2.8*   No results for input(s): LIPASE, AMYLASE in the last 168 hours. No results for input(s): AMMONIA in the last 168 hours. CBC: Recent Labs  Lab 07/13/20 0447 07/14/20 0757 07/15/20 0554 07/16/20 0404 07/17/20 0542  WBC 5.7 4.3 4.2 4.1 4.4  NEUTROABS  --   --  2.3 2.2 2.4  HGB 9.0* 9.4* 8.5* 8.9* 9.5*  HCT 30.3* 31.7* 28.2* 29.8* 32.0*  MCV 96.2 96.6 96.6 97.4 97.6  PLT 293 275 277 266 256   Cardiac  Enzymes: No results for input(s): CKTOTAL, CKMB, CKMBINDEX, TROPONINI in the last 168 hours. BNP: Invalid input(s): POCBNP CBG: No results for input(s): GLUCAP in the last 168 hours. D-Dimer No results for input(s): DDIMER in the last 72 hours. Hgb A1c No results for input(s): HGBA1C in the last 72 hours. Lipid Profile No results for input(s): CHOL, HDL, LDLCALC, TRIG, CHOLHDL, LDLDIRECT in the last 72 hours. Thyroid function studies No results for input(s): TSH, T4TOTAL, T3FREE, THYROIDAB in the last 72 hours.  Invalid input(s): FREET3 Anemia work up No results for input(s): VITAMINB12, FOLATE, FERRITIN, TIBC, IRON, RETICCTPCT in the last 72 hours. Urinalysis    Component Value Date/Time   COLORURINE AMBER (A) 06/24/2020 2058   APPEARANCEUR HAZY (A) 06/24/2020 2058   LABSPEC 1.026 06/24/2020 2058   PHURINE 5.0 06/24/2020 2058   GLUCOSEU NEGATIVE 06/24/2020 2058   HGBUR SMALL (A) 06/24/2020 2058   [place] NEGATIVE 06/24/2020 2058   [place] NEGATIVE 06/24/2020 2058   PROTEINUR 30 (A) 06/24/2020 2058   NITRITE NEGATIVE 06/24/2020 2058   LEUKOCYTESUR NEGATIVE 06/24/2020 2058   Sepsis Labs Invalid input(s): PROCALCITONIN,  WBC,  LACTICIDVEN Microbiology No results found for this or any previous visit (from the past 240 hour(s)).   Time coordinating discharge: Over 30 minutes  SIGNED:   Little Ishikawa, DO [facility] 07/19/2020, 3:11 PM Pager   If 7PM-7AM, please contact night-coverage [email or web address]

## 2020-07-19 NOTE — TOC Transition Note (Signed)
Transition of Care [facility]) - CM/SW Discharge Note   Patient Details  Name: Torey Reinard MRN: [id number] Date of Birth: 1956-02-06  Transition of Care [facility] CM/SW Contact:  Zenon Mayo, RN Phone Number: 07/19/2020, 2:01 PM   Clinical Narrative:    Patient is for dc today, he is set up with outpatient pt. NCM faxed pt note to [facility] to [place] at [phone].   Final next level of care: OP Rehab Barriers to Discharge: No Barriers Identified   Patient Goals and CMS Choice     Choice offered to / list presented to : NA  Discharge Placement                       Discharge Plan and Services   Discharge Planning Services: CM Consult              DME Agency: NA       HH Arranged: NA          Social Determinants of Health (SDOH) Interventions Food Insecurity Interventions: Intervention Not Indicated Transportation Interventions: Intervention Not Indicated   Readmission Risk Interventions Readmission Risk Prevention Plan 07/19/2020  Transportation Screening Complete  Medication Review ([place]) Complete  PCP or Specialist appointment within 3-5 days of discharge Complete  HRI or [place] Complete  SW Recovery Care/Counseling Consult Complete  [place] Not Applicable  Some recent data might be hidden

## 2020-07-19 NOTE — TOC Initial Note (Signed)
Transition of Care [facility]) - Initial/Assessment Note    Patient Details  Name: Chris Morales MRN: [id number] Date of Birth: [birth year]  [facility]) CM/SW Contact:    Zenon Mayo, RN Phone Number: 07/19/2020, 1:49 PM  Clinical Narrative:                 Patient is for dc today, he is set up with outpatient pt. NCM faxed pt note to [facility] to [place] at [phone].    Expected Discharge Plan: OP Rehab Barriers to Discharge: No Barriers Identified   Patient Goals and CMS Choice     Choice offered to / list presented to : NA  Expected Discharge Plan and Services Expected Discharge Plan: OP Rehab   Discharge Planning Services: CM Consult   Living arrangements for the past 2 months: Single Family Home Expected Discharge Date: 07/19/20                 DME Agency: NA       HH Arranged: NA          Prior Living Arrangements/Services Living arrangements for the past 2 months: Single Family Home Lives with:: Siblings Patient language and need for interpreter reviewed:: Yes Do you feel safe going back to the place where you live?: Yes      Need for Family Participation in Patient Care: No (Comment) Care giver support system in place?: No (comment)   Criminal Activity/Legal Involvement Pertinent to Current Situation/Hospitalization: No - Comment as needed  Activities of Daily Living Home Assistive Devices/Equipment: None ADL Screening (condition at time of admission) Patient's cognitive ability adequate to safely complete daily activities?: Yes Is the patient deaf or have difficulty hearing?: No Does the patient have difficulty seeing, even when wearing glasses/contacts?: No Does the patient have difficulty concentrating, remembering, or making decisions?: No Patient able to express need for assistance with ADLs?: Yes Does the patient have difficulty dressing or bathing?: No Independently performs ADLs?: Yes (appropriate for developmental  age) Does the patient have difficulty walking or climbing stairs?: Yes Weakness of Legs: Both Weakness of Arms/Hands: Both  Permission Sought/Granted                  Emotional Assessment Appearance:: Appears stated age Attitude/Demeanor/Rapport: Engaged Affect (typically observed): Appropriate Orientation: : Oriented to Self, Oriented to Situation, Oriented to  Time, Oriented to Place Alcohol / Substance Use: Not Applicable Psych Involvement: No (comment)  Admission diagnosis:  Volume overload [E87.70] Patient Active Problem List   Diagnosis Date Noted  . Acute on chronic diastolic CHF (congestive heart failure) ([place]) 07/13/2020  . Volume overload 07/12/2020  . Pulmonary hypertension ([place]) 07/12/2020  . Chronic right-sided CHF (congestive heart failure) ([place]) 07/12/2020  . Hypoalbuminemia 07/12/2020  . Hypothyroidism 07/12/2020  . Peripheral edema   . Shortness of breath   . Lower extremity edema   . Shoulder weakness   . Metastasis to bone ([place])   . Obstructive uropathy   . Cancer associated pain   . Acute on chronic anemia   . Atrial fibrillation ([place])   . Debility 07/05/2020  . Pleural effusion   . SOB (shortness of breath)   . Status post thoracentesis   . Palliative care by specialist   . Goals of care, counseling/discussion   . Encounter for hospice care discussion   . Full code status   . Atrial fibrillation with rapid ventricular response ([place]) 06/25/2020  . 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  Left-sided weakness 06/25/2020  . Foley catheter in place on admission 06/25/2020  . Pleural effusion, bilateral 06/25/2020  . Normocytic anemia 06/25/2020  . Elevated d-dimer 06/25/2020  . 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 Atrial fibrillation, chronic ([place]) 06/25/2020  . Malignant neoplasm of prostate metastatic to bone ([place]) 02/22/2019  . Metastasis to spinal column ([place]) 02/22/2019  . AKI (acute kidney injury) ([place]) 02/20/2019  . Prostate CA ([place]) 02/20/2019  .  Cervical myelopathy ([place]) 02/19/2019   PCP:  Patient, No Pcp Per Pharmacy:   [name] [place] [place] [address] [place] [address] Phone: [phone] Fax: [phone]     Social Determinants of Health (SDOH) Interventions Food Insecurity Interventions: Intervention Not Indicated Transportation Interventions: Intervention Not Indicated  Readmission Risk Interventions Readmission Risk Prevention Plan 07/19/2020  Transportation Screening Complete  Medication Review ([place]) Complete  PCP or Specialist appointment within 3-5 days of discharge Complete  HRI or [place] Complete  SW Recovery Care/Counseling Consult Complete  [place] Not Applicable  Some recent data might be hidden

## 2020-07-19 NOTE — Progress Notes (Signed)
Progress Note  Patient Name: Chris Morales Date of Encounter: 07/19/2020  [facility] Cardiologist: Evalina Field, MD   Subjective   Feels well. Ambulating the halls without difficulty. Wants to go home.  Remains in rate controlled Aflutter. Cr stable at 0.86. Net negative 1800 yesterday; wt 141.4  Inpatient Medications    Scheduled Meds: . apixaban  5 mg Oral BID  . bicalutamide  50 mg Oral Daily  . digoxin  0.125 mg Oral Daily  . diltiazem  120 mg Oral Daily  . folic acid  1 mg Oral Daily  . furosemide  40 mg Oral BID  . levothyroxine  25 mcg Oral QAC breakfast  . metoprolol succinate  100 mg Oral BID   Continuous Infusions:  PRN Meds: acetaminophen, ondansetron **OR** ondansetron (ZOFRAN) IV, polyethylene glycol   Vital Signs    Vitals:   07/18/20 1946 07/18/20 2106 07/19/20 0043 07/19/20 0501  BP: 97/62 (!) 94/58 99/65 105/65  Pulse: 71 72 77 (!) 58  Resp: 18   18  Temp: 98.2 F (36.8 C)   97.6 F (36.4 C)  TempSrc: Oral   Oral  SpO2: 99%   100%  Weight:   64.1 kg   Height:        Intake/Output Summary (Last 24 hours) at 07/19/2020 1003 Last data filed at 07/19/2020 0834 Gross per 24 hour  Intake 1660 ml  Output 3300 ml  Net -1640 ml   Last 3 Weights 07/19/2020 07/18/2020 07/17/2020  Weight (lbs) 141 lb 6.4 oz 141 lb 143 lb 1.3 oz  Weight (kg) 64.139 kg 63.957 kg 64.9 kg      Telemetry    Rate controlled aflutter - Personally Reviewed  ECG    None - Personally Reviewed  Physical Exam   GEN: Thin cachectic appearing male, comfortable   Neck: No JVD Cardiac: bradycardic, regular, no murmurs, rubs, or gallops.  Respiratory: Clear to auscultation bilaterally. GI: Soft, nontender, non-distended  MS: No edema; No deformity. Neuro:  Nonfocal  Psych: Normal affect   Labs    High Sensitivity Troponin:   Recent Labs  Lab 06/25/20 1546 07/12/20 1730 07/12/20 1900  TROPONINIHS 15 17 19*      Chemistry Recent Labs  Lab  07/12/20 1512 07/12/20 1512 07/13/20 0447 07/14/20 0757 07/17/20 0542 07/18/20 0333 07/19/20 0136  NA 139   < > 137   < > 138 139 139  K 4.3   < > 4.0   < > 4.0 4.1 3.8  CL 106   < > 102   < > 104 101 101  CO2 21*   < > 29   < > 27 29 28   GLUCOSE 103*   < > 94   < > 94 79 97  BUN 19   < > 22   < > 27* 28* 30*  CREATININE 0.87   < > 0.79   < > 0.74 0.93 0.86  CALCIUM 8.1*   < > 8.4*   < > 8.7* 8.9 8.8*  PROT 5.8*  --  5.9*  --   --   --   --   ALBUMIN 2.9*  --  2.8*  --   --   --   --   AST 17  --  16  --   --   --   --   ALT 17  --  17  --   --   --   --   ALKPHOS 2,412*  --  2,382*  --   --   --   --   BILITOT 0.9  --  1.1  --   --   --   --   GFRNONAA >60   < > >60   < > >60 >60 >60  ANIONGAP 12   < > 6   < > 7 9 10    < > = values in this interval not displayed.     Hematology Recent Labs  Lab 07/15/20 0554 07/16/20 0404 07/17/20 0542  WBC 4.2 4.1 4.4  RBC 2.92* 3.06* 3.28*  HGB 8.5* 8.9* 9.5*  HCT 28.2* 29.8* 32.0*  MCV 96.6 97.4 97.6  MCH 29.1 29.1 29.0  MCHC 30.1 29.9* 29.7*  RDW 22.7* 22.8* 22.9*  PLT 277 266 256    BNP Recent Labs  Lab 07/12/20 1512  BNP 271.0*     DDimer No results for input(s): DDIMER in the last 168 hours.   Radiology    No results found.  Cardiac Studies   TTE 07/12/20: IMPRESSIONS  1. Left ventricular ejection fraction, by estimation, is 60 to 65%. The  left ventricle has normal function. There is mild left ventricular  hypertrophy. Left ventricular diastolic parameters are indeterminate.  2. Right ventricular systolic function is normal. The right ventricular  size is mildly enlarged. There is mildly elevated pulmonary artery  systolic pressure. The estimated right ventricular systolic pressure is  79.0 mmHg. Mildly D-shaped  interventricular septum suggests elevated RV pressure.  3. Left atrial size was moderately dilated.  4. Right atrial size was moderately dilated.  5. The mitral valve is normal in  structure. Trivial mitral valve  regurgitation. No evidence of mitral stenosis.  6. The aortic valve is tricuspid. Aortic valve regurgitation is not  visualized. Mild aortic valve sclerosis is present, with no evidence of  aortic valve stenosis.  7. The inferior vena cava is dilated in size with <50% respiratory  variability, suggesting right atrial pressure of 15 mmHg.  8. Significant left pleural effusion.  07/13/20 LE Doppler: Summary:  RIGHT:  - There is no evidence of deep vein thrombosis in the lower extremity.    - No cystic structure found in the popliteal fossa.    LEFT:  - There is no evidence of deep vein thrombosis in the lower extremity.    - No cystic structure found in the popliteal fossa.    *See table(s) above for measurements and observations.  CTA chest 06/25/20: IMPRESSION: No central, segmental, or subsegmental pulmonary embolism  Moderate to large bilateral pleural effusions with adjacent compressive atelectasis.  Ground-glass opacities within the right upper lung and right middle lobe, likely due to infectious or inflammatory etiology.  Extensive diffuse osseous metastatic disease.  Patient Profile     64 y.o. male with PMHof metastatic prostate cancer, permanentatrial fibrillation admitted with acute on chronic diastolic congestive heart failure. Now improving and transitioned to PO lasix.  Assessment & Plan    #Acute on chronic diastolic heart failure: Improved with NYHA class II symptoms. Wt down to 141 lbs which is new dry weight. Transitioned to PO lasix -Continue lasix 40mg  PO BID -Continue metop and digoxin -Monitor I/Os and daily weights -Low Na diet -New dry weight 141lbs -Okay to discharge home today; Cardiology follow-up arranged  #Permanent Afib/flutter Remains well rate controlled. -Continue metop and dilt -Continue digoxin -Continue apixaban for [facility]  #Pleural effusions Previously exudative. Has metastatic  prostate cancer but effusions negative for malignancy. Has required thoracentesis in the past.  CT chest 11/13 showed small to moderate bilateral effusions. Has ongoing dyspnea but remains overloaded in setting of acute on chronic HF. Will continue with ongoing diuresis and reassess once volume status improved  -Continue to monitor with diuresis; do not think needs thora at this time  #Pulmonary HTN/RV failure Unclear etiology. Previous CTA without evidence of embolus and LE dopplers without evidence of DVT. ? Chronic PE. No history of lung pathology. Currently on apixaban. -Euvolemic and transitioned to PO lasix -Continue apixaban  -CTA/LE doppler without evidence of clot -Will defere V/Q scan for now as likely would not change management and patient already on [facility]; can pursue as out-patient if needed  Other hospital issues: #Metastatic prostate cancer #Patent foramen ovale #Hypothyroidism  Okay to discharge home from CV standpoint on current regimen. Cardiology follow-up arranged with Dr. Audie Box on 12/2. Will get repeat labs at that time.   For questions or updates, please contact [place] Please consult [email or web address] for contact info under        Signed, Freada Bergeron, MD  07/19/2020, 10:03 AM

## 2020-07-21 ENCOUNTER — Other Ambulatory Visit (HOSPITAL_COMMUNITY): Payer: Self-pay | Admitting: Cardiology

## 2020-07-21 MED ORDER — DILTIAZEM HCL ER COATED BEADS 120 MG PO CP24: 120.0000 mg | ORAL_CAPSULE | Freq: Every day | ORAL | 2 refills | Status: DC

## 2020-07-24 ENCOUNTER — Other Ambulatory Visit: Payer: Self-pay

## 2020-07-24 ENCOUNTER — Inpatient Hospital Stay: Payer: PRIVATE HEALTH INSURANCE | Attending: Oncology | Admitting: Oncology

## 2020-07-24 VITALS — BP 103/69 | HR 99 | Temp 97.0°F | Resp 18 | Wt 157.2 lb

## 2020-07-24 DIAGNOSIS — C61 Malignant neoplasm of prostate: Secondary | ICD-10-CM

## 2020-07-24 DIAGNOSIS — C7951 Secondary malignant neoplasm of bone: Secondary | ICD-10-CM

## 2020-07-24 MED ORDER — BICALUTAMIDE 50 MG PO TABS
50.0000 mg | ORAL_TABLET | Freq: Every day | ORAL | 0 refills | Status: DC
Start: 2020-07-24 — End: 2020-08-22

## 2020-07-24 NOTE — Progress Notes (Signed)
Reason for the request:    Prostate cancer  HPI: I was asked by Dr. Jeffie Pollock to evaluate [name] for advanced prostate cancer.  He is a 64 year old man originally from [place] but works as a Pharmacist, community in [place] and travels back and forth.  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  He underwent C4, C5, C6 laminectomy and resection of epidural metastatic tumor completed by Dr. Trenton Gammon on February 20, 2019.  The final pathology confirmed the presence of prostate cancer.  His PSA at that time was 217.  He was evaluated by Dr. Jeffie Pollock and recommended proceeding with androgen deprivation therapy.  It is unclear to me if he received androgen deprivation while he is in the hospital but did not receive it as an outpatient.  He moved back to [place] and did not receive any cancer treatment.  He was hospitalized in [place] for symptoms of shortness of breath and atrial fibrillation but left the hospital AMA and flew back to [place] to pursue further care.  He was hospitalized in October 2021 with worsening shortness of breath and congestive heart failure and repeated PSA at that time was 86.26 on June 30, 2020. His most recent hospitalization between November 12 and July 19, 2020 was related to fluid overload and diastolic CHF exacerbation and bilateral pleural effusion required thoracentesis.  Since his discharge, he feels reasonably well and does not report any shortness of breath or dyspnea on exertion.  He denies any bone pain or pathological fractures.  Although he was prescribed Casodex at times he is not taking it currently.   He does not report any headaches, blurry vision, syncope or seizures. Does not report any fevers, chills or sweats.  Does not report any cough, wheezing or hemoptysis.  Does not report any chest pain, palpitation, orthopnea or  leg edema.  Does not report any nausea, vomiting or abdominal pain.  Does not report any constipation or diarrhea.  Does not report any skeletal complaints.    Does not report frequency, urgency or hematuria.  Does not report any skin rashes or lesions. Does not report any heat or cold intolerance.  Does not report any lymphadenopathy or petechiae.  Does not report any anxiety or depression.  Remaining review of systems is negative.    Past Medical History:  Diagnosis Date  . Dyspnea   . Hypothyroidism   . Prostate CA [facility])   :  Past Surgical History:  Procedure Laterality Date  . IR THORACENTESIS ASP PLEURAL SPACE W/IMG GUIDE  06/27/2020  . IR THORACENTESIS ASP PLEURAL SPACE W/IMG GUIDE  06/28/2020  . LAMINECTOMY Bilateral 02/20/2019   Procedure: CERVICAL LAMINECTOMY FOR TUMOR;  Surgeon: Earnie Larsson, MD;  Location: [facility];  Service: Neurosurgery;  Laterality: Bilateral;  CERVICAL LAMINECTOMY FOR TUMOR  . LIGAMENT REPAIR Left   :   Current Outpatient Medications:  .  apixaban (ELIQUIS) 5 MG TABS tablet, Take 1 tablet (5 mg total) by mouth 2 (two) times daily., Disp: 60 tablet, Rfl: 0 .  bicalutamide (CASODEX) 50 MG tablet, Take 1 tablet (50 mg total) by mouth daily. (Patient not taking: Reported on 07/12/2020), Disp: 30 tablet, Rfl: 0 .  digoxin (LANOXIN) 0.125 MG tablet, Take 1 tablet (0.125 mg total) by mouth daily. (Patient not taking: Reported on 07/12/2020), Disp: 30 tablet, Rfl: 0 .  diltiazem (CARDIZEM CD) 120 MG 24 hr capsule, Take 1 capsule (120 mg total) by mouth daily., Disp: 60 capsule, Rfl: 2 .  folic acid (FOLVITE) 1 MG tablet, Take 1 tablet (1 mg total) by mouth daily. (Patient not taking: Reported on 07/12/2020), Disp: 30 tablet, Rfl: 0 .  furosemide (LASIX) 40 MG tablet, Take 1 tablet (40 mg total) by mouth 2 (two) times daily., Disp: 30 tablet, Rfl: 0 .  levothyroxine (SYNTHROID) 25 MCG tablet, Take 1 tablet (25 mcg total) by mouth daily before breakfast., Disp: 30 tablet,  Rfl: 0 .  metoprolol succinate (TOPROL-XL) 100 MG 24 hr tablet, Take 1 tablet (100 mg total) by mouth daily. Take with or immediately following a meal., Disp: 60 tablet, Rfl: 0:  No Known Allergies:  Family History  Problem Relation Age of Onset  . Prostate cancer Brother   :  Social History   Socioeconomic History  . Marital status: Divorced    Spouse name: Not on file  . Number of children: Not on file  . Years of education: Not on file  . Highest education level: Not on file  Occupational History  . Not on file  Tobacco Use  . Smoking status: Never Smoker  . Smokeless tobacco: Never Used  Vaping Use  . Vaping Use: Never used  Substance and Sexual Activity  . Alcohol use: Yes  . Drug use: No  . Sexual activity: Not Currently  Other Topics Concern  . Not on file  Social History Narrative  . Not on file   Social Determinants of Health   Financial Resource Strain:   . Difficulty of Paying Living Expenses: Not on file  Food Insecurity: No Food Insecurity  . Worried About Charity fundraiser in the Last Year: Never true  . Ran Out of Food in the Last Year: Never true  Transportation Needs: No Transportation Needs  . Lack of Transportation (Medical): No  . Lack of Transportation (Non-Medical): No  Physical Activity:   . Days of Exercise per Week: Not on file  . Minutes of Exercise per Session: Not on file  Stress:   . Feeling of Stress : Not on file  Social Connections:   . Frequency of Communication with Friends and Family: Not on file  . Frequency of Social Gatherings with Friends and Family: Not on file  . Attends Religious Services: Not on file  . Active Member of Clubs or Organizations: Not on file  . Attends Archivist Meetings: Not on file  . Marital Status: Not on file  Intimate Partner Violence:   . Fear of Current or Ex-Partner: Not on file  . Emotionally Abused: Not on file  . Physically Abused: Not on file  . Sexually Abused: Not on file   :  Pertinent items are noted in HPI.  Exam: Blood pressure 103/69, pulse 99, temperature (!) 97 F (36.1 C), temperature source Tympanic, resp. rate 18, weight 157 lb 3.2 oz (71.3 kg), SpO2 100 %.  ECOG 1  General appearance: alert and cooperative appeared without distress. Head: atraumatic without any abnormalities. Eyes: conjunctivae/corneas clear. PERRL.  Sclera anicteric. Throat: lips, mucosa, and tongue normal; without oral thrush or ulcers. Resp: clear to auscultation bilaterally without rhonchi, wheezes or dullness to percussion. Cardio: regular rate and rhythm, S1, S2 normal, no murmur, click, rub or gallop GI: soft, non-tender; bowel sounds normal; no masses,  no organomegaly Skin: Skin color, texture, turgor normal. No rashes or lesions Lymph nodes: Cervical,  supraclavicular, and axillary nodes normal. Neurologic: Grossly normal without any motor, sensory or deep tendon reflexes. Musculoskeletal: No joint deformity or effusion.   DG Chest 1 View  Result Date: 07/12/2020 CLINICAL DATA:  Shortness of breath EXAM: CHEST  1 VIEW COMPARISON:  July 01, 2020.  History of prostate carcinoma FINDINGS: There is airspace consolidation in the left base region. Lungs elsewhere clear. Heart is upper normal in size with pulmonary vascularity normal. No adenopathy. Sclerotic bony metastases noted throughout the thoracic region. IMPRESSION: Airspace opacity left base consistent with pneumonia. Lungs elsewhere clear. Heart upper normal in size. Widespread bony metastases noted throughout the thoracic region. Electronically Signed   By: Lowella Grip III M.D.   On: 07/12/2020 14:24   DG Chest 1 View  Result Date: 06/28/2020 CLINICAL DATA:  Left thoracentesis. EXAM: CHEST  1 VIEW COMPARISON:  06/28/2020. FINDINGS: Mediastinum and hilar structures normal. Stable cardiomegaly. Bilateral interstitial prominence again noted with interim improvement from prior exam. Findings suggests  improving CHF. Improved left pleural effusion. Tiny right pleural effusion. No pneumothorax post thoracentesis. IMPRESSION: Findings consistent with improving CHF. Improved left pleural effusion. No pneumothorax post thoracentesis. Tiny right pleural effusion. Electronically Signed   By: Marcello Moores  Register   On: 06/28/2020 10:55   DG Chest 1 View  Result Date: 06/27/2020 CLINICAL DATA:  Post right thoracentesis. EXAM: CHEST  1 VIEW COMPARISON:  06/24/2020 FINDINGS: Interval improvement in right pleural effusion. No pneumothorax. Improved aeration right lung base Interval progression of left pleural effusion and left lower lobe atelectasis Extensive sclerotic bony changes compatible with metastatic prostate cancer. IMPRESSION: Improved right pleural effusion with no pneumothorax post thoracentesis Progression of left effusion and left lower lobe atelectasis. Bony metastatic disease. Electronically Signed   By: Franchot Gallo M.D.   On: 06/27/2020 12:06     CT CHEST W CONTRAST  Result Date: 07/13/2020 CLINICAL DATA:  Pleural effusion on chest x-ray. EXAM: CT CHEST WITH CONTRAST TECHNIQUE: Multidetector CT imaging of the chest was performed during intravenous contrast administration. CONTRAST:  56mL OMNIPAQUE IOHEXOL 300 MG/ML  SOLN COMPARISON:  06/25/2020 FINDINGS: Cardiovascular: The heart size is normal. No substantial pericardial effusion. No thoracic aortic aneurysm no large central pulmonary embolus Mediastinum/Nodes: No mediastinal lymphadenopathy. Calcified nodal tissue noted in the right hilum. There is no hilar lymphadenopathy. The esophagus has normal imaging features. There is no axillary lymphadenopathy. Lungs/Pleura: Patchy areas of central ground-glass attenuation are seen in the upper lobes, right greater than left, decreased in the interval. There is dependent bilateral lower lobe collapse/consolidation with small to moderate bilateral pleural effusions. Upper Abdomen: Heterogeneous liver  parenchyma is nonspecific but may be related to fatty deposition or cirrhosis. Calcified granulomata noted in the spleen. Musculoskeletal: Widespread sclerotic bone metastases again noted. IMPRESSION: 1. Patchy areas of central ground-glass attenuation in the upper lobes, right greater than left, decreased in the interval. Imaging features likely reflect improving infectious/inflammatory etiology. 2. Small to moderate bilateral pleural effusions with dependent bilateral lower lobe collapse/consolidation. 3. Widespread sclerotic bone metastases. 4. Heterogeneous liver parenchyma, nonspecific but may be related to fatty deposition or cirrhosis. Metastatic disease considered less likely but not excluded. Electronically Signed   By: Misty Stanley M.D.   On: 07/13/2020 09:23   CT ANGIO CHEST PE W OR WO CONTRAST  Result Date: 06/25/2020 CLINICAL DATA:  Worsening shortness of breath EXAM: CT ANGIOGRAPHY CHEST WITH CONTRAST TECHNIQUE: Multidetector CT imaging of the chest was performed using the standard protocol during bolus administration of intravenous contrast. Multiplanar  CT image reconstructions and MIPs were obtained to evaluate the vascular anatomy. CONTRAST:  160mL OMNIPAQUE IOHEXOL 350 MG/ML SOLN COMPARISON:  Radiograph same day FINDINGS: Cardiovascular: There is a optimal opacification of the pulmonary arteries. There is no central,segmental, or subsegmental filling defects within the pulmonary arteries. There is mild cardiomegaly present. No pericardial effusion or thickening. No evidence right heart strain. There is normal three-vessel brachiocephalic anatomy without proximal stenosis. The thoracic aorta is normal in appearance. Mediastinum/Nodes: No hilar, mediastinal, or axillary adenopathy. Thyroid gland, trachea, and esophagus demonstrate no significant findings. Lungs/Pleura: There is moderate to large bilateral pleural effusion with adjacent compressive atelectasis seen. Patchy rounded ground-glass  opacities are seen predominantly within the right upper lung and right middle lobe. Upper Abdomen: No acute abnormalities present in the visualized portions of the upper abdomen. There is diffuse anasarca present. Musculoskeletal: Extensive osseous sclerotic blastic lesions are seen throughout the visualized portion of the axial and appendicular skeleton. Review of the MIP images confirms the above findings. IMPRESSION: No central, segmental, or subsegmental pulmonary embolism Moderate to large bilateral pleural effusions with adjacent compressive atelectasis. Ground-glass opacities within the right upper lung and right middle lobe, likely due to infectious or inflammatory etiology. Extensive diffuse osseous metastatic disease. Electronically Signed   By: Prudencio Pair M.D.   On: 06/25/2020 03:11   MR BRAIN W WO CONTRAST  Result Date: 06/25/2020 CLINICAL DATA:  Left-sided weakness, metastatic prostate cancer EXAM: MRI HEAD WITHOUT AND WITH CONTRAST TECHNIQUE: Multiplanar, multiecho pulse sequences of the brain and surrounding structures were obtained without and with intravenous contrast. CONTRAST:  52mL GADAVIST GADOBUTROL 1 MMOL/ML IV SOLN COMPARISON:  2020 FINDINGS: Brain: There is thickening and enhancement along the posterior falx measuring 5 mm in thickness. The adjacent superior sagittal sinus is uninvolved. There is new minimal adjacent T2 FLAIR hyperintensity in the right parietal lobe that may reflect associated edema. There is no acute infarction or intracranial hemorrhage. There is no parenchymal mass or mass effect. There is no hydrocephalus or extra-axial fluid collection. Prominence of the ventricles and sulci reflects minor generalized parenchymal volume loss. Patchy foci of T2 hyperintensity in the supratentorial white matter are nonspecific but probably reflect mild chronic microvascular ischemic changes. These findings have progressed since 2020. Vascular: Major vessel flow voids at the skull  base are preserved. Skull and upper cervical spine: Abnormal marrow signal likely reflecting diffuse metastatic disease. Sinuses/Orbits: Chronic right maxillary sinusitis. Orbits are unremarkable. Other: Sella is unremarkable.  Bilateral mastoid effusions. IMPRESSION: No acute infarction. Thickening enhancement along the posterior falx cerebri with possible minimal associated parenchymal edema. Differential considerations include dural-based metastasis (more likely given normal appearance on prior study) and meningioma. Diffuse osseous metastatic disease. Electronically Signed   By: Macy Mis M.D.   On: 06/25/2020 08:24   MR CERVICAL SPINE W WO CONTRAST  Addendum Date: 07/05/2020   ADDENDUM REPORT: 07/05/2020 13:24 ADDENDUM: I was subsequently contacted by Dr. Curly Shores of neurology who directed my attention to subtle dural thickening and enhancement at the C5-C6 level. The dura measures 1-2 mm in thickness at this level. This finding may be postoperative in etiology. Trace dural-based tumor is difficult to definitively exclude. Consider contrast-enhanced MRI follow-up for surveillance. No resultant significant spinal cord mass effect at this level. This addendum was discussed with Dr. Curly Shores by telephone at approximately 1:30 p.m. on 07/05/2020. Electronically Signed   By: Kellie Simmering DO   On: 07/05/2020 13:24   Result Date: 07/05/2020 CLINICAL DATA:  Myelopathy, acute or  progressive; left arm weakness. Additional history provided: History of prostate cancer metastatic to bone, status post cervical laminectomy with tumor removal in June 2020. EXAM: MRI CERVICAL SPINE WITHOUT AND WITH CONTRAST TECHNIQUE: Multiplanar and multiecho pulse sequences of the cervical spine, to include the craniocervical junction and cervicothoracic junction, were obtained without and with intravenous contrast. CONTRAST:  50mL GADAVIST GADOBUTROL 1 MMOL/ML IV SOLN COMPARISON:  Radiographs of the cervical spine 02/20/2019. Cervical  spine MRI 02/19/2019. Cervical spine MRI 02/20/2019. FINDINGS: Alignment: Reversal of the expected cervical lordosis. No significant spondylolisthesis. Vertebrae: Vertebral body height is maintained. Diffuse metastatic disease throughout the cervical spine. Since the prior MRI of 02/20/2019, there has been interval C4, C5 and C6 laminectomies for resection of epidural metastatic tumor and microdiscectomy. Multilevel degenerative endplate irregularity. Cord: No spinal cord signal abnormality. Posterior Fossa, vertebral arteries, paraspinal tissues: No abnormality identified within included portions of the posterior fossa. Bilateral mastoid effusions. Flow voids preserved within the imaged cervical vertebral arteries. Paraspinal soft tissues within normal limits. Unchanged nonspecific 13 mm round T2 hyperintense subcutaneous lesion within the posterior right upper back (series 8, image 36). Disc levels: Unless otherwise stated, the level by level findings below have not significantly changed since prior MRIs of 02/20/2019 and 02/19/2019. Severe disc degeneration at C3-C4, C4-C5, C5-C6, C6-C7 and C7-T1. C2-C3: No significant disc herniation or stenosis. C3-C4: Disc bulge. Uncovertebral and facet hypertrophy. The disc bulge effaces the ventral thecal sac, contacting and minimally flattening the ventral spinal cord with overall mild relative spinal canal narrowing. Moderate/severe left neural foraminal narrowing. C4-C5: Interval laminectomies for resection of previous dorsal epidural tumor. Posterior disc osteophyte complex. Uncinate and facet hypertrophy. No significant spinal canal stenosis. Bilateral neural foraminal narrowing (mild right, moderate left). C5-C6: Interval laminectomies for resection of previous dorsal epidural tumor. Posterior disc osteophyte complex. Uncinate and facet hypertrophy. The disc osteophyte complex minimally effaces the ventral thecal sac without significant spinal canal stenosis.  Mild/moderate bilateral neural foraminal narrowing. C6-C7: Posterior disc osteophyte complex. Uncinate and facet hypertrophy. Mild spinal canal stenosis. Bilateral neural foraminal narrowing (mild right, moderate left). C7-T1: This level is imaged sagittally. No significant spinal canal stenosis. Mild bilateral neural foraminal narrowing. IMPRESSION: Since the prior MRI of 02/20/2019, there has been interval C4, C5 and C6 laminectomies for resection of previous dorsal epidural metastatic tumor. No evidence of recurrent epidural tumor at this site. No convincing evidence of epidural metastatic disease within the cervical spine. 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 Cervical spondylosis as outlined and having not significantly changed from the cervical spine MRI of 02/20/2020. Severe disc degeneration throughout the majority of the cervical spine. No more than mild spinal canal stenosis at any level. At C3-C4, a disc bulge contributes to mild relative spinal canal narrowing, contacting and minimally flattening the ventral spinal cord. Multilevel neural foraminal narrowing greatest on the left at C3-C4 (moderate/severe), on the left at C4-C5 (moderate) and on the left at C6-C7 (moderate). Electronically Signed: By: Kellie Simmering DO On: 07/05/2020 11:43        Assessment and Plan:    64 year old with:  1.  Advanced prostate cancer diagnosed in June 2020 with documented disease to the bone including C5 metastasis that required laminectomy.  His PSA at that time was 270.  Repeat PSA in October 2021 was 86.  The natural course of this disease was reviewed and treatment options were discussed.  He understands he has incurable malignancy and any treatment at this time would be palliative.  It appears that his disease is still castration sensitive with the unexplainable drop in his PSA between June 2020 and October 2021.  It is possible that he had a exposure to androgen  deprivation and he does not recall that or it is possible that the laminectomy developed his tumor temporarily.  The backbone to treating advanced prostate cancer would be androgen deprivation which was discussed with him today in detail.  Since he is willing to take Casodex we will initiate that and will start him on Eligard next week.  Complication associated with this combination would be fatigue, hot flashes, weight gain and sexual dysfunction.  He is agreeable to proceed at this time.  Therapy escalation with abiraterone, enzalutamide, systemic chemotherapy among others would be needed and would be beneficial to him moving forward.  We will discussed that in future visits once he has decided on where he will get his future care.  He still plans to return to [place] to continue to practice and unsure whether he wants to continue his care locally.  2.  Androgen deprivation: He will start Eligard next week and be repeated in 4 months.  3.  Bone directed therapy: I recommended calcium and vitamin D supplements and update his staging scans in the near future including a bone scan to see the extent of the disease.  After obtaining dental clearance he will be a candidate for Xgeva.  4.  Bone pain: None reported at this time.  Palliative radiation therapy to his dural metastasis as well as other areas of the spine could be implemented in the future.  5.  Prognosis and goals of care: His disease is incurable although aggressive measures are warranted given his excellent performance status.  6.  Congestive heart failure: I recommended follow-up with cardiology to manage his cardiac drugs.  7.  Follow-up: Will be in the next few months to follow his progress.   60  minutes were dedicated to this visit. The time was spent on reviewing laboratory data, imaging studies, discussing treatment options, diagnosis and answering questions regarding future plan.    A copy of this consult has been forwarded to  the requesting physician.

## 2020-07-29 NOTE — Progress Notes (Signed)
Cardiology Office Note:   Date:  08/01/2020  NAME:  Chris Morales    MRN: [id number] DOB:  19-Nov-1955   PCP:  Patient, No Pcp Per  Cardiologist:  Evalina Field, MD   Referring MD: No ref. provider found   Chief Complaint  Patient presents with  . Atrial Fibrillation   History of Present Illness:   Chris Morales is a 64 y.o. male with a hx of metastatic prostate CA, persistent Afib, pulmonary hypertension and HFpEF who presents for follow-up. Admitted to the hospital for PNA and HFpEF. Was discharged to rehab but was volume overloaded again. Diuresed down to 141 lbs.   Weights up to 167 lbs. Volume overloaded on exam today. Describes orthopnea. No overt SOB with exertion. No rapid heart beat sensation.  He is taking Lasix 40 mg twice a day.  Given his right heart failure I think torsemide would be a better option for him.  He also had a low albumin which is not helping his fluid status.  We did discuss salt restriction as well as fluid restriction in the office.  Apparently there were several items he consumes which likely are high in salt content.  He was given information on how to reduce his salt intake in the office.  We also discussed restricting his fluid intake.  He will continue this as well.  We also discussed his atrial fibrillation.  He is not bothered by this.  He did have an attempt at rhythm control in [place] with medication.  He is unaware of the name of the medication but apparently this failed.  We did discuss an attempt at cardioversion.  Now is not the time given his volume status that is rather poor.  We discussed seeing him back in 2 weeks and hopefully his volume status will improve and we can attempt a cardioversion.  Problem List 1. Metastatic prostate cancer 2. Persistent atrial fibrillation 3. Pleural effusions -exudative  -Albumin 2.8 4. 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 HFpEF  Past Medical History: Past Medical History:  Diagnosis Date  .  Dyspnea   . Hypothyroidism   . Prostate CA [facility])     Past Surgical History: Past Surgical History:  Procedure Laterality Date  . IR THORACENTESIS ASP PLEURAL SPACE W/IMG GUIDE  06/27/2020  . IR THORACENTESIS ASP PLEURAL SPACE W/IMG GUIDE  06/28/2020  . LAMINECTOMY Bilateral 02/20/2019   Procedure: CERVICAL LAMINECTOMY FOR TUMOR;  Surgeon: Earnie Larsson, MD;  Location: [facility];  Service: Neurosurgery;  Laterality: Bilateral;  CERVICAL LAMINECTOMY FOR TUMOR  . LIGAMENT REPAIR Left     Current Medications: Current Meds  Medication Sig  . apixaban (ELIQUIS) 5 MG TABS tablet Take 1 tablet (5 mg total) by mouth 2 (two) times daily.  . bicalutamide (CASODEX) 50 MG tablet Take 1 tablet (50 mg total) by mouth daily.  . digoxin (LANOXIN) 0.125 MG tablet Take 1 tablet (0.125 mg total) by mouth daily.  Marland Kitchen diltiazem (CARDIZEM CD) 120 MG 24 hr capsule Take 1 capsule (120 mg total) by mouth daily.  . folic acid (FOLVITE) 1 MG tablet Take 1 tablet (1 mg total) by mouth daily.  Marland Kitchen levothyroxine (SYNTHROID) 25 MCG tablet Take 1 tablet (25 mcg total) by mouth daily before breakfast.  . metoprolol succinate (TOPROL-XL) 100 MG 24 hr tablet Take 1 tablet (100 mg total) by mouth daily. Take with or immediately following a meal.  . [DISCONTINUED] furosemide (LASIX) 40 MG tablet Take 1 tablet (40 mg total)  by mouth 2 (two) times daily.     Allergies:    Patient has no known allergies.   Social History: Social History   Socioeconomic History  . Marital status: Divorced    Spouse name: Not on file  . Number of children: Not on file  . Years of education: Not on file  . Highest education level: Not on file  Occupational History  . Not on file  Tobacco Use  . Smoking status: Never Smoker  . Smokeless tobacco: Never Used  Vaping Use  . Vaping Use: Never used  Substance and Sexual Activity  . Alcohol use: Yes  . Drug use: No  . Sexual activity: Not Currently  Other Topics Concern  . Not on file   Social History Narrative  . Not on file   Social Determinants of Health   Financial Resource Strain:   . Difficulty of Paying Living Expenses: Not on file  Food Insecurity: No Food Insecurity  . Worried About Charity fundraiser in the Last Year: Never true  . Ran Out of Food in the Last Year: Never true  Transportation Needs: No Transportation Needs  . Lack of Transportation (Medical): No  . Lack of Transportation (Non-Medical): No  Physical Activity:   . Days of Exercise per Week: Not on file  . Minutes of Exercise per Session: Not on file  Stress:   . Feeling of Stress : Not on file  Social Connections:   . Frequency of Communication with Friends and Family: Not on file  . Frequency of Social Gatherings with Friends and Family: Not on file  . Attends Religious Services: Not on file  . Active Member of Clubs or Organizations: Not on file  . Attends Archivist Meetings: Not on file  . Marital Status: Not on file    Family History: The patient's family history includes Prostate cancer in his brother.  ROS:   All other ROS reviewed and negative. Pertinent positives noted in the HPI.     EKGs/Labs/Other Studies Reviewed:   The following studies were personally reviewed by me today:  TTE 07/12/2020 1. Left ventricular ejection fraction, by estimation, is 60 to 65%. The  left ventricle has normal function. There is mild left ventricular  hypertrophy. Left ventricular diastolic parameters are indeterminate.  2. Right ventricular systolic function is normal. The right ventricular  size is mildly enlarged. There is mildly elevated pulmonary artery  systolic pressure. The estimated right ventricular systolic pressure is  59.5 mmHg. Mildly D-shaped  interventricular septum suggests elevated RV pressure.  3. Left atrial size was moderately dilated.  4. Right atrial size was moderately dilated.  5. The mitral valve is normal in structure. Trivial mitral valve   regurgitation. No evidence of mitral stenosis.  6. The aortic valve is tricuspid. Aortic valve regurgitation is not  visualized. Mild aortic valve sclerosis is present, with no evidence of  aortic valve stenosis.  7. The inferior vena cava is dilated in size with <50% respiratory  variability, suggesting right atrial pressure of 15 mmHg.  8. Significant left pleural effusion.   Recent Labs: 07/03/2020: Magnesium 2.1 07/12/2020: B Natriuretic Peptide 271.0 07/13/2020: ALT 17; TSH 5.022 07/17/2020: Hemoglobin 9.5; Platelets 256 07/19/2020: BUN 30; Creatinine, Ser 0.86; Potassium 3.8; Sodium 139   Recent Lipid Panel    Component Value Date/Time   CHOL 132 06/25/2020 1546   TRIG 87 06/25/2020 1546   HDL 28 (L) 06/25/2020 1546   CHOLHDL 4.7 06/25/2020 1546  VLDL 17 06/25/2020 1546   LDLCALC 87 06/25/2020 1546    Physical Exam:   VS:  BP 110/68   Pulse 90   Ht 6\' 1"  (1.854 m)   Wt 167 lb (75.8 kg)   BMI 22.03 kg/m    Wt Readings from Last 3 Encounters:  08/01/20 167 lb (75.8 kg)  07/24/20 157 lb 3.2 oz (71.3 kg)  07/19/20 141 lb 6.4 oz (64.1 kg)    General: Well nourished, well developed, in no acute distress Heart: Atraumatic, normal size  Eyes: PEERLA, EOMI  Neck: Supple, JVD up to earlobes Endocrine: No thryomegaly Cardiac: Normal S1, S2; RRR; no murmurs, rubs, or gallops Lungs: Clear to auscultation bilaterally, no wheezing, rhonchi or rales  Abd: Soft, nontender, no hepatomegaly  Ext: 2+ pitting edema Musculoskeletal: No deformities, BUE and BLE strength normal and equal Skin: Warm and dry, no rashes   Neuro: Alert and oriented to person, place, time, and situation, CNII-XII grossly intact, no focal deficits  Psych: Normal mood and affect   ASSESSMENT:   Tannen Vandezande is a 64 y.o. male who presents for the following: 1. Persistent atrial fibrillation ([place])   2. Chronic diastolic heart failure (HCC)   3. Pulmonary hypertension ([place])     PLAN:   1.  Persistent atrial fibrillation (HCC) -CHADSVASc= 1 (CHF) -He had atrial fibrillation since March 2021.  He was living in [place] at the time and had his diagnosis of metastatic prostate cancer found.  He apparently underwent an attempt at restoration of sinus rhythm but this was short-lived.  He reports that he was on a medication to do this.  He has not had any attempt at sinus rhythm since then. -To me it is unclear what is causing his persistent volume overload.  He does have low albumin as well as likely some diastolic dysfunction.  He is not have a longstanding history of hypertension or diabetes though.  His A. fib may be contributing. -For now we will continue with rate control with diltiazem, metoprolol and digoxin.  He was on these in [place] as well.  He will continue his Eliquis 5 mg twice daily.  His chads vas score is 1.  I would like to continue this for now as I will see him back in 2 weeks we may pursue cardioversion.  Would be easier for him to remain on uninterrupted anticoagulation to avoid a transesophageal echocardiogram.  2. Chronic diastolic heart failure ([place]) 3. Pulmonary hypertension ([place]) -Recurrent admissions for volume overload.  He had exudative pleural effusions.  He has a low albumin of 2.8.  His effusions in the lungs were felt to be related to malignancy.  He has backup at least 20 pounds since leaving the hospital.   -He has noticeable volume overload on exam. -Given that he has pulmonary hypertension and more right-sided heart failure, I recommend we switch him to torsemide 40 mg a day for 5 days and then resume 20 mg a day.  This is much more predictable than Lasix. -He has been told his casodex causes peripheral edema.  I think this is more than that. -He has atrial fibrillation is could be contributing.  Left atrial size is normal.  CT PE study negative.  No concern for sleep apnea. -His pulmonary hypertension could be related to his pleural effusions and possible  hypoxia.  He may need to have pulmonary evaluation at some point. -For now we will continue torsemide as detailed above.  I will see him  back in 2 weeks.  Hopefully we can get enough fluid off to avoid hospitalization.  I would like to get him an attempt at restoration of sinus rhythm.  We may need to pursue amiodarone.  He is quite young but he does have metastatic prostate cancer.  His condition is treatable but not curable.  Disposition: Return in about 2 weeks (around 08/15/2020).  Medication Adjustments/Labs and Tests Ordered: Current medicines are reviewed at length with the patient today.  Concerns regarding medicines are outlined above.  No orders of the defined types were placed in this encounter.  Meds ordered this encounter  Medications  . potassium chloride SA (KLOR-CON) 20 MEQ tablet    Sig: Take 1 tablet (20 mEq total) by mouth daily.    Dispense:  90 tablet    Refill:  3  . torsemide (DEMADEX) 20 MG tablet    Sig: Take 2 tablets by mouth once a day for 5 days then take 1 tablet by mouth once a day    Dispense:  40 tablet    Refill:  0    Patient Instructions   Medication Instructions:  Your physician has recommended you make the following change in your medication:   1) Stop Lasix 2) Start Torsemide 20 mg, take 2 tablets by mouth once a day for 5 days, then take 1 tablet by mouth once a day 3) Start Potassium 20 mEq, 1 tablet by mouth once a day  *If you need a refill on your cardiac medications before your next appointment, please call your pharmacy*  Lab Work: None ordered today  Testing/Procedures: None ordered today  Follow-Up: At [facility], you and your health needs are our priority.  As part of our continuing mission to provide you with exceptional heart care, we have created designated Provider Care Teams.  These Care Teams include your primary Cardiologist (physician) and Advanced Practice Providers (APPs -  Physician Assistants and Nurse  Practitioners) who all work together to provide you with the care you need, when you need it.  Your next appointment:   2 week(s) (per Dr. Audie Box ok to double book)  The format for your next appointment:   In Person  Provider:   Eleonore Chiquito, MD   Other Instructions Check into Ensure low sodium drinks. Restrict fluid intake to 2-3 liters a day    Two Gram Sodium Diet 2000 mg  What is Sodium? Sodium is a mineral found naturally in many foods. The most significant source of sodium in the diet is table salt, which is about 40% sodium.  Processed, convenience, and preserved foods also contain a large amount of sodium.  The body needs only 500 mg of sodium daily to function,  A normal diet provides more than enough sodium even if you do not use salt.  Why Limit Sodium? A build up of sodium in the body can cause thirst, increased blood pressure, shortness of breath, and water retention.  Decreasing sodium in the diet can reduce edema and risk of heart attack or stroke associated with high blood pressure.  Keep in mind that there are many other factors involved in these health problems.  Heredity, obesity, lack of exercise, cigarette smoking, stress and what you eat all play a role.  General Guidelines:  Do not add salt at the table or in cooking.  One teaspoon of salt contains over 2 grams of sodium.  Read food labels  Avoid processed and convenience foods  Ask your dietitian before eating  any foods not dicussed in the menu planning guidelines  Consult your physician if you wish to use a salt substitute or a sodium containing medication such as antacids.  Limit milk and milk products to 16 oz (2 cups) per day.  Shopping Hints:  READ LABELS!! "Dietetic" does not necessarily mean low sodium.  Salt and other sodium ingredients are often added to foods during processing.   Menu Planning Guidelines Food Group Choose More Often Avoid  Beverages (see also the milk group All fruit  juices, low-sodium, salt-free vegetables juices, low-sodium carbonated beverages Regular vegetable or tomato juices, commercially softened water used for drinking or cooking  Breads and Cereals Enriched white, wheat, rye and pumpernickel bread, hard rolls and dinner rolls; muffins, cornbread and waffles; most dry cereals, cooked cereal without added salt; unsalted crackers and breadsticks; low sodium or homemade bread crumbs Bread, rolls and crackers with salted tops; quick breads; instant hot cereals; pancakes; commercial bread stuffing; self-rising flower and biscuit mixes; regular bread crumbs or cracker crumbs  Desserts and Sweets Desserts and sweets mad with mild should be within allowance Instant pudding mixes and cake mixes  Fats Butter or margarine; vegetable oils; unsalted salad dressings, regular salad dressings limited to 1 Tbs; light, sour and heavy cream Regular salad dressings containing bacon fat, bacon bits, and salt pork; snack dips made with instant soup mixes or processed cheese; salted nuts  Fruits Most fresh, frozen and canned fruits Fruits processed with salt or sodium-containing ingredient (some dried fruits are processed with sodium sulfites        Vegetables Fresh, frozen vegetables and low- sodium canned vegetables Regular canned vegetables, sauerkraut, pickled vegetables, and others prepared in brine; frozen vegetables in sauces; vegetables seasoned with ham, bacon or salt pork  Condiments, Sauces, Miscellaneous  Salt substitute with physician's approval; pepper, herbs, spices; vinegar, lemon or lime juice; hot pepper sauce; garlic powder, onion powder, low sodium soy sauce (1 Tbs.); low sodium condiments (ketchup, chili sauce, mustard) in limited amounts (1 tsp.) fresh ground horseradish; unsalted tortilla chips, pretzels, potato chips, popcorn, salsa (1/4 cup) Any seasoning made with salt including garlic salt, celery salt, onion salt, and seasoned salt; sea salt, rock salt,  kosher salt; meat tenderizers; monosodium glutamate; mustard, regular soy sauce, barbecue, sauce, chili sauce, teriyaki sauce, steak sauce, Worcestershire sauce, and most flavored vinegars; canned gravy and mixes; regular condiments; salted snack foods, olives, picles, relish, horseradish sauce, catsup   Food preparation: Try these seasonings Meats:    Pork Sage, onion Serve with applesauce  Chicken Poultry seasoning, thyme, parsley Serve with cranberry sauce  Lamb Curry powder, rosemary, garlic, thyme Serve with mint sauce or jelly  Veal Marjoram, basil Serve with current jelly, cranberry sauce  Beef Pepper, bay leaf Serve with dry mustard, unsalted chive butter  Fish [place] leaf, dill Serve with unsalted lemon butter, unsalted parsley butter  Vegetables:    Asparagus Lemon juice   Broccoli Lemon juice   Carrots Mustard dressing parsley, mint, nutmeg, glazed with unsalted butter and sugar   Green beans Marjoram, lemon juice, nutmeg,dill seed   Tomatoes Basil, marjoram, onion   Spice /blend for [employer]" 4 tsp ground thyme 1 tsp ground sage 3 tsp ground rosemary 4 tsp ground marjoram   Test your knowledge 1. A product that says "Salt Free" may still contain sodium. True or False 2. Garlic Powder and Hot Pepper Sauce an be used as alternative seasonings.True or False 3. Processed foods have more sodium than fresh foods.  True or False 4. Canned Vegetables have less sodium than froze True or False  WAYS TO DECREASE YOUR SODIUM INTAKE 1. Avoid the use of added salt in cooking and at the table.  Table salt (and other prepared seasonings which contain salt) is probably one of the greatest sources of sodium in the diet.  Unsalted foods can gain flavor from the sweet, sour, and butter taste sensations of herbs and spices.  Instead of using salt for seasoning, try the following seasonings with the foods listed.  Remember: how you use them to enhance natural food flavors is limited only by your  creativity... Allspice-Meat, fish, eggs, fruit, peas, red and yellow vegetables Almond Extract-Fruit baked goods Anise Seed-Sweet breads, fruit, carrots, beets, cottage cheese, cookies (tastes like licorice) Basil-Meat, fish, eggs, vegetables, rice, vegetables salads, soups, sauces Bay Leaf-Meat, fish, stews, poultry Burnet-Salad, vegetables (cucumber-like flavor) Caraway Seed-Bread, cookies, cottage cheese, meat, vegetables, cheese, rice Cardamon-Baked goods, fruit, soups Celery Powder or seed-Salads, salad dressings, sauces, meatloaf, soup, bread.Do not use  celery salt Chervil-Meats, salads, fish, eggs, vegetables, cottage cheese (parsley-like flavor) Chili Power-Meatloaf, chicken cheese, corn, eggplant, egg dishes Chives-Salads cottage cheese, egg dishes, soups, vegetables, sauces Cilantro-Salsa, casseroles Cinnamon-Baked goods, fruit, pork, lamb, chicken, carrots Cloves-Fruit, baked goods, fish, pot roast, green beans, beets, carrots Coriander-Pastry, cookies, meat, salads, cheese (lemon-orange flavor) Cumin-Meatloaf, fish,cheese, eggs, cabbage,fruit pie (caraway flavor) [employer], fruit, eggs, fish, poultry, cottage cheese, vegetables Dill Seed-Meat, cottage cheese, poultry, vegetables, fish, salads, bread Fennel Seed-Bread, cookies, apples, pork, eggs, fish, beets, cabbage, cheese, Licorice-like flavor Garlic-(buds or powder) Salads, meat, poultry, fish, bread, butter, vegetables, potatoes.Do not  use garlic salt Ginger-Fruit, vegetables, baked goods, meat, fish, poultry Horseradish Root-Meet, vegetables, butter Lemon Juice or Extract-Vegetables, fruit, tea, baked goods, fish salads Mace-Baked goods fruit, vegetables, fish, poultry (taste like nutmeg) Maple Extract-Syrups Marjoram-Meat, chicken, fish, vegetables, breads, green salads (taste like Sage) Mint-Tea, lamb, sherbet, vegetables, desserts, carrots, cabbage Mustard, Dry or Seed-Cheese, eggs, meats, vegetables,  poultry Nutmeg-Baked goods, fruit, chicken, eggs, vegetables, desserts Onion Powder-Meat, fish, poultry, vegetables, cheese, eggs, bread, rice salads (Do not use   Onion salt) Orange Extract-Desserts, baked goods Oregano-Pasta, eggs, cheese, onions, pork, lamb, fish, chicken, vegetables, green salads Paprika-Meat, fish, poultry, eggs, cheese, vegetables Parsley Flakes-Butter, vegetables, meat fish, poultry, eggs, bread, salads (certain forms may   Contain sodium Pepper-Meat fish, poultry, vegetables, eggs Peppermint Extract-Desserts, baked goods Poppy Seed-Eggs, bread, cheese, fruit dressings, baked goods, noodles, vegetables, cottage  [employer], poultry, meat, fish, cauliflower, turnips,eggs bread Saffron-Rice, bread, veal, chicken, fish, eggs Sage-Meat, fish, poultry, onions, eggplant, tomateos, pork, stews Savory-Eggs, salads, poultry, meat, rice, vegetables, soups, pork Tarragon-Meat, poultry, fish, eggs, butter, vegetables (licorice-like flavor)  Thyme-Meat, poultry, fish, eggs, vegetables, (clover-like flavor), sauces, soups Tumeric-Salads, butter, eggs, fish, rice, vegetables (saffron-like flavor) Vanilla Extract-Baked goods, candy Vinegar-Salads, vegetables, meat marinades Walnut Extract-baked goods, candy  2. Choose your Foods Wisely   The following is a list of foods to avoid which are high in sodium:  Meats-Avoid all smoked, canned, salt cured, dried and kosher meat and fish as well as Anchovies   [employer] meats:Bologna, Liverwurst, Pastrami Canned meat or fish  Marinated herring Caviar    Pepperoni Corned Beef   Pizza Dried chipped beef  Salami Frozen breaded fish or meat Salt pork Frankfurters or hot dogs  Sardines Gefilte fish   Sausage Ham (boiled ham, Proscuitto Smoked butt    spiced ham)   Spam  TV Dinners Vegetables Canned vegetables (Regular) Relish Canned mushrooms  Sauerkraut Olives    Tomato  juice Pickles  Bakery and Dessert Products Canned puddings  Cream pies Cheesecake   Decorated cakes Cookies  Beverages/Juices Tomato juice, regular  Gatorade   V-8 vegetable juice, regular  Breads and Cereals Biscuit mixes   Salted potato chips, corn chips, pretzels Bread stuffing mixes  Salted crackers and rolls Pancake and waffle mixes Self-rising flour  Seasonings Accent    Meat sauces Barbecue sauce  Meat tenderizer Catsup    Monosodium glutamate (MSG) Celery salt   Onion salt Chili sauce   Prepared mustard Garlic salt   Salt, seasoned salt, sea salt Gravy mixes   Soy sauce Horseradish   Steak sauce Ketchup   Tartar sauce Lite salt    Teriyaki sauce Marinade mixes   Worcestershire sauce  Others Baking powder   Cocoa and cocoa mixes Baking soda   Commercial casserole mixes Candy-caramels, chocolate  Dehydrated soups    Bars, fudge,nougats  Instant rice and pasta mixes Canned broth or soup  Maraschino cherries Cheese, aged and processed cheese and cheese spreads  Learning Assessment Quiz  Indicated T (for True) or F (for False) for each of the following statements:  1. _____ Fresh fruits and vegetables and unprocessed grains are generally low in sodium 2. _____ Water may contain a considerable amount of sodium, depending on the source 3. _____ You can always tell if a food is high in sodium by tasting it 4. _____ Certain laxatives my be high in sodium and should be avoided unless prescribed   by a physician or pharmacist 5. _____ Salt substitutes may be used freely by anyone on a sodium restricted diet 6. _____ Sodium is present in table salt, food additives and as a natural component of   most foods 7. _____ Table salt is approximately 90% sodium 8. _____ Limiting sodium intake may help prevent excess fluid accumulation in the body 9. _____ On a sodium-restricted diet, seasonings such as bouillon soy sauce, and    cooking wine should be used in place of table  salt 10. _____ On an ingredient list, a product which lists monosodium glutamate as the first   ingredient is an appropriate food to include on a low sodium diet  Circle the best answer(s) to the following statements (Hint: there may be more than one correct answer)  11. On a low-sodium diet, some acceptable snack items are:    A. Olives  F. Bean dip   K. Grapefruit juice    B. Salted Pretzels G. Commercial Popcorn   L. Canned peaches    C. Carrot Sticks  H. Bouillon   M. Unsalted nuts   D. [place] fries  I. Peanut butter crackers N. Salami   E. Sweet pickles J. Tomato Juice   O. Pizza  12.  Seasonings that may be used freely on a reduced - sodium diet include   A. Lemon wedges F.Monosodium glutamate K. Celery seed    B.Soysauce   G. Pepper   L. Mustard powder   C. Sea salt  H. Cooking wine  M. Onion flakes   D. Vinegar  E. Prepared horseradish N. Salsa   E. Sage   J. Worcestershire sauce  O. Chutney      Time Spent with Patient: I have spent a total of 35 minutes with patient reviewing hospital notes, telemetry, EKGs, labs and examining the patient as well as establishing an assessment and plan that  was discussed with the patient.  > 50% of time was spent in direct patient care.  Signed, Addison Naegeli. Audie Box, [facility]  [address] [place], [place] [address] [phone]  08/01/2020 9:31 AM

## 2020-08-01 ENCOUNTER — Other Ambulatory Visit: Payer: Self-pay

## 2020-08-01 ENCOUNTER — Encounter: Payer: Self-pay | Admitting: Cardiovascular Disease

## 2020-08-01 ENCOUNTER — Ambulatory Visit (INDEPENDENT_AMBULATORY_CARE_PROVIDER_SITE_OTHER): Payer: PRIVATE HEALTH INSURANCE | Admitting: Cardiovascular Disease

## 2020-08-01 VITALS — BP 110/68 | HR 90 | Ht 73.0 in | Wt 167.0 lb

## 2020-08-01 DIAGNOSIS — I4819 Other persistent atrial fibrillation: Secondary | ICD-10-CM | POA: Diagnosis not present

## 2020-08-01 DIAGNOSIS — I272 Pulmonary hypertension, unspecified: Secondary | ICD-10-CM | POA: Diagnosis not present

## 2020-08-01 DIAGNOSIS — I5032 Chronic diastolic (congestive) heart failure: Secondary | ICD-10-CM | POA: Diagnosis not present

## 2020-08-01 MED ORDER — TORSEMIDE 20 MG PO TABS: ORAL_TABLET | ORAL | 0 refills | Status: DC

## 2020-08-01 MED ORDER — POTASSIUM CHLORIDE CRYS ER 20 MEQ PO TBCR: 20.0000 meq | EXTENDED_RELEASE_TABLET | Freq: Every day | ORAL | 3 refills | Status: DC

## 2020-08-01 NOTE — Patient Instructions (Signed)
Medication Instructions:  Your physician has recommended you make the following change in your medication:   1) Stop Lasix 2) Start Torsemide 20 mg, take 2 tablets by mouth once a day for 5 days, then take 1 tablet by mouth once a day 3) Start Potassium 20 mEq, 1 tablet by mouth once a day  *If you need a refill on your cardiac medications before your next appointment, please call your pharmacy*  Lab Work: None ordered today  Testing/Procedures: None ordered today  Follow-Up: At [facility], you and your health needs are our priority.  As part of our continuing mission to provide you with exceptional heart care, we have created designated Provider Care Teams.  These Care Teams include your primary Cardiologist (physician) and Advanced Practice Providers (APPs -  Physician Assistants and Nurse Practitioners) who all work together to provide you with the care you need, when you need it.  Your next appointment:   2 week(s) (per Dr. Audie Box ok to double book)  The format for your next appointment:   In Person  Provider:   Eleonore Chiquito, MD   Other Instructions Check into Ensure low sodium drinks. Restrict fluid intake to 2-3 liters a day    Two Gram Sodium Diet 2000 mg  What is Sodium? Sodium is a mineral found naturally in many foods. The most significant source of sodium in the diet is table salt, which is about 40% sodium.  Processed, convenience, and preserved foods also contain a large amount of sodium.  The body needs only 500 mg of sodium daily to function,  A normal diet provides more than enough sodium even if you do not use salt.  Why Limit Sodium? A build up of sodium in the body can cause thirst, increased blood pressure, shortness of breath, and water retention.  Decreasing sodium in the diet can reduce edema and risk of heart attack or stroke associated with high blood pressure.  Keep in mind that there are many other factors involved in these health problems.   Heredity, obesity, lack of exercise, cigarette smoking, stress and what you eat all play a role.  General Guidelines:  Do not add salt at the table or in cooking.  One teaspoon of salt contains over 2 grams of sodium.  Read food labels  Avoid processed and convenience foods  Ask your dietitian before eating any foods not dicussed in the menu planning guidelines  Consult your physician if you wish to use a salt substitute or a sodium containing medication such as antacids.  Limit milk and milk products to 16 oz (2 cups) per day.  Shopping Hints:  READ LABELS!! "Dietetic" does not necessarily mean low sodium.  Salt and other sodium ingredients are often added to foods during processing.   Menu Planning Guidelines Food Group Choose More Often Avoid  Beverages (see also the milk group All fruit juices, low-sodium, salt-free vegetables juices, low-sodium carbonated beverages Regular vegetable or tomato juices, commercially softened water used for drinking or cooking  Breads and Cereals Enriched white, wheat, rye and pumpernickel bread, hard rolls and dinner rolls; muffins, cornbread and waffles; most dry cereals, cooked cereal without added salt; unsalted crackers and breadsticks; low sodium or homemade bread crumbs Bread, rolls and crackers with salted tops; quick breads; instant hot cereals; pancakes; commercial bread stuffing; self-rising flower and biscuit mixes; regular bread crumbs or cracker crumbs  Desserts and Sweets Desserts and sweets mad with mild should be within allowance Instant pudding mixes and cake  mixes  Fats Butter or margarine; vegetable oils; unsalted salad dressings, regular salad dressings limited to 1 Tbs; light, sour and heavy cream Regular salad dressings containing bacon fat, bacon bits, and salt pork; snack dips made with instant soup mixes or processed cheese; salted nuts  Fruits Most fresh, frozen and canned fruits Fruits processed with salt or sodium-containing  ingredient (some dried fruits are processed with sodium sulfites        Vegetables Fresh, frozen vegetables and low- sodium canned vegetables Regular canned vegetables, sauerkraut, pickled vegetables, and others prepared in brine; frozen vegetables in sauces; vegetables seasoned with ham, bacon or salt pork  Condiments, Sauces, Miscellaneous  Salt substitute with physician's approval; pepper, herbs, spices; vinegar, lemon or lime juice; hot pepper sauce; garlic powder, onion powder, low sodium soy sauce (1 Tbs.); low sodium condiments (ketchup, chili sauce, mustard) in limited amounts (1 tsp.) fresh ground horseradish; unsalted tortilla chips, pretzels, potato chips, popcorn, salsa (1/4 cup) Any seasoning made with salt including garlic salt, celery salt, onion salt, and seasoned salt; sea salt, rock salt, kosher salt; meat tenderizers; monosodium glutamate; mustard, regular soy sauce, barbecue, sauce, chili sauce, teriyaki sauce, steak sauce, Worcestershire sauce, and most flavored vinegars; canned gravy and mixes; regular condiments; salted snack foods, olives, picles, relish, horseradish sauce, catsup   Food preparation: Try these seasonings Meats:    Pork Sage, onion Serve with applesauce  Chicken Poultry seasoning, thyme, parsley Serve with cranberry sauce  Lamb Curry powder, rosemary, garlic, thyme Serve with mint sauce or jelly  Veal Marjoram, basil Serve with current jelly, cranberry sauce  Beef Pepper, bay leaf Serve with dry mustard, unsalted chive butter  Fish [place] leaf, dill Serve with unsalted lemon butter, unsalted parsley butter  Vegetables:    Asparagus Lemon juice   Broccoli Lemon juice   Carrots Mustard dressing parsley, mint, nutmeg, glazed with unsalted butter and sugar   Green beans Marjoram, lemon juice, nutmeg,dill seed   Tomatoes Basil, marjoram, onion   Spice /blend for [employer]" 4 tsp ground thyme 1 tsp ground sage 3 tsp ground rosemary 4 tsp ground marjoram    Test your knowledge 1. A product that says "Salt Free" may still contain sodium. True or False 2. Garlic Powder and Hot Pepper Sauce an be used as alternative seasonings.True or False 3. Processed foods have more sodium than fresh foods.  True or False 4. Canned Vegetables have less sodium than froze True or False  WAYS TO DECREASE YOUR SODIUM INTAKE 1. Avoid the use of added salt in cooking and at the table.  Table salt (and other prepared seasonings which contain salt) is probably one of the greatest sources of sodium in the diet.  Unsalted foods can gain flavor from the sweet, sour, and butter taste sensations of herbs and spices.  Instead of using salt for seasoning, try the following seasonings with the foods listed.  Remember: how you use them to enhance natural food flavors is limited only by your creativity... Allspice-Meat, fish, eggs, fruit, peas, red and yellow vegetables Almond Extract-Fruit baked goods Anise Seed-Sweet breads, fruit, carrots, beets, cottage cheese, cookies (tastes like licorice) Basil-Meat, fish, eggs, vegetables, rice, vegetables salads, soups, sauces Bay Leaf-Meat, fish, stews, poultry Burnet-Salad, vegetables (cucumber-like flavor) Caraway Seed-Bread, cookies, cottage cheese, meat, vegetables, cheese, rice Cardamon-Baked goods, fruit, soups Celery Powder or seed-Salads, salad dressings, sauces, meatloaf, soup, bread.Do not use  celery salt Chervil-Meats, salads, fish, eggs, vegetables, cottage cheese (parsley-like flavor) Chili Power-Meatloaf, chicken cheese, corn, eggplant,  egg dishes Chives-Salads cottage cheese, egg dishes, soups, vegetables, sauces Cilantro-Salsa, casseroles Cinnamon-Baked goods, fruit, pork, lamb, chicken, carrots Cloves-Fruit, baked goods, fish, pot roast, green beans, beets, carrots Coriander-Pastry, cookies, meat, salads, cheese (lemon-orange flavor) Cumin-Meatloaf, fish,cheese, eggs, cabbage,fruit pie (caraway flavor) [employer], fruit, eggs, fish, poultry, cottage cheese, vegetables Dill Seed-Meat, cottage cheese, poultry, vegetables, fish, salads, bread Fennel Seed-Bread, cookies, apples, pork, eggs, fish, beets, cabbage, cheese, Licorice-like flavor Garlic-(buds or powder) Salads, meat, poultry, fish, bread, butter, vegetables, potatoes.Do not  use garlic salt Ginger-Fruit, vegetables, baked goods, meat, fish, poultry Horseradish Root-Meet, vegetables, butter Lemon Juice or Extract-Vegetables, fruit, tea, baked goods, fish salads Mace-Baked goods fruit, vegetables, fish, poultry (taste like nutmeg) Maple Extract-Syrups Marjoram-Meat, chicken, fish, vegetables, breads, green salads (taste like Sage) Mint-Tea, lamb, sherbet, vegetables, desserts, carrots, cabbage Mustard, Dry or Seed-Cheese, eggs, meats, vegetables, poultry Nutmeg-Baked goods, fruit, chicken, eggs, vegetables, desserts Onion Powder-Meat, fish, poultry, vegetables, cheese, eggs, bread, rice salads (Do not use   Onion salt) Orange Extract-Desserts, baked goods Oregano-Pasta, eggs, cheese, onions, pork, lamb, fish, chicken, vegetables, green salads Paprika-Meat, fish, poultry, eggs, cheese, vegetables Parsley Flakes-Butter, vegetables, meat fish, poultry, eggs, bread, salads (certain forms may   Contain sodium Pepper-Meat fish, poultry, vegetables, eggs Peppermint Extract-Desserts, baked goods Poppy Seed-Eggs, bread, cheese, fruit dressings, baked goods, noodles, vegetables, cottage  [employer], poultry, meat, fish, cauliflower, turnips,eggs bread Saffron-Rice, bread, veal, chicken, fish, eggs Sage-Meat, fish, poultry, onions, eggplant, tomateos, pork, stews Savory-Eggs, salads, poultry, meat, rice, vegetables, soups, pork Tarragon-Meat, poultry, fish, eggs, butter, vegetables (licorice-like flavor)  Thyme-Meat, poultry, fish, eggs, vegetables, (clover-like flavor), sauces,  soups Tumeric-Salads, butter, eggs, fish, rice, vegetables (saffron-like flavor) Vanilla Extract-Baked goods, candy Vinegar-Salads, vegetables, meat marinades Walnut Extract-baked goods, candy  2. Choose your Foods Wisely   The following is a list of foods to avoid which are high in sodium:  Meats-Avoid all smoked, canned, salt cured, dried and kosher meat and fish as well as Anchovies   [employer] meats:Bologna, Liverwurst, Pastrami Canned meat or fish  Marinated herring Caviar    Pepperoni Corned Beef   Pizza Dried chipped beef  Salami Frozen breaded fish or meat Salt pork Frankfurters or hot dogs  Sardines Gefilte fish   Sausage Ham (boiled ham, Proscuitto Smoked butt    spiced ham)   Spam      TV Dinners Vegetables Canned vegetables (Regular) Relish Canned mushrooms  Sauerkraut Olives    Tomato juice Pickles  Bakery and Dessert Products Canned puddings  Cream pies Cheesecake   Decorated cakes Cookies  Beverages/Juices Tomato juice, regular  Gatorade   V-8 vegetable juice, regular  Breads and Cereals Biscuit mixes   Salted potato chips, corn chips, pretzels Bread stuffing mixes  Salted crackers and rolls Pancake and waffle mixes Self-rising flour  Seasonings Accent    Meat sauces Barbecue sauce  Meat tenderizer Catsup    Monosodium glutamate (MSG) Celery salt   Onion salt Chili sauce   Prepared mustard Garlic salt   Salt, seasoned salt, sea salt Gravy mixes   Soy sauce Horseradish   Steak sauce Ketchup   Tartar sauce Lite salt    Teriyaki sauce Marinade mixes   Worcestershire sauce  Others Baking powder   Cocoa and cocoa mixes Baking soda   Commercial casserole mixes Candy-caramels, chocolate  Dehydrated soups    Bars, fudge,nougats  Instant rice and pasta mixes Canned broth or soup  Maraschino cherries Cheese, aged and processed cheese and  cheese spreads  Learning Assessment Quiz  Indicated T (for True) or F (for False) for each of the  following statements:  1. _____ Fresh fruits and vegetables and unprocessed grains are generally low in sodium 2. _____ Water may contain a considerable amount of sodium, depending on the source 3. _____ You can always tell if a food is high in sodium by tasting it 4. _____ Certain laxatives my be high in sodium and should be avoided unless prescribed   by a physician or pharmacist 5. _____ Salt substitutes may be used freely by anyone on a sodium restricted diet 6. _____ Sodium is present in table salt, food additives and as a natural component of   most foods 7. _____ Table salt is approximately 90% sodium 8. _____ Limiting sodium intake may help prevent excess fluid accumulation in the body 9. _____ On a sodium-restricted diet, seasonings such as bouillon soy sauce, and    cooking wine should be used in place of table salt 10. _____ On an ingredient list, a product which lists monosodium glutamate as the first   ingredient is an appropriate food to include on a low sodium diet  Circle the best answer(s) to the following statements (Hint: there may be more than one correct answer)  11. On a low-sodium diet, some acceptable snack items are:    A. Olives  F. Bean dip   K. Grapefruit juice    B. Salted Pretzels G. Commercial Popcorn   L. Canned peaches    C. Carrot Sticks  H. Bouillon   M. Unsalted nuts   D. [place] fries  I. Peanut butter crackers N. Salami   E. Sweet pickles J. Tomato Juice   O. Pizza  12.  Seasonings that may be used freely on a reduced - sodium diet include   A. Lemon wedges F.Monosodium glutamate K. Celery seed    B.Soysauce   G. Pepper   L. Mustard powder   C. Sea salt  H. Cooking wine  M. Onion flakes   D. Vinegar  E. Prepared horseradish N. Salsa   E. Sage   J. Worcestershire sauce  O. Chutney

## 2020-08-12 ENCOUNTER — Ambulatory Visit: Payer: PRIVATE HEALTH INSURANCE | Admitting: Cardiology

## 2020-08-12 ENCOUNTER — Ambulatory Visit: Payer: PRIVATE HEALTH INSURANCE | Attending: Internal Medicine | Admitting: Physical Therapy

## 2020-08-15 ENCOUNTER — Other Ambulatory Visit: Payer: Self-pay | Admitting: Oncology

## 2020-08-20 NOTE — Progress Notes (Signed)
Cardiology Office Note:   Date:  08/22/2020  NAME:  Chris Morales    MRN: [id number] DOB:  01/22/1956   PCP:  Patient, No Pcp Per  Cardiologist:  Evalina Field, MD   Referring MD: No ref. provider found   Chief Complaint  Patient presents with  . Congestive Heart Failure   History of Present Illness:   Chris Morales is a 64 y.o. male with a hx of metastatic prostate cancer, persistent Afib, pHTN/RV failure who presents for follow-up. Extensive admission for pneumonia and volume overload. Seen 08/01/2020 with volume overload and diuretics changes to torsemide.  He presents today still volume overloaded.  Weight is up to 174 pounds from 167 pounds.  His dry weight when he left the hospital roughly 6 weeks ago was 141 pounds.  He is gone back to [place] to see patients.  He is still working as a Pharmacist, community.  Appears the diet was not that well controlled.  He is also short of breath with exertion.  Symptoms appear to not be getting better.  He denies any chest pain or palpitations.  Heart rate is 54.  Grossly volume overloaded on exam today.  We did discuss direct admission versus trying p.o. medications.  He reports giving Christmas is tomorrow he would like to try p.o. medications for a few days.  If no improvement over the weekend he will present to the emergency room.  I will plan to see him back in 2 weeks we could directly admit him at that time however if he does not get any better he will need to be seen in the emergency room.  I suspect he will need to be admitted sooner than later.  We also discussed his atrial fibrillation.  He is back in sinus rhythm today.  This is reassuring.  Blood pressure a bit low.  He can stop his digoxin and diltiazem.  Will continue on metoprolol.   Problem List 1. Metastatic prostate cancer 2. Persistent atrial fibrillation -CHADSVASC=1 (CHF) -NSR 08/22/2020 3. Pleural effusions -exudative  -Albumin 2.8 4. 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  HFpEF/RV failure  Past Medical History: Past Medical History:  Diagnosis Date  . Dyspnea   . Hypothyroidism   . Prostate CA [facility])     Past Surgical History: Past Surgical History:  Procedure Laterality Date  . IR THORACENTESIS ASP PLEURAL SPACE W/IMG GUIDE  06/27/2020  . IR THORACENTESIS ASP PLEURAL SPACE W/IMG GUIDE  06/28/2020  . LAMINECTOMY Bilateral 02/20/2019   Procedure: CERVICAL LAMINECTOMY FOR TUMOR;  Surgeon: Earnie Larsson, MD;  Location: [facility];  Service: Neurosurgery;  Laterality: Bilateral;  CERVICAL LAMINECTOMY FOR TUMOR  . LIGAMENT REPAIR Left     Current Medications: Current Meds  Medication Sig  . bicalutamide (CASODEX) 50 MG tablet Take 1 tablet (50 mg total) by mouth daily.  . folic acid (FOLVITE) 1 MG tablet Take 1 tablet (1 mg total) by mouth daily.  Marland Kitchen levothyroxine (SYNTHROID) 25 MCG tablet Take 1 tablet (25 mcg total) by mouth daily before breakfast.  . [DISCONTINUED] apixaban (ELIQUIS) 5 MG TABS tablet Take 1 tablet (5 mg total) by mouth 2 (two) times daily.  . [DISCONTINUED] digoxin (LANOXIN) 0.125 MG tablet Take 1 tablet (0.125 mg total) by mouth daily.  . [DISCONTINUED] diltiazem (CARDIZEM CD) 120 MG 24 hr capsule Take 1 capsule (120 mg total) by mouth daily.  . [DISCONTINUED] metoprolol succinate (TOPROL-XL) 100 MG 24 hr tablet Take 1 tablet (100 mg total) by mouth  daily. Take with or immediately following a meal.  . [DISCONTINUED] potassium chloride SA (KLOR-CON) 20 MEQ tablet Take 1 tablet (20 mEq total) by mouth daily.  . [DISCONTINUED] torsemide (DEMADEX) 20 MG tablet Take 2 tablets by mouth once a day for 5 days then take 1 tablet by mouth once a day     Allergies:    Patient has no known allergies.   Social History: Social History   Socioeconomic History  . Marital status: Divorced    Spouse name: Not on file  . Number of children: Not on file  . Years of education: Not on file  . Highest education level: Not on file  Occupational History  .  Not on file  Tobacco Use  . Smoking status: Never Smoker  . Smokeless tobacco: Never Used  Vaping Use  . Vaping Use: Never used  Substance and Sexual Activity  . Alcohol use: Yes  . Drug use: No  . Sexual activity: Not Currently  Other Topics Concern  . Not on file  Social History Narrative  . Not on file   Social Determinants of Health   Financial Resource Strain: Not on file  Food Insecurity: No Food Insecurity  . Worried About Charity fundraiser in the Last Year: Never true  . Ran Out of Food in the Last Year: Never true  Transportation Needs: No Transportation Needs  . Lack of Transportation (Medical): No  . Lack of Transportation (Non-Medical): No  Physical Activity: Not on file  Stress: Not on file  Social Connections: Not on file     Family History: The patient's family history includes Prostate cancer in his brother.  ROS:   All other ROS reviewed and negative. Pertinent positives noted in the HPI.     EKGs/Labs/Other Studies Reviewed:   The following studies were personally reviewed by me today:  EKG:  EKG is ordered today.  The ekg ordered today demonstrates normal sinus rhythm, heart rate 51, nonspecific ST-T changes, no evidence of prior infarction, and was personally reviewed by me.   TTE 07/12/2020 1. Left ventricular ejection fraction, by estimation, is 60 to 65%. The  left ventricle has normal function. There is mild left ventricular  hypertrophy. Left ventricular diastolic parameters are indeterminate.  2. Right ventricular systolic function is normal. The right ventricular  size is mildly enlarged. There is mildly elevated pulmonary artery  systolic pressure. The estimated right ventricular systolic pressure is  24.4 mmHg. Mildly D-shaped  interventricular septum suggests elevated RV pressure.  3. Left atrial size was moderately dilated.  4. Right atrial size was moderately dilated.  5. The mitral valve is normal in structure. Trivial mitral  valve  regurgitation. No evidence of mitral stenosis.  6. The aortic valve is tricuspid. Aortic valve regurgitation is not  visualized. Mild aortic valve sclerosis is present, with no evidence of  aortic valve stenosis.  7. The inferior vena cava is dilated in size with <50% respiratory  variability, suggesting right atrial pressure of 15 mmHg.  8. Significant left pleural effusion.   Recent Labs: 07/03/2020: Magnesium 2.1 07/12/2020: B Natriuretic Peptide 271.0 07/13/2020: ALT 17; TSH 5.022 07/17/2020: Hemoglobin 9.5; Platelets 256 07/19/2020: BUN 30; Creatinine, Ser 0.86; Potassium 3.8; Sodium 139   Recent Lipid Panel    Component Value Date/Time   CHOL 132 06/25/2020 1546   TRIG 87 06/25/2020 1546   HDL 28 (L) 06/25/2020 1546   CHOLHDL 4.7 06/25/2020 1546   VLDL 17 06/25/2020 1546  [place] 87 06/25/2020 1546    Physical Exam:   VS:  BP (!) 102/53   Pulse (!) 54   Ht 6\' 1"  (1.854 m)   Wt 174 lb 6.4 oz (79.1 kg)   SpO2 100%   BMI 23.01 kg/m    Wt Readings from Last 3 Encounters:  08/22/20 174 lb 6.4 oz (79.1 kg)  08/01/20 167 lb (75.8 kg)  07/24/20 157 lb 3.2 oz (71.3 kg)    General: Well nourished, well developed, in no acute distress Head: Atraumatic, normal size  Eyes: PEERLA, EOMI  Neck: Supple, no JVD Endocrine: No thryomegaly Cardiac: Normal S1, S2; RRR; no murmurs, rubs, or gallops Lungs: Clear to auscultation bilaterally, no wheezing, rhonchi or rales  Abd: Soft, nontender, no hepatomegaly  Ext: No edema, pulses 2+ Musculoskeletal: No deformities, BUE and BLE strength normal and equal Skin: Warm and dry, no rashes   Neuro: Alert and oriented to person, place, time, and situation, CNII-XII grossly intact, no focal deficits  Psych: Normal mood and affect   ASSESSMENT:   Chris Morales is a 64 y.o. male who presents for the following: 1. Persistent atrial fibrillation ([place])   2. Chronic diastolic heart failure (HCC)   3. Pulmonary hypertension ([place])      PLAN:   1. Persistent atrial fibrillation (HCC) -CHADSVASC=1 (CHF).  Diagnosed with A. fib in March 2021.  Was living in [place] at the time.  He reports he did have an attempt at restoration of sinus rhythm but this was unsuccessful.  I do not have the details of this. -Today's EKG shows he is in sinus rhythm.  Sinus bradycardia heart rate 51.  We will stop his digoxin and his diltiazem.  He will continue metoprolol.  When I see him back in 2 weeks we can discuss heart monitor.  For now we will just continue his Eliquis.  2. Chronic diastolic heart failure (HCC) 3. Pulmonary hypertension (HCC) -Grossly volume overloaded.  Weights continue to climb.  Elevated JVD.  2+ pitting edema up to the thighs.  His weight is 174 pounds.  He left the hospital at 141 pounds.  He is nearly 33 pounds overweight.  Did cheat on the diet a little bit.  Has been out of torsemide for 1 to 2 days.  Will restart 40 mg a day.  He will take 20 mEq of potassium with his torsemide.  We did discuss direct admission today versus seeing how he does over the weekend.  He would like to get back on 40 mg a day and see how he does.  If things not improve his shortness of breath continued to worsen he will present to the emergency room over the weekend.  If things do improve he may be able to hold off and I can see him back in 2 weeks we can decide if he needs to be directly admitted.  Disposition: Return in about 2 weeks (around 09/05/2020).  Medication Adjustments/Labs and Tests Ordered: Current medicines are reviewed at length with the patient today.  Concerns regarding medicines are outlined above.  No orders of the defined types were placed in this encounter.  Meds ordered this encounter  Medications  . torsemide (DEMADEX) 20 MG tablet    Sig: Take 2 tablets by mouth once a day for 5 days then take 1 tablet by mouth once a day    Dispense:  40 tablet    Refill:  0  . potassium chloride SA (KLOR-CON) 20 MEQ tablet  Sig: Take 1 tablet (20 mEq total) by mouth daily.    Dispense:  90 tablet    Refill:  3  . metoprolol succinate (TOPROL-XL) 100 MG 24 hr tablet    Sig: Take 1 tablet (100 mg total) by mouth daily. Take with or immediately following a meal.    Dispense:  60 tablet    Refill:  0  . DISCONTD: diltiazem (CARDIZEM CD) 120 MG 24 hr capsule    Sig: Take 1 capsule (120 mg total) by mouth daily.    Dispense:  60 capsule    Refill:  2  . apixaban (ELIQUIS) 5 MG TABS tablet    Sig: Take 1 tablet (5 mg total) by mouth 2 (two) times daily.    Dispense:  60 tablet    Refill:  0  . DISCONTD: digoxin (LANOXIN) 0.125 MG tablet    Sig: Take 1 tablet (0.125 mg total) by mouth daily.    Dispense:  30 tablet    Refill:  0    Patient Instructions  Medication Instructions:  Stop Digoxin Stop Diltiazem   *If you need a refill on your cardiac medications before your next appointment, please call your pharmacy*   Follow-Up: At [facility], you and your health needs are our priority.  As part of our continuing mission to provide you with exceptional heart care, we have created designated Provider Care Teams.  These Care Teams include your primary Cardiologist (physician) and Advanced Practice Providers (APPs -  Physician Assistants and Nurse Practitioners) who all work together to provide you with the care you need, when you need it.  We recommend signing up for the patient portal called "MyChart".  Sign up information is provided on this After Visit Summary.  MyChart is used to connect with patients for Virtual Visits (Telemedicine).  Patients are able to view lab/test results, encounter notes, upcoming appointments, etc.  Non-urgent messages can be sent to your provider as well.   To learn more about what you can do with MyChart, go to [email or web address].    Your next appointment:   2 weeks  The format for your next appointment:   In Person  Provider:   Eleonore Chiquito, MD       Time  Spent with Patient: I have spent a total of 25 minutes with patient reviewing hospital notes, telemetry, EKGs, labs and examining the patient as well as establishing an assessment and plan that was discussed with the patient.  > 50% of time was spent in direct patient care.  Signed, Addison Naegeli. Audie Box, [place]  [address] [place],  [address] [phone]  08/22/2020 11:31 AM

## 2020-08-22 ENCOUNTER — Other Ambulatory Visit: Payer: Self-pay

## 2020-08-22 ENCOUNTER — Ambulatory Visit (INDEPENDENT_AMBULATORY_CARE_PROVIDER_SITE_OTHER): Payer: PRIVATE HEALTH INSURANCE | Admitting: Cardiovascular Disease

## 2020-08-22 ENCOUNTER — Encounter: Payer: Self-pay | Admitting: Cardiovascular Disease

## 2020-08-22 ENCOUNTER — Other Ambulatory Visit: Payer: Self-pay | Admitting: Oncology

## 2020-08-22 VITALS — BP 102/53 | HR 54 | Ht 73.0 in | Wt 174.4 lb

## 2020-08-22 DIAGNOSIS — I4819 Other persistent atrial fibrillation: Secondary | ICD-10-CM | POA: Diagnosis not present

## 2020-08-22 DIAGNOSIS — I5032 Chronic diastolic (congestive) heart failure: Secondary | ICD-10-CM | POA: Diagnosis not present

## 2020-08-22 DIAGNOSIS — I272 Pulmonary hypertension, unspecified: Secondary | ICD-10-CM

## 2020-08-22 MED ORDER — DIGOXIN 125 MCG PO TABS
0.1250 mg | ORAL_TABLET | Freq: Every day | ORAL | 0 refills | Status: DC
Start: 2020-08-22 — End: 2020-08-22

## 2020-08-22 MED ORDER — POTASSIUM CHLORIDE CRYS ER 20 MEQ PO TBCR
20.0000 meq | EXTENDED_RELEASE_TABLET | Freq: Every day | ORAL | 3 refills | Status: DC
Start: 2020-08-22 — End: 2020-09-05

## 2020-08-22 MED ORDER — DILTIAZEM HCL ER COATED BEADS 120 MG PO CP24
120.0000 mg | ORAL_CAPSULE | Freq: Every day | ORAL | 2 refills | Status: DC
Start: 2020-08-22 — End: 2020-08-22

## 2020-08-22 MED ORDER — TORSEMIDE 20 MG PO TABS
ORAL_TABLET | ORAL | 0 refills | Status: DC
Start: 2020-08-22 — End: 2020-08-26

## 2020-08-22 MED ORDER — APIXABAN 5 MG PO TABS
5.0000 mg | ORAL_TABLET | Freq: Two times a day (BID) | ORAL | 0 refills | Status: DC
Start: 2020-08-22 — End: 2020-09-05

## 2020-08-22 MED ORDER — METOPROLOL SUCCINATE ER 100 MG PO TB24
100.0000 mg | ORAL_TABLET | Freq: Every day | ORAL | 0 refills | Status: DC
Start: 2020-08-22 — End: 2020-09-05

## 2020-08-22 NOTE — Patient Instructions (Addendum)
Medication Instructions:  Stop Digoxin Stop Diltiazem   *If you need a refill on your cardiac medications before your next appointment, please call your pharmacy*   Follow-Up: At [facility], you and your health needs are our priority.  As part of our continuing mission to provide you with exceptional heart care, we have created designated Provider Care Teams.  These Care Teams include your primary Cardiologist (physician) and Advanced Practice Providers (APPs -  Physician Assistants and Nurse Practitioners) who all work together to provide you with the care you need, when you need it.  We recommend signing up for the patient portal called "MyChart".  Sign up information is provided on this After Visit Summary.  MyChart is used to connect with patients for Virtual Visits (Telemedicine).  Patients are able to view lab/test results, encounter notes, upcoming appointments, etc.  Non-urgent messages can be sent to your provider as well.   To learn more about what you can do with MyChart, go to [email or web address].    Your next appointment:   2 weeks  The format for your next appointment:   In Person  Provider:   Eleonore Chiquito, MD

## 2020-08-23 ENCOUNTER — Other Ambulatory Visit: Payer: Self-pay | Admitting: Cardiovascular Disease

## 2020-09-03 ENCOUNTER — Telehealth: Payer: Self-pay | Admitting: Cardiovascular Disease

## 2020-09-03 NOTE — Telephone Encounter (Signed)
*  STAT* If patient is at the pharmacy, call can be transferred to refill team.   1. Which medications need to be refilled? (please list name of each medication and dose if known) levothyroxine (SYNTHROID) 25 MCG tablet  2. Which pharmacy/location (including street and city if local pharmacy) is medication to be sent to? [id number] - [place], [place] - [address]  3. Do they need a 30 day or 90 day supply? 30 day   Patient is out of medication

## 2020-09-04 NOTE — Progress Notes (Signed)
Cardiology Office Note:   Date:  09/05/2020  NAME:  Chris Morales    MRN: [id number] DOB:  [birth year]   PCP:  Patient, No Pcp Per  Cardiologist:  Evalina Field, MD   Referring MD: No ref. provider found   Chief Complaint  Patient presents with  . Follow-up    2 weeks.   History of Present Illness:   Chris Morales is a 65 y.o. male with a hx of metastatic prostate CA, paroxysmal Afib (NSR 08/22/2020), pHTN, HFpEF who presents for follow-up. Seen 08/22/2020 and was back in NSR. Weights had increased but had been out of torsemide. Weight is down to 162 pounds.  He was up to 174 at her last visit.  He seems to have cut salt consumption and his weights are improved.  He still has probably 10 to 15 pounds of fluid on him.  I have recommended we check his kidney panel today and then to increase his torsemide again for another 5 days.  We also discussed exercising 150 minutes/week of moderate to vigorous physical activity.  He will work on this.  He is maintaining normal sinus rhythm.  Heart rate in the 40s.  He denies any chest pain or shortness of breath.  He still has some lower extremity edema.  He also endorses symptoms of orthopnea.  I think he just needs a bit more diuresis.  Problem List 1. Metastatic prostate cancer 2. Paroxysmal atrial fibrillation -CHADSVASC=1 (CHF) -NSR 08/22/2020 3. Pleural effusions -exudative -Albumin 2.8 4. 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 HFpEF/RV failure  Past Medical History: Past Medical History:  Diagnosis Date  . Arrhythmia   . Dyspnea   . Hypothyroidism   . Prostate CA [facility])     Past Surgical History: Past Surgical History:  Procedure Laterality Date  . IR THORACENTESIS ASP PLEURAL SPACE W/IMG GUIDE  06/27/2020  . IR THORACENTESIS ASP PLEURAL SPACE W/IMG GUIDE  06/28/2020  . LAMINECTOMY Bilateral 02/20/2019   Procedure: CERVICAL LAMINECTOMY FOR TUMOR;  Surgeon: Earnie Larsson, MD;  Location: [place];  Service: Neurosurgery;   Laterality: Bilateral;  CERVICAL LAMINECTOMY FOR TUMOR  . LIGAMENT REPAIR Left     Current Medications: Current Meds  Medication Sig  . bicalutamide (CASODEX) 50 MG tablet TAKE 1 TABLET BY MOUTH EVERY DAY  . folic acid (FOLVITE) 1 MG tablet Take 1 tablet (1 mg total) by mouth daily.  Marland Kitchen levothyroxine (SYNTHROID) 25 MCG tablet Take 1 tablet (25 mcg total) by mouth daily before breakfast.  . [DISCONTINUED] apixaban (ELIQUIS) 5 MG TABS tablet Take 1 tablet (5 mg total) by mouth 2 (two) times daily.  . [DISCONTINUED] metoprolol succinate (TOPROL-XL) 100 MG 24 hr tablet Take 1 tablet (100 mg total) by mouth daily. Take with or immediately following a meal.  . [DISCONTINUED] potassium chloride SA (KLOR-CON) 20 MEQ tablet Take 1 tablet (20 mEq total) by mouth daily.  . [DISCONTINUED] torsemide (DEMADEX) 20 MG tablet TAKE 2 TABLETS BY MOUTH ONCE A DAY FOR 5 DAYS THEN TAKE 1 TABLET BY MOUTH ONCE A DAY (Patient taking differently: Take 20 mg by mouth daily.)     Allergies:    Patient has no known allergies.   Social History: Social History   Socioeconomic History  . Marital status: Divorced    Spouse name: Not on file  . Number of children: Not on file  . Years of education: Not on file  . Highest education level: Not on file  Occupational History  . Not  on file  Tobacco Use  . Smoking status: Never Smoker  . Smokeless tobacco: Never Used  Vaping Use  . Vaping Use: Never used  Substance and Sexual Activity  . Alcohol use: Yes  . Drug use: No  . Sexual activity: Not Currently  Other Topics Concern  . Not on file  Social History Narrative  . Not on file   Social Determinants of Health   Financial Resource Strain: Not on file  Food Insecurity: No Food Insecurity  . Worried About Charity fundraiser in the Last Year: Never true  . Ran Out of Food in the Last Year: Never true  Transportation Needs: No Transportation Needs  . Lack of Transportation (Medical): No  . Lack of  Transportation (Non-Medical): No  Physical Activity: Not on file  Stress: Not on file  Social Connections: Not on file     Family History: The patient's family history includes Prostate cancer in his brother.  ROS:   All other ROS reviewed and negative. Pertinent positives noted in the HPI.     EKGs/Labs/Other Studies Reviewed:   The following studies were personally reviewed by me today:  TTE 07/12/2020 1. Left ventricular ejection fraction, by estimation, is 60 to 65%. The  left ventricle has normal function. There is mild left ventricular  hypertrophy. Left ventricular diastolic parameters are indeterminate.  2. Right ventricular systolic function is normal. The right ventricular  size is mildly enlarged. There is mildly elevated pulmonary artery  systolic pressure. The estimated right ventricular systolic pressure is  [id number] mmHg. Mildly D-shaped  interventricular septum suggests elevated RV pressure.  3. Left atrial size was moderately dilated.  4. Right atrial size was moderately dilated.  5. The mitral valve is normal in structure. Trivial mitral valve  regurgitation. No evidence of mitral stenosis.  6. The aortic valve is tricuspid. Aortic valve regurgitation is not  visualized. Mild aortic valve sclerosis is present, with no evidence of  aortic valve stenosis.  7. The inferior vena cava is dilated in size with <50% respiratory  variability, suggesting right atrial pressure of 15 mmHg.  8. Significant left pleural effusion.   Recent Labs: 07/03/2020: Magnesium 2.1 07/12/2020: B Natriuretic Peptide 271.0 07/13/2020: ALT 17; TSH 5.022 07/17/2020: Hemoglobin 9.5; Platelets 256 07/19/2020: BUN 30; Creatinine, Ser 0.86; Potassium 3.8; Sodium 139   Recent Lipid Panel    Component Value Date/Time   CHOL 132 06/25/2020 1546   TRIG 87 06/25/2020 1546   HDL 28 (L) 06/25/2020 1546   CHOLHDL 4.7 06/25/2020 1546   VLDL 17 06/25/2020 1546   LDLCALC 87 06/25/2020 1546     Physical Exam:   VS:  BP (!) 108/50 (BP Location: Left Arm, Patient Position: Sitting, Cuff Size: Normal)   Pulse (!) 46   Ht 6\' 1"  (1.854 m)   Wt 162 lb (73.5 kg)   BMI 21.37 kg/m    Wt Readings from Last 3 Encounters:  09/05/20 162 lb (73.5 kg)  08/22/20 174 lb 6.4 oz (79.1 kg)  08/01/20 167 lb (75.8 kg)    General: Well nourished, well developed, in no acute distress Head: Atraumatic, normal size  Eyes: PEERLA, EOMI  Neck: Supple, JVD 10 to 12 cm of water Endocrine: No thryomegaly Cardiac: Normal S1, S2; RRR; no murmurs, rubs, or gallops Lungs: Clear to auscultation bilaterally, no wheezing, rhonchi or rales  Abd: Soft, nontender, no hepatomegaly  Ext: 1+ pitting edema Musculoskeletal: No deformities, BUE and BLE strength normal and equal Skin: Warm  and dry, no rashes   Neuro: Alert and oriented to person, place, time, and situation, CNII-XII grossly intact, no focal deficits  Psych: Normal mood and affect   ASSESSMENT:   Chris Morales is a 65 y.o. male who presents for the following: 1. Paroxysmal atrial fibrillation (HCC)   2. Chronic diastolic heart failure (HCC)   3. Pulmonary hypertension (HCC)     PLAN:   1. Paroxysmal atrial fibrillation (HCC) -CHADSVASC=1 (CHF) -NSR 08/22/2020 -He is maintaining sinus rhythm.  We will continue Eliquis for now.  We may consider stopping it given his chads Vascor of 1.  However, he will have a chads Vascor of 2 when he turns 65 in September.  We will continue for now.  No bleeding.  2. Chronic diastolic heart failure (HCC) -Weight is down to 162 pounds from 174 2 weeks ago.  He is working on his diet and salt consumption.  We will continue with another round of torsemide 40 mg once a day for 5 days and then resume 20 mg a day.  I think he will get another 10 to 12 pounds off with this next round of diuresis.  He has avoided a hospitalization.  He will also continue with potassium supplementation. -We will check his kidney panel  today as well to make sure potassium and electrolytes are okay.  3. Pulmonary hypertension (HCC) -Related diastolic heart failure and likely from chronic pleural effusions.  Seems to be doing well.  4.  Hypothyroidism -He had an elevated TSH in the hospital.  He has been on 25 mcg of Synthroid.  We will recheck this today.  With a free T4 and T3.  Disposition: Return in about 6 weeks (around 10/17/2020).  Medication Adjustments/Labs and Tests Ordered: Current medicines are reviewed at length with the patient today.  Concerns regarding medicines are outlined above.  Orders Placed This Encounter  Procedures  . Basic metabolic panel   Meds ordered this encounter  Medications  . apixaban (ELIQUIS) 5 MG TABS tablet    Sig: Take 1 tablet (5 mg total) by mouth 2 (two) times daily.    Dispense:  60 tablet    Refill:  3  . torsemide (DEMADEX) 20 MG tablet    Sig: Take 1 tablet (20 mg total) by mouth daily.    Dispense:  90 tablet    Refill:  3  . potassium chloride SA (KLOR-CON) 20 MEQ tablet    Sig: Take 1 tablet (20 mEq total) by mouth daily.    Dispense:  90 tablet    Refill:  3  . metoprolol succinate (TOPROL-XL) 100 MG 24 hr tablet    Sig: Take 1 tablet (100 mg total) by mouth daily. Take with or immediately following a meal.    Dispense:  90 tablet    Refill:  3    Patient Instructions  Medication Instructions:   INCREASE Torsemide to 40 mg daily for 5 days then resume 20 mg daily  *If you need a refill on your cardiac medications before your next appointment, please call your pharmacy*  Lab Work: Your physician recommends that you return for lab work TODAY:   BMET  If you have labs (blood work) drawn today and your tests are completely normal, you will receive your results only by: Marland Kitchen MyChart Message (if you have MyChart) OR . A paper copy in the mail If you have any lab test that is abnormal or we need to change your treatment, we will call  you to review the  results.  Testing/Procedures: NONE ordered at this time of appointment   Follow-Up: At [facility], you and your health needs are our priority.  As part of our continuing mission to provide you with exceptional heart care, we have created designated Provider Care Teams.  These Care Teams include your primary Cardiologist (physician) and Advanced Practice Providers (APPs -  Physician Assistants and Nurse Practitioners) who all work together to provide you with the care you need, when you need it.  Your next appointment:   6 week(s)  The format for your next appointment:   In Person  Provider:   Eleonore Chiquito, MD  Other Instructions  Your Physician recommends you exercise for 150 minutes a week with moderate exercises like light jogging, walking etc   Your Physician recommends looking into the [facility] [employer]      Time Spent with Patient: I have spent a total of 25 minutes with patient reviewing hospital notes, telemetry, EKGs, labs and examining the patient as well as establishing an assessment and plan that was discussed with the patient.  > 50% of time was spent in direct patient care.  Signed, Addison Naegeli. Audie Box, [place]  [address] [place], [place] [address] [phone]  09/05/2020 3:03 PM

## 2020-09-05 ENCOUNTER — Ambulatory Visit (INDEPENDENT_AMBULATORY_CARE_PROVIDER_SITE_OTHER): Payer: PRIVATE HEALTH INSURANCE | Admitting: Cardiovascular Disease

## 2020-09-05 ENCOUNTER — Other Ambulatory Visit: Payer: Self-pay

## 2020-09-05 ENCOUNTER — Encounter: Payer: Self-pay | Admitting: Cardiovascular Disease

## 2020-09-05 ENCOUNTER — Ambulatory Visit: Payer: PRIVATE HEALTH INSURANCE | Attending: Internal Medicine | Admitting: Physical Therapy

## 2020-09-05 ENCOUNTER — Encounter: Payer: Self-pay | Admitting: Physical Therapy

## 2020-09-05 VITALS — BP 108/50 | HR 46 | Ht 73.0 in | Wt 162.0 lb

## 2020-09-05 DIAGNOSIS — I5032 Chronic diastolic (congestive) heart failure: Secondary | ICD-10-CM | POA: Diagnosis not present

## 2020-09-05 DIAGNOSIS — I272 Pulmonary hypertension, unspecified: Secondary | ICD-10-CM | POA: Diagnosis not present

## 2020-09-05 DIAGNOSIS — M25611 Stiffness of right shoulder, not elsewhere classified: Secondary | ICD-10-CM | POA: Diagnosis not present

## 2020-09-05 DIAGNOSIS — I480 Paroxysmal atrial fibrillation: Secondary | ICD-10-CM | POA: Diagnosis not present

## 2020-09-05 DIAGNOSIS — E039 Hypothyroidism, unspecified: Secondary | ICD-10-CM

## 2020-09-05 DIAGNOSIS — M6281 Muscle weakness (generalized): Secondary | ICD-10-CM | POA: Insufficient documentation

## 2020-09-05 MED ORDER — METOPROLOL SUCCINATE ER 100 MG PO TB24
100.0000 mg | ORAL_TABLET | Freq: Every day | ORAL | 3 refills | Status: DC
Start: 2020-09-05 — End: 2022-12-11

## 2020-09-05 MED ORDER — TORSEMIDE 20 MG PO TABS
20.0000 mg | ORAL_TABLET | Freq: Every day | ORAL | 3 refills | Status: DC
Start: 2020-09-05 — End: 2020-10-23

## 2020-09-05 MED ORDER — POTASSIUM CHLORIDE CRYS ER 20 MEQ PO TBCR
20.0000 meq | EXTENDED_RELEASE_TABLET | Freq: Every day | ORAL | 3 refills | Status: DC
Start: 2020-09-05 — End: 2022-12-11

## 2020-09-05 MED ORDER — APIXABAN 5 MG PO TABS
5.0000 mg | ORAL_TABLET | Freq: Two times a day (BID) | ORAL | 3 refills | Status: DC
Start: 2020-09-05 — End: 2022-12-11

## 2020-09-05 NOTE — Therapy (Signed)
[place], [place], [address] Phone: [phone]   Fax:  [phone]  Physical Therapy Evaluation  Patient Details  Name: Chris Morales MRN: [id number] Date of Birth: [birth year] Referring Provider (PT): Jamse Arn, MD   Encounter Date: 09/05/2020   PT End of Session - 09/05/20 0917    Visit Number 1    Number of Visits 1    Authorization Type GENERIC [employer]ERCIAL    PT Start Time 0917    PT Stop Time 1000    PT Time Calculation (min) 43 min    Equipment Utilized During Treatment Gait belt    Activity Tolerance Patient tolerated treatment well    Behavior During Therapy [facility] for tasks assessed/performed           Past Medical History:  Diagnosis Date  . Dyspnea   . Hypothyroidism   . Prostate CA [facility])     Past Surgical History:  Procedure Laterality Date  . IR THORACENTESIS ASP PLEURAL SPACE W/IMG GUIDE  06/27/2020  . IR THORACENTESIS ASP PLEURAL SPACE W/IMG GUIDE  06/28/2020  . LAMINECTOMY Bilateral 02/20/2019   Procedure: CERVICAL LAMINECTOMY FOR TUMOR;  Surgeon: Earnie Larsson, MD;  Location: [place];  Service: Neurosurgery;  Laterality: Bilateral;  CERVICAL LAMINECTOMY FOR TUMOR  . LIGAMENT REPAIR Left     There were no vitals filed for this visit.    Subjective Assessment - 09/05/20 0918    Subjective Patient reports he was in the hospital, discharged on 07/19/2020, for cardiac issues. He reports that he has gotten better and stronger since discharge. Patient does note right shoulder problems, had x-ray in the hospital that indicated torn rotator cuff. Patient denies any pain currently. Patient reports he has no limitations with walking or standing, but is severely limited with his right shoulder motion and strength. He notes shoulder has been limited for years but is at its worse currently. 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 Patient does note shortness of breath and difficulty lying on his back due to CHF. Patient does have trouble sleeping because he has to sleep sitting up.    Pertinent History Right CHF, prostate cancer with bony metastasis, A-fib    Limitations Lifting;House hold activities    How long can you sit comfortably? No limitation    How long can you stand comfortably? No limitation    How long can you walk comfortably? No limitation    Diagnostic tests X-ray shoulder    Patient Stated Goals Patient reports he is doing well and agrees no further therapy needed at this time    Currently in Pain? No/denies              [facility] PT Assessment - 09/05/20 0001      Assessment   Medical Diagnosis Debility    Referring Provider (PT) Jamse Arn, MD    Onset Date/Surgical Date 07/12/20    Hand Dominance Right    Next MD Visit 09/05/2020    Prior Therapy PT in hospital      Precautions   Precautions None      Restrictions   Weight Bearing Restrictions No      Balance Screen   Has the patient fallen in the past 6 months No    Has the patient had a decrease in activity level because of a fear of falling?  No  Is the patient reluctant to leave their home because of a fear of falling?  No      Prior Function   Level of Independence Independent    Vocation Part time employment    Medical illustrator    Leisure None reported      Cognition   Overall Cognitive Status Within Functional Limits for tasks assessed      Observation/Other Assessments   Observations Patient appears in no apparent distress    Focus on Therapeutic Outcomes (FOTO)  61% functional status   predicted 69% functional status     Sensation   Light Touch Appears Intact      Coordination   Gross Motor Movements are Fluid and Coordinated Yes      Posture/Postural Control   Posture Comments Patient with slight rounded shoulder posture, able to correct when cued      ROM / Strength   AROM  / PROM / Strength AROM;PROM;Strength      AROM   Overall AROM Comments All other BUE/BLE AROM grossly WFL    AROM Assessment Site Shoulder    Right/Left Shoulder Right    Right Shoulder Flexion 30 Degrees   shrug   Right Shoulder ABduction 30 Degrees   shrug     PROM   Overall PROM Comments Patient exhibits right shoulder PROM grossly WFL compared to left      Strength   Overall Strength Comments Patient exhibits BLE strength grossly 4/5 MMT      Palpation   Spinal mobility Not assessed    Palpation comment Non-TTP      Special Tests   Other special tests None performed      Transfers   Transfers Independent with all Transfers      Ambulation/Gait   Ambulation/Gait Yes    Ambulation/Gait Assistance 7: Independent    Ambulation Distance (Feet) 675 Feet    Gait Comments Patient with decreased overall gait speed secondary to report of slight shortness of breath      6 minute walk test results    Aerobic Endurance Distance Walked 675    Endurance additional comments agre related norm: 1716      Standardized Balance Assessment   Standardized Balance Assessment Five Times Sit to Stand    Five times sit to stand comments  30 sec STS: 7   agre related norm: 17                     Objective measurements completed on examination: See above findings.       [facility] Adult PT Treatment/Exercise - 09/05/20 0001      Exercises   Exercises Shoulder      Shoulder Exercises: Seated   Other Seated Exercises Sit<>stand x 10      Shoulder Exercises: Stretch   Table Stretch - Flexion 5 reps    Table Stretch -Flexion Limitations performed seated and standing                  PT Education - 09/05/20 0917    Education Details Exam findings, POC, HEP    Person(s) Educated Patient    Methods Explanation;Demonstration;Verbal cues;Handout    Comprehension Verbalized understanding;Returned demonstration;Verbal cues required;Need further instruction             PT Short Term Goals - 09/05/20 [id number]      PT SHORT TERM GOAL #1   Title Patient will be independent with HEP    Status Achieved  Target Date 09/05/20                     Plan - 09/05/20 1012    Clinical Impression Statement Patient presents to PT following approximate 3-week hospital stay and referral for debility. Patient reports he is doing much better and does not feel limited in regard to his normal activity level. Patient does exhibit gross weakness and limitation with functional testing consisting of 30 sec STS test and 6MWT. Patient's main limitation seems to be related to right shoulder active motion and strength. 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 Based on discussions with patient he would not follow-up with OP ortho PT but was provided exercises to work on stretching right shoulder to maintain flexibility and LE strengthening. Patient encouraged to follow-up with cardiologist regarding referral to cardiac therapy if he thought i was indicated, and to follow-up with PCP regardind referral to orthopedic surgeon for right shoulder.    Personal Factors and Comorbidities Fitness;Time since onset of injury/illness/exacerbation;Past/Current Experience;Comorbidity 3+    Comorbidities Right CHF, prostate cancer with bony metastasis, A-fib    Examination-Activity Limitations Reach Overhead;Lift;Sleep    Examination-Participation Restrictions Community Activity    Stability/Clinical Decision Making Stable/Uncomplicated    Clinical Decision Making Low    PT Frequency One time visit    PT Treatment/Interventions ADLs/Self Care Home Management;Therapeutic exercise;Passive range of motion;Manual techniques;Therapeutic activities;Joint Manipulations    PT Next Visit Plan NA - one time visit    PT Home Exercise Plan [id number]: seated and standing shoulder elevation stretch, sit<>stand    Recommended Other Services Orthopedic referral for shoulder surgeon, cardiac  therapy    Consulted and Agree with Plan of Care Patient           Patient will benefit from skilled therapeutic intervention in order to improve the following deficits and impairments:  Decreased range of motion,Decreased strength,Decreased activity tolerance,Cardiopulmonary status limiting activity,Decreased endurance,Impaired UE functional use  Visit Diagnosis: Stiffness of right shoulder, not elsewhere classified  Muscle weakness (generalized)     Problem List Patient Active Problem List   Diagnosis Date Noted  . Acute on chronic diastolic CHF (congestive heart failure) ([place]) 07/13/2020  . Volume overload 07/12/2020  . Pulmonary hypertension ([place]) 07/12/2020  . Chronic right-sided CHF (congestive heart failure) ([place]) 07/12/2020  . Hypoalbuminemia 07/12/2020  . Hypothyroidism 07/12/2020  . Peripheral edema   . Shortness of breath   . Lower extremity edema   . Shoulder weakness   . Metastasis to bone ([place])   . Obstructive uropathy   . Cancer associated pain   . Acute on chronic anemia   . Atrial fibrillation ([place])   . Debility 07/05/2020  . Pleural effusion   . SOB (shortness of breath)   . Status post thoracentesis   . Palliative care by specialist   . Goals of care, counseling/discussion   . Encounter for hospice care discussion   . Full code status   . Atrial fibrillation with rapid ventricular response ([place]) 06/25/2020  . 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 Left-sided weakness 06/25/2020  . Foley catheter in place on admission 06/25/2020  . Pleural effusion, bilateral 06/25/2020  . Normocytic anemia 06/25/2020  . Elevated d-dimer 06/25/2020  . 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 Atrial fibrillation, chronic ([place]) 06/25/2020  . Malignant neoplasm of prostate metastatic to bone ([place]) 02/22/2019  . 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 AKI (acute kidney injury) ([place]) 02/20/2019  . Prostate CA ([place]) 02/20/2019  .  Cervical myelopathy (HCC) 02/19/2019     Rosana Hoes, PT, DPT, LAT, ATC 09/05/20  10:34 AM Phone: [phone] Fax: [phone]   [facility] [address] [place], [place], [address] Phone: [phone]   Fax:  [phone]  Name: Chris Morales MRN: [id number] Date of Birth: 1956-04-30

## 2020-09-05 NOTE — Patient Instructions (Addendum)
Medication Instructions:   INCREASE Torsemide to 40 mg daily for 5 days then resume 20 mg daily  *If you need a refill on your cardiac medications before your next appointment, please call your pharmacy*  Lab Work: Your physician recommends that you return for lab work TODAY:   BMET  TSH+FreeT4+T3Free  If you have labs (blood work) drawn today and your tests are completely normal, you will receive your results only by: Marland Kitchen MyChart Message (if you have MyChart) OR . A paper copy in the mail If you have any lab test that is abnormal or we need to change your treatment, we will call you to review the results.  Testing/Procedures: NONE ordered at this time of appointment   Follow-Up: At [facility], you and your health needs are our priority.  As part of our continuing mission to provide you with exceptional heart care, we have created designated Provider Care Teams.  These Care Teams include your primary Cardiologist (physician) and Advanced Practice Providers (APPs -  Physician Assistants and Nurse Practitioners) who all work together to provide you with the care you need, when you need it.  Your next appointment:   6 week(s)  The format for your next appointment:   In Person  Provider:   Lennie Odor, MD  Other Instructions  Your Physician recommends you exercise for 150 minutes a week with moderate exercises like light jogging, walking etc   Your Physician recommends looking into the [facility]

## 2020-09-05 NOTE — Patient Instructions (Signed)
Access Code: [email or web address] URL: [email or web address] Date: 09/05/2020 Prepared by: Rosana Hoes  Exercises Seated Shoulder Flexion Towel Slide at Table Top - 2-3 x daily - 5-10 reps Standing 'L' Stretch at Counter - 2-3 x daily - 5-10 reps Sit to Stand with Arms Crossed - 2-3 x daily - 10-15 reps

## 2020-09-06 LAB — BASIC METABOLIC PANEL
BUN/Creatinine Ratio: 28 — ABNORMAL HIGH (ref 10–24)
BUN: 30 mg/dL — ABNORMAL HIGH (ref 8–27)
CO2: 26 mmol/L (ref 20–29)
Calcium: 9.2 mg/dL (ref 8.6–10.2)
Chloride: 97 mmol/L (ref 96–106)
Creatinine, Ser: 1.06 mg/dL (ref 0.76–1.27)
GFR calc Af Amer: 85 mL/min/{1.73_m2} (ref >59)
GFR calc non Af Amer: 74 mL/min/{1.73_m2} (ref >59)
Glucose: 89 mg/dL (ref 65–99)
Potassium: 3.9 mmol/L (ref 3.5–5.2)
Sodium: 140 mmol/L (ref 134–144)

## 2020-09-06 LAB — TSH+T4F+T3FREE
Free T4: 1.44 ng/dL (ref 0.82–1.77)
T3, Free: 3 pg/mL (ref 2.0–4.4)
TSH: 3.19 u[IU]/mL (ref 0.450–4.500)

## 2020-09-10 ENCOUNTER — Other Ambulatory Visit: Payer: Self-pay

## 2020-09-10 MED ORDER — LEVOTHYROXINE SODIUM 25 MCG PO TABS: 25.0000 ug | ORAL_TABLET | Freq: Every day | ORAL | 0 refills | Status: DC

## 2020-09-14 ENCOUNTER — Other Ambulatory Visit: Payer: Self-pay | Admitting: Oncology

## 2020-09-17 ENCOUNTER — Telehealth: Payer: Self-pay | Admitting: Oncology

## 2020-09-17 NOTE — Telephone Encounter (Signed)
Attempted to contact regarding rescheduling appointments per 1/18 schedule message. Voicemail full. Rescheduled appointments and mailed updated calendar.

## 2020-09-23 ENCOUNTER — Other Ambulatory Visit: Payer: Self-pay | Admitting: Cardiovascular Disease

## 2020-09-27 ENCOUNTER — Ambulatory Visit: Payer: PRIVATE HEALTH INSURANCE | Admitting: Oncology

## 2020-09-27 ENCOUNTER — Other Ambulatory Visit: Payer: PRIVATE HEALTH INSURANCE

## 2020-09-29 ENCOUNTER — Other Ambulatory Visit: Payer: Self-pay | Admitting: Oncology

## 2020-10-15 ENCOUNTER — Inpatient Hospital Stay: Payer: PRIVATE HEALTH INSURANCE

## 2020-10-15 ENCOUNTER — Other Ambulatory Visit: Payer: Self-pay

## 2020-10-15 ENCOUNTER — Inpatient Hospital Stay: Payer: PRIVATE HEALTH INSURANCE | Attending: Oncology | Admitting: Oncology

## 2020-10-15 VITALS — BP 147/65 | HR 63 | Temp 96.9°F | Resp 18 | Ht 73.0 in | Wt 185.0 lb

## 2020-10-15 DIAGNOSIS — Z79899 Other long term (current) drug therapy: Secondary | ICD-10-CM | POA: Diagnosis not present

## 2020-10-15 DIAGNOSIS — M898X9 Other specified disorders of bone, unspecified site: Secondary | ICD-10-CM | POA: Diagnosis not present

## 2020-10-15 DIAGNOSIS — C7951 Secondary malignant neoplasm of bone: Secondary | ICD-10-CM | POA: Diagnosis not present

## 2020-10-15 DIAGNOSIS — C61 Malignant neoplasm of prostate: Secondary | ICD-10-CM | POA: Diagnosis present

## 2020-10-15 DIAGNOSIS — Z7901 Long term (current) use of anticoagulants: Secondary | ICD-10-CM | POA: Insufficient documentation

## 2020-10-15 LAB — CMP (CANCER CENTER ONLY)
ALT: 13 U/L (ref 0–44)
AST: 18 U/L (ref 15–41)
Albumin: 3.5 g/dL (ref 3.5–5.0)
Alkaline Phosphatase: 293 U/L — ABNORMAL HIGH (ref 38–126)
Anion gap: 8 (ref 5–15)
BUN: 17 mg/dL (ref 8–23)
CO2: 24 mmol/L (ref 22–32)
Calcium: 8.7 mg/dL — ABNORMAL LOW (ref 8.9–10.3)
Chloride: 109 mmol/L (ref 98–111)
Creatinine: 0.93 mg/dL (ref 0.61–1.24)
GFR, Estimated: >60 mL/min (ref >60)
Glucose, Bld: 87 mg/dL (ref 70–99)
Potassium: 4.2 mmol/L (ref 3.5–5.1)
Sodium: 141 mmol/L (ref 135–145)
Total Bilirubin: 1.1 mg/dL (ref 0.3–1.2)
Total Protein: 6.6 g/dL (ref 6.5–8.1)

## 2020-10-15 LAB — CBC WITH DIFFERENTIAL (CANCER CENTER ONLY)
Abs Immature Granulocytes: 0.02 10*3/uL (ref 0.00–0.07)
Basophils Absolute: 0.1 10*3/uL (ref 0.0–0.1)
Basophils Relative: 1 %
Eosinophils Absolute: 0.2 10*3/uL (ref 0.0–0.5)
Eosinophils Relative: 5 %
HCT: 35.6 % — ABNORMAL LOW (ref 39.0–52.0)
Hemoglobin: 11.5 g/dL — ABNORMAL LOW (ref 13.0–17.0)
Immature Granulocytes: 0 %
Lymphocytes Relative: 23 %
Lymphs Abs: 1.1 10*3/uL (ref 0.7–4.0)
MCH: 28.3 pg (ref 26.0–34.0)
MCHC: 32.3 g/dL (ref 30.0–36.0)
MCV: 87.5 fL (ref 80.0–100.0)
Monocytes Absolute: 0.7 10*3/uL (ref 0.1–1.0)
Monocytes Relative: 16 %
Neutro Abs: 2.6 10*3/uL (ref 1.7–7.7)
Neutrophils Relative %: 55 %
Platelet Count: 180 10*3/uL (ref 150–400)
RBC: 4.07 MIL/uL — ABNORMAL LOW (ref 4.22–5.81)
RDW: 17.8 % — ABNORMAL HIGH (ref 11.5–15.5)
WBC Count: 4.7 10*3/uL (ref 4.0–10.5)
nRBC: 0 % (ref 0.0–0.2)

## 2020-10-15 MED ORDER — LEUPROLIDE ACETATE (4 MONTH) 30 MG ~~LOC~~ KIT: 30.0000 mg | PACK | Freq: Once | SUBCUTANEOUS | Status: DC

## 2020-10-15 MED ORDER — LEUPROLIDE ACETATE (4 MONTH) 30 MG ~~LOC~~ KIT
PACK | SUBCUTANEOUS | Status: AC
  Filled 2020-10-15: qty 30

## 2020-10-15 NOTE — Progress Notes (Signed)
Hematology and Oncology Follow Up Visit  Chris Morales [id number] April 21, 1956 65 y.o. 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  His PSA was 270.   Prior Therapy:  He is status post C5 laminectomy completed completed in June 2020 and confirmed the prostate cancer metastatic diagnosis.  Current therapy:  Casodex 50 mg daily started in November 2021. Under evaluation to start androgen deprivation.  Interim History: Chris Morales returns today for a follow-up visit.  He is a pleasant gentleman I saw in consultation in November for advanced prostate cancer who has not been getting routine care for his cancer and was hospitalized again in October 2021.  He was started on Casodex and was scheduled to receive Eligard since the last visit.  This has not been scheduled and he is not sure why at this time.  Clinically, he feels reasonably well and has not had any cardiac issues.  He denies any worsening edema, shortness of breath or difficulty breathing.  He is eating better and has regained most activities of daily living.  He still spending time at 45 and [place] and travels regularly.   Medications: I have reviewed the patient's current medications.  Current Outpatient Medications  Medication Sig Dispense Refill  . apixaban (ELIQUIS) 5 MG TABS tablet Take 1 tablet (5 mg total) by mouth 2 (two) times daily. 60 tablet 3  . bicalutamide (CASODEX) 50 MG tablet TAKE 1 TABLET BY MOUTH EVERY DAY 90 tablet 1  . folic acid (FOLVITE) 1 MG tablet Take 1 tablet (1 mg total) by mouth daily. 30 tablet 0  . levothyroxine (SYNTHROID) 25 MCG tablet Take 1 tablet (25 mcg total) by mouth daily before breakfast. 30 tablet 0  . metoprolol succinate (TOPROL-XL) 100 MG 24 hr tablet Take 1 tablet (100 mg total) by mouth daily. Take with or immediately following a meal. 90 tablet 3  . potassium chloride SA  (KLOR-CON) 20 MEQ tablet Take 1 tablet (20 mEq total) by mouth daily. 90 tablet 3  . torsemide (DEMADEX) 20 MG tablet Take 1 tablet (20 mg total) by mouth daily. 90 tablet 3   No current facility-administered medications for this visit.     Allergies: No Known Allergies    Physical Exam: Blood pressure (!) 147/65, pulse 63, temperature (!) 96.9 F (36.1 C), temperature source Tympanic, resp. rate 18, height 6\' 1"  (1.854 m), weight 185 lb (83.9 kg), SpO2 100 %.  ECOG 1   General appearance: Alert, awake without any distress. Head: Atraumatic without abnormalities Oropharynx: Without any thrush or ulcers. Eyes: No scleral icterus. Lymph nodes: No lymphadenopathy noted in the cervical, supraclavicular, or axillary nodes Heart:regular rate and rhythm, without any murmurs or gallops.   Lung: Clear to auscultation without any rhonchi, wheezes or dullness to percussion. Abdomin: Soft, nontender without any shifting dullness or ascites. Musculoskeletal: No clubbing or cyanosis. Neurological: No motor or sensory deficits. Skin: No rashes or lesions.    Lab Results: Lab Results  Component Value Date   WBC 4.4 07/17/2020   HGB 9.5 (L) 07/17/2020   HCT 32.0 (L) 07/17/2020   MCV 97.6 07/17/2020   PLT 256 07/17/2020     Chemistry      Component Value Date/Time   NA 140 09/05/2020 1517   K 3.9 09/05/2020 1517   CL 97 09/05/2020 1517   CO2 26 09/05/2020 1517   BUN 30 (H) 09/05/2020 1517  CREATININE 1.06 09/05/2020 1517      Component Value Date/Time   CALCIUM 9.2 09/05/2020 1517   ALKPHOS 2,382 (H) 07/13/2020 0447   AST 16 07/13/2020 0447   ALT 17 07/13/2020 0447   BILITOT 1.1 07/13/2020 0447        Impression and Plan:   65 year old with:  1.    Castration-sensitive advanced prostate cancer with disease to the bone diagnosed in June 2020.  He has not been receiving regular treatment for his prostate cancer based on his wishes.  His disease status was updated  at this time and treatment options were reviewed.  Starting androgen deprivation therapy and subsequently escalation with enzalutamide or abiraterone or systemic chemotherapy were discussed.  I continue to emphasize the importance of adherence to prostate cancer care and follow-up appropriately.  Otherwise, his cancer will continue to progress because more mobility and eventually will lead to death.  He is agreeable to proceed with androgen deprivation therapy at this time.  I asked him to discontinue Casodex after 4 weeks.  Therapy escalation will be addressed after he has started androgen deprivation.   2.  Androgen deprivation: This remains the backbone of treating advanced prostate cancer and it is essential for this to be started in the near future.  3.  Bone directed therapy: He will require further directed therapy with Xgeva after obtaining dental clearance.  I recommended calcium and vitamin D supplements for the time being.  4.  Bone pain: No issues or exacerbation noted at this time.  5.  Prognosis and goals of care: Therapy remains palliative although aggressive measures are warranted given his reasonable performance status.   7.  Follow-up: We will be in the next few months to follow his progress.   30  minutes were spent on this encounter.  The time was dedicated to reviewing his disease status, discussing treatment options and complications related to his cancer and cancer therapy.   Zola Button, MD 2/15/202210:13 AM

## 2020-10-16 LAB — PROSTATE-SPECIFIC AG, SERUM (LABCORP): Prostate Specific Ag, Serum: 11.7 ng/mL — ABNORMAL HIGH (ref 0.0–4.0)

## 2020-10-20 NOTE — Progress Notes (Signed)
Cardiology Office Note:   Date:  10/23/2020  NAME:  Chris Morales    MRN: [id number] DOB:  1955-09-07   PCP:  Patient, No Pcp Per  Cardiologist:  Evalina Field, MD   Referring MD: No ref. provider found   Chief Complaint  Patient presents with  . Follow-up   History of Present Illness:   Chris Morales is a 65 y.o. male with a hx of persistent Afib, metastatic prostate CA, pHTN, HFpEF who presents for follow-up. Weight was 147 lbs when he left the hospital in November. Weights today 162 and stable.  He reports he is really working on his diet.  Examination shows he is still in regular rhythm.  He did buy the cardia mobile device.  No A. fib has been detected.  No bleeding on Eliquis.  His thyroid studies were abnormal in the hospital he was started on Synthroid.  We did stop this.  We will recheck thyroid studies today.  Blood pressure 116/70.  He is also started to exercise.  Walking on a treadmill every day for 30 minutes.  No issues with this.  Denies any chest pain or shortness of breath.  Doing well on torsemide 20 mg a day.  Regarding his cancer care seems to be stable.  He does have plans to continue with Dr. [name] as well as seek a second opinion at [facility].  Problem List 1. Metastatic prostate cancer 2. Paroxysmal atrial fibrillation -CHADSVASC=1 (CHF) -NSR 08/22/2020 3. Pleural effusions -exudative -Albumin 2.8 4. 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 HFpEF/RV failure  Past Medical History: Past Medical History:  Diagnosis Date  . Arrhythmia   . Dyspnea   . Hypothyroidism   . Prostate CA [facility])     Past Surgical History: Past Surgical History:  Procedure Laterality Date  . IR THORACENTESIS ASP PLEURAL SPACE W/IMG GUIDE  06/27/2020  . IR THORACENTESIS ASP PLEURAL SPACE W/IMG GUIDE  06/28/2020  . LAMINECTOMY Bilateral 02/20/2019   Procedure: CERVICAL LAMINECTOMY FOR TUMOR;  Surgeon: Earnie Larsson, MD;  Location: [facility];  Service: Neurosurgery;   Laterality: Bilateral;  CERVICAL LAMINECTOMY FOR TUMOR  . LIGAMENT REPAIR Left     Current Medications: Current Meds  Medication Sig  . apixaban (ELIQUIS) 5 MG TABS tablet Take 1 tablet (5 mg total) by mouth 2 (two) times daily.  . bicalutamide (CASODEX) 50 MG tablet TAKE 1 TABLET BY MOUTH EVERY DAY  . folic acid (FOLVITE) 1 MG tablet Take 1 tablet (1 mg total) by mouth daily.  . metoprolol succinate (TOPROL-XL) 100 MG 24 hr tablet Take 1 tablet (100 mg total) by mouth daily. Take with or immediately following a meal.  . potassium chloride SA (KLOR-CON) 20 MEQ tablet Take 1 tablet (20 mEq total) by mouth daily.  . [DISCONTINUED] torsemide (DEMADEX) 20 MG tablet Take 1 tablet (20 mg total) by mouth daily.     Allergies:    Patient has no known allergies.   Social History: Social History   Socioeconomic History  . Marital status: Divorced    Spouse name: Not on file  . Number of children: Not on file  . Years of education: Not on file  . Highest education level: Not on file  Occupational History  . Not on file  Tobacco Use  . Smoking status: Never Smoker  . Smokeless tobacco: Never Used  Vaping Use  . Vaping Use: Never used  Substance and Sexual Activity  . Alcohol use: Yes  . Drug use: No  .  Sexual activity: Not Currently  Other Topics Concern  . Not on file  Social History Narrative  . Not on file   Social Determinants of Health   Financial Resource Strain: Not on file  Food Insecurity: No Food Insecurity  . Worried About Charity fundraiser in the Last Year: Never true  . Ran Out of Food in the Last Year: Never true  Transportation Needs: No Transportation Needs  . Lack of Transportation (Medical): No  . Lack of Transportation (Non-Medical): No  Physical Activity: Not on file  Stress: Not on file  Social Connections: Not on file     Family History: The patient's family history includes Prostate cancer in his brother.  ROS:   All other ROS reviewed and  negative. Pertinent positives noted in the HPI.     EKGs/Labs/Other Studies Reviewed:   The following studies were personally reviewed by me today:   TTE 07/12/2020 1. Left ventricular ejection fraction, by estimation, is 60 to 65%. The  left ventricle has normal function. There is mild left ventricular  hypertrophy. Left ventricular diastolic parameters are indeterminate.  2. Right ventricular systolic function is normal. The right ventricular  size is mildly enlarged. There is mildly elevated pulmonary artery  systolic pressure. The estimated right ventricular systolic pressure is  16.3 mmHg. Mildly D-shaped  interventricular septum suggests elevated RV pressure.  3. Left atrial size was moderately dilated.  4. Right atrial size was moderately dilated.  5. The mitral valve is normal in structure. Trivial mitral valve  regurgitation. No evidence of mitral stenosis.  6. The aortic valve is tricuspid. Aortic valve regurgitation is not  visualized. Mild aortic valve sclerosis is present, with no evidence of  aortic valve stenosis.  7. The inferior vena cava is dilated in size with <50% respiratory  variability, suggesting right atrial pressure of 15 mmHg.  8. Significant left pleural effusion.    Recent Labs: 07/03/2020: Magnesium 2.1 07/12/2020: B Natriuretic Peptide 271.0 09/05/2020: TSH 3.190 10/15/2020: ALT 13; BUN 17; Creatinine 0.93; Hemoglobin 11.5; Platelet Count 180; Potassium 4.2; Sodium 141   Recent Lipid Panel    Component Value Date/Time   CHOL 132 06/25/2020 1546   TRIG 87 06/25/2020 1546   HDL 28 (L) 06/25/2020 1546   CHOLHDL 4.7 06/25/2020 1546   VLDL 17 06/25/2020 1546   LDLCALC 87 06/25/2020 1546    Physical Exam:   VS:  BP 116/80   Pulse (!) 52   Ht 6\' 1"  (1.854 m)   Wt 162 lb 9.6 oz (73.8 kg)   SpO2 95%   BMI 21.45 kg/m    Wt Readings from Last 3 Encounters:  10/23/20 162 lb 9.6 oz (73.8 kg)  10/15/20 185 lb (83.9 kg)  09/05/20 162 lb (73.5  kg)    General: Well nourished, well developed, in no acute distress Head: Atraumatic, normal size  Eyes: PEERLA, EOMI  Neck: Supple, JVD 7 8 cmH2O Endocrine: No thryomegaly Cardiac: Normal S1, S2; RRR; no murmurs, rubs, or gallops Lungs: Clear to auscultation bilaterally, no wheezing, rhonchi or rales  Abd: Soft, nontender, no hepatomegaly  Ext: Trace edema Musculoskeletal: No deformities, BUE and BLE strength normal and equal Skin: Warm and dry, no rashes   Neuro: Alert and oriented to person, place, time, and situation, CNII-XII grossly intact, no focal deficits  Psych: Normal mood and affect   ASSESSMENT:   Chris Morales is a 65 y.o. male who presents for the following: 1. Paroxysmal atrial fibrillation (HCC)  2. Chronic diastolic heart failure ([place])   3. Pulmonary hypertension ([place])   4. Hypothyroidism, unspecified type     PLAN:   1. Paroxysmal atrial fibrillation (HCC) -CHADSVASC=1 (CHF) -NSR 08/22/2020 -Developed atrial fibrillation in 2020 at the time of his cancer diagnosis.  He was treated aggressively for congestive heart failure.  He converted back to normal sinus rhythm on 08/22/2020.  He is maintaining sinus rhythm.  We discussed he will just continue metoprolol succinate moving forward.  He has a cardia mobile device.  No further recurrence of his A. Fib. -Only stroke risk factor of CHF.  However this will change when he turns 65.  No bleeding issues.  In the setting of malignancy and soon-to-be chads vasc score of 2 I favor anticoagulation.  He will continue Eliquis 5 mg twice daily.  2. Chronic diastolic heart failure (HCC) 3. Pulmonary hypertension ([place]) -Euvolemic on examination with trace edema.  He will continue torsemide 20 mg daily.  He is exercising.  His symptoms are well controlled with salt reductive strategies.  He is doing well.  4. Hypothyroidism, unspecified type -He was diagnosed with hypothyroidism in the hospital.  We did stop his Synthroid I  think this was euthyroid sick syndrome.  Recheck TSH, free T4 and T3 today.  He may need to go back on Synthroid.  We will adjust accordingly.  Disposition: Return in about 6 months (around 04/22/2021).  Medication Adjustments/Labs and Tests Ordered: Current medicines are reviewed at length with the patient today.  Concerns regarding medicines are outlined above.  Orders Placed This Encounter  Procedures  . TSH  . T4, free  . T3   Meds ordered this encounter  Medications  . torsemide (DEMADEX) 20 MG tablet    Sig: Take 1 tablet (20 mg total) by mouth daily.    Dispense:  90 tablet    Refill:  3    Patient Instructions  Medication Instructions:  The current medical regimen is effective;  continue present plan and medications.  *If you need a refill on your cardiac medications before your next appointment, please call your pharmacy*   Lab Work: TSH, Free T4, T3  If you have labs (blood work) drawn today and your tests are completely normal, you will receive your results only by: Marland Kitchen MyChart Message (if you have MyChart) OR . A paper copy in the mail If you have any lab test that is abnormal or we need to change your treatment, we will call you to review the results.   Follow-Up: At [facility], you and your health needs are our priority.  As part of our continuing mission to provide you with exceptional heart care, we have created designated Provider Care Teams.  These Care Teams include your primary Cardiologist (physician) and Advanced Practice Providers (APPs -  Physician Assistants and Nurse Practitioners) who all work together to provide you with the care you need, when you need it.  We recommend signing up for the patient portal called "MyChart".  Sign up information is provided on this After Visit Summary.  MyChart is used to connect with patients for Virtual Visits (Telemedicine).  Patients are able to view lab/test results, encounter notes, upcoming appointments, etc.   Non-urgent messages can be sent to your provider as well.   To learn more about what you can do with MyChart, go to [email or web address].    Your next appointment:   6 month(s)  The format for your next appointment:   In Person  Provider:   Eleonore Chiquito, MD        Time Spent with Patient: I have spent a total of 25 minutes with patient reviewing hospital notes, telemetry, EKGs, labs and examining the patient as well as establishing an assessment and plan that was discussed with the patient.  > 50% of time was spent in direct patient care.  Signed, Addison Naegeli. Audie Box, [place]  [address], [place], [place] [address] [phone]  10/23/2020 8:55 AM

## 2020-10-23 ENCOUNTER — Other Ambulatory Visit: Payer: Self-pay

## 2020-10-23 ENCOUNTER — Ambulatory Visit (INDEPENDENT_AMBULATORY_CARE_PROVIDER_SITE_OTHER): Payer: PRIVATE HEALTH INSURANCE | Admitting: Cardiovascular Disease

## 2020-10-23 ENCOUNTER — Encounter: Payer: Self-pay | Admitting: Cardiovascular Disease

## 2020-10-23 VITALS — BP 116/80 | HR 52 | Ht 73.0 in | Wt 162.6 lb

## 2020-10-23 DIAGNOSIS — E039 Hypothyroidism, unspecified: Secondary | ICD-10-CM | POA: Diagnosis not present

## 2020-10-23 DIAGNOSIS — I480 Paroxysmal atrial fibrillation: Secondary | ICD-10-CM

## 2020-10-23 DIAGNOSIS — I272 Pulmonary hypertension, unspecified: Secondary | ICD-10-CM

## 2020-10-23 DIAGNOSIS — I5032 Chronic diastolic (congestive) heart failure: Secondary | ICD-10-CM | POA: Diagnosis not present

## 2020-10-23 MED ORDER — TORSEMIDE 20 MG PO TABS: 20.0000 mg | ORAL_TABLET | Freq: Every day | ORAL | 3 refills | Status: DC

## 2020-10-23 NOTE — Patient Instructions (Signed)
Medication Instructions:  The current medical regimen is effective;  continue present plan and medications.  *If you need a refill on your cardiac medications before your next appointment, please call your pharmacy*   Lab Work: TSH, Free T4, T3  If you have labs (blood work) drawn today and your tests are completely normal, you will receive your results only by: Marland Kitchen MyChart Message (if you have MyChart) OR . A paper copy in the mail If you have any lab test that is abnormal or we need to change your treatment, we will call you to review the results.   Follow-Up: At [facility], you and your health needs are our priority.  As part of our continuing mission to provide you with exceptional heart care, we have created designated Provider Care Teams.  These Care Teams include your primary Cardiologist (physician) and Advanced Practice Providers (APPs -  Physician Assistants and Nurse Practitioners) who all work together to provide you with the care you need, when you need it.  We recommend signing up for the patient portal called "MyChart".  Sign up information is provided on this After Visit Summary.  MyChart is used to connect with patients for Virtual Visits (Telemedicine).  Patients are able to view lab/test results, encounter notes, upcoming appointments, etc.  Non-urgent messages can be sent to your provider as well.   To learn more about what you can do with MyChart, go to [email or web address].    Your next appointment:   6 month(s)  The format for your next appointment:   In Person  Provider:   Eleonore Chiquito, MD

## 2020-10-24 LAB — T3: T3, Total: 99 ng/dL (ref 71–180)

## 2020-10-24 LAB — TSH: TSH: 3.04 u[IU]/mL (ref 0.450–4.500)

## 2020-10-24 LAB — T4, FREE: Free T4: 1.41 ng/dL (ref 0.82–1.77)

## 2020-10-29 ENCOUNTER — Other Ambulatory Visit: Payer: Self-pay | Admitting: Oncology

## 2020-10-29 NOTE — Progress Notes (Addendum)
..  The following Medication: Eligard has been approved thru [employer] as Assistance Program. Enrollment period is 10/24/2020 to 08/30/2021. Reason for Assistance: Insurance Denial/Out of Network First DOS: 11/07/2020.

## 2020-11-07 ENCOUNTER — Inpatient Hospital Stay: Payer: PRIVATE HEALTH INSURANCE | Attending: Oncology

## 2020-11-07 ENCOUNTER — Other Ambulatory Visit: Payer: Self-pay

## 2020-11-07 VITALS — BP 139/72 | HR 59 | Temp 98.7°F | Resp 16

## 2020-11-07 DIAGNOSIS — Z5111 Encounter for antineoplastic chemotherapy: Secondary | ICD-10-CM | POA: Insufficient documentation

## 2020-11-07 DIAGNOSIS — C7951 Secondary malignant neoplasm of bone: Secondary | ICD-10-CM | POA: Diagnosis not present

## 2020-11-07 DIAGNOSIS — C61 Malignant neoplasm of prostate: Secondary | ICD-10-CM | POA: Diagnosis present

## 2020-11-07 MED ORDER — LEUPROLIDE ACETATE (4 MONTH) 30 MG ~~LOC~~ KIT
30.0000 mg | PACK | Freq: Once | SUBCUTANEOUS | Status: AC
  Administered 2020-11-07: 30 mg via SUBCUTANEOUS

## 2020-11-07 NOTE — Patient Instructions (Signed)

## 2020-11-08 ENCOUNTER — Ambulatory Visit: Payer: PRIVATE HEALTH INSURANCE

## 2020-12-25 ENCOUNTER — Telehealth: Payer: Self-pay | Admitting: *Deleted

## 2020-12-25 NOTE — Telephone Encounter (Signed)
PC from Dr. Bernarda Caffey at [facility] in [place] requesting records on this patient, Authorization for Release of Information received.  This MD spoke with Dr. [name] by phone regarding this mutual patient.  Records faxed.

## 2021-01-03 ENCOUNTER — Telehealth: Payer: Self-pay | Admitting: *Deleted

## 2021-01-03 NOTE — Telephone Encounter (Signed)
PC received from Blanch Media, NP at [facility], states this patient is there for back surgery and wants to continue his oncology tx plan in [place].  NP requesting medical records, instructed her to fax release of medical information form to [phone]. She verbalizes understanding.

## 2021-02-12 ENCOUNTER — Inpatient Hospital Stay: Payer: PRIVATE HEALTH INSURANCE | Attending: Oncology

## 2021-02-12 ENCOUNTER — Ambulatory Visit: Payer: PRIVATE HEALTH INSURANCE

## 2021-02-12 ENCOUNTER — Inpatient Hospital Stay: Payer: PRIVATE HEALTH INSURANCE | Admitting: Oncology

## 2021-03-06 ENCOUNTER — Telehealth: Payer: Self-pay

## 2021-03-06 NOTE — Telephone Encounter (Signed)
Attempted to call patient back regarding prior message. Unable to leave message as voicemail box is full. Will attempt to call back at another time.

## 2021-03-14 ENCOUNTER — Inpatient Hospital Stay: Payer: PRIVATE HEALTH INSURANCE | Attending: Oncology

## 2021-07-01 DEATH — deceased

## 2021-07-31 DEATH — deceased

## 2021-08-11 NOTE — Progress Notes (Signed)
..  Patient Assist/Replace for the following has been terminated. Medication: Eligard (leuprolide acetate)  Reason for Termination: No treatment since 11/07/2020, patient no longer living in [place], assistance no longer needed.  Last DOS: 11/07/2020.  Marland KitchenJuan Quam, CPhT IV Drug Replacement Specialist [place] Phone: [phone]

## 2022-04-23 IMAGING — CT CT ANGIO CHEST
2 of 6 series · 19 of 36 positions shown · IV contrast (omnipaque)
Comparison: Radiograph same day

CLINICAL DATA: Worsening shortness of breath

EXAM:
CT ANGIOGRAPHY CHEST WITH CONTRAST
TECHNIQUE: Multidetector CT imaging of the chest was performed using the
standard protocol during bolus administration of intravenous
contrast. Multiplanar CT image reconstructions and MIPs were
obtained to evaluate the vascular anatomy.
CONTRAST:  100mL OMNIPAQUE IOHEXOL 350 MG/ML SOLN

[Series 7: pe thins · axial · 0.75mm/px · z∈[-287,+17]mm · 18 of 484 slices shown]
[im 25/484  lung]
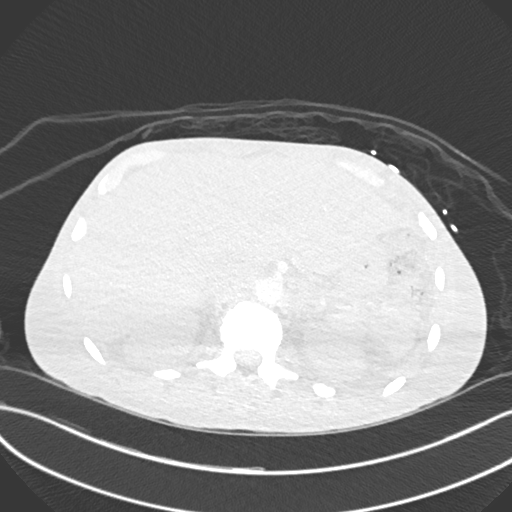
[im 49/484  mediastinal]
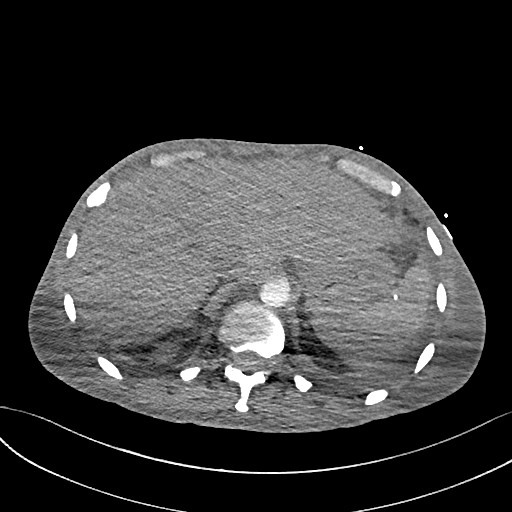
[im 73/484  lung]
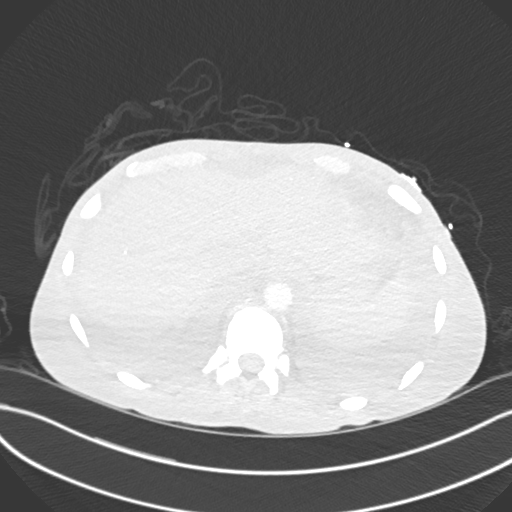
[im 97/484  mediastinal]
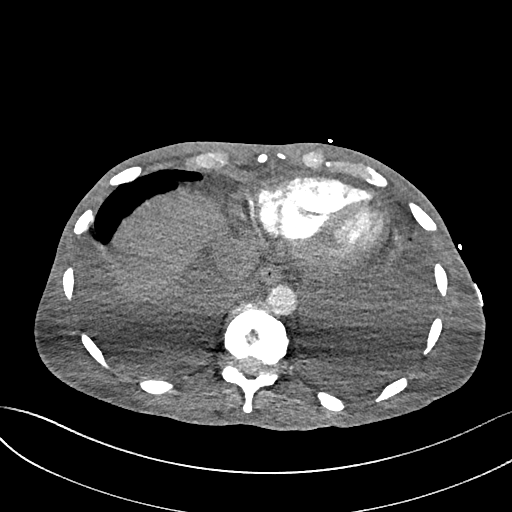
[im 121/484  lung]
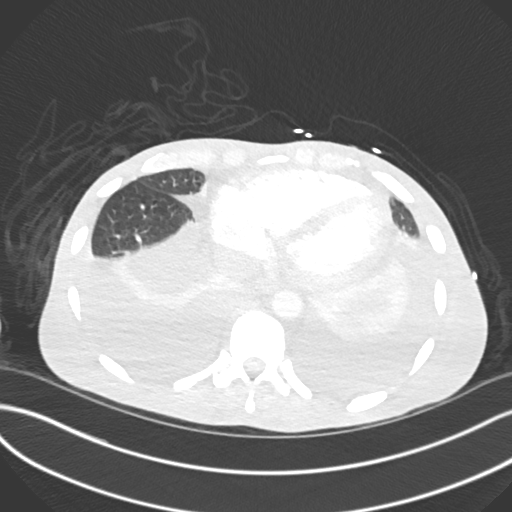
[im 145/484  mediastinal]
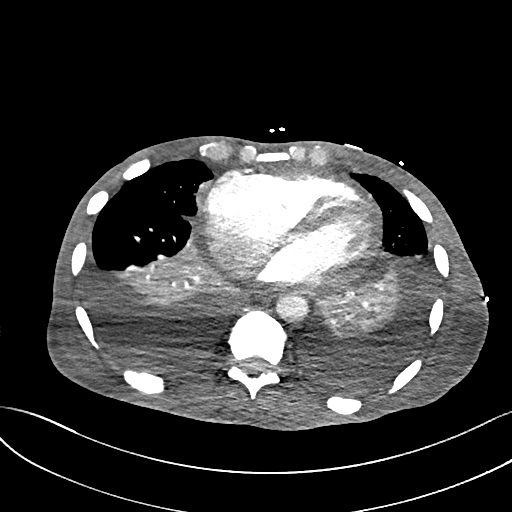
[im 170/484  lung]
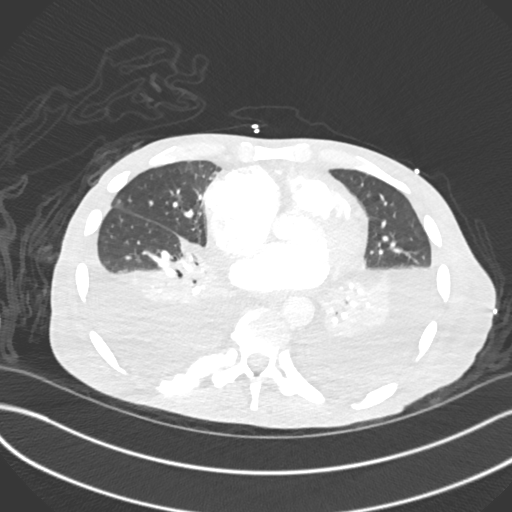
[im 194/484  mediastinal]
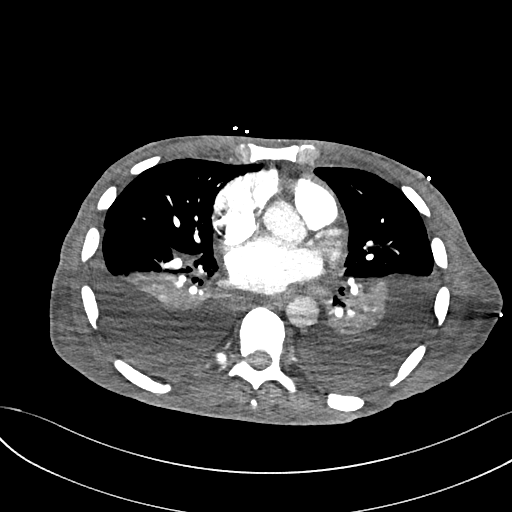
[im 218/484  lung]
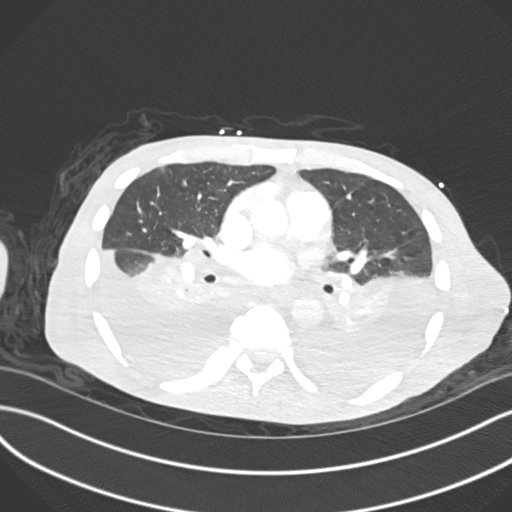
[im 266/484  mediastinal]
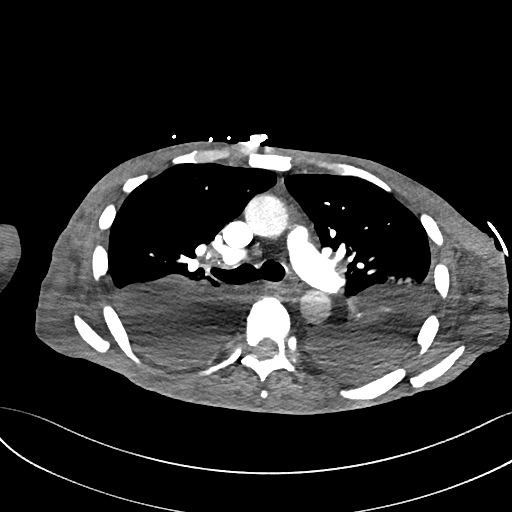
[im 290/484  lung]
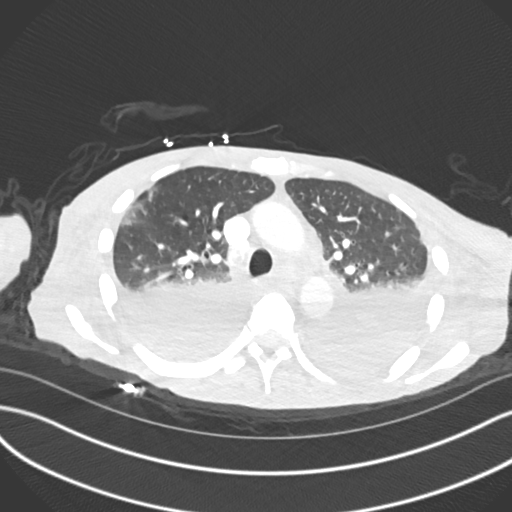
[im 314/484  mediastinal]
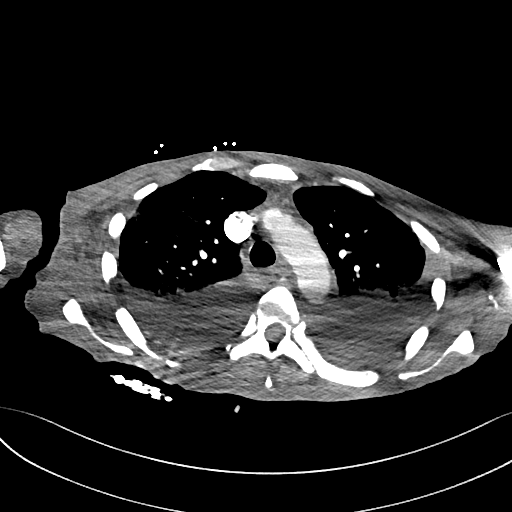
[im 339/484  lung]
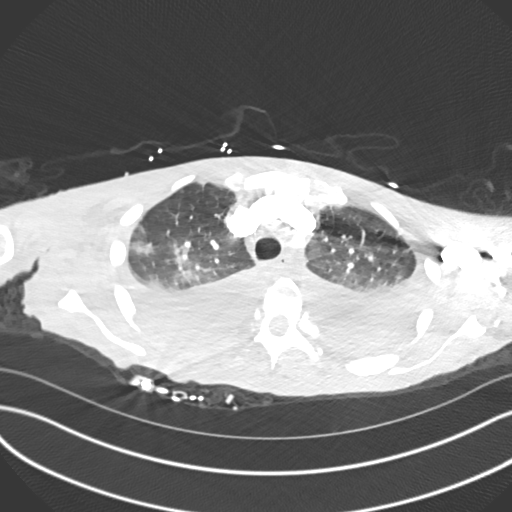
[im 363/484  mediastinal]
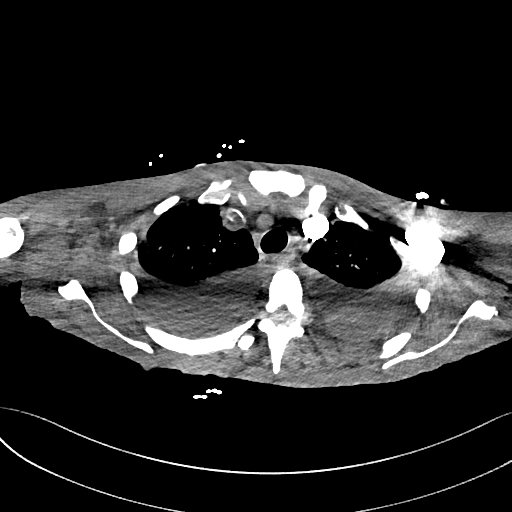
[im 387/484  lung]
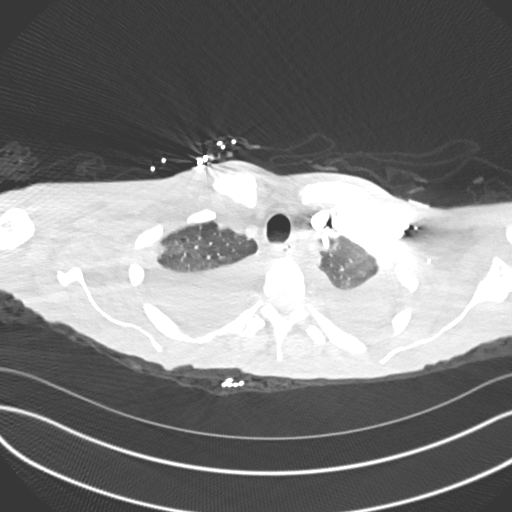
[im 411/484  mediastinal]
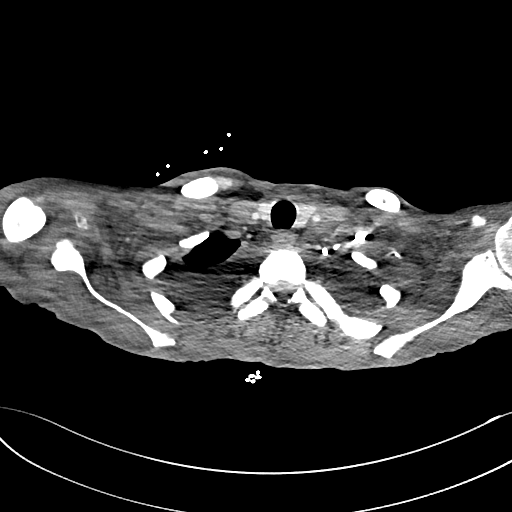
[im 435/484  lung]
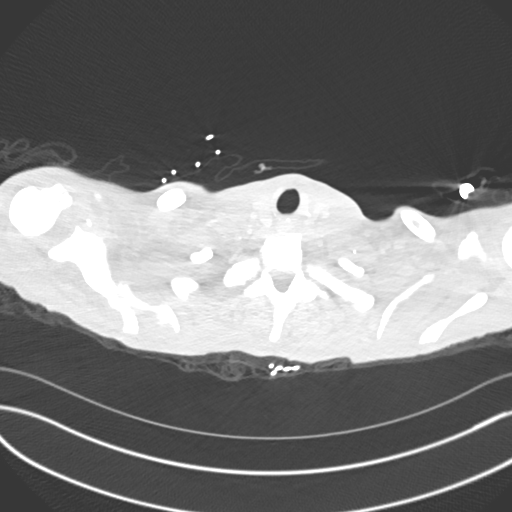
[im 459/484  mediastinal]
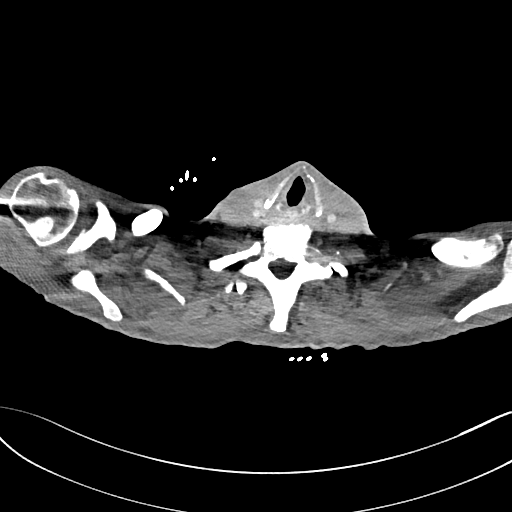

[Series 8: pe 2mm cor · coronal · 0.66mm/px · 1 of 109 slices shown]
[im 55/109  mediastinal]
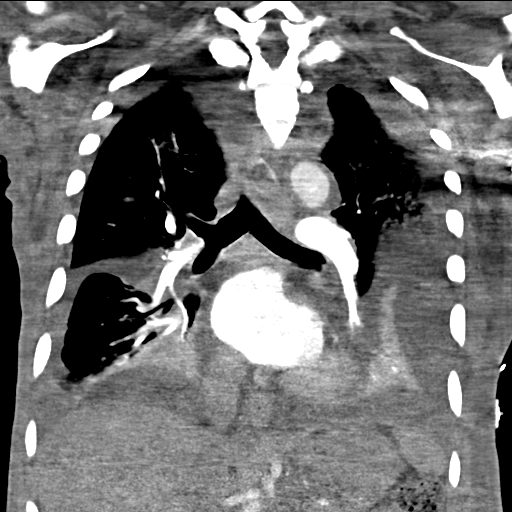

[19 of 36 positions shown; findings below may reference images not displayed]

FINDINGS: Cardiovascular: There is a optimal opacification of the pulmonary
arteries. There is no central,segmental, or subsegmental filling
defects within the pulmonary arteries. There is mild cardiomegaly
present. No pericardial effusion or thickening. No evidence right
heart strain. There is normal three-vessel brachiocephalic anatomy
without proximal stenosis. The thoracic aorta is normal in
appearance.

Mediastinum/Nodes: No hilar, mediastinal, or axillary adenopathy.
Thyroid gland, trachea, and esophagus demonstrate no significant
findings.

Lungs/Pleura: There is moderate to large bilateral pleural effusion
with adjacent compressive atelectasis seen. Patchy rounded
ground-glass opacities are seen predominantly within the right upper
lung and right middle lobe.

Upper Abdomen: No acute abnormalities present in the visualized
portions of the upper abdomen. There is diffuse anasarca present.

Musculoskeletal: Extensive osseous sclerotic blastic lesions are
seen throughout the visualized portion of the axial and appendicular
skeleton.

Review of the MIP images confirms the above findings.
IMPRESSION: No central, segmental, or subsegmental pulmonary embolism

Moderate to large bilateral pleural effusions with adjacent
compressive atelectasis.

Ground-glass opacities within the right upper lung and right middle
lobe, likely due to infectious or inflammatory etiology.

Extensive diffuse osseous metastatic disease.
